# Patient Record
Sex: Female | Born: 1940 | Race: Black or African American | Hispanic: No | Marital: Married | Smoking: Former smoker
Health system: Southern US, Community
[De-identification: ages and names within clinical notes are randomized; demographics above are authoritative.]

## PROBLEM LIST (undated history)

## (undated) DIAGNOSIS — G40909 Epilepsy, unspecified, not intractable, without status epilepticus: Secondary | ICD-10-CM

## (undated) DIAGNOSIS — K921 Melena: Secondary | ICD-10-CM

## (undated) DIAGNOSIS — I10 Essential (primary) hypertension: Secondary | ICD-10-CM

## (undated) DIAGNOSIS — C801 Malignant (primary) neoplasm, unspecified: Secondary | ICD-10-CM

## (undated) DIAGNOSIS — H269 Unspecified cataract: Secondary | ICD-10-CM

## (undated) DIAGNOSIS — R7303 Prediabetes: Secondary | ICD-10-CM

## (undated) DIAGNOSIS — Z860100 Personal history of colon polyps, unspecified: Secondary | ICD-10-CM

## (undated) DIAGNOSIS — Z973 Presence of spectacles and contact lenses: Secondary | ICD-10-CM

## (undated) DIAGNOSIS — K759 Inflammatory liver disease, unspecified: Secondary | ICD-10-CM

## (undated) DIAGNOSIS — R011 Cardiac murmur, unspecified: Secondary | ICD-10-CM

## (undated) DIAGNOSIS — R079 Chest pain, unspecified: Secondary | ICD-10-CM

## (undated) DIAGNOSIS — R51 Headache: Secondary | ICD-10-CM

## (undated) DIAGNOSIS — Z803 Family history of malignant neoplasm of breast: Secondary | ICD-10-CM

## (undated) DIAGNOSIS — E785 Hyperlipidemia, unspecified: Secondary | ICD-10-CM

## (undated) HISTORY — DX: Cardiac murmur, unspecified: R01.1

## (undated) HISTORY — DX: Prediabetes: R73.03

## (undated) HISTORY — PX: TONSILLECTOMY: SUR1361

## (undated) HISTORY — DX: Epilepsy, unspecified, not intractable, without status epilepticus: G40.909

## (undated) HISTORY — DX: Family history of malignant neoplasm of breast: Z80.3

## (undated) HISTORY — DX: Personal history of colonic polyps: Z86.010

## (undated) HISTORY — PX: BUNIONECTOMY: SHX129

## (undated) HISTORY — DX: Headache: R51

## (undated) HISTORY — DX: Essential (primary) hypertension: I10

## (undated) HISTORY — DX: Melena: K92.1

## (undated) HISTORY — DX: Inflammatory liver disease, unspecified: K75.9

## (undated) HISTORY — DX: Personal history of colon polyps, unspecified: Z86.0100

## (undated) HISTORY — PX: TUBAL LIGATION: SHX77

## (undated) HISTORY — PX: ABDOMINAL HERNIA REPAIR: SHX539

## (undated) HISTORY — DX: Hyperlipidemia, unspecified: E78.5

## (undated) HISTORY — DX: Chest pain, unspecified: R07.9

---

## 1997-07-31 ENCOUNTER — Other Ambulatory Visit: Admission: RE | Admit: 1997-07-31 | Discharge: 1997-07-31 | Payer: Self-pay | Admitting: Gynecology

## 1997-11-20 ENCOUNTER — Other Ambulatory Visit: Admission: RE | Admit: 1997-11-20 | Discharge: 1997-11-20 | Payer: Self-pay | Admitting: Gynecology

## 1999-01-02 ENCOUNTER — Other Ambulatory Visit: Admission: RE | Admit: 1999-01-02 | Discharge: 1999-01-02 | Payer: Self-pay | Admitting: Gynecology

## 1999-11-04 ENCOUNTER — Encounter: Payer: Self-pay | Admitting: Neurology

## 1999-11-04 ENCOUNTER — Other Ambulatory Visit: Admission: RE | Admit: 1999-11-04 | Discharge: 1999-11-04 | Payer: Self-pay | Admitting: Gynecology

## 1999-11-04 ENCOUNTER — Ambulatory Visit (HOSPITAL_COMMUNITY): Admission: RE | Admit: 1999-11-04 | Discharge: 1999-11-04 | Payer: Self-pay | Admitting: Neurology

## 1999-12-16 ENCOUNTER — Encounter: Payer: Self-pay | Admitting: Gynecology

## 1999-12-16 ENCOUNTER — Encounter: Admission: RE | Admit: 1999-12-16 | Discharge: 1999-12-16 | Payer: Self-pay | Admitting: Gynecology

## 2000-03-02 ENCOUNTER — Other Ambulatory Visit: Admission: RE | Admit: 2000-03-02 | Discharge: 2000-03-02 | Payer: Self-pay | Admitting: Gynecology

## 2000-12-17 ENCOUNTER — Encounter: Admission: RE | Admit: 2000-12-17 | Discharge: 2000-12-17 | Payer: Self-pay | Admitting: Gynecology

## 2000-12-17 ENCOUNTER — Encounter: Payer: Self-pay | Admitting: Gynecology

## 2001-05-10 ENCOUNTER — Other Ambulatory Visit: Admission: RE | Admit: 2001-05-10 | Discharge: 2001-05-10 | Payer: Self-pay | Admitting: Gynecology

## 2001-12-29 ENCOUNTER — Encounter: Admission: RE | Admit: 2001-12-29 | Discharge: 2001-12-29 | Payer: Self-pay | Admitting: Gynecology

## 2001-12-29 ENCOUNTER — Encounter: Payer: Self-pay | Admitting: Gynecology

## 2002-09-21 ENCOUNTER — Observation Stay (HOSPITAL_COMMUNITY): Admission: EM | Admit: 2002-09-21 | Discharge: 2002-09-22 | Payer: Self-pay | Admitting: Emergency Medicine

## 2002-09-21 ENCOUNTER — Encounter: Payer: Self-pay | Admitting: Emergency Medicine

## 2003-01-03 ENCOUNTER — Encounter: Admission: RE | Admit: 2003-01-03 | Discharge: 2003-01-03 | Payer: Self-pay | Admitting: Gynecology

## 2003-01-03 ENCOUNTER — Encounter: Payer: Self-pay | Admitting: Gynecology

## 2003-10-01 ENCOUNTER — Other Ambulatory Visit: Admission: RE | Admit: 2003-10-01 | Discharge: 2003-10-01 | Payer: Self-pay | Admitting: Gynecology

## 2004-01-09 ENCOUNTER — Encounter: Admission: RE | Admit: 2004-01-09 | Discharge: 2004-01-09 | Payer: Self-pay | Admitting: Gynecology

## 2005-01-06 ENCOUNTER — Emergency Department (HOSPITAL_COMMUNITY): Admission: EM | Admit: 2005-01-06 | Discharge: 2005-01-07 | Payer: Self-pay | Admitting: Emergency Medicine

## 2005-01-06 IMAGING — CR DG CHEST 2V
2 series · 2 of 2 positions shown · non-contrast
Comparison: None.

CLINICAL DATA: Left chest pain and tingling.

CHEST - 2 VIEW  [DATE]:

[view not recorded (1 of 2)]
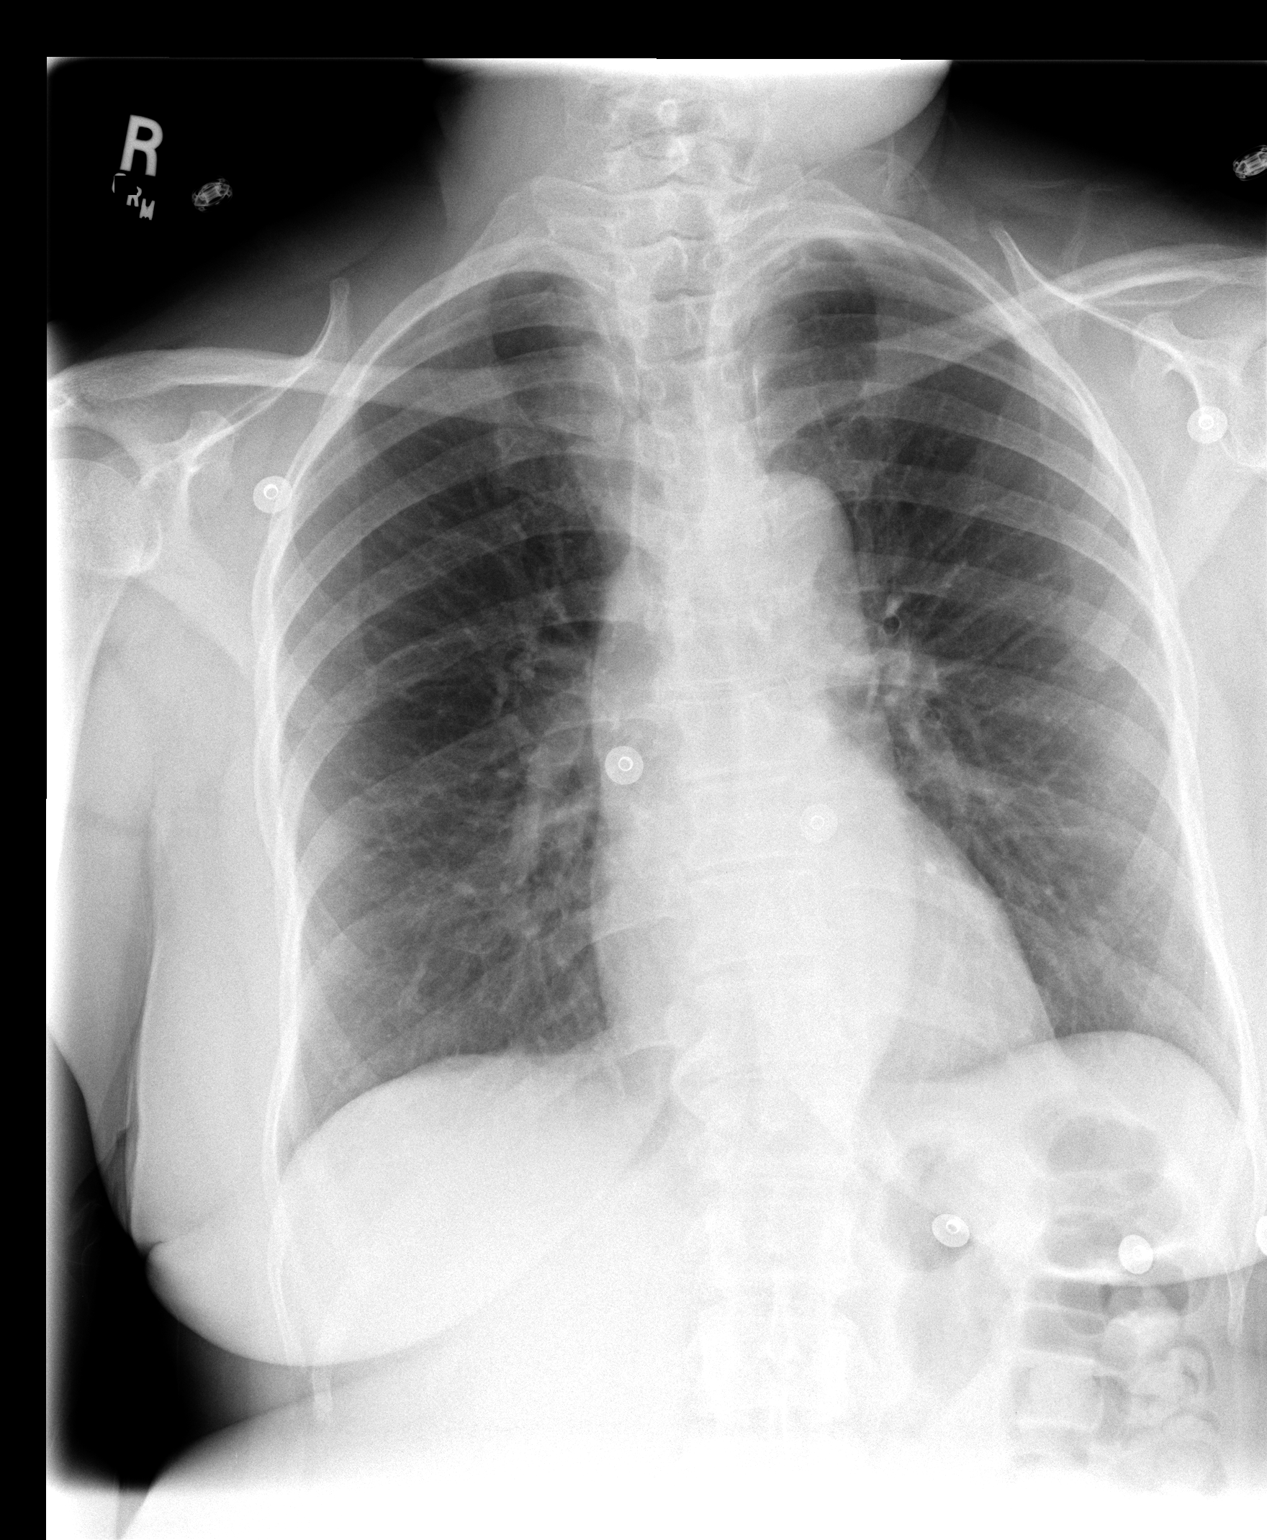

[view not recorded (2 of 2)]
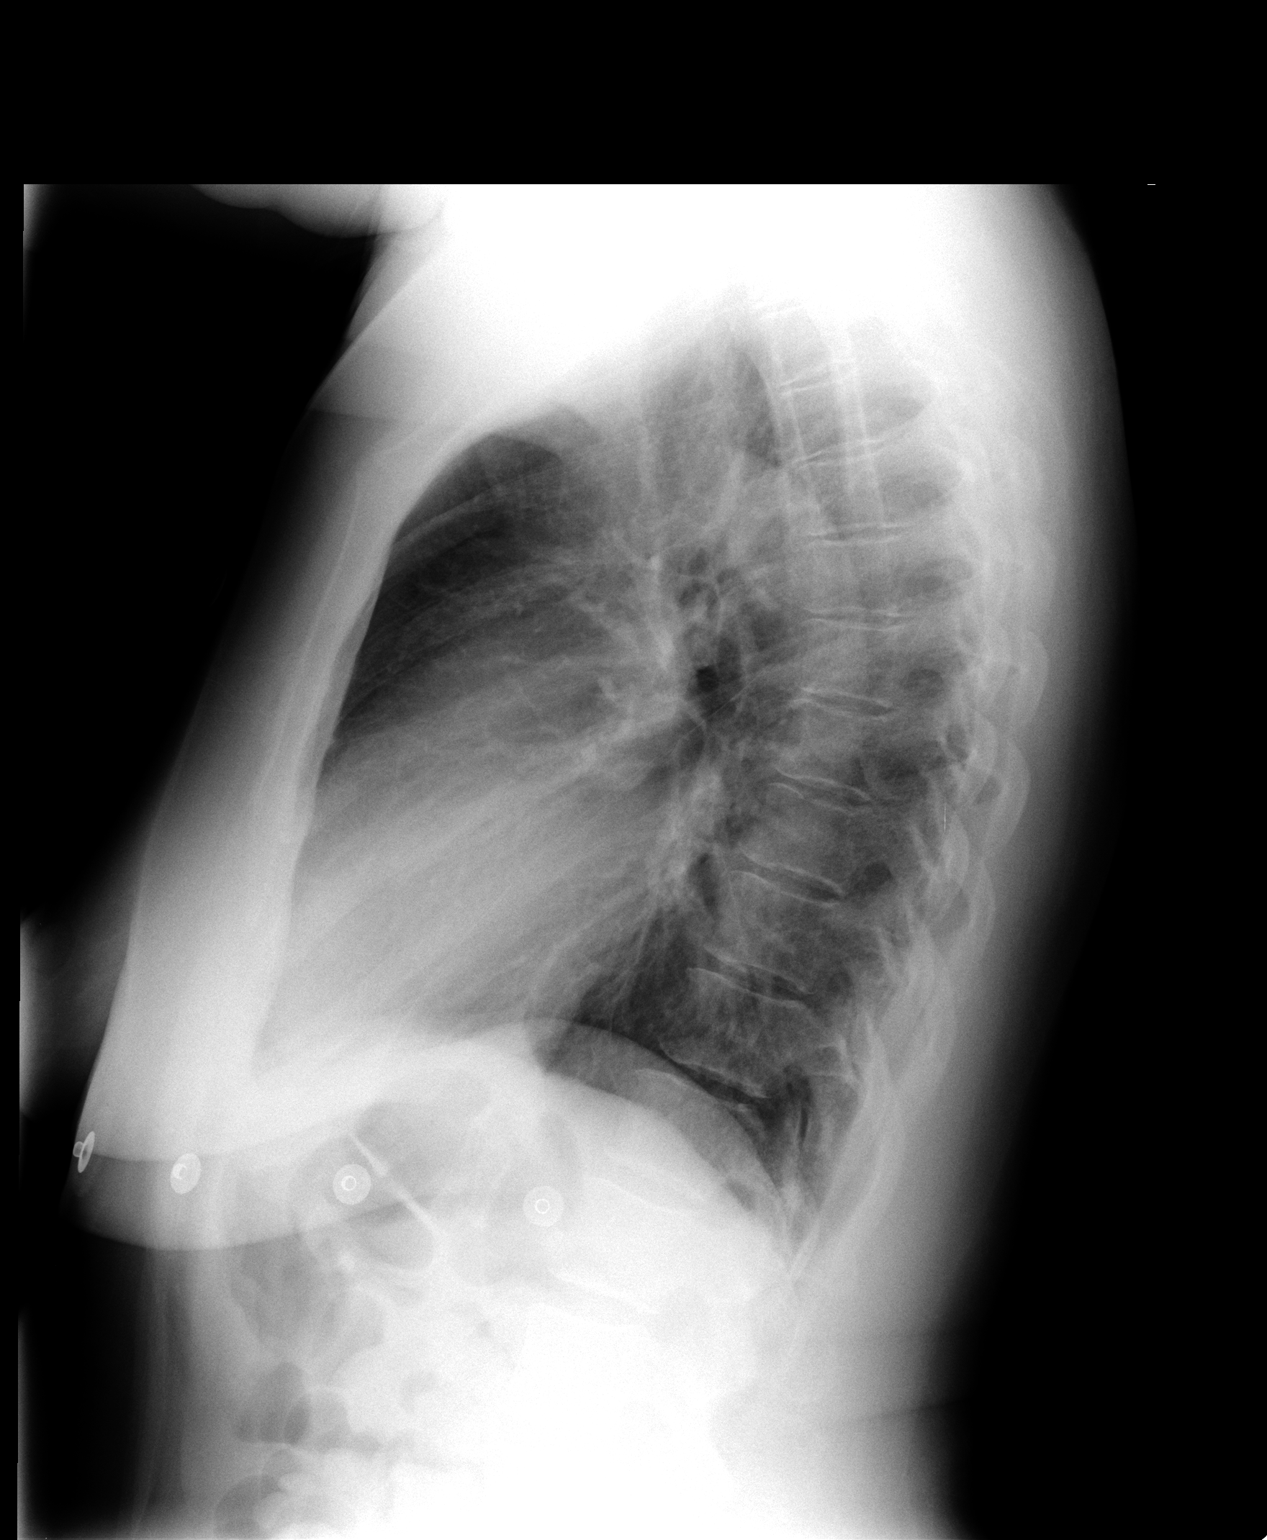

[2 of 2 positions shown; findings below may reference images not displayed]

FINDINGS: The heart size is normal. The thoracic aorta is mildly tortuous. The
hilar and mediastinal contours are otherwise unremarkable. The lungs appear
clear. There are no pleural effusions. Mild degenerative changes are present in
the midthoracic spine.
IMPRESSION: No evidence of acute disease.

## 2005-01-22 ENCOUNTER — Encounter: Admission: RE | Admit: 2005-01-22 | Discharge: 2005-01-22 | Payer: Self-pay | Admitting: Gynecology

## 2005-02-04 ENCOUNTER — Ambulatory Visit: Payer: Self-pay | Admitting: Cardiology

## 2005-03-09 ENCOUNTER — Other Ambulatory Visit: Admission: RE | Admit: 2005-03-09 | Discharge: 2005-03-09 | Payer: Self-pay | Admitting: Gynecology

## 2006-01-29 ENCOUNTER — Ambulatory Visit: Payer: Self-pay | Admitting: Cardiology

## 2006-02-18 ENCOUNTER — Encounter: Admission: RE | Admit: 2006-02-18 | Discharge: 2006-02-18 | Payer: Self-pay | Admitting: Gynecology

## 2006-02-18 IMAGING — MG MM SCREEN MAMMOGRAM BILATERAL
4 series · 4 of 4 positions shown · non-contrast
Comparison: none

DG SCREEN MAMMOGRAM BILATERAL
Bilateral CC and MLO view(s) were taken.
Prior study comparison: [DATE], bilateral screening mammogram.

DIGITAL SCREENING MAMMOGRAM WITH CAD:
There are scattered fibroglandular densities.  There is no dominant mass, architectural distortion 
or calcification to suggest malignancy.

[R CC]
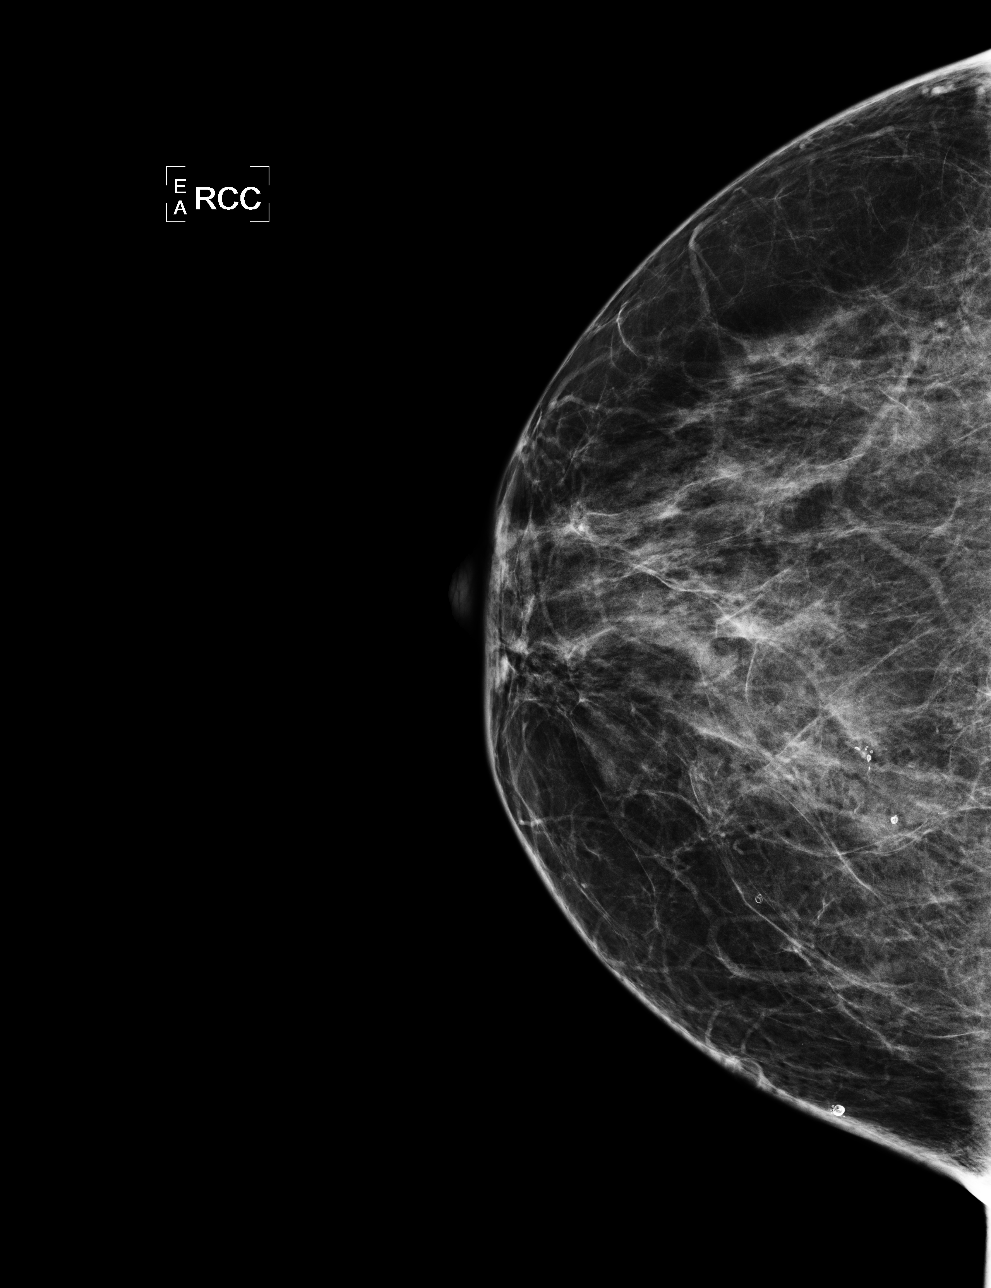

[L CC]
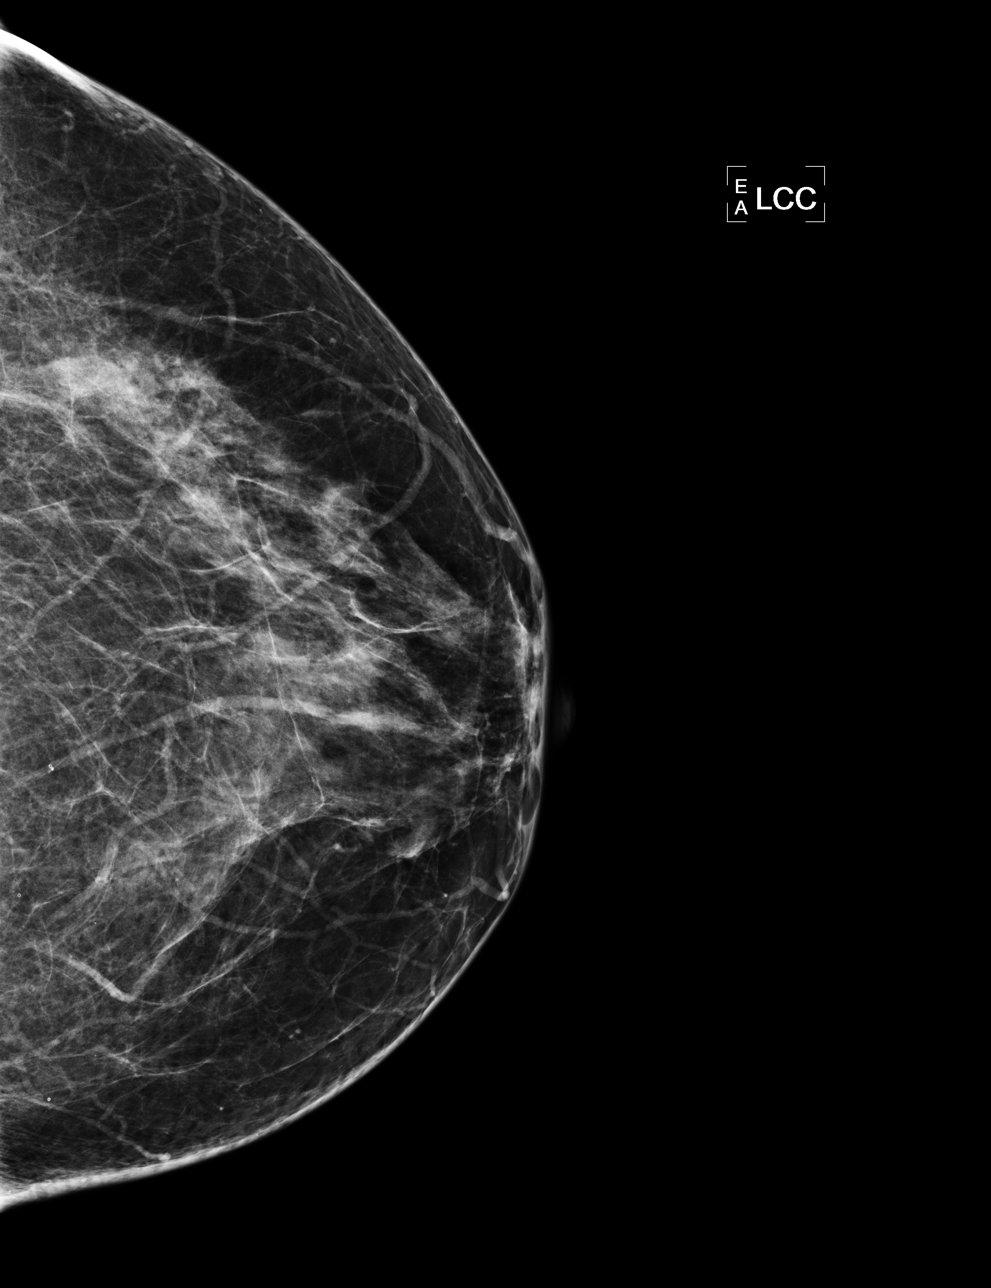

[L MLO]
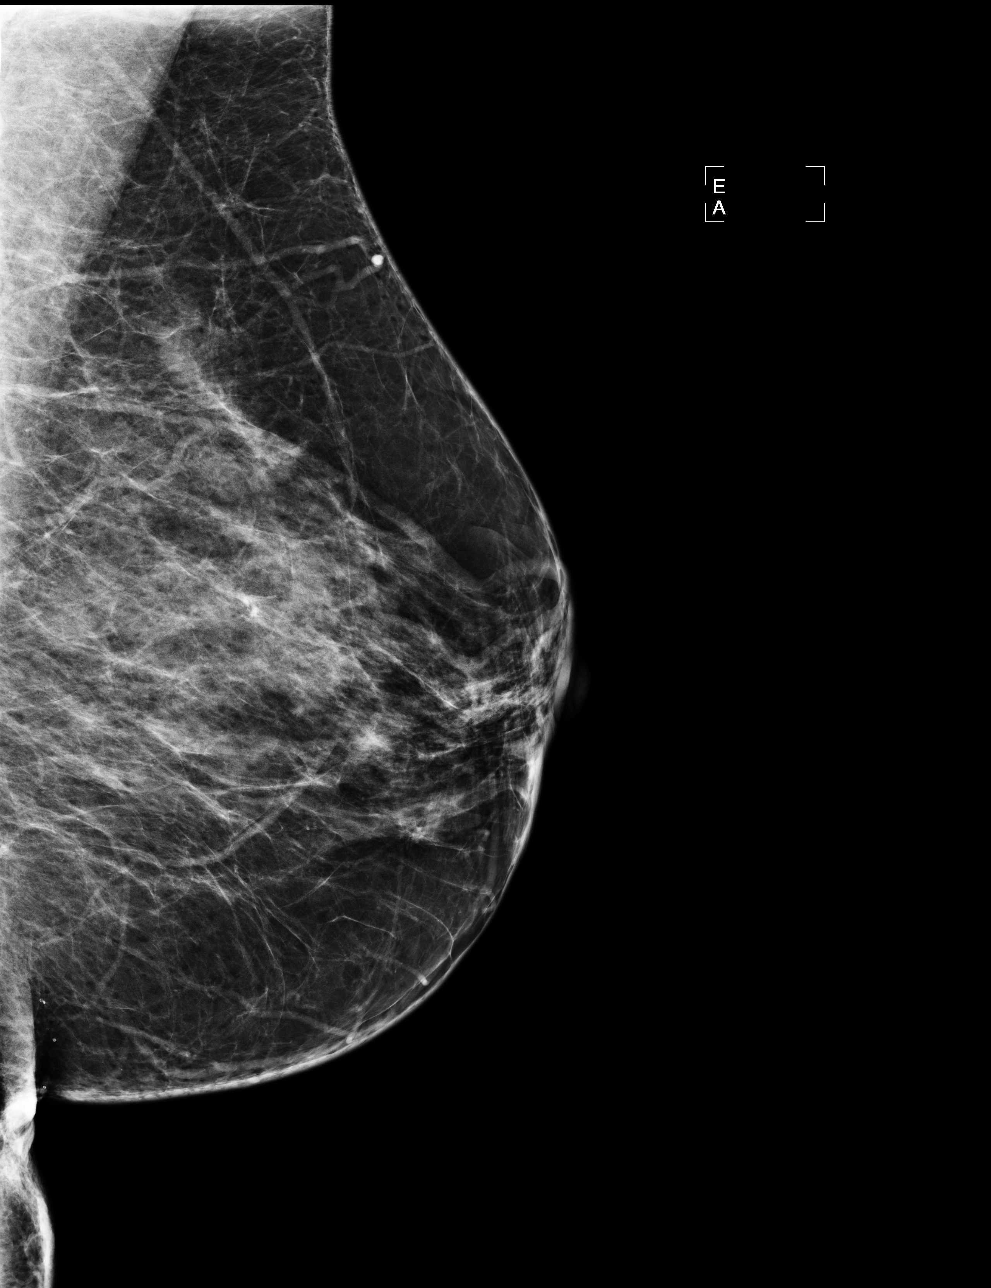

[R MLO]
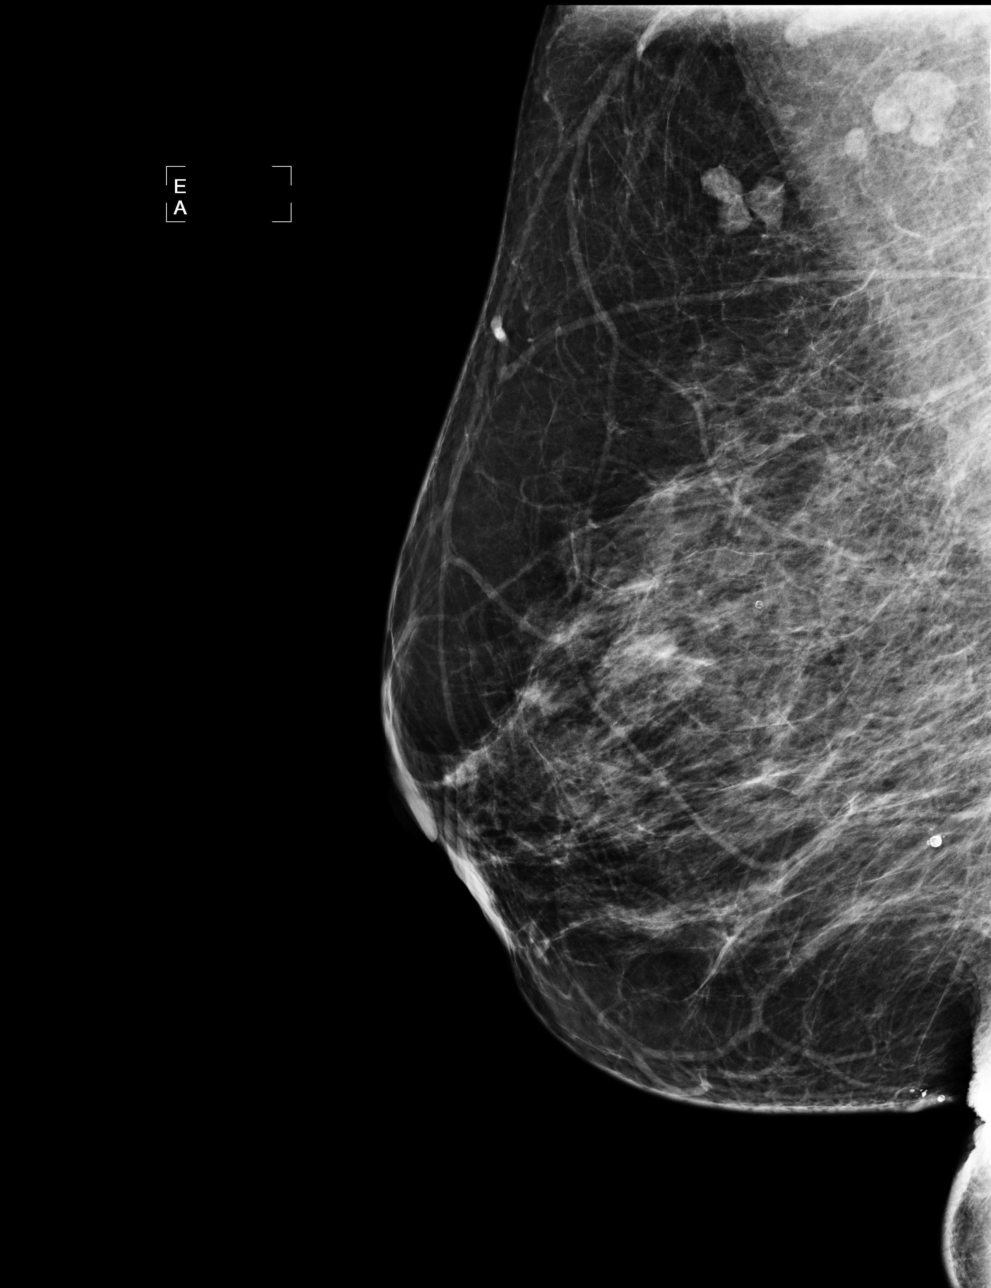

[4 of 4 positions shown; findings below may reference images not displayed]

IMPRESSION: No mammographic evidence of malignancy.  Suggest yearly screening mammography.

ASSESSMENT: Negative - BI-RADS 1

Screening mammogram in 1 year.
ANALYZED BY COMPUTER AIDED DETECTION. , THIS PROCEDURE WAS A DIGITAL MAMMOGRAM.

## 2006-04-19 ENCOUNTER — Ambulatory Visit: Payer: Self-pay | Admitting: Cardiology

## 2006-05-19 ENCOUNTER — Ambulatory Visit: Payer: Self-pay | Admitting: Cardiology

## 2006-05-19 LAB — CONVERTED CEMR LAB
BUN: 16 mg/dL (ref 6–23)
CO2: 31 meq/L (ref 19–32)
Calcium: 8.9 mg/dL (ref 8.4–10.5)
Chloride: 101 meq/L (ref 96–112)
Cholesterol: 176 mg/dL (ref 0–200)
Creatinine, Ser: 0.9 mg/dL (ref 0.4–1.2)
GFR calc Af Amer: 81 mL/min
GFR calc non Af Amer: 67 mL/min
Glucose, Bld: 77 mg/dL (ref 70–99)
HDL: 63.3 mg/dL (ref >39.0)
LDL Cholesterol: 96 mg/dL (ref 0–99)
Potassium: 3.4 meq/L — ABNORMAL LOW (ref 3.5–5.1)
Sodium: 140 meq/L (ref 135–145)
Total CHOL/HDL Ratio: 2.8
Triglycerides: 83 mg/dL (ref 0–149)
VLDL: 17 mg/dL (ref 0–40)

## 2007-01-25 ENCOUNTER — Encounter: Admission: RE | Admit: 2007-01-25 | Discharge: 2007-01-25 | Payer: Self-pay | Admitting: Gynecology

## 2007-01-25 IMAGING — MG MM DIAGNOSTIC UNILATERAL R
4 series · 4 of 4 positions shown · non-contrast
Comparison: Previous examinations.

DG DIAGNOSTIC UNILATERAL R
CC and MLO view(s) were taken of the right breast.

RIGHT BREAST ULTRASOUND
Technologist: OLESEN, Medical
DIGITAL UNILATERAL RIGHT DIAGNOSTIC MAMMOGRAM WITH CAD AND RIGHT BREAST ULTRASOUND:
CLINICAL DATA: Right axillary fullness for the past month.  Sister diagnosed with breast cancer at 
age 62.

[R CC]
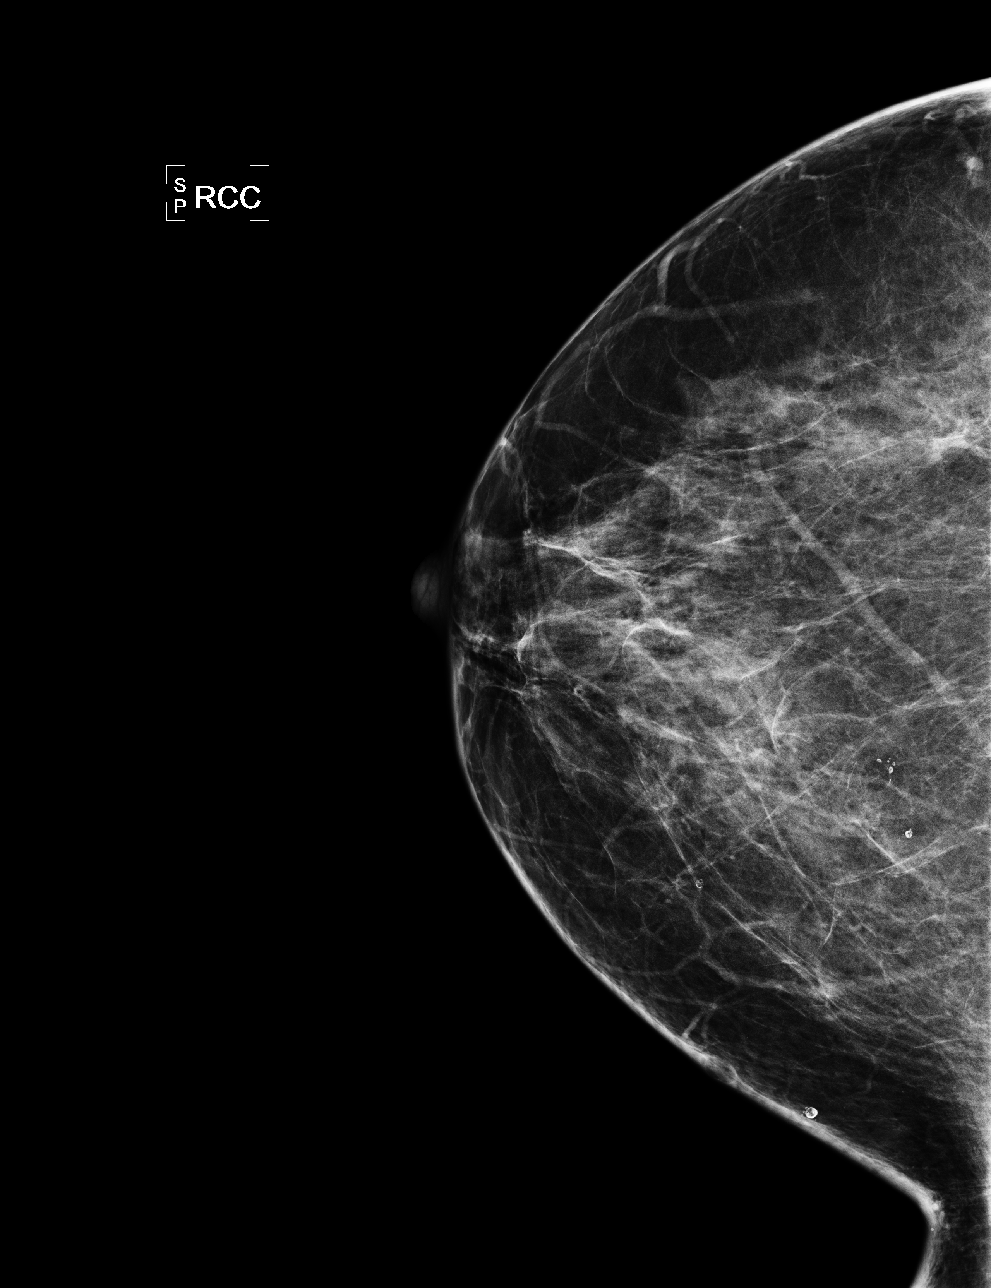

[R MLO (1 of 2)]
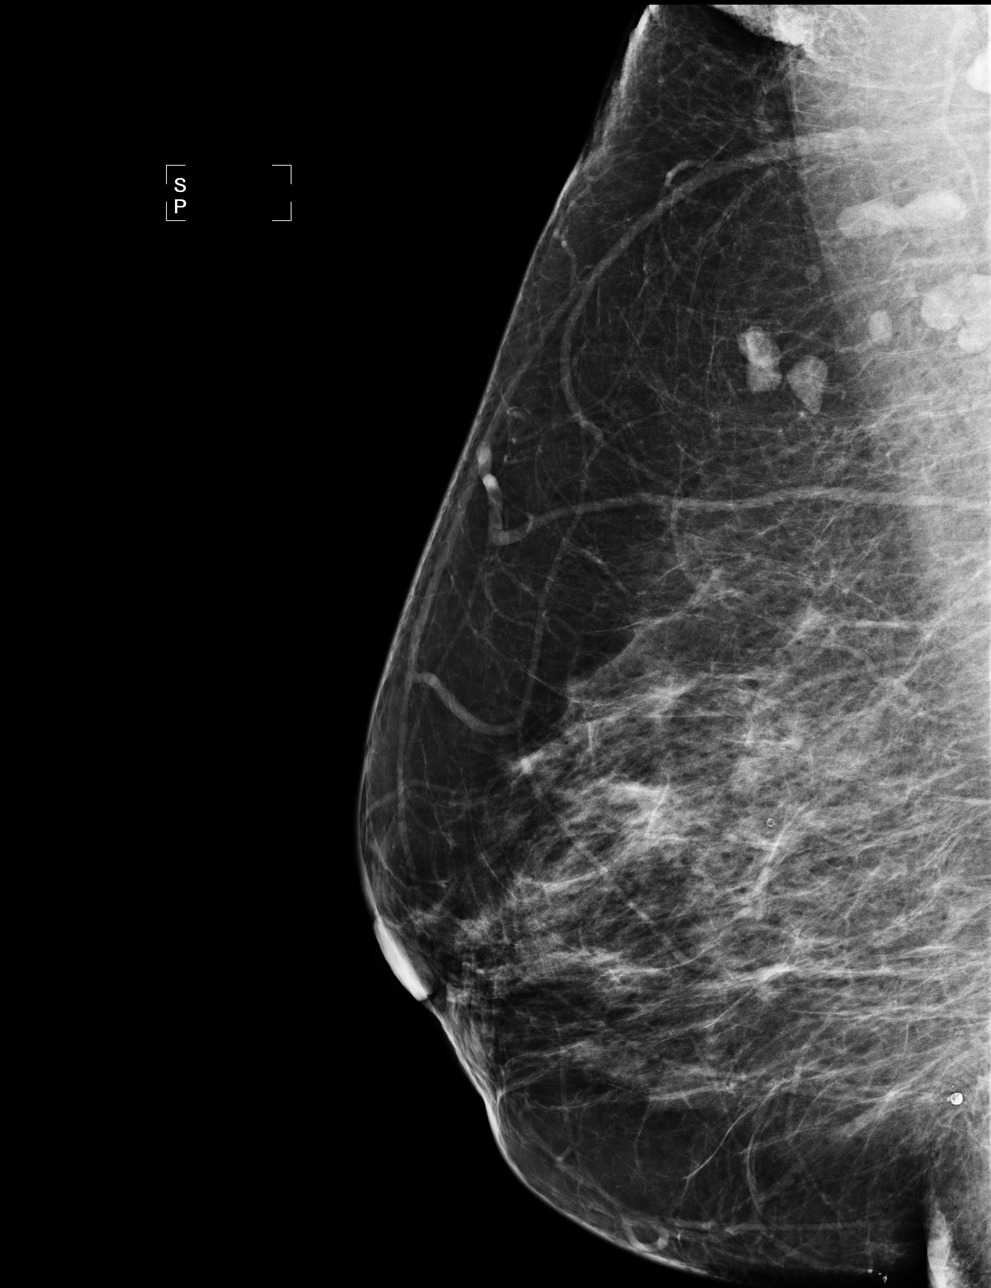

[R MLO (2 of 2)]
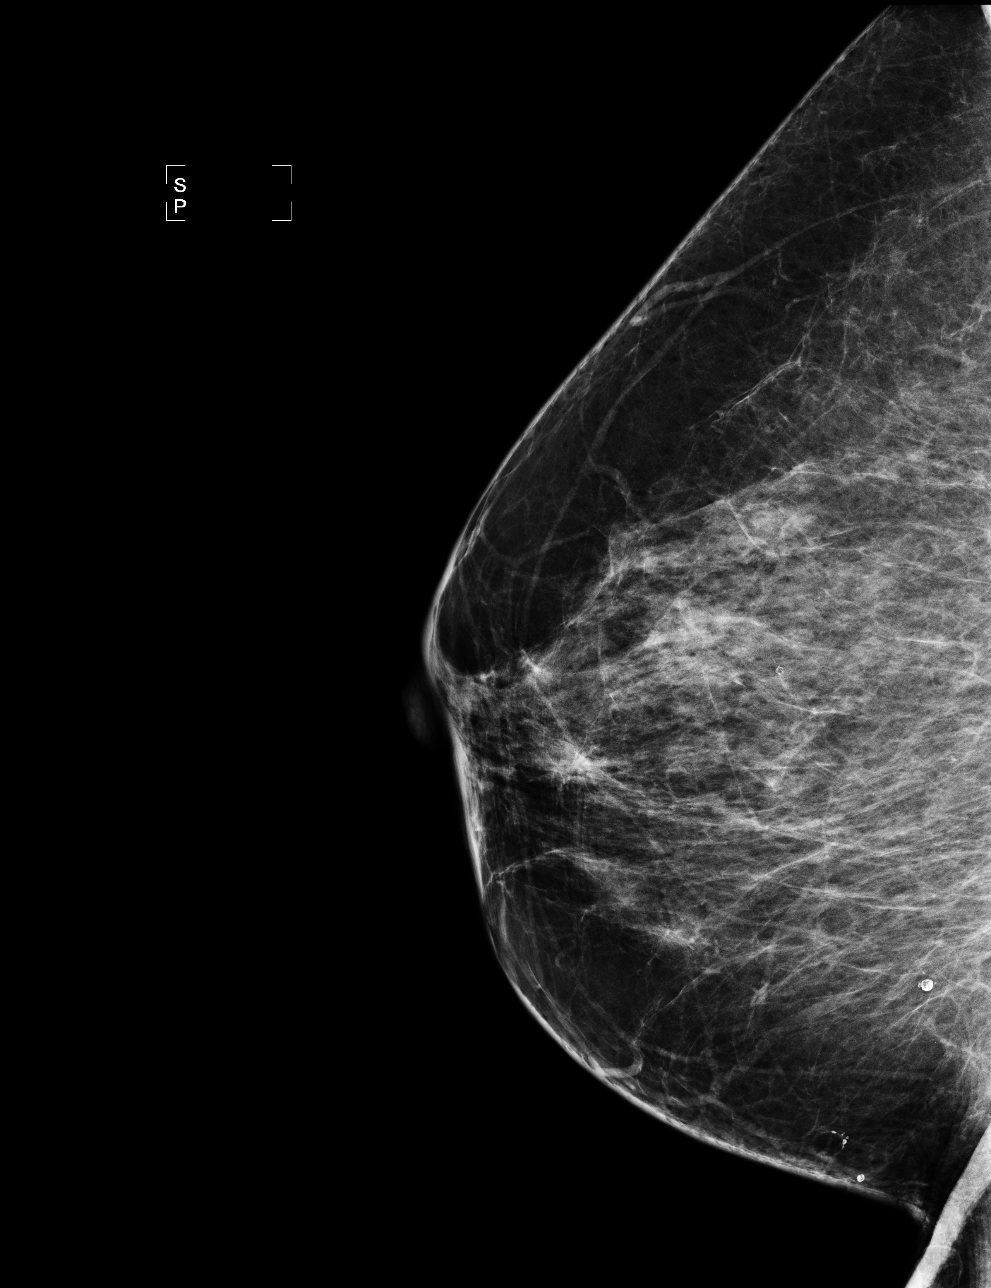

[R TAN]
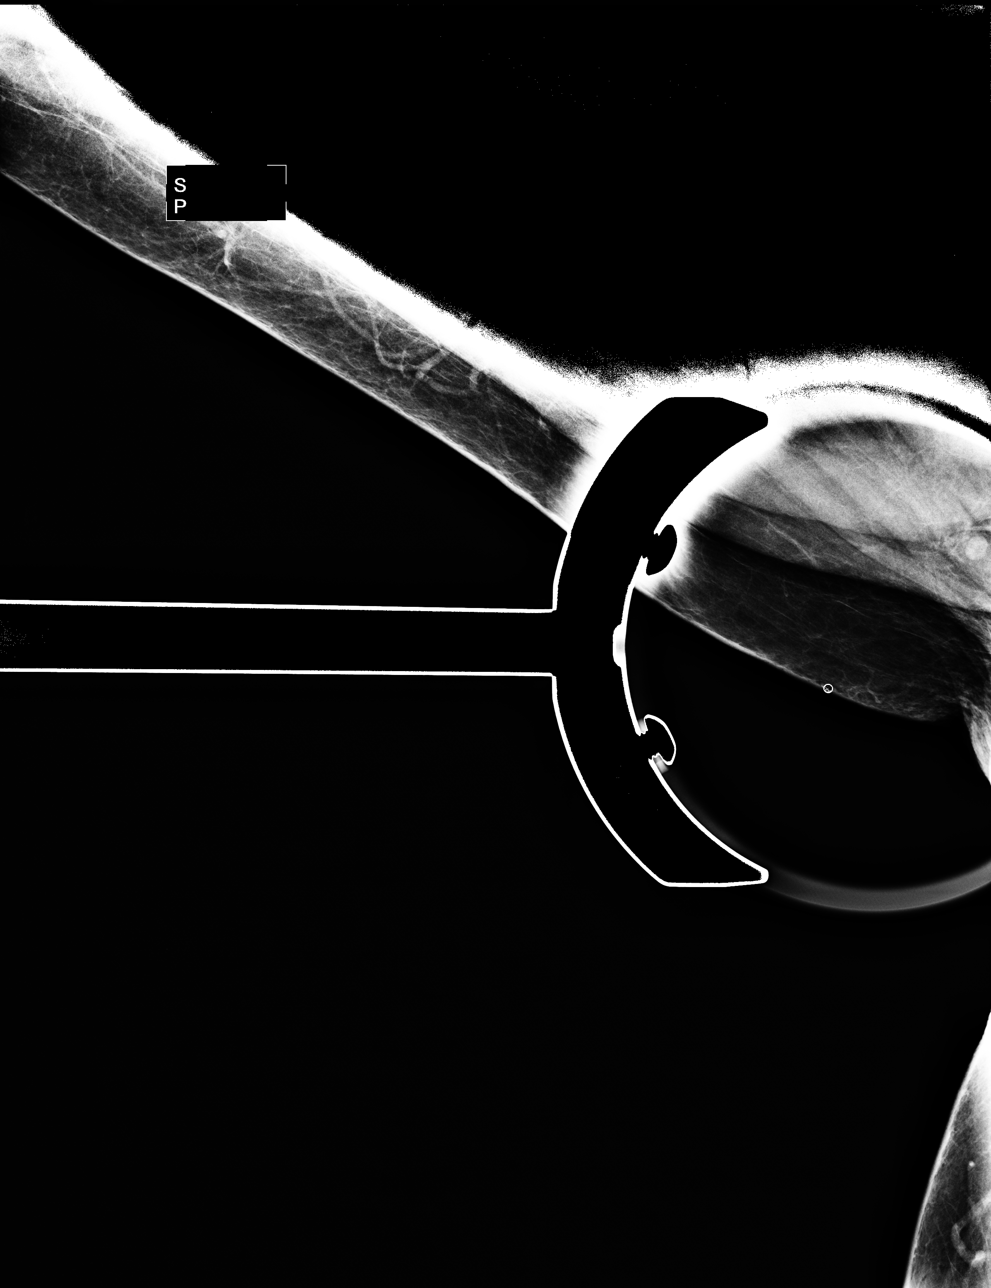

[4 of 4 positions shown; findings below may reference images not displayed]

Scattered fibroglandular tissue in the right breast is unchanged.  No abnormalities are seen in the
right axilla mammographically.  No findings suspicious for malignancy in the right breast.

Physical examination of the right axilla demonstrated no discrete palpable mass.  Sonographic 
examination of the axilla demonstrated normal-appearing fatty tissue and underlying muscle.  No 
masses or enlarged lymph nodes seen.
IMPRESSION: No evidence of malignancy.  Annual screening mammography is recommended.  The patient will be due 
for her next screening mammogram in one month.

ASSESSMENT: Negative - BI-RADS 1

Screening mammogram of both breasts in 1 month.
ANALYZED BY COMPUTER AIDED DETECTION. ,

## 2007-02-28 ENCOUNTER — Ambulatory Visit: Payer: Self-pay | Admitting: Cardiology

## 2007-11-08 ENCOUNTER — Ambulatory Visit: Payer: Self-pay | Admitting: Cardiology

## 2007-11-08 LAB — CONVERTED CEMR LAB
ALT: 15 units/L (ref 0–35)
AST: 20 units/L (ref 0–37)
Albumin: 3.9 g/dL (ref 3.5–5.2)
Alkaline Phosphatase: 124 units/L — ABNORMAL HIGH (ref 39–117)
BUN: 12 mg/dL (ref 6–23)
Bilirubin, Direct: 0.1 mg/dL (ref 0.0–0.3)
CO2: 32 meq/L (ref 19–32)
Calcium: 9.2 mg/dL (ref 8.4–10.5)
Chloride: 100 meq/L (ref 96–112)
Cholesterol: 180 mg/dL (ref 0–200)
Creatinine, Ser: 1 mg/dL (ref 0.4–1.2)
GFR calc Af Amer: 72 mL/min
GFR calc non Af Amer: 59 mL/min
Glucose, Bld: 92 mg/dL (ref 70–99)
HDL: 64.2 mg/dL (ref >39.0)
LDL Cholesterol: 101 mg/dL — ABNORMAL HIGH (ref 0–99)
Potassium: 3.2 meq/L — ABNORMAL LOW (ref 3.5–5.1)
Sodium: 140 meq/L (ref 135–145)
Total Bilirubin: 0.6 mg/dL (ref 0.3–1.2)
Total CHOL/HDL Ratio: 2.8
Total Protein: 7.7 g/dL (ref 6.0–8.3)
Triglycerides: 74 mg/dL (ref 0–149)
VLDL: 15 mg/dL (ref 0–40)

## 2007-11-28 ENCOUNTER — Ambulatory Visit: Payer: Self-pay | Admitting: Cardiology

## 2007-11-28 LAB — CONVERTED CEMR LAB
BUN: 14 mg/dL (ref 6–23)
CO2: 32 meq/L (ref 19–32)
Calcium: 8.7 mg/dL (ref 8.4–10.5)
Chloride: 108 meq/L (ref 96–112)
Creatinine, Ser: 1 mg/dL (ref 0.4–1.2)
GFR calc Af Amer: 72 mL/min
GFR calc non Af Amer: 59 mL/min
Glucose, Bld: 146 mg/dL — ABNORMAL HIGH (ref 70–99)
Potassium: 3.1 meq/L — ABNORMAL LOW (ref 3.5–5.1)
Sodium: 143 meq/L (ref 135–145)

## 2007-12-12 ENCOUNTER — Ambulatory Visit: Payer: Self-pay | Admitting: Cardiology

## 2007-12-12 LAB — CONVERTED CEMR LAB
BUN: 12 mg/dL (ref 6–23)
CO2: 32 meq/L (ref 19–32)
Calcium: 8.6 mg/dL (ref 8.4–10.5)
Chloride: 109 meq/L (ref 96–112)
Creatinine, Ser: 1 mg/dL (ref 0.4–1.2)
GFR calc Af Amer: 72 mL/min
GFR calc non Af Amer: 59 mL/min
Glucose, Bld: 90 mg/dL (ref 70–99)
Potassium: 4.1 meq/L (ref 3.5–5.1)
Sodium: 141 meq/L (ref 135–145)

## 2008-08-19 ENCOUNTER — Emergency Department (HOSPITAL_COMMUNITY): Admission: EM | Admit: 2008-08-19 | Discharge: 2008-08-19 | Payer: Self-pay | Admitting: Emergency Medicine

## 2008-09-03 ENCOUNTER — Encounter (INDEPENDENT_AMBULATORY_CARE_PROVIDER_SITE_OTHER): Payer: Self-pay | Admitting: *Deleted

## 2008-09-11 ENCOUNTER — Encounter: Admission: RE | Admit: 2008-09-11 | Discharge: 2008-09-11 | Payer: Self-pay | Admitting: Gynecology

## 2008-09-11 IMAGING — US US PELVIS COMPLETE
1 series · 14 of 25 positions shown · non-contrast
Comparison: None

CLINICAL DATA: Post menopausal bleeding.  Assess endometrium.

TRANSABDOMINAL AND TRANSVAGINAL ULTRASOUND OF PELVIS
DOPPLER ULTRASOUND OF OVARIES
TECHNIQUE: Both transabdominal and transvaginal ultrasound
examinations of the pelvis were performed including evaluation of
the uterus, ovaries, adnexal regions, and pelvic cul-de-sac. Color
and duplex Doppler ultrasound was utilized to evaluate blood flow
to the ovaries.

[Series 1: us pelvis complete · 0.27mm/px · 14 of 54 slices shown]
[im 1/54]
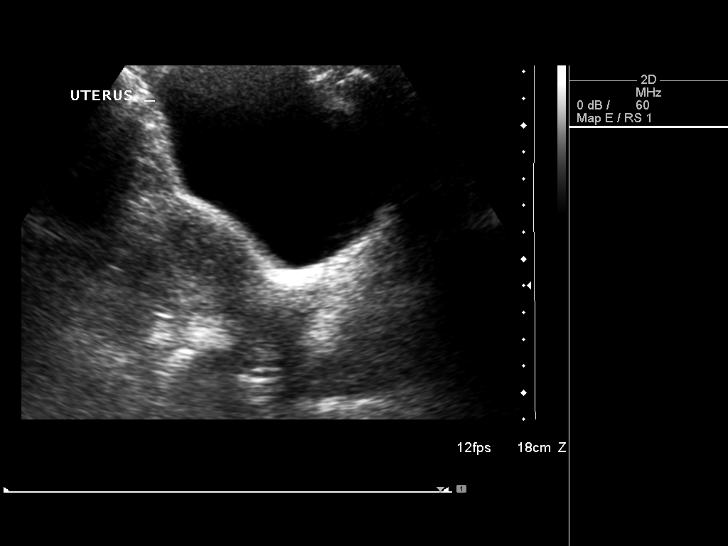
[im 5/54]
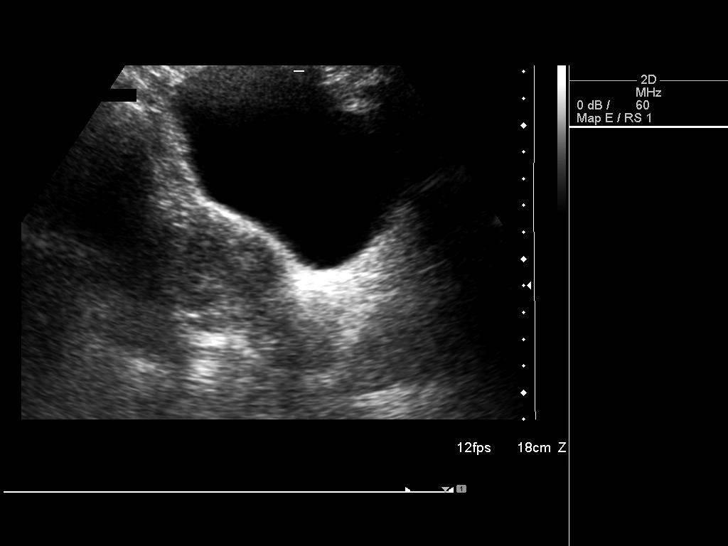
[im 9/54]
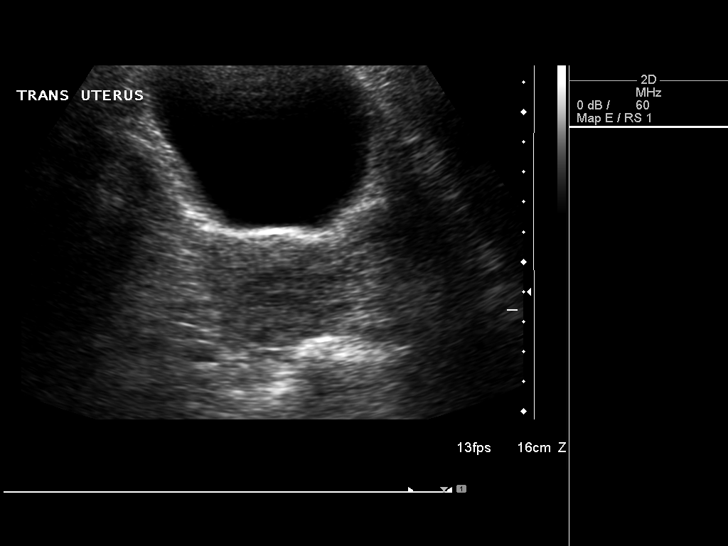
[im 14/54]
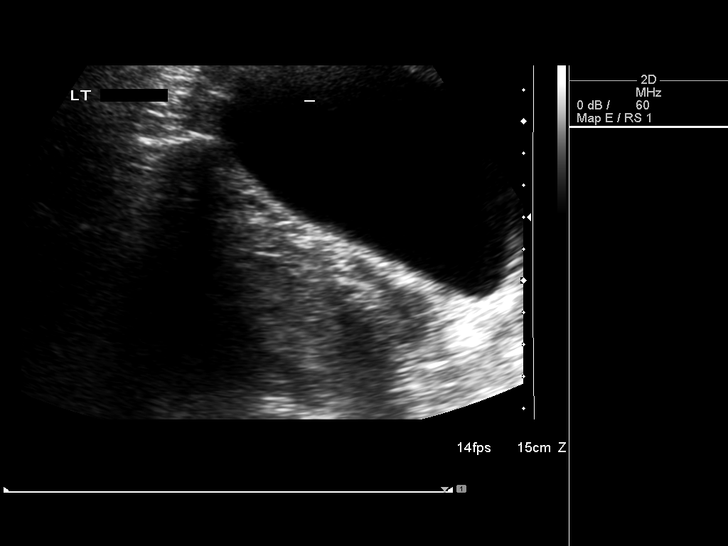
[im 18/54]
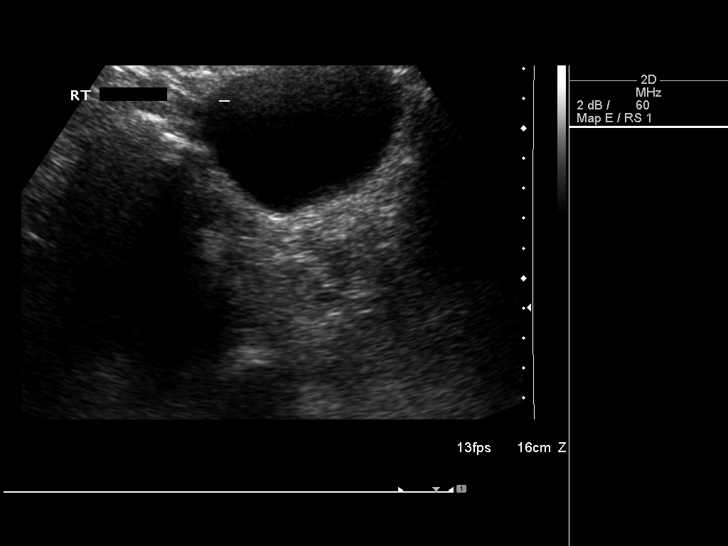
[im 20/54]
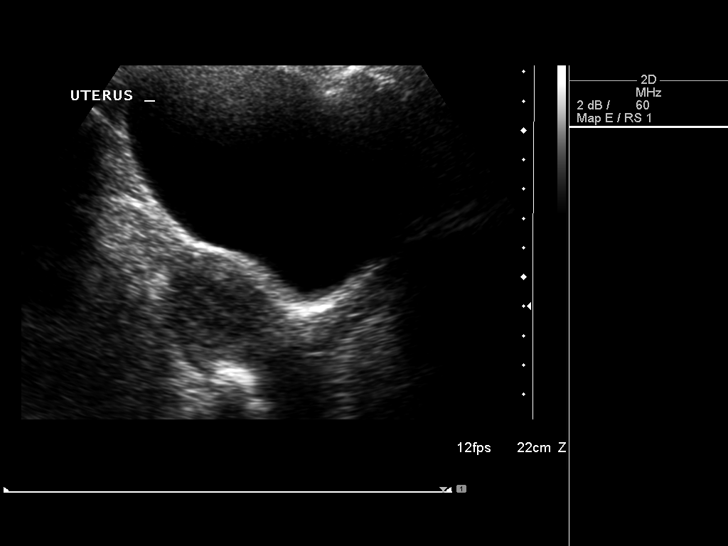
[im 25/54]
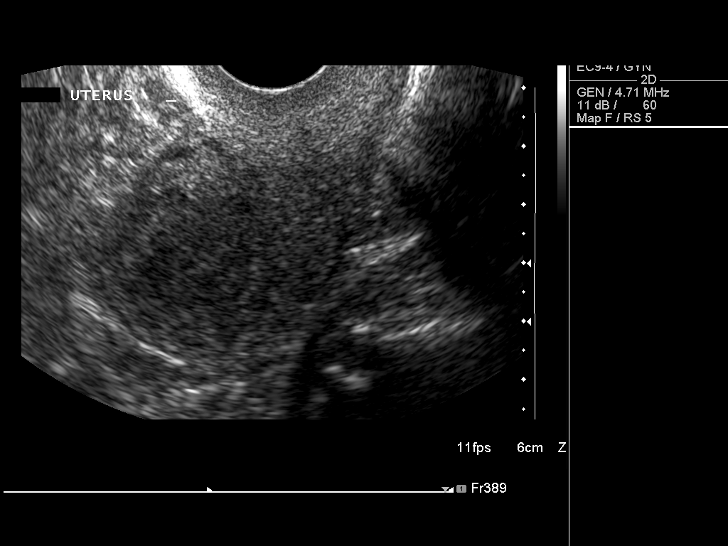
[im 29/54]
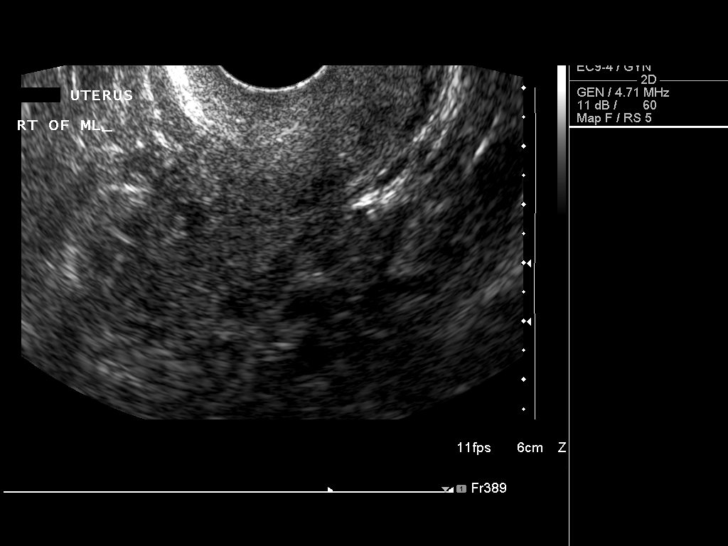
[im 34/54]
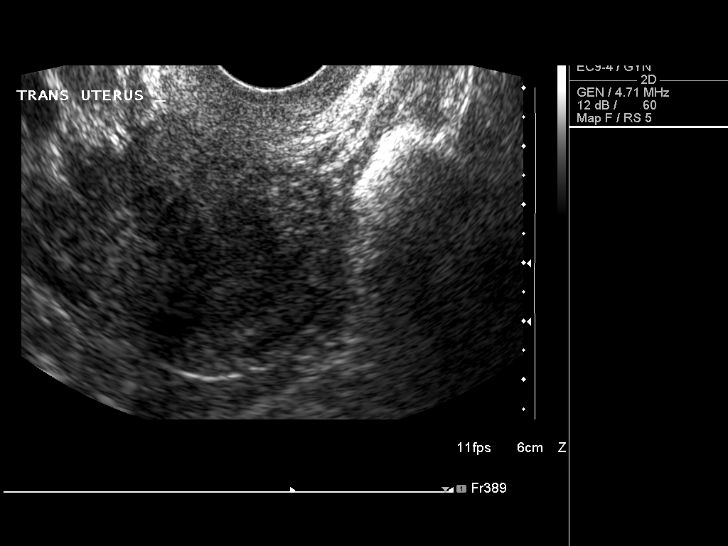
[im 36/54]
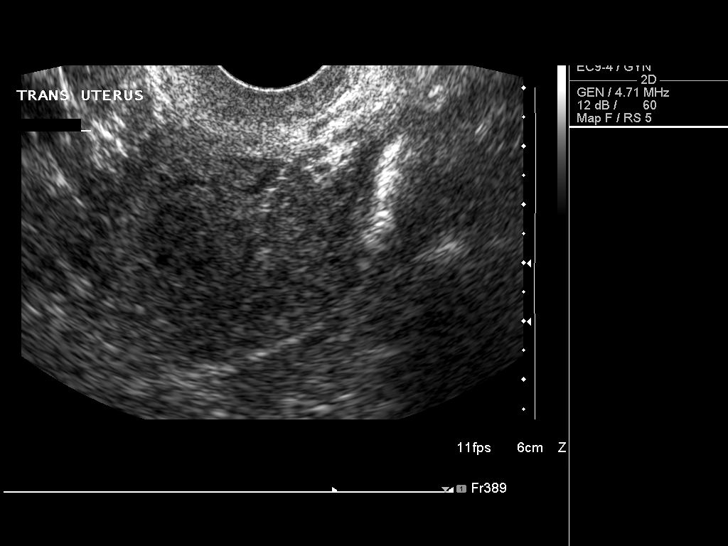
[im 40/54]
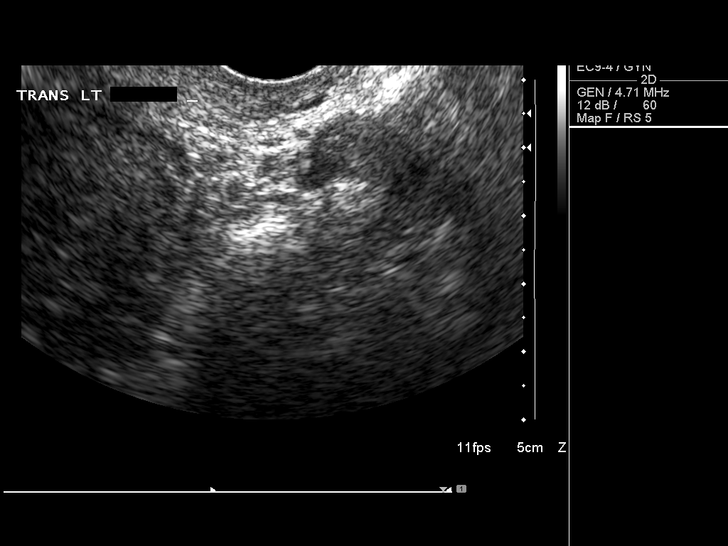
[im 45/54]
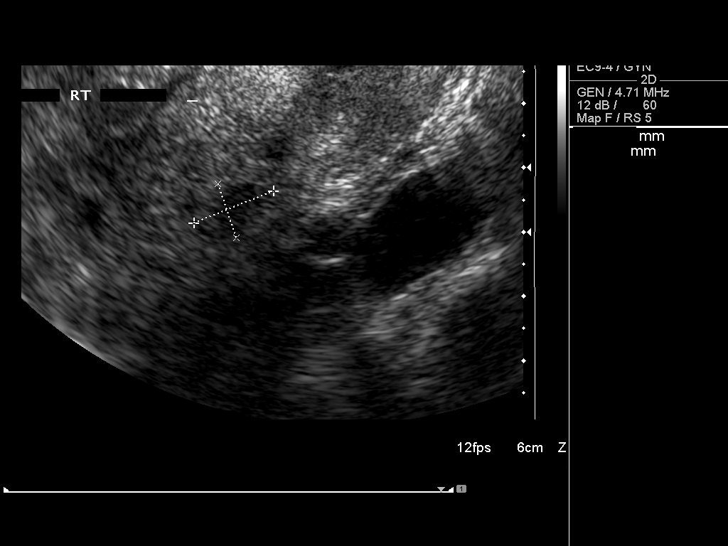
[im 49/54]
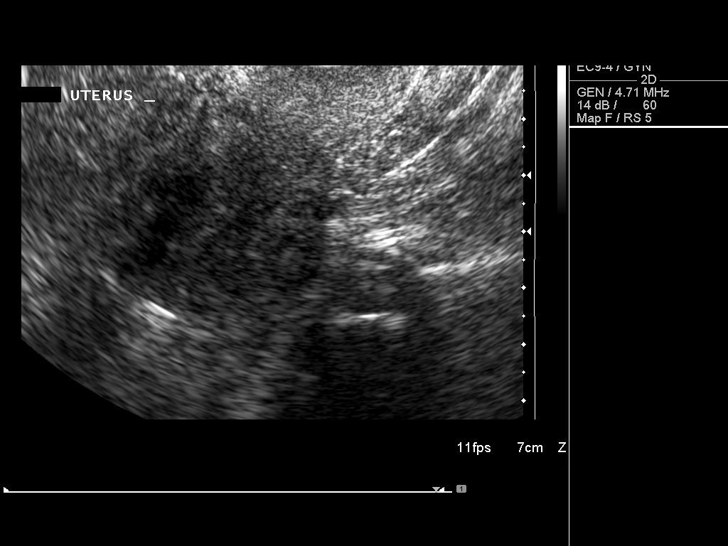
[im 54/54]
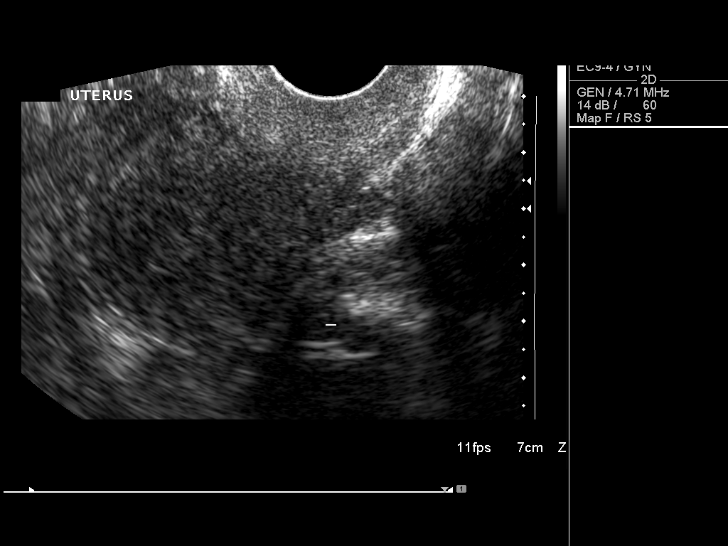

[14 of 25 positions shown; findings below may reference images not displayed]

FINDINGS: Uterus: 7.6 cm longitudinal by 3.6 cm AP by 5.2 cm right to left.
7 x 8 x 8 mm calcium fibroid dorsally.

Endometrium:  4.6 mm thickness.  No endometrial fluid.  No lobular
or irregular components apparent.

Right Ovary:  1.3 x 0.9 x 1.8 cm.  Unremarkable appearance.

Left Ovary:  2.5 x 1.5 x 1.8 cm.  Unremarkable appearance.  No
cystic or solid mass.

Other Findings:  No free fluid
IMPRESSION: No significant finding.  Normal sized post menopausal uterus with a
single small calcified fibroid within the substance of the
myometrium.  Unremarkable appearing endometrium, 4-5 mm in
thickness without lobularity or intrauterine fluid.

## 2008-11-17 DIAGNOSIS — I10 Essential (primary) hypertension: Secondary | ICD-10-CM | POA: Insufficient documentation

## 2008-11-17 DIAGNOSIS — R079 Chest pain, unspecified: Secondary | ICD-10-CM | POA: Insufficient documentation

## 2008-11-17 DIAGNOSIS — E785 Hyperlipidemia, unspecified: Secondary | ICD-10-CM | POA: Insufficient documentation

## 2008-11-26 ENCOUNTER — Ambulatory Visit: Payer: Self-pay | Admitting: Cardiology

## 2008-11-26 DIAGNOSIS — E876 Hypokalemia: Secondary | ICD-10-CM | POA: Insufficient documentation

## 2008-11-30 LAB — CONVERTED CEMR LAB
ALT: 13 units/L (ref 0–35)
AST: 19 units/L (ref 0–37)
Albumin: 3.9 g/dL (ref 3.5–5.2)
Alkaline Phosphatase: 112 units/L (ref 39–117)
BUN: 16 mg/dL (ref 6–23)
Bilirubin, Direct: 0.1 mg/dL (ref 0.0–0.3)
CO2: 32 meq/L (ref 19–32)
Calcium: 9 mg/dL (ref 8.4–10.5)
Chloride: 104 meq/L (ref 96–112)
Cholesterol: 186 mg/dL (ref 0–200)
Creatinine, Ser: 1 mg/dL (ref 0.4–1.2)
GFR calc non Af Amer: 71.35 mL/min (ref >60)
Glucose, Bld: 97 mg/dL (ref 70–99)
HDL: 72.2 mg/dL (ref >39.00)
LDL Cholesterol: 102 mg/dL — ABNORMAL HIGH (ref 0–99)
Potassium: 3.3 meq/L — ABNORMAL LOW (ref 3.5–5.1)
Sodium: 143 meq/L (ref 135–145)
Total Bilirubin: 0.4 mg/dL (ref 0.3–1.2)
Total CHOL/HDL Ratio: 3
Total Protein: 7.6 g/dL (ref 6.0–8.3)
Triglycerides: 57 mg/dL (ref 0.0–149.0)
VLDL: 11.4 mg/dL (ref 0.0–40.0)

## 2009-11-28 ENCOUNTER — Ambulatory Visit: Payer: Self-pay | Admitting: Cardiology

## 2010-05-20 NOTE — Assessment & Plan Note (Signed)
Summary: F1Y/ANAS  Medications Added NITROGLYCERIN 0.4 MG SUBL (NITROGLYCERIN) One tablet under tongue every 5 minutes as needed for chest pain---may repeat times three POTASSIUM CHLORIDE CR 10 MEQ CR-TABS (POTASSIUM CHLORIDE) Take 2 tablets daily      Allergies Added: ! PCN  Visit Type:  1 yr f/u Primary Provider:  No PCP at this time  CC:  edema/hands/ankles/feet.....denies cp or sob.  History of Present Illness: Robin Anderson returns today for evaluation and management of her history of chest pain, hypertension, and hyperlipidemia.  She offers no complaints of chest pain or having using nitroglycerin. She is compliant with her medications. Her weight is stable or improved.  Recent blood work demonstrated a total cholesterol of 186, triglycerides of 57, HDL 72, LDL 102, total cholesterol ratio ratio 3, LFTs were normal. Her potassium was 3.3. She has apparently stopped her potassium recently.  Current Medications (verified): 1)  Simvastatin 20 Mg Tabs (Simvastatin) .Marland Kitchen.. 1 Tab At Bedtime 2)  Benazepril-Hydrochlorothiazide 20-12.5 Mg Tabs (Benazepril-Hydrochlorothiazide) .Marland Kitchen.. 1 Tab Once Daily 3)  Epitol 200 Mg Tabs (Carbamazepine) .Marland Kitchen.. 1 Tab By Mouth Two Times A Day 4)  Aspirin Ec 325 Mg Tbec (Aspirin) .... Take One Tablet By Mouth Daily 5)  Nitroglycerin 0.4 Mg Subl (Nitroglycerin) .... One Tablet Under Tongue Every 5 Minutes As Needed For Chest Pain---May Repeat Times Three  Allergies (verified): 1)  ! Pcn  Past History:  Past Medical History: Last updated: 11/17/2008 CHEST PAIN-UNSPECIFIED (ICD-786.50) HYPERTENSION, UNSPECIFIED (ICD-401.9) HYPERLIPIDEMIA-MIXED (ICD-272.4)    Past Surgical History: Last updated: 11/17/2008 Tubal ligation Lower midline abdominal hernia which has reherniated. Traumatic injury to her left foot that was repaired  Family History: Last updated: 11/17/2008 No Fx Hx of premature CAD Family History of Hypertension: Mother Father:  deceased..MI Siblings: 5 sisters..3 have HTN               Brother w/HTN  Social History: Last updated: 11/17/2008 Full Time Single  Drug Use - no Alcohol Use - no Tobacco Use - Former.   Risk Factors: Smoking Status: quit (11/17/2008)  Review of Systems       negative other than history of present illness  Vital Signs:  Patient profile:   70 year old female Height:      64 inches Weight:      154 pounds BMI:     26.53 Pulse rate:   65 / minute Pulse rhythm:   regular BP sitting:   154 / 90  (left arm) Cuff size:   large  Vitals Entered By: Danielle Rankin, CMA (November 28, 2009 10:30 AM)  Physical Exam  General:  obese.   Head:  normocephalic and atraumatic Eyes:  PERRLA/EOM intact; conjunctiva and lids normal. Neck:  Neck supple, no JVD. No masses, thyromegaly or abnormal cervical nodes. Chest Laurence Folz:  no deformities or breast masses noted Lungs:  Clear bilaterally to auscultation and percussion. Heart:  PMI nondisplaced, normal S1 split S2, regular rate and rhythm, carotid upstrokes equal bilaterally without bruits Msk:  Back normal, normal gait. Muscle strength and tone normal. Pulses:  pulses normal in all 4 extremities Extremities:  No clubbing or cyanosis. Neurologic:  Alert and oriented x 3. Skin:  Intact without lesions or rashes. Psych:  Normal affect.   Impression & Recommendations:  Problem # 1:  CHEST PAIN-UNSPECIFIED (ICD-786.50) Assessment Improved  Her updated medication list for this problem includes:    Benazepril-hydrochlorothiazide 20-12.5 Mg Tabs (Benazepril-hydrochlorothiazide) .Marland Kitchen... 1 tab once daily    Aspirin Ec  325 Mg Tbec (Aspirin) .Marland Kitchen... Take one tablet by mouth daily    Nitroglycerin 0.4 Mg Subl (Nitroglycerin) ..... One tablet under tongue every 5 minutes as needed for chest pain---may repeat times three  Problem # 2:  HYPERTENSION, UNSPECIFIED (ICD-401.9)  She Did not take her medicines this morning. Her blood pressure seems to be  under good control when she checks it outside the office. No changes in meds today. Advised to take her medicine before her next visit. Her updated medication list for this problem includes:    Benazepril-hydrochlorothiazide 20-12.5 Mg Tabs (Benazepril-hydrochlorothiazide) .Marland Kitchen... 1 tab once daily    Aspirin Ec 325 Mg Tbec (Aspirin) .Marland Kitchen... Take one tablet by mouth daily  Orders: EKG w/ Interpretation (93000)  Problem # 3:  HYPERLIPIDEMIA-MIXED (ICD-272.4) Assessment: Improved  her The numbers are excellent as I reviewed with her today. No changes in medications. Return visit in a year. Her updated medication list for this problem includes:    Simvastatin 20 Mg Tabs (Simvastatin) .Marland Kitchen... 1 tab at bedtime  Orders: EKG w/ Interpretation ([address])  Problem # 4:  HYPOKALEMIA (ICD-276.8) Assessment: New  Will advise her to restart her potassium. No followup blood work needed.  Orders: EKG w/ Interpretation ([address])  Patient Instructions: 1)  Your physician recommends that you schedule a follow-up appointment in: 6 months with Dr. Daleen Squibb 2)  Your physician recommends that you continue on your current medications as directed. Please refer to the Current Medication list given to you today. Continue taking your potassium 3)  Your physician has requested that you limit the intake of sodium (salt) in your diet to two grams daily. Please see [facility] handout. 4)  PLEASE REMEMBER TO TAKE YOUR MEDICATIONS BEFORE YOUR NEXT VISIT WITH DR. Zahria Ding Prescriptions: POTASSIUM CHLORIDE CR 10 MEQ CR-TABS (POTASSIUM CHLORIDE) Take 2 tablets daily  #60 x 11   Entered by:   Lisabeth Devoid RN   Authorized by:   Gaylord Shih, MD, [facility]   Signed by:   Lisabeth Devoid RN on 11/28/2009   Method used:   Electronically to        [facility] [phone]* (retail)       [address]       [place], [place]  [address]       Ph: [phone]       Fax: [phone]   RxID:   [id number] NITROGLYCERIN 0.4 MG SUBL (NITROGLYCERIN) One  tablet under tongue every 5 minutes as needed for chest pain---may repeat times three  #25 x 9   Entered by:   Danielle Rankin, CMA   Authorized by:   Gaylord Shih, MD, [facility]   Signed by:   Danielle Rankin, CMA on 11/28/2009   Method used:   Electronically to        [facility] [phone]* (retail)       [address]       [place], [place]  [address]       Ph: [phone]       Fax: [phone]   RxID:   [id number]

## 2010-05-20 NOTE — Assessment & Plan Note (Signed)
Summary: PER CHECK OUT/SF  Medications Added EPITOL 200 MG TABS (CARBAMAZEPINE) 1 tab by mouth two times a day POTASSIUM CHLORIDE CR 10 MEQ CR-CAPS (POTASSIUM CHLORIDE) Take two tablets by mouth daily        Visit Type:  Follow-up  CC:  pt states she recently had a seziure .  History of Present Illness: Robin Anderson returns today for followup of her nonobstructive coronary artery disease, ry of ension, and hyperlipidemia. She has a history of persistent hypokalemia but cannot tolerate taking a large potassium tablets.  She also has a seizure disorder and recent had a seizure. This was while she was asleep. She says because she missed a dose of her seizure medication.  She's having no chest pain, orthopnea, PND or peripheral edema. She's lost about 5 pounds. Her blood pressure and heart rate are excellent today.  Current Medications (verified): 1)  Simvastatin 20 Mg Tabs (Simvastatin) .Marland Kitchen.. 1 Tab At Bedtime 2)  Benazepril-Hydrochlorothiazide 20-12.5 Mg Tabs (Benazepril-Hydrochlorothiazide) .Marland Kitchen.. 1 Tab Once Daily 3)  Epitol 200 Mg Tabs (Carbamazepine) .Marland Kitchen.. 1 Tab By Mouth Two Times A Day 4)  Aspirin Ec 325 Mg Tbec (Aspirin) .... Take One Tablet By Mouth Daily 5)  Nitroglycerin 0.4 Mg Subl (Nitroglycerin) .... One Tablet Under Tongue Every 5 Minutes As Needed For Chest Pain---May Repeat Times Three  Past History:  Past Medical History: Last updated: 11/17/2008 CHEST PAIN-UNSPECIFIED (ICD-786.50) HYPERTENSION, UNSPECIFIED (ICD-401.9) HYPERLIPIDEMIA-MIXED (ICD-272.4)    Past Surgical History: Last updated: 11/17/2008 Tubal ligation Lower midline abdominal hernia which has reherniated. Traumatic injury to her left foot that was repaired  Family History: Last updated: 11/17/2008 No Fx Hx of premature CAD Family History of Hypertension: Mother Father: deceased..MI Siblings: 5 sisters..3 have HTN               Brother w/HTN  Social History: Last updated: 11/17/2008 Full Time Single   Drug Use - no Alcohol Use - no Tobacco Use - Former.   Risk Factors: Smoking Status: quit (11/17/2008)  Review of Systems       negative other than the history of present illness.  Vital Signs:  Patient profile:   70 year old female Height:      64 inches Weight:      162 pounds BMI:     27.91 Pulse rate:   50 / minute Resp:     12 per minute BP sitting:   122 / 62  (left arm)  Vitals Entered By: Kem Parkinson (November 26, 2008 3:21 PM)  Physical Exam  General:  Well developed, well nourished, in no acute distress. Head:  normocephalic and atraumatic  Eyes:  PERRLA/EOM intact; conjunctiva and lids normal. Mouth:  Teeth, gums and palate normal. Oral mucosa normal. Neck:  Neck supple, no JVD. No masses, thyromegaly or abnormal cervical nodes. Lungs:  Clear bilaterally to auscultation and percussion. Heart:  Non-displaced PMI, chest non-tender; regular rate and rhythm, S1, S2 without murmurs, rubs or gallops. Carotid upstroke normal, no bruit. Normal abdominal aortic size, no bruits. Femorals normal pulses, no bruits. Pedals normal pulses. No edema, no varicosities. Msk:  Back normal, normal gait. Muscle strength and tone normal. Pulses:  pulses normal in all 4 extremities Extremities:  No clubbing or cyanosis. Neurologic:  Alert and oriented x 3. Skin:  Intact without lesions or rashes. Psych:  Normal affect.   Problems:  Medical Problems Added: 1)  Dx of Hypokalemia  (ICD-276.8) 2)  Dx of Hypokalemia  (ICD-276.8)  EKG  Procedure  date:  11/26/2008  Findings:      sinus bradycardia, normal EKG otherwise  Impression & Recommendations:  Problem # 1:  HYPERTENSION, UNSPECIFIED (ICD-401.9) Assessment Improved  Her updated medication list for this problem includes:    Benazepril-hydrochlorothiazide 20-12.5 Mg Tabs (Benazepril-hydrochlorothiazide) .Marland Kitchen... 1 tab once daily    Aspirin Ec 325 Mg Tbec (Aspirin) .Marland Kitchen... Take one tablet by mouth daily  Orders: TLB-BMP  (Basic Metabolic Panel-BMET) (80048-METABOL) TLB-Hepatic/Liver Function Pnl (80076-HEPATIC) TLB-Lipid Panel (80061-LIPID) EKG w/ Interpretation (93000)  Her updated medication list for this problem includes:    Benazepril-hydrochlorothiazide 20-12.5 Mg Tabs (Benazepril-hydrochlorothiazide) .Marland Kitchen... 1 tab once daily    Aspirin Ec 325 Mg Tbec (Aspirin) .Marland Kitchen... Take one tablet by mouth daily  Problem # 2:  HYPERLIPIDEMIA-MIXED (ICD-272.4) Assessment: Unchanged  Her updated medication list for this problem includes:    Simvastatin 20 Mg Tabs (Simvastatin) .Marland Kitchen... 1 tab at bedtime  Orders: TLB-BMP (Basic Metabolic Panel-BMET) (80048-METABOL) TLB-Hepatic/Liver Function Pnl (80076-HEPATIC) TLB-Lipid Panel (80061-LIPID) EKG w/ Interpretation (93000)  Her updated medication list for this problem includes:    Simvastatin 20 Mg Tabs (Simvastatin) .Marland Kitchen... 1 tab at bedtime  Problem # 3:  CHEST PAIN-UNSPECIFIED (ICD-786.50) Assessment: Improved  Her updated medication list for this problem includes:    Benazepril-hydrochlorothiazide 20-12.5 Mg Tabs (Benazepril-hydrochlorothiazide) .Marland Kitchen... 1 tab once daily    Aspirin Ec 325 Mg Tbec (Aspirin) .Marland Kitchen... Take one tablet by mouth daily    Nitroglycerin 0.4 Mg Subl (Nitroglycerin) ..... One tablet under tongue every 5 minutes as needed for chest pain---may repeat times three  Her updated medication list for this problem includes:    Benazepril-hydrochlorothiazide 20-12.5 Mg Tabs (Benazepril-hydrochlorothiazide) .Marland Kitchen... 1 tab once daily    Aspirin Ec 325 Mg Tbec (Aspirin) .Marland Kitchen... Take one tablet by mouth daily    Nitroglycerin 0.4 Mg Subl (Nitroglycerin) ..... One tablet under tongue every 5 minutes as needed for chest pain---may repeat times three  Problem # 4:  HYPOKALEMIA (ICD-276.8)  Complete Medication List: 1)  Simvastatin 20 Mg Tabs (Simvastatin) .Marland Kitchen.. 1 tab at bedtime 2)  Benazepril-hydrochlorothiazide 20-12.5 Mg Tabs (Benazepril-hydrochlorothiazide) .Marland Kitchen.. 1  tab once daily 3)  Epitol 200 Mg Tabs (Carbamazepine) .Marland Kitchen.. 1 tab by mouth two times a day 4)  Aspirin Ec 325 Mg Tbec (Aspirin) .... Take one tablet by mouth daily 5)  Nitroglycerin 0.4 Mg Subl (Nitroglycerin) .... One tablet under tongue every 5 minutes as needed for chest pain---may repeat times three 6)  Potassium Chloride Cr 10 Meq Cr-caps (Potassium chloride) .... Take two tablets by mouth daily  Patient Instructions: 1)  Your physician recommends that you have lipids, bmp and lft drawn today. 2)  Please take POtassium 10 Meq 2 tablets daily. 3)  Your physician wants you to follow-up in: 12 MONTHS.  You will receive a reminder letter in the mail two months in advance. If you don't receive a letter, please call our office to schedule the follow-up appointment. Prescriptions: POTASSIUM CHLORIDE CR 10 MEQ CR-CAPS (POTASSIUM CHLORIDE) Take two tablets by mouth daily  #64 x 11   Entered by:   Bernita Raisin, RN, BSN   Authorized by:   Gaylord Shih, MD, [facility]   Signed by:   Bernita Raisin, RN, BSN on 11/26/2008   Method used:   Tax adviser to        [facility] [phone]* (retail)       [address]       [place], [place]  [address]  Ph: [phone]       Fax: [phone]   RxID:   [id number]

## 2010-05-20 NOTE — Letter (Signed)
Summary: Appointment - Reminder 2  [facility], Main Office  [address]   [place], [place] [address]   Phone: [phone]  Fax: [phone]     Sep 03, 2008 MRN: [id number]   [facility] [address] [place], [place]  [address]   Dear Ms. Gangl,  Our records indicate that it is time to schedule a follow-up appointment.  Dr. Dyann Kief recommended that you follow up with [place] in July 2010. It is very important that we reach you to schedule this appointment. We look forward to participating in your health care needs. Please contact us at the number listed above at your earliest convenience to schedule your appointment.  If you are unable to make an appointment at this time, give [place] a call so we can update our records.     Sincerely,  Lorne Skeens  [facility] Scheduling Team

## 2010-07-24 ENCOUNTER — Encounter: Payer: Self-pay | Admitting: *Deleted

## 2010-07-28 ENCOUNTER — Ambulatory Visit (INDEPENDENT_AMBULATORY_CARE_PROVIDER_SITE_OTHER): Payer: Medicare Other | Admitting: Cardiology

## 2010-07-28 ENCOUNTER — Encounter: Payer: Self-pay | Admitting: Cardiology

## 2010-07-28 VITALS — BP 160/74 | HR 76 | Resp 18 | Ht 64.0 in | Wt 156.1 lb

## 2010-07-28 DIAGNOSIS — R079 Chest pain, unspecified: Secondary | ICD-10-CM

## 2010-07-28 DIAGNOSIS — I10 Essential (primary) hypertension: Secondary | ICD-10-CM

## 2010-07-28 NOTE — Assessment & Plan Note (Signed)
Improved with weight loss. No change in treatment.

## 2010-07-28 NOTE — Patient Instructions (Signed)
Your physician recommends that you schedule a follow-up appointment in: 1 year  

## 2010-07-28 NOTE — Progress Notes (Signed)
   Patient ID: Robin Anderson, female    DOB: Dec 02, 1940, 70 y.o.   MRN: [id number]  HPI Robin Anderson returns for E and M of her cardiac risk factors. Her BP has been good running 120-130/80. She recently got married. Her weight is down 12-15 lbs and she feels well. Her blood work is checked by her primary care, Dr Daphine Deutscher.    Review of Systems  All other systems reviewed and are negative.      Physical Exam  Constitutional: She is oriented to person, place, and time. She appears well-developed and well-nourished. No distress.  HENT:  Head: Normocephalic and atraumatic.  Eyes: EOM are normal. Pupils are equal, round, and reactive to light.  Neck: Normal range of motion. No JVD present. No tracheal deviation present. No thyromegaly present.  Cardiovascular: Normal rate, regular rhythm, normal heart sounds and intact distal pulses.   Pulmonary/Chest: Effort normal and breath sounds normal.  Abdominal: Soft. Bowel sounds are normal.  Musculoskeletal: She exhibits no edema.  Neurological: She is alert and oriented to person, place, and time.  Skin: Skin is warm and dry.  Psychiatric: She has a normal mood and affect.

## 2010-09-02 NOTE — Assessment & Plan Note (Signed)
[facility]                            CARDIOLOGY OFFICE NOTE   Robin Anderson, Robin Anderson                          MRN:          [id number]  DATE:11/08/2007                            DOB:          02-13-1941    PROBLEM LIST:  1. Hypertension.  Her blood pressure has been under good control and      she seems to be taking her medications.  2. Hyperlipidemia.  She was due blood work in January 2009, but did      not come to have it drawn.  3. Chest discomfort.  This has been felt to be noncardiac.  She has      had 1 episode of a knot in her left upper chest one evening, but      has not had any other symptoms since I saw her in November.  She      has no exertional symptoms to suggest angina.   Her meds were unchanged since last visit except now she is on  simvastatin 20 mg a day instead of Lipitor.   Her blood pressure today is 140/80, her pulse is 76 and regular, and  weight is 168.  HEENT, unchanged.  Carotids are full.  No bruits.  Thyroid is not enlarged.  Lungs are clear.  Heart reveals a nondisplaced  PMI.  She has normal S1 and S2.  Abdominal exam is soft, good bowel  sounds.  Extremities reveal no edema.  Pulses are present.  Neuro exam  is intact.   [name] seems to be doing well.  We will check comprehensive metabolic  panel and lipids today since she is here.  I will see her back again in  a year.     Thomas C. Daleen Squibb, MD, [facility]  Electronically Signed    TCW/MedQ  DD: 11/08/2007  DT: 11/09/2007  Job #: [id number]

## 2010-09-02 NOTE — Assessment & Plan Note (Signed)
[facility]                            CARDIOLOGY OFFICE NOTE   Robin Anderson, Robin Anderson                          MRN:          [id number]  DATE:02/28/2007                            DOB:          [birth year]    Robin Anderson comes in today for further management of the following issues:  1. Hypertension.  Unfortunately, she has been compliant with her meds      but still sounds like she is not checking her sodium grams.  She is      still eating [employer].  2. Hyperlipidemia.  Her lipids were at goal January 2008.  3. Chest discomfort.  This has been felt to be noncardiac.   She has been under a lot of stress the past year.  [employer] sold out to  [employer] and she has now been moved from [employer] to [employer].  She says it is a rough crowd there at night, and she is under a lot of  stress.  She is about ready to hang it up.   Her meds are:  1. Benazepril hydrochlorothiazide 20/12.5 daily.  2. Simvastatin 20 mg a day.  3. Carbamazepine 200 mg b.i.d.  4. Enteric-coated aspirin 325 mg a day.   Her blood pressure today is 151/88.  She says she checked it at her drug  store earlier and it was 120/80.  Pulse is 58 and regular.  She is in  sinus brady with no EKG changes.  Her weight is 164 which is up 11  pounds.  HEENT:  Normocephalic, atraumatic.  PERRLA.  Extraocular movements  intact.  Sclerae clear, facial symmetry is normal.  Carotid upstrokes were equal bilaterally without bruits, no JVD.  Thyroid is not enlarged, trachea is midline.  LUNGS:  Clear.  HEART:  Reveals nondisplaced PMI.  She has no S4 gallop.  S2 splits  physiologically.  ABDOMINAL EXAM:  Soft, good bowel sounds.  EXTREMITIES:  Reveal no cyanosis, clubbing, or edema.  Pulses are  intact.  NEURO EXAM:  Intact.   ASSESSMENT AND PLAN:  Robin Anderson seems to be doing well except watching  her sodium.  She is very compliant with her medications.  We will check  blood work in January, and I  will see her back in 6 months.     Thomas C. Daleen Squibb, MD, [facility]  Electronically Signed    TCW/MedQ  DD: 02/28/2007  DT: 03/01/2007  Job #: [id number]

## 2010-09-05 NOTE — Assessment & Plan Note (Signed)
[facility]                              CARDIOLOGY OFFICE NOTE   CARREN, BLAKLEY                          MRN:          [id number]  DATE:01/29/2006                            DOB:          [birth year]    Ms. Mccullum returns today for management of the following issues:  1. Hypertension. She admits to eating a lot of [employer] at work with a      lot of sodium.  She seems to be compliant with her benazepril/HCTZ.  2. Hyperlipidemia, at goal on Lipitor.  She would like to switch to a      generic such as simvastatin to get it from her drug store cheaper.  She      is due blood work regardless.  3. Chest discomfort.  She is having some hot flashes with non-cardiac      chest pain.   MEDICATIONS:  1. Lotensin/HCTZ 20/12.5 a day.  2. Lipitor 10 mg a day.  3. Carbamazepine 200 mg b.i.d.  4. Aspirin 325 mg a day.  5. Sublingual nitroglycerine p.r.n.   VITAL SIGNS:  Her blood pressure initially was 190/104.  It is now at 160/88  with a large cuff.  Her pulse is 66.  She is in sinus brady.  Her EKG is  stable.  Her weight is stable at 153.  HEENT:  Unremarkable.  NECK:  Carotid upstrokes are equal bilaterally without bruits.  There is no  JVD.  Thyroid is not enlarged.  Trachea is midline.  LUNGS:  Clear.  HEART:  Reveals a regular rate and rhythm.  ABDOMINAL EXAM:  Soft with good bowel sounds.  No midline bruit.  No  hepatomegaly.  EXTREMITIES:  Reveal no cyanosis, clubbing, or edema.  Pulses are brisk.  NEURO EXAM:  Intact.   ASSESSMENT AND PLAN:  Mrs. Hege is having non-cardiac chest discomfort.  She had a negative stress test several years ago.  Her blood pressure is not  under good control, at least today.  Some of this is dietary and some of it  is stress from her sister having metastatic breast cancer.  She would like  to change from Lipitor.  I have started her on Simvastatin 20 mg p.o. q.h.s.  We also wrote her generic Lotensin/HCT.  She  will need follow-up blood work  after she  has been on the Simvastatin for six weeks.  This was made very clear to her.  We will see her back in a year.       Thomas C. Daleen Squibb, MD, [facility]    TCW/MedQ  DD:  01/29/2006 DT:  01/31/2006 Job #:  [id number]

## 2010-09-05 NOTE — H&P (Signed)
NAME:  Robin Anderson, Robin Anderson                           ACCOUNT NO.:  [id number]   MEDICAL RECORD NO.:  [id number]                   PATIENT TYPE:  EMS   LOCATION:  MAJO                                 FACILITY:  [facility]   PHYSICIAN:  Jesse Sans. Wall, M.D.                DATE OF BIRTH:  1941-02-06   DATE OF ADMISSION:  09/21/2002  DATE OF DISCHARGE:                                HISTORY & PHYSICAL   HISTORY OF PRESENT ILLNESS:  I was asked by Dr. Lorre Nick in the  emergency room to evaluate Robin Anderson, a 70 year old single black female  who has been having left arm aching which has been chronic since Saturday  and also having some intermittent chest tightness over her left breast  associated with some sweating which has been occurring off and on for about  a month.   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  She works at [employer] on [address].  The chest discomfort is described as a  pressure with some sweating, but is not associated with exertion.  In fact,  she says it most likely occurs when she is at work and under stress.   She was scheduled to see me on the 14th of June, but came to the emergency  room today per Dr. 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   Her cardiac risk factors are pertinent for age, sex, obesity, sedentary  lifestyle, hypertension for about 15 years.  She does not smoke, does not  have a history of diabetes and thinks her cholesterol may be high but is not  sure.   She clearly denies any exertional chest pain or dyspnea on exertion or any  other anginal symptoms.   Her EKG in the emergency room is completely normal.  Blood tests are  pending; chest x-ray is pending.   ALLERGIES:  She is intolerant of PENICILLIN.  There is ONE OTHER DRUG THAT  SHE HAS TAKEN IN THE PAST FOR HER SEIZURE DISORDER but she cannot remember  it.   CURRENT MEDICATIONS:  1. Metoprolol 50 mg  b.i.d. for her hypertension.  2. Carbamazepine or Tegretol 200 mg b.i.d.   PAST MEDICAL HISTORY:  She had a seizure about 15 years ago.  She cannot  really describe her treatment, though she has been followed by neurology.  She does not have a primary care physician, per se, or an internist.   Her surgical history is pertinent for a tubal ligation and also a lower  midline abdominal hernia which has reherniated.  She has also had some  traumatic injury to her left foot that was repaired.  She has had no other  major medical issues or surgical issues.   SOCIAL HISTORY:  She is single and lives in [place].  She has worked at  [employer] for  20 years.  She has five children.   She does not drink and does not use any drugs.   FAMILY HISTORY:  Family history is insignificant for any premature coronary  artery disease history.   PHYSICAL EXAMINATION:  GENERAL:  Tonight, she is cold and shaking.  She is  quite nervous as well.  VITAL SIGNS:  Her initial vital signs were blood pressure of 177/90, her  pulse was 63 and sinus rhythm, respiratory rate is 18, O2 saturation is 99%.  She is afebrile.  HEENT:  Exam is unremarkable.  NECK:  Her neck exam reveals no JVD.  Carotid upstrokes are equal  bilaterally without bruits.  Her thyroid is full but nontender.  Trachea is  midline.  There is no obvious lymphadenopathy.  CHEST:  There is no chest wall tenderness to compression.  She has a  nondisplaced PMI with a normal S1 and S2, without murmur, rub or gallop.  S2  is physiologically split.  Lungs were clear to auscultation and percussion  without rub or adventitial sounds.  ABDOMEN:  Obese, making organomegaly difficult to assess.  There is no  obvious tenderness.  She has good bowel sounds.  There is no obvious  hepatomegaly.  EXTREMITIES:  Extremities reveal good pulses bilaterally, both dorsalis  pedis and posterior tibial.  There is no edema.  NEUROLOGIC:  Neuro exam is grossly  intact.   LABORATORY AND ACCESSORY CLINICAL DATA:  EKG is normal.   Chest x-ray is pending, as is blood work.   ASSESSMENT AND PLAN:  1. Chest discomfort which is somewhat atypical for coronary ischemia.  She     does have cardiac risk factors of age, hypertension, obesity and     sedentary lifestyle.  We also need to check her lipids, which she says     have been slightly elevated.  2. Hypertension, under poor control this evening.  We will continue     metoprolol and add hydrochlorothiazide 12.5 mg a day.  She may need a     triple drug, which would be a high-dose ACE inhibitor with her risk     factors.  3. Question hyperlipidemia.  Check lipids, fasting, in a.m.  4. Thyroid fullness.  Check thyroid-stimulating hormone.  5. If her cardiac enzymes are negative and her electrocardiogram is stable     in the morning, we will set her up for an outpatient exercise Cardiolite,     off of metoprolol, and schedule her to see me in the office on the 14th     of June.                                               Thomas C. Wall, M.D.    TCW/MEDQ  D:  09/21/2002  T:  09/22/2002  Job:  [id number]   cc:   Luvenia Redden, M.D.  [address]  [place]  [place]  [address]  Fax: [phone]

## 2010-10-15 ENCOUNTER — Other Ambulatory Visit: Payer: Self-pay | Admitting: Cardiology

## 2010-10-31 ENCOUNTER — Telehealth: Payer: Self-pay | Admitting: Cardiology

## 2010-10-31 NOTE — Telephone Encounter (Signed)
Faxed LOV, EKG & Stress to [facility] ([phone]).

## 2011-04-15 ENCOUNTER — Ambulatory Visit (INDEPENDENT_AMBULATORY_CARE_PROVIDER_SITE_OTHER): Payer: Medicare Other | Admitting: *Deleted

## 2011-04-15 DIAGNOSIS — E876 Hypokalemia: Secondary | ICD-10-CM

## 2011-04-15 DIAGNOSIS — Z79899 Other long term (current) drug therapy: Secondary | ICD-10-CM

## 2011-04-15 DIAGNOSIS — E785 Hyperlipidemia, unspecified: Secondary | ICD-10-CM

## 2011-04-15 DIAGNOSIS — I10 Essential (primary) hypertension: Secondary | ICD-10-CM

## 2011-04-15 LAB — BASIC METABOLIC PANEL
BUN: 20 mg/dL (ref 6–23)
CO2: 31 mEq/L (ref 19–32)
Calcium: 9.1 mg/dL (ref 8.4–10.5)
Chloride: 104 mEq/L (ref 96–112)
Creatinine, Ser: 0.9 mg/dL (ref 0.4–1.2)
GFR: 84.31 mL/min (ref >60.00)
Glucose, Bld: 101 mg/dL — ABNORMAL HIGH (ref 70–99)
Potassium: 3.6 mEq/L (ref 3.5–5.1)
Sodium: 141 mEq/L (ref 135–145)

## 2011-04-15 LAB — HEPATIC FUNCTION PANEL
ALT: 12 U/L (ref 0–35)
AST: 20 U/L (ref 0–37)
Albumin: 4 g/dL (ref 3.5–5.2)
Alkaline Phosphatase: 117 U/L (ref 39–117)
Bilirubin, Direct: 0 mg/dL (ref 0.0–0.3)
Total Bilirubin: 0.3 mg/dL (ref 0.3–1.2)
Total Protein: 7.4 g/dL (ref 6.0–8.3)

## 2011-04-15 LAB — CBC WITH DIFFERENTIAL/PLATELET
Basophils Absolute: 0 10*3/uL (ref 0.0–0.1)
Basophils Relative: 0.6 % (ref 0.0–3.0)
Eosinophils Absolute: 0.4 10*3/uL (ref 0.0–0.7)
Eosinophils Relative: 4.8 % (ref 0.0–5.0)
HCT: 35.2 % — ABNORMAL LOW (ref 36.0–46.0)
Hemoglobin: 11.6 g/dL — ABNORMAL LOW (ref 12.0–15.0)
Lymphocytes Relative: 11.2 % — ABNORMAL LOW (ref 12.0–46.0)
Lymphs Abs: 0.9 10*3/uL (ref 0.7–4.0)
MCHC: 33 g/dL (ref 30.0–36.0)
MCV: 86 fl (ref 78.0–100.0)
Monocytes Absolute: 0.4 10*3/uL (ref 0.1–1.0)
Monocytes Relative: 5.4 % (ref 3.0–12.0)
Neutro Abs: 6.1 10*3/uL (ref 1.4–7.7)
Neutrophils Relative %: 78 % — ABNORMAL HIGH (ref 43.0–77.0)
Platelets: 206 10*3/uL (ref 150.0–400.0)
RBC: 4.09 Mil/uL (ref 3.87–5.11)
RDW: 13.7 % (ref 11.5–14.6)
WBC: 7.8 10*3/uL (ref 4.5–10.5)

## 2011-04-15 LAB — LIPID PANEL
Cholesterol: 196 mg/dL (ref 0–200)
HDL: 77.9 mg/dL (ref >39.00)
LDL Cholesterol: 102 mg/dL — ABNORMAL HIGH (ref 0–99)
Total CHOL/HDL Ratio: 3
Triglycerides: 81 mg/dL (ref 0.0–149.0)
VLDL: 16.2 mg/dL (ref 0.0–40.0)

## 2011-04-16 LAB — CARBAMAZEPINE LEVEL, TOTAL: Carbamazepine Lvl: 7 ug/mL (ref 4.0–12.0)

## 2011-07-28 DIAGNOSIS — R7303 Prediabetes: Secondary | ICD-10-CM

## 2011-07-28 HISTORY — DX: Prediabetes: R73.03

## 2011-07-29 ENCOUNTER — Encounter: Payer: Self-pay | Admitting: Cardiology

## 2011-07-29 ENCOUNTER — Ambulatory Visit (INDEPENDENT_AMBULATORY_CARE_PROVIDER_SITE_OTHER): Payer: Medicare Other | Admitting: Cardiology

## 2011-07-29 VITALS — BP 154/68 | HR 64 | Ht 63.0 in | Wt 154.0 lb

## 2011-07-29 DIAGNOSIS — E785 Hyperlipidemia, unspecified: Secondary | ICD-10-CM

## 2011-07-29 DIAGNOSIS — R079 Chest pain, unspecified: Secondary | ICD-10-CM | POA: Diagnosis not present

## 2011-07-29 DIAGNOSIS — R7309 Other abnormal glucose: Secondary | ICD-10-CM

## 2011-07-29 DIAGNOSIS — R7303 Prediabetes: Secondary | ICD-10-CM | POA: Insufficient documentation

## 2011-07-29 DIAGNOSIS — I10 Essential (primary) hypertension: Secondary | ICD-10-CM | POA: Diagnosis not present

## 2011-07-29 NOTE — Assessment & Plan Note (Signed)
Numbers reviewed. Though her LDL is not less than 100, her total cholesterol to HDL ratio is 3. Will not increase dose of simvastatin.

## 2011-07-29 NOTE — Progress Notes (Signed)
HPI Robin Anderson comes in today for her cardiac risk factors including family history, hypertension, and hyperlipidemia. Her blood pressures been under excellent control. Recent blood work on simvastatin which showed an HDL of 77, LDL was greater than 100, but her total cholesterol HDL ratio was only 3.  She recently had an eye exam and was asked if she had problems with sugar. Fasting blood sugar in December was 101. We'll check hemoglobin A1c her ophthalmologist recommendation.  She has some atypical chest pain in the center of her chest. It goes away with massage. There are no other associated symptoms. She does not have exertional discomfort.  Past Medical History  Diagnosis Date  . Chest pain, unspecified   . Unspecified essential hypertension   . Other and unspecified hyperlipidemia     Current Outpatient Prescriptions  Medication Sig Dispense Refill  . aspirin 325 MG tablet Take 325 mg by mouth daily.        . benazepril-hydrochlorthiazide (LOTENSIN HCT) 20-12.5 MG per tablet TAKE ONE TABLET BY MOUTH EVERY DAY AS DIRECTED  30 tablet  9  . carbamazepine (TEGRETOL) 200 MG tablet Take 200 mg by mouth 2 (two) times daily.        . Multiple Vitamins-Minerals (ICAPS MV PO) Take by mouth daily.      . nitroGLYCERIN (NITROSTAT) 0.4 MG SL tablet Place 0.4 mg under the tongue every 5 (five) minutes as needed.        . simvastatin (ZOCOR) 20 MG tablet TAKE ONE TABLET BY MOUTH AT BEDTIME AS DIRECTED  30 tablet  9    Allergies  Allergen Reactions  . Penicillins     Family History  Problem Relation Age of Onset  . Hypertension Mother   . Heart attack Father   . Hypertension Sister     5 sisters....3 have HTN  . Hypertension Brother     History   Social History  . Marital Status: Married    Spouse Name: N/A    Number of Children: N/A  . Years of Education: N/A   Occupational History  . Not on file.   Social History Main Topics  . Smoking status: Former Games developer  . Smokeless  tobacco: Not on file  . Alcohol Use: No  . Drug Use: No  . Sexually Active: Not on file   Other Topics Concern  . Not on file   Social History Narrative   Single. Works full time.    ROS ALL NEGATIVE EXCEPT THOSE NOTED IN HPI  PE  General Appearance: well developed, well nourished in no acute distress, overweight HEENT: symmetrical face, PERRLA, good dentition  Neck: no JVD, thyromegaly, or adenopathy, trachea midline Chest: symmetric without deformity Cardiac: PMI non-displaced, RRR, normal S1, S2, no gallop or murmur Lung: clear to ausculation and percussion Vascular: all pulses full without bruits  Abdominal: nondistended, nontender, good bowel sounds, no HSM, no bruits Extremities: no cyanosis, clubbing or edema, no sign of DVT, no varicosities  Skin: normal color, no rashes Neuro: alert and oriented x 3, non-focal Pysch: normal affect  EKG Normal sinus rhythm, poor R wave progression, no change since previous ECG. BMET    Component Value Date/Time   NA 141 04/15/2011 0918   K 3.6 04/15/2011 0918   CL 104 04/15/2011 0918   CO2 31 04/15/2011 0918   GLUCOSE 101* 04/15/2011 0918   BUN 20 04/15/2011 0918   CREATININE 0.9 04/15/2011 0918   CALCIUM 9.1 04/15/2011 0918   GFRNONAA 71.35 11/26/2008  1558   GFRAA 72 12/12/2007 1034    Lipid Panel     Component Value Date/Time   CHOL 196 04/15/2011 0918   TRIG 81.0 04/15/2011 0918   HDL 77.90 04/15/2011 0918   CHOLHDL 3 04/15/2011 0918   VLDL 16.2 04/15/2011 0918   LDLCALC 102* 04/15/2011 0918    CBC    Component Value Date/Time   WBC 7.8 04/15/2011 0918   RBC 4.09 04/15/2011 0918   HGB 11.6* 04/15/2011 0918   HCT 35.2* 04/15/2011 0918   PLT 206.0 04/15/2011 0918   MCV 86.0 04/15/2011 0918   MCHC 33.0 04/15/2011 0918   RDW 13.7 04/15/2011 0918   LYMPHSABS 0.9 04/15/2011 0918   MONOABS 0.4 04/15/2011 0918   EOSABS 0.4 04/15/2011 0918   BASOSABS 0.0 04/15/2011 1610

## 2011-07-29 NOTE — Assessment & Plan Note (Signed)
Noncardiac. Reassurance given. Continue primary preventive strategies.

## 2011-07-29 NOTE — Assessment & Plan Note (Signed)
Well-controlled. No change in treatment. With glucose intolerance certainly the ACE inhibitor becomes more important.

## 2011-07-29 NOTE — Progress Notes (Signed)
Addended by: Lisabeth Devoid F on: 07/29/2011 11:30 AM   Modules accepted: Orders

## 2011-07-29 NOTE — Patient Instructions (Addendum)
Your physician wants you to follow-up in: 1 year with Dr. Daleen Squibb. You will receive a reminder letter in the mail two months in advance. If you don't receive a letter, please call our office to schedule the follow-up appointment.   Your physician recommends that you have lab work today: Hemoglobin A1C  We will call you with your results.   Basic Carbohydrate Counting Basic carbohydrate counting is a way to plan meals. It is done by counting the amount of carbohydrate in foods. Eating carbohydrates increases blood glucose (sugar) levels. People with diabetes use carbohydrate counting to help keep their blood glucose at a normal level.  Foods that have carbohydrates are starches (grains, beans, starchy vegetables) and sweets.  COUNTING CARBOHYDRATES IN FOODS The first step in counting carbohydrates is to learn how many carbohydrate servings you should have in every meal. A dietitian can plan this for you. After learning the amount of carbohydrates to include in your meal plan, you can start to choose the carbohydrate-containing foods you want to eat.  There are 2 ways to identify the amount of carbohydrates in the foods you eat.  Read the Nutrition Facts panel on food labels. All you need are 2 pieces of information from the Nutrition Facts panel to count carbohydrates this way:   Serving size.   Total carbohydrate (in grams).  Decide how many servings you will be eating. If it is 1 serving, you will be eating the amount of carbohydrate listed on the panel. If you will be eating 2 servings, you will be eating double the amount of carbohydrate listed on the panel.   Learn serving sizes. A serving size of most carbohydrate-containing foods is about 15 g. Listed below are serving sizes of common carbohydrate-containing foods:   1 slice bread.    cup unsweetened, dry cereal.    cup hot cereal.   ? cup rice.    cup mashed potatoes.   ? cup pasta.   1 cup fresh fruit.    cup canned  fruit.   1 cup milk (whole, 2%, or skim).    cup starchy vegetables (peas, corn, or potatoes).  Counting carbohydrates this way is similar to looking on the Nutrition Facts panel. Decide how many servings you will eat first. Multiply the number of servings you eat by 15 g. For example, if you have 2 cups of strawberries, you had 2 servings. That means you had 30 g of carbohydrate (2 servings x 15 g = 30 g). CALCULATING CARBOHYDRATES IN A MEAL Sample dinner  3 oz chicken breast.   ? cup Lopez rice.    cup corn.   1 cup fat-free milk.   1 cup strawberries with sugar-free whipped topping.  Carbohydrate calculation First, identify the foods that contain carbohydrate:  Rice.   Corn.   Milk.   Strawberries.  Calculate the number of servings eaten:  2 servings rice.   1 serving corn.   1 serving milk.   1 serving strawberries.  Multiply the number of servings by 15 g:  2 servings rice x 15 g = 30 g.   1 serving corn x 15 g = 15 g.   1 serving milk x 15 g = 15 g.   1 serving strawberries x 15 g = 15 g.  Add the amounts to find the total carbohydrates eaten: 30 g + 15 g + 15 g + 15 g = 75 g carbohydrate eaten at dinner. Document Released: 04/06/2005 Document Revised: 03/26/2011 Document  Reviewed: 02/20/2011 [facility] Patient Information 2012 [place], [place].

## 2011-07-29 NOTE — Assessment & Plan Note (Signed)
Check hemoglobin A1c. Patient advised to eliminate sweets as much possible and complex carbohydrates.

## 2011-07-31 LAB — HEMOGLOBIN A1C: Hgb A1c MFr Bld: 5.9 % (ref 4.6–6.5)

## 2011-08-04 DIAGNOSIS — Z79899 Other long term (current) drug therapy: Secondary | ICD-10-CM | POA: Diagnosis not present

## 2011-08-04 DIAGNOSIS — G40219 Localization-related (focal) (partial) symptomatic epilepsy and epileptic syndromes with complex partial seizures, intractable, without status epilepticus: Secondary | ICD-10-CM | POA: Diagnosis not present

## 2011-08-05 ENCOUNTER — Telehealth: Payer: Self-pay | Admitting: Cardiology

## 2011-08-05 NOTE — Telephone Encounter (Signed)
Pt calls b/c she "passed out" twice while running bath water on Sunday morning. She had cut back on her Pepsis. Pt did not go to the hospital. She saw her neurologist Dr. Enid Skeens and found out these were seizures. Her Tegretol was increased. ( weighs 150 with bp 120/80) Pt is doing well now except for the bruising on the top of her head and the back of her left knee. Mylo Red RN

## 2011-08-05 NOTE — Telephone Encounter (Signed)
New Problem:     Patient called in because she has had several falling spells and would like to consult with someone about them.  Please call back.

## 2011-08-11 ENCOUNTER — Other Ambulatory Visit: Payer: Self-pay | Admitting: Cardiology

## 2011-08-14 ENCOUNTER — Other Ambulatory Visit: Payer: Self-pay | Admitting: Cardiology

## 2011-08-14 NOTE — Telephone Encounter (Signed)
..   Requested Prescriptions   Pending Prescriptions Disp Refills  . simvastatin (ZOCOR) 20 MG tablet [Pharmacy Med Name: SIMVASTATIN 20MG     TAB] 30 tablet 10    Sig: TAKE ONE TABLET BY MOUTH EVERY DAY AT BEDTIME AS DIRECTED

## 2011-08-17 ENCOUNTER — Telehealth: Payer: Self-pay | Admitting: Cardiology

## 2011-08-17 NOTE — Telephone Encounter (Signed)
Pharmacy was called to verify rx refill date. When asked the associate stated the rx was picked up by pt today.    Sheritha Louis CMA

## 2011-08-17 NOTE — Telephone Encounter (Signed)
Refill F/U-  simvastatin (ZOCOR) 20 MG tablet  Patient calling to f/u on refill as [facility] says the RX is not ready.  Notes indicated medication was refill 4/26 last Friday.  Please return call to patient at hm# to refill

## 2012-01-13 ENCOUNTER — Encounter: Payer: Self-pay | Admitting: Internal Medicine

## 2012-05-09 DIAGNOSIS — Z79899 Other long term (current) drug therapy: Secondary | ICD-10-CM | POA: Diagnosis not present

## 2012-05-09 DIAGNOSIS — G40219 Localization-related (focal) (partial) symptomatic epilepsy and epileptic syndromes with complex partial seizures, intractable, without status epilepticus: Secondary | ICD-10-CM | POA: Diagnosis not present

## 2012-05-19 DIAGNOSIS — H10409 Unspecified chronic conjunctivitis, unspecified eye: Secondary | ICD-10-CM | POA: Diagnosis not present

## 2012-05-19 DIAGNOSIS — H2510 Age-related nuclear cataract, unspecified eye: Secondary | ICD-10-CM | POA: Diagnosis not present

## 2012-06-21 DIAGNOSIS — H2510 Age-related nuclear cataract, unspecified eye: Secondary | ICD-10-CM | POA: Diagnosis not present

## 2012-06-21 DIAGNOSIS — H353190 Nonexudative age-related macular degeneration, unspecified eye, stage unspecified: Secondary | ICD-10-CM | POA: Diagnosis not present

## 2012-07-12 ENCOUNTER — Other Ambulatory Visit: Payer: Self-pay | Admitting: Cardiology

## 2012-08-10 ENCOUNTER — Encounter: Payer: Self-pay | Admitting: Gastroenterology

## 2012-08-10 ENCOUNTER — Encounter: Payer: Self-pay | Admitting: Physician Assistant

## 2012-08-10 ENCOUNTER — Ambulatory Visit (INDEPENDENT_AMBULATORY_CARE_PROVIDER_SITE_OTHER): Payer: Medicare Other | Admitting: Physician Assistant

## 2012-08-10 VITALS — BP 142/88 | Ht 64.0 in | Wt 142.8 lb

## 2012-08-10 DIAGNOSIS — R079 Chest pain, unspecified: Secondary | ICD-10-CM | POA: Diagnosis not present

## 2012-08-10 DIAGNOSIS — I10 Essential (primary) hypertension: Secondary | ICD-10-CM

## 2012-08-10 DIAGNOSIS — K921 Melena: Secondary | ICD-10-CM

## 2012-08-10 DIAGNOSIS — R7309 Other abnormal glucose: Secondary | ICD-10-CM | POA: Diagnosis not present

## 2012-08-10 DIAGNOSIS — R7302 Impaired glucose tolerance (oral): Secondary | ICD-10-CM

## 2012-08-10 DIAGNOSIS — E785 Hyperlipidemia, unspecified: Secondary | ICD-10-CM

## 2012-08-10 LAB — BASIC METABOLIC PANEL
BUN: 16 mg/dL (ref 6–23)
CO2: 30 mEq/L (ref 19–32)
Calcium: 8.9 mg/dL (ref 8.4–10.5)
Chloride: 104 mEq/L (ref 96–112)
Creatinine, Ser: 1 mg/dL (ref 0.4–1.2)
GFR: 73.09 mL/min (ref >60.00)
Glucose, Bld: 84 mg/dL (ref 70–99)
Potassium: 3.5 mEq/L (ref 3.5–5.1)
Sodium: 140 mEq/L (ref 135–145)

## 2012-08-10 LAB — CBC WITH DIFFERENTIAL/PLATELET
Basophils Absolute: 0 10*3/uL (ref 0.0–0.1)
Basophils Relative: 0.5 % (ref 0.0–3.0)
Eosinophils Absolute: 0.3 10*3/uL (ref 0.0–0.7)
Eosinophils Relative: 6.2 % — ABNORMAL HIGH (ref 0.0–5.0)
HCT: 34.5 % — ABNORMAL LOW (ref 36.0–46.0)
Hemoglobin: 11.3 g/dL — ABNORMAL LOW (ref 12.0–15.0)
Lymphocytes Relative: 17.4 % (ref 12.0–46.0)
Lymphs Abs: 1 10*3/uL (ref 0.7–4.0)
MCHC: 32.7 g/dL (ref 30.0–36.0)
MCV: 85.4 fl (ref 78.0–100.0)
Monocytes Absolute: 0.4 10*3/uL (ref 0.1–1.0)
Monocytes Relative: 6.8 % (ref 3.0–12.0)
Neutro Abs: 3.8 10*3/uL (ref 1.4–7.7)
Neutrophils Relative %: 69.1 % (ref 43.0–77.0)
Platelets: 186 10*3/uL (ref 150.0–400.0)
RBC: 4.04 Mil/uL (ref 3.87–5.11)
RDW: 13.6 % (ref 11.5–14.6)
WBC: 5.5 10*3/uL (ref 4.5–10.5)

## 2012-08-10 LAB — HEPATIC FUNCTION PANEL
ALT: 12 U/L (ref 0–35)
AST: 16 U/L (ref 0–37)
Albumin: 3.9 g/dL (ref 3.5–5.2)
Alkaline Phosphatase: 103 U/L (ref 39–117)
Bilirubin, Direct: 0.1 mg/dL (ref 0.0–0.3)
Total Bilirubin: 0.6 mg/dL (ref 0.3–1.2)
Total Protein: 7 g/dL (ref 6.0–8.3)

## 2012-08-10 LAB — HEMOGLOBIN A1C: Hgb A1c MFr Bld: 5.8 % (ref 4.6–6.5)

## 2012-08-10 LAB — TSH: TSH: 1.29 u[IU]/mL (ref 0.35–5.50)

## 2012-08-10 LAB — LIPID PANEL
Cholesterol: 185 mg/dL (ref 0–200)
HDL: 69.1 mg/dL (ref >39.00)
LDL Cholesterol: 106 mg/dL — ABNORMAL HIGH (ref 0–99)
Total CHOL/HDL Ratio: 3
Triglycerides: 51 mg/dL (ref 0.0–149.0)
VLDL: 10.2 mg/dL (ref 0.0–40.0)

## 2012-08-10 MED ORDER — FAMOTIDINE 10 MG PO TABS: 10.0000 mg | ORAL_TABLET | Freq: Two times a day (BID) | ORAL | Status: DC

## 2012-08-10 NOTE — Progress Notes (Signed)
[address] [place], [place]  [address] Phone: [phone] Fax:  [phone]  Date:  08/10/2012   ID:  Robin Anderson, DOB Nov 13, 1940, MRN [id number]  PCP:  No primary provider on file.  Primary Cardiologist:  Dr. Valera Castle     History of Present Illness: Robin Anderson is a 72 y.o. female who returns for f/u.  She has a hx of atypical CP, HTN, HL, glucose intolerance.  Last seen by Dr. Valera Castle 07/2011.  Since last seen, she has dieted and lost 12 lbs.  She continues to have L sided chest pain.  This typically occurs after eating.  She has not tried antacids.  No exertional CP.  She denies significant dyspnea.  She is Class II.  No orthopnea, PND, edema.  No syncope.    Labs (12/12):  K 3.6, Cr 0.9, ALT 12, LDL 102, Hgb 11.6, A1c 5.9  Wt Readings from Last 3 Encounters:  08/10/12 142 lb 12.8 oz (64.774 kg)  07/29/11 154 lb (69.854 kg)  07/28/10 156 lb 1.9 oz (70.816 kg)     Past Medical History  Diagnosis Date  . Chest pain, unspecified   . HTN (hypertension)   . HLD (hyperlipidemia)   . Seizure disorder   . Colon polyps     Current Outpatient Prescriptions  Medication Sig Dispense Refill  . aspirin 325 MG tablet Take 325 mg by mouth daily.        . benazepril-hydrochlorthiazide (LOTENSIN HCT) 20-12.5 MG per tablet TAKE ONE TABLET BY MOUTH EVERY DAY AS DIRECTED  30 tablet  11  . carbamazepine (TEGRETOL) 200 MG tablet Take 300 mg by mouth 2 (two) times daily.       . Multiple Vitamins-Minerals (ICAPS MV PO) Take by mouth daily.      . nitroGLYCERIN (NITROSTAT) 0.4 MG SL tablet Place 0.4 mg under the tongue every 5 (five) minutes as needed.        . simvastatin (ZOCOR) 20 MG tablet TAKE ONE TABLET BY MOUTH EVERY DAY AT BEDTIME AS DIRECTED  30 tablet  0   No current facility-administered medications for this visit.    Allergies:    Allergies  Allergen Reactions  . Penicillins     Social History:  The patient  reports that she has quit smoking.  She does not have any smokeless tobacco history on file. She reports that she does not drink alcohol or use illicit drugs.   ROS:  Please see the history of present illness.   She notes BRBPR 2 mos ago.  no melena.  No fevers.  No significant cough.   All other systems reviewed and negative.   PHYSICAL EXAM: VS:  BP 142/88  Ht 5\' 4"  (1.626 m)  Wt 142 lb 12.8 oz (64.774 kg)  BMI 24.5 kg/m2 Well nourished, well developed, in no acute distress HEENT: normal Neck: no JVD Vascular:  No carotid bruits, DP/PT 2+ bilaterally Cardiac:  normal S1, S2; RRR; no murmur Lungs:  clear to auscultation bilaterally, no wheezing, rhonchi or rales Abd: soft, nontender, no hepatomegaly Ext: no edema Skin: warm and dry Neuro:  CNs 2-12 intact, no focal abnormalities noted  EKG:  Sinus brady, HR 54, normal axis, no changes from prior tracing     ASSESSMENT AND PLAN:  1. Chest Pain:  Atypical.  It is unchanged over 1+ years.  Suspect GI etiology.  Recommend Pepcid OTC prn.  No ischemic evaluation necessary.  2. Hypertension:  Fair control.  No meds yet today.  Continue current Rx.  Check BMET and TSH today. 3. Hyperlipidemia:  Check Lipids and LFTs today.  Continue statin. 4. Hematochezia:  No colonoscopy in many years.  I cannot find last report in chart.  She states she had polyps in the past.  Check CBC.  Refer to GI. 5. Glucose Intolerance:  Check Hgb A1c today. 6. Disposition:  Refer to PCP.  F/u here in 1 year.   Luna Glasgow, PA-C  10:38 AM 08/10/2012

## 2012-08-10 NOTE — Patient Instructions (Addendum)
START OTC PEPCID 10 1 TAB TWICE DAILY   LABS TODAY; BMET, CBC W/DIFF, TSH, LFT, LIPID, HGB A1C  You have been referred to INTERNAL MEDICINE DX GLUCOSE INTOLERANCE; [facility] [address],  You have been referred to GASTROENTEROLOGY DX HEMATOCHEZIA  PREVIOUS DR. WALL PT AND NEEDS FOLLOW UP IN 1 YEAR

## 2012-08-11 ENCOUNTER — Telehealth: Payer: Self-pay | Admitting: *Deleted

## 2012-08-11 NOTE — Telephone Encounter (Signed)
Message copied by Tarri Fuller on Thu Aug 11, 2012  8:49 AM ------      Message from: [place], [place] T      Created: Wed Aug 10, 2012  5:11 PM       K+ and creatinine ok      Hgb stable x 1 year      TSH normal      LFTs normal      Lipids look good      A1c stable - no sign of DM2      Continue with current treatment plan.      Tereso Newcomer, PA-C  5:11 PM 08/10/2012 ------

## 2012-08-11 NOTE — Telephone Encounter (Signed)
lmom labs ok no changes to be made 

## 2012-08-11 NOTE — Telephone Encounter (Signed)
no machine at home #, voice mail not set up yet on mobile. I will try again later to reach pt.

## 2012-08-12 ENCOUNTER — Other Ambulatory Visit: Payer: Self-pay | Admitting: *Deleted

## 2012-08-12 MED ORDER — BENAZEPRIL-HYDROCHLOROTHIAZIDE 20-12.5 MG PO TABS: ORAL_TABLET | ORAL | Status: DC

## 2012-08-12 MED ORDER — SIMVASTATIN 20 MG PO TABS: ORAL_TABLET | ORAL | Status: DC

## 2012-08-16 ENCOUNTER — Ambulatory Visit (INDEPENDENT_AMBULATORY_CARE_PROVIDER_SITE_OTHER): Payer: Medicare Other | Admitting: Gastroenterology

## 2012-08-16 ENCOUNTER — Encounter: Payer: Self-pay | Admitting: Gastroenterology

## 2012-08-16 VITALS — BP 168/80 | HR 68 | Ht 62.0 in | Wt 145.1 lb

## 2012-08-16 DIAGNOSIS — K5900 Constipation, unspecified: Secondary | ICD-10-CM

## 2012-08-16 DIAGNOSIS — K921 Melena: Secondary | ICD-10-CM | POA: Diagnosis not present

## 2012-08-16 DIAGNOSIS — R109 Unspecified abdominal pain: Secondary | ICD-10-CM | POA: Diagnosis not present

## 2012-08-16 MED ORDER — PEG-KCL-NACL-NASULF-NA ASC-C 100 G PO SOLR: 1.0000 | Freq: Once | ORAL | Status: DC

## 2012-08-16 NOTE — Patient Instructions (Addendum)
You have been scheduled for a colonoscopy with propofol. Please follow written instructions given to you at your visit today.  Please pick up your prep kit at the pharmacy within the next 1-3 days. If you use inhalers (even only as needed), please bring them with you on the day of your procedure. Your physician has requested that you go to www.startemmi.com and enter the access code given to you at your visit today. This web site gives a general overview about your procedure. However, you should still follow specific instructions given to you by our office regarding your preparation for the procedure.  Start Miralax mixing 17 grams in 8 oz of water 1-2 x daily for constipation.  Thank you for choosing me and [facility].  Venita Lick. Pleas Koch., MD., Clementeen Graham

## 2012-08-16 NOTE — Progress Notes (Signed)
History of Present Illness: This is a 72 year old female who relates problems with constipation for the past several years. She tried several laxatives with only partial relief. She notes frequent episodes of bright red blood per rectum with bowel movements particularly when she strains. She also notes mild lower abdominal pain with straining particularly around the site of her surgical incision. The symptoms have been present for several months. She previously underwent colonoscopy by Dr. Nechama Guard 2005. Colonoscopy was normal. Denies weight loss, diarrhea, change in stool caliber, melena, nausea, vomiting, dysphagia, reflux symptoms, chest pain.  Review of Systems: Pertinent positive and negative review of systems were noted in the above HPI section. All other review of systems were otherwise negative.  Current Medications, Allergies, Past Medical History, Past Surgical History, Family History and Social History were reviewed in [employer] record.  Physical Exam: General: Well developed , well nourished, no acute distress Head: Normocephalic and atraumatic Eyes:  sclerae anicteric, EOMI Ears: Normal auditory acuity Mouth: No deformity or lesions Neck: Supple, no masses or thyromegaly Lungs: Clear throughout to auscultation Heart: Regular rate and rhythm; no murmurs, rubs or bruits Abdomen: Soft, and non distended. Mild tenderness around her well-healed midline incision. No masses, hepatosplenomegaly or hernias noted. Normal Bowel sounds Rectal: no lesions noted, heme - stool Musculoskeletal: Symmetrical with no gross deformities  Skin: No lesions on visible extremities Pulses:  Normal pulses noted Extremities: No clubbing, cyanosis, edema or deformities noted Neurological: Alert oriented x 4, grossly nonfocal Cervical Nodes:  No significant cervical adenopathy Inguinal Nodes: No significant inguinal adenopathy Psychological:  Alert and cooperative. Normal mood and  affect  Assessment and Recommendations:  1. Hematochezia, lower abdominal pain and constipation. Her lower abdominal pain is likely related to constipation possibly related to adhesions. High fiber diet and increased water intake. Miralax qd to bid. Schedule colonoscopy. The risks, benefits, and alternatives to colonoscopy with possible biopsy and possible polypectomy were discussed with the patient and they consent to proceed.

## 2012-08-25 ENCOUNTER — Encounter: Payer: Self-pay | Admitting: Gastroenterology

## 2012-08-25 ENCOUNTER — Ambulatory Visit (AMBULATORY_SURGERY_CENTER): Payer: Medicare Other | Admitting: Gastroenterology

## 2012-08-25 VITALS — BP 162/83 | HR 54 | Temp 97.3°F | Resp 16 | Ht 62.0 in | Wt 145.0 lb

## 2012-08-25 DIAGNOSIS — K5900 Constipation, unspecified: Secondary | ICD-10-CM

## 2012-08-25 DIAGNOSIS — I10 Essential (primary) hypertension: Secondary | ICD-10-CM | POA: Diagnosis not present

## 2012-08-25 DIAGNOSIS — D126 Benign neoplasm of colon, unspecified: Secondary | ICD-10-CM

## 2012-08-25 DIAGNOSIS — K921 Melena: Secondary | ICD-10-CM

## 2012-08-25 DIAGNOSIS — K625 Hemorrhage of anus and rectum: Secondary | ICD-10-CM | POA: Diagnosis not present

## 2012-08-25 DIAGNOSIS — Z1211 Encounter for screening for malignant neoplasm of colon: Secondary | ICD-10-CM | POA: Diagnosis not present

## 2012-08-25 MED ORDER — SODIUM CHLORIDE 0.9 % IV SOLN: 500.0000 mL | INTRAVENOUS | Status: DC

## 2012-08-25 NOTE — Op Note (Signed)
[facility] [address] [place], [address]   COLONOSCOPY PROCEDURE REPORT  PATIENT: Robin Anderson, Robin Anderson  MR#: [id number] BIRTHDATE: [birth year] , 71  yrs. old GENDER: Female ENDOSCOPIST: Meryl Dare, MD, [facility] PROCEDURE DATE:  08/25/2012 PROCEDURE:   Colonoscopy with biopsy ASA CLASS:   Class II INDICATIONS:Rectal Bleeding and Constipation. MEDICATIONS: MAC sedation, administered by CRNA and propofol (Diprivan) 100mg  IV DESCRIPTION OF PROCEDURE:   After the risks benefits and alternatives of the procedure were thoroughly explained, informed consent was obtained.  A digital rectal exam revealed no abnormalities of the rectum.   The LB CF-[id number]  endoscope was introduced through the anus and advanced to the cecum, which was identified by both the appendix and ileocecal valve. No adverse events experienced.   The quality of the prep was good, using MoviPrep  The instrument was then slowly withdrawn as the colon was fully examined.  COLON FINDINGS: A sessile polyp measuring 3 mm in size was found.  A polypectomy was performed with cold forceps.  The resection was complete and the polyp tissue was completely retrieved.   The colon was otherwise normal.  There was no diverticulosis, inflammation, polyps or cancers unless previously stated.  Retroflexed views revealed no abnormalities. The time to cecum=3 minutes 52 seconds. Withdrawal time=10 minutes 48 seconds.  The scope was withdrawn and the procedure completed.  COMPLICATIONS: There were no complications.  ENDOSCOPIC IMPRESSION: 1.   Sessile polyp measuring 3 mm in the cecum; polypectomy performed with cold forceps 2.   The colon was otherwise normal  RECOMMENDATIONS: 1.  Await pathology results 2.  Repeat colonoscopy in 5 years if polyp adenomatous; otherwise 10 years  eSigned:  Meryl Dare, MD, [facility] 08/25/2012 10:03 AM

## 2012-08-25 NOTE — Patient Instructions (Signed)
YOU HAD AN ENDOSCOPIC PROCEDURE TODAY AT THE [facility]: Refer to the procedure report that was given to you for any specific questions about what was found during the examination.  If the procedure report does not answer your questions, please call your gastroenterologist to clarify.  If you requested that your care partner not be given the details of your procedure findings, then the procedure report has been included in a sealed envelope for you to review at your convenience later.  YOU SHOULD EXPECT: Some feelings of bloating in the abdomen. Passage of more gas than usual.  Walking can help get rid of the air that was put into your GI tract during the procedure and reduce the bloating. If you had a lower endoscopy (such as a colonoscopy or flexible sigmoidoscopy) you may notice spotting of blood in your stool or on the toilet paper. If you underwent a bowel prep for your procedure, then you may not have a normal bowel movement for a few days.  DIET: Your first meal following the procedure should be a light meal and then it is ok to progress to your normal diet.  A half-sandwich or bowl of soup is an example of a good first meal.  Heavy or fried foods are harder to digest and may make you feel nauseous or bloated.  Likewise meals heavy in dairy and vegetables can cause extra gas to form and this can also increase the bloating.  Drink plenty of fluids but you should avoid alcoholic beverages for 24 hours.  ACTIVITY: Your care partner should take you home directly after the procedure.  You should plan to take it easy, moving slowly for the rest of the day.  You can resume normal activity the day after the procedure however you should NOT DRIVE or use heavy machinery for 24 hours (because of the sedation medicines used during the test).    SYMPTOMS TO REPORT IMMEDIATELY: A gastroenterologist can be reached at any hour.  During normal business hours, 8:30 AM to 5:00 PM Monday through Friday,  call [phone].  After hours and on weekends, please call the GI answering service at [phone] who will take a message and have the physician on call contact you.   Following lower endoscopy (colonoscopy or flexible sigmoidoscopy):  Excessive amounts of blood in the stool  Significant tenderness or worsening of abdominal pains  Swelling of the abdomen that is new, acute  Fever of 100F or higher    FOLLOW UP: If any biopsies were taken you will be contacted by phone or by letter within the next 1-3 weeks.  Call your gastroenterologist if you have not heard about the biopsies in 3 weeks.  Our staff will call the home number listed on your records the next business day following your procedure to check on you and address any questions or concerns that you may have at that time regarding the information given to you following your procedure. This is a courtesy call and so if there is no answer at the home number and we have not heard from you through the emergency physician on call, we will assume that you have returned to your regular daily activities without incident.  SIGNATURES/CONFIDENTIALITY: You and/or your care partner have signed paperwork which will be entered into your electronic medical record.  These signatures attest to the fact that that the information above on your After Visit Summary has been reviewed and is understood.  Full responsibility of the confidentiality   of this discharge information lies with you and/or your care-partner.     

## 2012-08-25 NOTE — Progress Notes (Signed)
Lidocaine-40mg IV prior to Propofol InductionPropofol given over incremental dosages 

## 2012-08-25 NOTE — Progress Notes (Signed)
Patient did not experience any of the following events: a burn prior to discharge; a fall within the facility; wrong site/side/patient/procedure/implant event; or a hospital transfer or hospital admission upon discharge from the facility. (G8907) Patient did not have preoperative order for IV antibiotic SSI prophylaxis. (G8918)  

## 2012-08-26 ENCOUNTER — Encounter: Payer: Self-pay | Admitting: *Deleted

## 2012-08-26 NOTE — Telephone Encounter (Signed)
   Follow up Call-  Call back number 08/25/2012  Post procedure Call Back phone  # [phone]  Permission to leave phone message Yes     Patient questions:  Do you have a fever, pain , or abdominal swelling? no Pain Score  0 *  Have you tolerated food without any problems? yes  Have you been able to return to your normal activities? yes  Do you have any questions about your discharge instructions: Diet   no Medications  no Follow up visit  no  Do you have questions or concerns about your Care? no  Actions: * If pain score is 4 or above: No action needed, pain <4. Pt

## 2012-08-31 ENCOUNTER — Encounter: Payer: Self-pay | Admitting: Gastroenterology

## 2012-09-16 ENCOUNTER — Encounter: Payer: Self-pay | Admitting: Internal Medicine

## 2012-09-16 ENCOUNTER — Ambulatory Visit (INDEPENDENT_AMBULATORY_CARE_PROVIDER_SITE_OTHER): Payer: Medicare Other | Admitting: Internal Medicine

## 2012-09-16 VITALS — BP 152/72 | HR 61 | Temp 98.0°F | Ht 62.0 in | Wt 145.4 lb

## 2012-09-16 DIAGNOSIS — I10 Essential (primary) hypertension: Secondary | ICD-10-CM

## 2012-09-16 DIAGNOSIS — R7309 Other abnormal glucose: Secondary | ICD-10-CM

## 2012-09-16 DIAGNOSIS — E785 Hyperlipidemia, unspecified: Secondary | ICD-10-CM

## 2012-09-16 DIAGNOSIS — R7303 Prediabetes: Secondary | ICD-10-CM

## 2012-09-16 MED ORDER — BENAZEPRIL-HYDROCHLOROTHIAZIDE 20-12.5 MG PO TABS: 1.0000 | ORAL_TABLET | Freq: Every day | ORAL | Status: DC

## 2012-09-16 MED ORDER — AMLODIPINE BESYLATE 5 MG PO TABS: 5.0000 mg | ORAL_TABLET | Freq: Every day | ORAL | Status: DC

## 2012-09-16 NOTE — Patient Instructions (Signed)

## 2012-09-16 NOTE — Assessment & Plan Note (Signed)
Uncontrolled and symptomatic Will add Norvasc Cut back sodium in your diet  RTC in1 moth for HTN f/u

## 2012-09-16 NOTE — Assessment & Plan Note (Signed)
Well-controlled, continue current medications

## 2012-09-16 NOTE — Progress Notes (Signed)
HPI  Pt presents to the clinic today to establish care. She is transferring care from Dr. Valarie Cones.  She does have a history of HTN, hyperlipidemia and prediabetes. Her blood pressure has been running consistently high, ranging from 150-168/ 72-89. She has been taking her blood pressure medication as prescribed. She does report consuming a lot of salt in her diet. She also has a history of seizures. She follows Dr. Anne Hahn for this. She has no concerns today.  Flu: no Tetanus: no Eye doctor: yearly Dentist as needed: Colonoscopy: 2014 Mammogram: no longer screening Pap smear: no longer screening  Past Medical History  Diagnosis Date  . Chest pain, unspecified   . HTN (hypertension)   . HLD (hyperlipidemia)   . Seizure disorder   . Prediabetes 07/28/2011  . Heart murmur   . History of colon polyps   . Hepatitis   . Blood in stool     Current Outpatient Prescriptions  Medication Sig Dispense Refill  . aspirin 325 MG tablet Take 325 mg by mouth daily.        . benazepril-hydrochlorthiazide (LOTENSIN HCT) 20-12.5 MG per tablet TAKE ONE TABLET BY MOUTH EVERY DAY AS DIRECTED  30 tablet  4  . carbamazepine (TEGRETOL) 200 MG tablet Take 300 mg by mouth 2 (two) times daily.       . Multiple Vitamins-Minerals (ICAPS MV PO) Take by mouth daily.      . nitroGLYCERIN (NITROSTAT) 0.4 MG SL tablet Place 0.4 mg under the tongue every 5 (five) minutes as needed.        . simvastatin (ZOCOR) 20 MG tablet TAKE ONE TABLET BY MOUTH EVERY DAY AT BEDTIME AS DIRECTED  30 tablet  6   No current facility-administered medications for this visit.    Allergies  Allergen Reactions  . Penicillins     Family History  Problem Relation Age of Onset  . Hypertension Mother   . Heart attack Father   . Hypertension Sister     5 sisters....3 have HTN  . Hypertension Brother   . Breast cancer Sister   . Rectal cancer Sister   . Heart attack Daughter     History   Social History  . Marital Status: Married     Spouse Name: N/A    Number of Children: 6  . Years of Education: 12   Occupational History  . retired    Social History Main Topics  . Smoking status: Former Smoker    Types: Cigarettes  . Smokeless tobacco: Never Used  . Alcohol Use: No  . Drug Use: No  . Sexually Active: Not on file   Other Topics Concern  . Not on file   Social History Narrative   Single. Works full time.      Regular exercise-yes   Caffeine Use-yes    ROS:  Constitutional: Pt reports headache. Denies fever, malaise, fatigue, or abrupt weight changes.  HEENT: Pt reports blurred vision. Denies eye pain, eye redness, ear pain, ringing in the ears, wax buildup, runny nose, nasal congestion, bloody nose, or sore throat. Respiratory: Denies difficulty breathing, shortness of breath, cough or sputum production.   Cardiovascular: Denies chest pain, chest tightness, palpitations or swelling in the hands or feet.  Gastrointestinal: Pt reports constipation. Denies abdominal pain, bloating, diarrhea or blood in the stool.  GU: Denies frequency, urgency, pain with urination, blood in urine, odor or discharge. Musculoskeletal: Denies decrease in range of motion, difficulty with gait, muscle pain or joint pain and  swelling.  Skin: Denies redness, rashes, lesions or ulcercations.  Neurological: Denies dizziness, difficulty with memory, difficulty with speech or problems with balance and coordination.   No other specific complaints in a complete review of systems (except as listed in HPI above).  PE:  BP 152/72  Pulse 61  Temp(Src) 98 F (36.7 C) (Oral)  Ht 5\' 2"  (1.575 m)  Wt 145 lb 6.4 oz (65.953 kg)  BMI 26.59 kg/m2  SpO2 95% Wt Readings from Last 3 Encounters:  09/16/12 145 lb 6.4 oz (65.953 kg)  08/25/12 145 lb (65.772 kg)  08/16/12 145 lb 2 oz (65.828 kg)    General: Appears ther stated age, well developed, well nourished in NAD. HEENT: Head: normal shape and size; Eyes: sclera white, no icterus,  conjunctiva pink, PERRLA and EOMs intact, cataracts both eyes.; Ears: Tm's gray and intact, normal light reflex; Nose: mucosa pink and moist, septum midline; Throat/Mouth: Teeth present, mucosa pink and moist, no lesions or ulcerations noted.  Neck: Normal range of motion. Neck supple, trachea midline. No massses, lumps or thyromegaly present.  Cardiovascular: Normal rate and rhythm. S1,S2 noted.  Murmur noted. No rubs or gallops noted. No JVD or BLE edema. No carotid bruits noted. Pulmonary/Chest: Normal effort and positive vesicular breath sounds. No respiratory distress. No wheezes, rales or ronchi noted.  Abdomen: Soft and nontender. Normal bowel sounds, no bruits noted. No distention or masses noted. Liver, spleen and kidneys non palpable. Musculoskeletal: Normal range of motion. No signs of joint swelling. No difficulty with gait.  Neurological: Alert and oriented. Cranial nerves II-XII intact. Coordination normal. +DTRs bilaterally. Psychiatric: Mood and affect normal. Behavior is normal. Judgment and thought content normal.   EKG:  BMET    Component Value Date/Time   NA 140 08/10/2012 1118   K 3.5 08/10/2012 1118   CL 104 08/10/2012 1118   CO2 30 08/10/2012 1118   GLUCOSE 84 08/10/2012 1118   BUN 16 08/10/2012 1118   CREATININE 1.0 08/10/2012 1118   CALCIUM 8.9 08/10/2012 1118   GFRNONAA 71.35 11/26/2008 1558   GFRAA 72 12/12/2007 1034    Lipid Panel     Component Value Date/Time   CHOL 185 08/10/2012 1118   TRIG 51.0 08/10/2012 1118   HDL 69.10 08/10/2012 1118   CHOLHDL 3 08/10/2012 1118   VLDL 10.2 08/10/2012 1118   LDLCALC 106* 08/10/2012 1118    CBC    Component Value Date/Time   WBC 5.5 08/10/2012 1118   RBC 4.04 08/10/2012 1118   HGB 11.3* 08/10/2012 1118   HCT 34.5* 08/10/2012 1118   PLT 186.0 08/10/2012 1118   MCV 85.4 08/10/2012 1118   MCHC 32.7 08/10/2012 1118   RDW 13.6 08/10/2012 1118   LYMPHSABS 1.0 08/10/2012 1118   MONOABS 0.4 08/10/2012 1118   EOSABS 0.3 08/10/2012 1118    BASOSABS 0.0 08/10/2012 1118    Hgb A1C Lab Results  Component Value Date   HGBA1C 5.8 08/10/2012     Assessment and Plan:  Prevent health Maintenance:  Pt declines tdap today Encouraged pt to work on diet and exercise

## 2012-09-19 ENCOUNTER — Telehealth: Payer: Self-pay | Admitting: *Deleted

## 2012-09-19 NOTE — Telephone Encounter (Signed)
Pt wants to know if she should take new BP medication prescribed at [facility] with current BP medication.

## 2012-09-19 NOTE — Telephone Encounter (Signed)
Pt informed of NP's advisement.  

## 2012-09-19 NOTE — Telephone Encounter (Signed)
Yes, I want her to take both.

## 2012-10-24 ENCOUNTER — Ambulatory Visit (INDEPENDENT_AMBULATORY_CARE_PROVIDER_SITE_OTHER): Payer: Medicare Other | Admitting: Internal Medicine

## 2012-10-24 ENCOUNTER — Encounter: Payer: Self-pay | Admitting: Internal Medicine

## 2012-10-24 VITALS — BP 132/68 | HR 64 | Temp 97.9°F | Resp 16 | Wt 145.0 lb

## 2012-10-24 DIAGNOSIS — I10 Essential (primary) hypertension: Secondary | ICD-10-CM

## 2012-10-24 NOTE — Patient Instructions (Signed)
DASH Diet  The DASH diet stands for "Dietary Approaches to Stop Hypertension." It is a healthy eating plan that has been shown to reduce high blood pressure (hypertension) in as little as 14 days, while also possibly providing other significant health benefits. These other health benefits include reducing the risk of breast cancer after menopause and reducing the risk of type 2 diabetes, heart disease, colon cancer, and stroke. Health benefits also include weight loss and slowing kidney failure in patients with chronic kidney disease.   DIET GUIDELINES  · Limit salt (sodium). Your diet should contain less than 1500 mg of sodium daily.  · Limit refined or processed carbohydrates. Your diet should include mostly whole grains. Desserts and added sugars should be used sparingly.  · Include small amounts of heart-healthy fats. These types of fats include nuts, oils, and tub margarine. Limit saturated and trans fats. These fats have been shown to be harmful in the body.  CHOOSING FOODS   The following food groups are based on a 2000 calorie diet. See your Registered Dietitian for individual calorie needs.  Grains and Grain Products (6 to 8 servings daily)  · Eat More Often: Whole-wheat bread, Mastro rice, whole-grain or wheat pasta, quinoa, popcorn without added fat or salt (air popped).  · Eat Less Often: White bread, white pasta, white rice, cornbread.  Vegetables (4 to 5 servings daily)  · Eat More Often: Fresh, frozen, and canned vegetables. Vegetables may be raw, steamed, roasted, or grilled with a minimal amount of fat.  · Eat Less Often/Avoid: Creamed or fried vegetables. Vegetables in a cheese sauce.  Fruit (4 to 5 servings daily)  · Eat More Often: All fresh, canned (in natural juice), or frozen fruits. Dried fruits without added sugar. One hundred percent fruit juice (½ cup [237 mL] daily).  · Eat Less Often: Dried fruits with added sugar. Canned fruit in light or heavy syrup.  Lean Meats, Fish, and Poultry (2  servings or less daily. One serving is 3 to 4 oz [85-114 g]).  · Eat More Often: Ninety percent or leaner ground beef, tenderloin, sirloin. Round cuts of beef, chicken breast, turkey breast. All fish. Grill, bake, or broil your meat. Nothing should be fried.  · Eat Less Often/Avoid: Fatty cuts of meat, turkey, or chicken leg, thigh, or wing. Fried cuts of meat or fish.  Dairy (2 to 3 servings)  · Eat More Often: Low-fat or fat-free milk, low-fat plain or light yogurt, reduced-fat or part-skim cheese.  · Eat Less Often/Avoid: Milk (whole, 2%). Whole milk yogurt. Full-fat cheeses.  Nuts, Seeds, and Legumes (4 to 5 servings per week)  · Eat More Often: All without added salt.  · Eat Less Often/Avoid: Salted nuts and seeds, canned beans with added salt.  Fats and Sweets (limited)  · Eat More Often: Vegetable oils, tub margarines without trans fats, sugar-free gelatin. Mayonnaise and salad dressings.  · Eat Less Often/Avoid: Coconut oils, palm oils, butter, stick margarine, cream, half and half, cookies, candy, pie.  FOR MORE INFORMATION  The Dash Diet Eating Plan: www.dashdiet.org  Document Released: 03/26/2011 Document Revised: 06/29/2011 Document Reviewed: 03/26/2011  Exit[employer]® Patient Information ©2014 [employer].

## 2012-10-24 NOTE — Progress Notes (Signed)
Subjective:    Patient ID: Robin Anderson, female    DOB: 28-Jul-1940, 72 y.o.   MRN: [id number]  HPI  Pt presents to the clinic today for 1 month follow up for HTN. At her last visit, her blood pressure was not well controlled. Norvasc was added to her regimen in addition to her Lotensin. Her blood pressure has been doing well since adding the Norvasc, less than 140/80. She is tolerating the medication well without any side effects.  Review of Systems  Past Medical History  Diagnosis Date  . Chest pain, unspecified   . HTN (hypertension)   . HLD (hyperlipidemia)   . Seizure disorder   . Prediabetes 07/28/2011  . Heart murmur   . History of colon polyps   . Hepatitis   . Blood in stool     Current Outpatient Prescriptions  Medication Sig Dispense Refill  . amLODipine (NORVASC) 5 MG tablet Take 1 tablet (5 mg total) by mouth daily.  30 tablet  0  . aspirin 325 MG tablet Take 325 mg by mouth daily.        . benazepril-hydrochlorthiazide (LOTENSIN HCT) 20-12.5 MG per tablet Take 1 tablet by mouth daily.  30 tablet  0  . carbamazepine (TEGRETOL) 200 MG tablet Take 300 mg by mouth 2 (two) times daily.       . Multiple Vitamins-Minerals (ICAPS MV PO) Take by mouth daily.      . nitroGLYCERIN (NITROSTAT) 0.4 MG SL tablet Place 0.4 mg under the tongue every 5 (five) minutes as needed.        . simvastatin (ZOCOR) 20 MG tablet TAKE ONE TABLET BY MOUTH EVERY DAY AT BEDTIME AS DIRECTED  30 tablet  6   No current facility-administered medications for this visit.    Allergies  Allergen Reactions  . Penicillins     Family History  Problem Relation Age of Onset  . Hypertension Mother   . Heart attack Father   . Hypertension Sister     5 sisters....3 have HTN  . Hypertension Brother   . Breast cancer Sister   . Rectal cancer Sister   . Heart attack Daughter     History   Social History  . Marital Status: Married    Spouse Name: N/A    Number of Children: 6  . Years of Education:  12   Occupational History  . retired    Social History Main Topics  . Smoking status: Former Smoker    Types: Cigarettes  . Smokeless tobacco: Never Used  . Alcohol Use: No  . Drug Use: No  . Sexually Active: Not on file   Other Topics Concern  . Not on file   Social History Narrative   Single. Works full time.      Regular exercise-yes   Caffeine Use-yes     Constitutional: Denies fever, malaise, fatigue, headache or abrupt weight changes.  Respiratory: Denies difficulty breathing, shortness of breath, cough or sputum production.   Cardiovascular: Denies chest pain, chest tightness, palpitations or swelling in the hands or feet.   Neurological: Denies dizziness, difficulty with memory, difficulty with speech or problems with balance and coordination.   No other specific complaints in a complete review of systems (except as listed in HPI above).     Objective:   Physical Exam  BP 132/68  Pulse 64  Temp(Src) 97.9 F (36.6 C) (Oral)  Resp 16  Wt 145 lb 0.6 oz (65.79 kg)  BMI 26.52 kg/m2  SpO2 97% Wt Readings from Last 3 Encounters:  10/24/12 145 lb 0.6 oz (65.79 kg)  09/16/12 145 lb 6.4 oz (65.953 kg)  08/25/12 145 lb (65.772 kg)    General: Appears her stated age, well developed, well nourished in NAD. Cardiovascular: Normal rate and rhythm. S1,S2 noted.  No murmur, rubs or gallops noted. No JVD or BLE edema. No carotid bruits noted. Pulmonary/Chest: Normal effort and positive vesicular breath sounds. No respiratory distress. No wheezes, rales or ronchi noted.  Neurological: Alert and oriented. Cranial nerves II-XII intact. Coordination normal. +DTRs bilaterally.   BMET    Component Value Date/Time   NA 140 08/10/2012 1118   K 3.5 08/10/2012 1118   CL 104 08/10/2012 1118   CO2 30 08/10/2012 1118   GLUCOSE 84 08/10/2012 1118   BUN 16 08/10/2012 1118   CREATININE 1.0 08/10/2012 1118   CALCIUM 8.9 08/10/2012 1118   GFRNONAA 71.35 11/26/2008 1558   GFRAA 72  12/12/2007 1034    Lipid Panel     Component Value Date/Time   CHOL 185 08/10/2012 1118   TRIG 51.0 08/10/2012 1118   HDL 69.10 08/10/2012 1118   CHOLHDL 3 08/10/2012 1118   VLDL 10.2 08/10/2012 1118   LDLCALC 106* 08/10/2012 1118    CBC    Component Value Date/Time   WBC 5.5 08/10/2012 1118   RBC 4.04 08/10/2012 1118   HGB 11.3* 08/10/2012 1118   HCT 34.5* 08/10/2012 1118   PLT 186.0 08/10/2012 1118   MCV 85.4 08/10/2012 1118   MCHC 32.7 08/10/2012 1118   RDW 13.6 08/10/2012 1118   LYMPHSABS 1.0 08/10/2012 1118   MONOABS 0.4 08/10/2012 1118   EOSABS 0.3 08/10/2012 1118   BASOSABS 0.0 08/10/2012 1118    Hgb A1C Lab Results  Component Value Date   HGBA1C 5.8 08/10/2012         Assessment & Plan:

## 2012-10-24 NOTE — Assessment & Plan Note (Signed)
Much better controlled Continue Lotensin and Norvasc  RTC in 6 months to recheck

## 2012-10-25 DIAGNOSIS — H353190 Nonexudative age-related macular degeneration, unspecified eye, stage unspecified: Secondary | ICD-10-CM | POA: Diagnosis not present

## 2013-01-10 ENCOUNTER — Other Ambulatory Visit: Payer: Self-pay | Admitting: *Deleted

## 2013-01-10 DIAGNOSIS — I10 Essential (primary) hypertension: Secondary | ICD-10-CM

## 2013-01-10 MED ORDER — BENAZEPRIL-HYDROCHLOROTHIAZIDE 20-12.5 MG PO TABS: 1.0000 | ORAL_TABLET | Freq: Every day | ORAL | Status: DC

## 2013-03-14 ENCOUNTER — Other Ambulatory Visit: Payer: Self-pay | Admitting: Physician Assistant

## 2013-04-24 ENCOUNTER — Ambulatory Visit: Payer: Medicare Other | Admitting: Internal Medicine

## 2013-04-27 DIAGNOSIS — H2510 Age-related nuclear cataract, unspecified eye: Secondary | ICD-10-CM | POA: Diagnosis not present

## 2013-04-27 DIAGNOSIS — H43819 Vitreous degeneration, unspecified eye: Secondary | ICD-10-CM | POA: Diagnosis not present

## 2013-05-08 ENCOUNTER — Encounter: Payer: Self-pay | Admitting: Physician Assistant

## 2013-05-08 ENCOUNTER — Other Ambulatory Visit (INDEPENDENT_AMBULATORY_CARE_PROVIDER_SITE_OTHER): Payer: Medicare Other

## 2013-05-08 ENCOUNTER — Ambulatory Visit (INDEPENDENT_AMBULATORY_CARE_PROVIDER_SITE_OTHER): Payer: Medicare Other | Admitting: Physician Assistant

## 2013-05-08 VITALS — BP 162/70 | HR 67 | Temp 98.8°F | Wt 140.8 lb

## 2013-05-08 DIAGNOSIS — I10 Essential (primary) hypertension: Secondary | ICD-10-CM

## 2013-05-08 DIAGNOSIS — R209 Unspecified disturbances of skin sensation: Secondary | ICD-10-CM | POA: Diagnosis not present

## 2013-05-08 DIAGNOSIS — R202 Paresthesia of skin: Secondary | ICD-10-CM

## 2013-05-08 LAB — BASIC METABOLIC PANEL
BUN: 21 mg/dL (ref 6–23)
CO2: 32 mEq/L (ref 19–32)
Calcium: 9 mg/dL (ref 8.4–10.5)
Chloride: 106 mEq/L (ref 96–112)
Creatinine, Ser: 0.9 mg/dL (ref 0.4–1.2)
GFR: 75.62 mL/min (ref >60.00)
Glucose, Bld: 77 mg/dL (ref 70–99)
Potassium: 3.3 mEq/L — ABNORMAL LOW (ref 3.5–5.1)
Sodium: 143 mEq/L (ref 135–145)

## 2013-05-08 NOTE — Progress Notes (Signed)
Pre-visit discussion using our clinic review tool. No additional management support is needed unless otherwise documented below in the visit note.  

## 2013-05-08 NOTE — Progress Notes (Signed)
Subjective:     Patient ID: Robin Anderson, female   DOB: [birth year], 73 y.o.   MRN: [id number]  HPI Comments: Patient is a 73 year old female who presents to the office for follow up visit for hypertension. States she takes Norvasc 5 mg and Lotensin HCT 20-12.5 mg with good control of blood pressure. States she was surprised at her blood pressure when the CMA was rooming her because she does not normally run that high. Reports her blood pressure yesterday at home was 122/70. Thinks elevated now because she was stressed this morning to get to her appointment on time. Patient also reports she has intermittent tingling in her non dominant left hand and fingers. Says she has noticed this off and on for the last 2-3 months. States the tingling does not seem to be associated with a particular movement or position, and fingers/hand are not numb to feeling just a tingling sensation. Denies headache, visual changes/distubances, chest pain/palpitations, U/LE swelling, injury, weakness or rash. States no refills needed.    Review of Systems  Constitutional: Negative for fever.  Eyes: Negative for pain and visual disturbance.  Respiratory: Negative for cough and shortness of breath.   Cardiovascular: Negative for chest pain, palpitations and leg swelling.  Skin: Negative for rash.  Neurological: Negative for dizziness, weakness, light-headedness, numbness and headaches.  All other systems reviewed and are negative.       Objective:   Physical Exam  Vitals reviewed. Constitutional: She is oriented to person, place, and time. She appears well-developed and well-nourished. No distress.  HENT:  Head: Normocephalic and atraumatic.  Eyes: Conjunctivae are normal.  Neck: Normal range of motion.  Cardiovascular: Normal rate, regular rhythm, normal heart sounds and normal pulses.  Exam reveals no gallop and no friction rub.   No murmur heard. Pulses:      Radial pulses are 2+ on the right side, and 2+ on the  left side.  Pulmonary/Chest: Effort normal and breath sounds normal. She has no wheezes. She has no rhonchi. She has no rales.  Musculoskeletal:  FROM ULE including wrist and fingers. Left digits light touch intact, without erythema, edema or rash. Negative tinels. Symmetrical grip strength.   Neurological: She is alert and oriented to person, place, and time. No sensory deficit.  Skin: Skin is warm and dry.  Psychiatric: She has a normal mood and affect.    BP Readings from Last 3 Encounters:  05/08/13 162/70  10/24/12 132/68  09/16/12 152/72   Past Medical History  Diagnosis Date  . Chest pain, unspecified   . HTN (hypertension)   . HLD (hyperlipidemia)   . Seizure disorder   . Prediabetes 07/28/2011  . Heart murmur   . History of colon polyps   . Hepatitis   . Blood in stool     Assessment and Plan    HTN Patient stated CMA took blood pressure in left arm. I repeated bilateral blood pressures. Right: 142/70 Left 137/74 Patient instructed to continue medication of Norvasc 5 mg one tab daily and Lotensin HCT 20-12.5 mg one tab daily.  Tingling of left upper extremity Refer to neurology for nerve conduction testing.

## 2013-05-08 NOTE — Assessment & Plan Note (Signed)
HTN Patient stated CMA took blood pressure in left arm. I repeated bilateral blood pressures. Right: 142/70 Left 137/74 Patient instructed to continue medication of Norvasc 5 mg one tab daily and Lotensin HCT 20-12.5 mg one tab daily. BMET order and will be reviewed to evaluate potassium.

## 2013-05-08 NOTE — Patient Instructions (Signed)
Nice to meet you today Robin Anderson.  I have ordered labs for you and will review them when complete.  I have made referral to neurology for a nerve conduction test of left arm.   Hypertension Hypertension is another name for high blood pressure. High blood pressure may mean that your heart needs to work harder to pump blood. Blood pressure consists of two numbers, which includes a higher number over a lower number (example: 110/72). HOME CARE   Make lifestyle changes as told by your doctor. This may include weight loss and exercise.  Take your blood pressure medicine every day.  Limit how much salt you use.  Stop smoking if you smoke.  Do not use drugs.  Talk to your doctor if you are using decongestants or birth control pills. These medicines might make blood pressure higher.  Females should not drink more than 1 alcoholic drink per day. Males should not drink more than 2 alcoholic drinks per day.  See your doctor as told. GET HELP RIGHT AWAY IF:   You have a blood pressure reading with a top number of 180 or higher.  You get a very bad headache.  You get blurred or changing vision.  You feel confused.  You feel weak, numb, or faint.  You get chest or belly (abdominal) pain.  You throw up (vomit).  You cannot breathe very well. MAKE SURE YOU:   Understand these instructions.  Will watch your condition.  Will get help right away if you are not doing well or get worse. Document Released: 09/23/2007 Document Revised: 06/29/2011 Document Reviewed: 09/23/2007 [facility] Patient Information 2014 [place], [place].

## 2013-05-11 ENCOUNTER — Other Ambulatory Visit: Payer: Self-pay | Admitting: Physician Assistant

## 2013-05-11 DIAGNOSIS — E876 Hypokalemia: Secondary | ICD-10-CM

## 2013-05-11 MED ORDER — POTASSIUM CHLORIDE ER 10 MEQ PO TBCR: 10.0000 meq | EXTENDED_RELEASE_TABLET | Freq: Every day | ORAL | Status: DC

## 2013-05-14 ENCOUNTER — Other Ambulatory Visit: Payer: Self-pay | Admitting: Cardiology

## 2013-05-14 ENCOUNTER — Other Ambulatory Visit: Payer: Self-pay | Admitting: Neurology

## 2013-05-15 ENCOUNTER — Ambulatory Visit (INDEPENDENT_AMBULATORY_CARE_PROVIDER_SITE_OTHER): Payer: Medicare Other | Admitting: Radiology

## 2013-05-15 DIAGNOSIS — G5600 Carpal tunnel syndrome, unspecified upper limb: Secondary | ICD-10-CM | POA: Diagnosis not present

## 2013-05-16 NOTE — Procedures (Signed)
   [facility]  NCS (NERVE CONDUCTION STUDY) WITH EMG (ELECTROMYOGRAPHY) REPORT   STUDY DATE: 05/15/13 PATIENT NAME: Robin Anderson DOB: [birth year] MRN: [id number]  ORDERING CLINICIAN: Stacy Gardner  TECHNOLOGIST: Towana Badger ELECTROMYOGRAPHER: none  CLINICAL INFORMATION: 73 year old female with left arm tingling.  FINDINGS: NERVE CONDUCTION STUDY: Left median, left ulnar motor responses have normal distal latencies, amplitudes, conduction velocities and F-wave latencies. Left median, left ulnar and left radial sensory responses are normal. Left ulnar transcarpal mixed nerve response is normal amplitude and conduction velocity. Left median transcarpal mixed nerve response has normal amplitude and borderline slow conduction velocity (45 m/s, normal greater than or equal to 48 m/s).  NEEDLE ELECTROMYOGRAPHY: Needle EMG was not ordered.  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   INTERPRETING PHYSICIAN:  Penni Bombard, MD Certified in Neurology, Neurophysiology and Neuroimaging  [facility] [address] [place], [place] [address] [phone]

## 2013-06-02 ENCOUNTER — Telehealth: Payer: Self-pay | Admitting: Cardiology

## 2013-06-02 NOTE — Telephone Encounter (Signed)
Spoke with pt who is concerned that her potassium supplement is causing her BP to be elevated.  Explained to her that K+ would not do that.  She reports BP last night was 235/107.  This am it was 168/87.  She reports she is taking her medications as listed.  Advised to keep a blood pressure dairy thru the weekend and call back next week if her BP remains elevated.  She states understanding.

## 2013-06-02 NOTE — Telephone Encounter (Signed)
New Message  Pt called states she is taking her potassium medication and believes that it is causing her BP to rise. She states she has a tingling in her fingers.  She is requesting a call back to discuss

## 2013-06-07 ENCOUNTER — Telehealth: Payer: Self-pay | Admitting: Cardiology

## 2013-06-07 NOTE — Telephone Encounter (Signed)
New message    Patient calling in blood pressure  143/76 reading.  Yesterday  135/85 . Monday am  108/80 . Sunday evening x 2  125/85, 159/81 . Saturday 137/85  No chest pain no sob.

## 2013-06-07 NOTE — Telephone Encounter (Signed)
Spoke with pt, aware these bp numbers are good. appt tomorrow with pa about bp cx per pt request.

## 2013-06-08 ENCOUNTER — Ambulatory Visit: Payer: Medicare Other | Admitting: Physician Assistant

## 2013-06-09 ENCOUNTER — Telehealth: Payer: Self-pay | Admitting: *Deleted

## 2013-06-09 NOTE — Telephone Encounter (Signed)
Patient phoned requesting results from nerve conduction study-recommended she contact [facility].  States she's tried & they told her to call here.  Please advise.  Patient would also like for you to review her previously noted BP readings reported to cardiology 2/13 and 2/18   CB# [phone]

## 2013-06-09 NOTE — Telephone Encounter (Signed)
Phoned and relayed PCP's response and recommendation.  Patient verbalized understanding.

## 2013-06-12 DIAGNOSIS — H2510 Age-related nuclear cataract, unspecified eye: Secondary | ICD-10-CM | POA: Diagnosis not present

## 2013-06-12 DIAGNOSIS — H43819 Vitreous degeneration, unspecified eye: Secondary | ICD-10-CM | POA: Diagnosis not present

## 2013-06-13 ENCOUNTER — Other Ambulatory Visit: Payer: Self-pay | Admitting: Neurology

## 2013-06-26 DIAGNOSIS — H6120 Impacted cerumen, unspecified ear: Secondary | ICD-10-CM | POA: Diagnosis not present

## 2013-06-29 DIAGNOSIS — H6120 Impacted cerumen, unspecified ear: Secondary | ICD-10-CM | POA: Diagnosis not present

## 2013-07-11 ENCOUNTER — Other Ambulatory Visit: Payer: Self-pay | Admitting: Cardiology

## 2013-07-11 ENCOUNTER — Other Ambulatory Visit: Payer: Self-pay | Admitting: Neurology

## 2013-07-11 DIAGNOSIS — H6120 Impacted cerumen, unspecified ear: Secondary | ICD-10-CM | POA: Diagnosis not present

## 2013-07-13 ENCOUNTER — Telehealth: Payer: Self-pay | Admitting: Neurology

## 2013-07-13 DIAGNOSIS — T169XXA Foreign body in ear, unspecified ear, initial encounter: Secondary | ICD-10-CM | POA: Diagnosis not present

## 2013-07-13 NOTE — Telephone Encounter (Signed)
Patient called to state that she would like to schedule an appointment with Dr. Jannifer Franklin soon due to the shaking continuing in her right hand. Please call and advise.

## 2013-07-14 NOTE — Telephone Encounter (Signed)
Called pt to make appt on 08/03/13 with Dr. Jannifer Franklin. I advised the pt that if she has any other problems, questions or concerns to call the office. Pt verbalized understanding.

## 2013-08-03 ENCOUNTER — Ambulatory Visit (INDEPENDENT_AMBULATORY_CARE_PROVIDER_SITE_OTHER): Payer: Medicare Other | Admitting: Neurology

## 2013-08-03 ENCOUNTER — Encounter: Payer: Self-pay | Admitting: Neurology

## 2013-08-03 ENCOUNTER — Other Ambulatory Visit (INDEPENDENT_AMBULATORY_CARE_PROVIDER_SITE_OTHER): Payer: Self-pay

## 2013-08-03 VITALS — BP 156/75 | HR 72 | Wt 143.0 lb

## 2013-08-03 DIAGNOSIS — G40409 Other generalized epilepsy and epileptic syndromes, not intractable, without status epilepticus: Secondary | ICD-10-CM

## 2013-08-03 DIAGNOSIS — G252 Other specified forms of tremor: Secondary | ICD-10-CM | POA: Diagnosis not present

## 2013-08-03 DIAGNOSIS — G250 Essential tremor: Secondary | ICD-10-CM

## 2013-08-03 DIAGNOSIS — Z0289 Encounter for other administrative examinations: Secondary | ICD-10-CM

## 2013-08-03 DIAGNOSIS — G40309 Generalized idiopathic epilepsy and epileptic syndromes, not intractable, without status epilepticus: Secondary | ICD-10-CM

## 2013-08-03 MED ORDER — CARBAMAZEPINE 200 MG PO TABS
300.0000 mg | ORAL_TABLET | Freq: Two times a day (BID) | ORAL | Status: DC
Start: 2013-08-03 — End: 2013-09-11

## 2013-08-03 NOTE — Progress Notes (Signed)
PATIENT: Robin Anderson DOB: 1940-08-10  REASON FOR VISIT: follow up HISTORY FROM: patient  HISTORY OF PRESENT ILLNESS: Mrs. Canby is an 73 year old right handed female with a history of seizures returning today for follow-up. The patient reports that she has not had any seizures since April 2013. She is not driving. The patient is complaining of the right hand "shaking at times."  She only notices it when she is writing, eating or trying to drink something. At times it "shakes so bad" that she has to hold her right hand with her left. She reports that her grip in the right hand is decreased. She denies any pain, numbness or tingling in the right arm/hand. 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 Also states that at night when she lays down to sleep she will have a pain that starts in the back of the neck on the left side and travels up the back of her head to her forehead. When it does happen it resolves on its own or she will get up and watch TV for a while and it goes away. This pain has happened during the day but she notices it mainly at night, although it does not occur every night. She denies scalp being sore when she rubs it. The only new medical issues since last visit was cerumen impaction in both ears, which was cleaned out by PCP.   REVIEW OF SYSTEMS: Full 14 system review of systems performed and notable only for:  Constitutional: excessive sweating Cardiovascular: chest pain  Ear/Nose/Throat: N/A  Skin: N/A  Eyes: eye redness, eye pain Respiratory: cough  Gastrointestinal: N/A  Genitourinary: N/A Hematology/Lymphatic: N/A  Endocrine: N/A Musculoskeletal:N/A  Allergy/Immunology: N/A  Neurological: memory loss, headache, speech difficulty Psychiatric: nervous/anxious Sleep: daytime sleepiness   ALLERGIES: Allergies  Allergen Reactions  . Penicillins     HOME MEDICATIONS: Outpatient Prescriptions Prior to Visit  Medication Sig  Dispense Refill  . amLODipine (NORVASC) 5 MG tablet Take 1 tablet (5 mg total) by mouth daily.  30 tablet  0  . aspirin 325 MG tablet Take 325 mg by mouth daily.        . benazepril-hydrochlorthiazide (LOTENSIN HCT) 20-12.5 MG per tablet TAKE ONE TABLET BY MOUTH ONCE DAILY  30 tablet  3  . Multiple Vitamins-Minerals (ICAPS MV PO) Take by mouth daily.      . nitroGLYCERIN (NITROSTAT) 0.4 MG SL tablet Place 0.4 mg under the tongue every 5 (five) minutes as needed.        . potassium chloride (KLOR-CON 10) 10 MEQ tablet Take 1 tablet (10 mEq total) by mouth daily.  30 tablet  5  . simvastatin (ZOCOR) 20 MG tablet TAKE ONE TABLET BY MOUTH ONCE DAILY AT BEDTIME AS DIRECTED  30 tablet  0  . EPITOL 200 MG tablet TAKE ONE & ONE-HALF TABLETS BY MOUTH TWICE DAILY  90 tablet  0   No facility-administered medications prior to visit.     PHYSICAL EXAM  Filed Vitals:   08/03/13 1117  BP: 156/75  Pulse: 72  Weight: 143 lb (64.864 kg)   Body mass index is 26.15 kg/(m^2).  Generalized: Well developed, in no acute distress    Neurological examination  Mentation: Alert oriented to time, place, history taking. Follows all commands speech and language fluent Cranial nerve II-XII: extraocular movements were full, visual field were full on confrontational test. Facial sensation and strength were normal. hearing was intact.  head turning and shoulder shrug and were normal and symmetric. Motor: The motor testing reveals 5 over 5 strength of all 4 extremities. Good symmetric motor tone is noted throughout.  Sensory: Sensory testing is intact to soft touch, vibration sensation, and position sense on all 4 extremities. No evidence of extinction is noted.  Coordination: Cerebellar testing reveals good finger-nose-finger and heel-to-shin bilaterally. Mild intention tremor in bilateral upper extremities  Gait and station: Gait is normal. Tandem gait is normal. Romberg is negative. No drift is seen.  Reflexes: Deep  tendon reflexes are symmetric and normal bilaterally. Marland Kitchen   DIAGNOSTIC DATA (LABS, IMAGING, TESTING) - I reviewed patient records, labs, notes, testing and imaging myself where available.   ASSESSMENT AND PLAN 73 y.o. year old female  has a past medical history of Chest pain, unspecified; HTN (hypertension); HLD (hyperlipidemia); Seizure disorder; Prediabetes (07/28/2011); Heart murmur; History of colon polyps; Hepatitis; and Blood in stool. here with:  1. Tonic clonic seizures 2. Essential tremor  Seizures have been well controlled on carbamazepine. Will refill this visit. The patient will have blood work today, will check a carbamazepine level, CBC, and CMP. Will also check a TSH level due to tremor in right hand. If the tremor becomes worse and disrupts daily activities we will consider medication therapy. The pain in the back of the neck that radiates to the forehead is likely due to occipital nerve irritation. In the future may need to start gabapentin if pain interferes with sleep. Follow-up in one year or sooner if needed.    Ward Givens, MSN, NP-C 08/03/2013, 9:04 PM [facility] [address] [place], [place] [address] [phone]  Note: This document was prepared with digital dictation and possible smart phrase technology. Any transcriptional errors that result from this process are unintentional. Kathrynn Ducking

## 2013-08-03 NOTE — Patient Instructions (Signed)
Epilepsy Epilepsy is a disorder in which a person has repeated seizures over time. A seizure is a release of abnormal electrical activity in the brain. Seizures can cause a change in attention, behavior, or the ability to remain awake and alert (altered mental status). Seizures often involve uncontrollable shaking (convulsions).  Most people with epilepsy lead normal lives. However, people with epilepsy are at an increased risk of falls, accidents, and injuries. Therefore, it is important to begin treatment right away. CAUSES  Epilepsy has many possible causes. Anything that disturbs the normal pattern of brain cell activity can lead to seizures. This may include:   Head injury.  Birth trauma.  High fever as a child.  Stroke.  Bleeding into or around the brain.  Certain drugs.  Prolonged low oxygen, such as what occurs after CPR efforts.  Abnormal brain development.  Certain illnesses, such as meningitis, encephalitis (brain infection), malaria, and other infections.  An imbalance of nerve signaling chemicals (neurotransmitters).  SIGNS AND SYMPTOMS  The symptoms of a seizure can vary greatly from one person to another. Right before a seizure, you may have a warning (aura) that a seizure is about to occur. An aura may include the following symptoms:  Fear or anxiety.  Nausea.  Feeling like the room is spinning (vertigo).  Vision changes, such as seeing flashing lights or spots. Common symptoms during a seizure include:  Abnormal sensations, such as an abnormal smell or a bitter taste in the mouth.   Sudden, general body stiffness.   Convulsions that involve rhythmic jerking of the face, arm, or leg on one or both sides.   Sudden change in consciousness.   Appearing to be awake but not responding.   Appearing to be asleep but cannot be awakened.   Grimacing, chewing, lip smacking, drooling, tongue biting, or loss of bowel or bladder control. After a seizure,  you may feel sleepy for a while. DIAGNOSIS  Your health care provider will ask about your symptoms and take a medical history. Descriptions from any witnesses to your seizures will be very helpful in the diagnosis. A physical exam, including a detailed neurological exam, is necessary. Various tests may be done, such as:   An electroencephalogram (EEG). This is a painless test of your brain waves. In this test, a diagram is created of your brain waves. These diagrams can be interpreted by a specialist.  An MRI of the brain.   A CT scan of the brain.   A spinal tap (lumbar puncture, LP).  Blood tests to check for signs of infection or abnormal blood chemistry. TREATMENT  There is no cure for epilepsy, but it is generally treatable. Once epilepsy is diagnosed, it is important to begin treatment as soon as possible. For most people with epilepsy, seizures can be controlled with medicines. The following may also be used:  A pacemaker for the brain (vagus nerve stimulator) can be used for people with seizures that are not well controlled by medicine.  Surgery on the brain. For some people, epilepsy eventually goes away. HOME CARE INSTRUCTIONS   Follow your health care provider's recommendations on driving and safety in normal activities.  Get enough rest. Lack of sleep can cause seizures.  Only take over-the-counter or prescription medicines as directed by your health care provider. Take any prescribed medicine exactly as directed.  Avoid any known triggers of your seizures.  Keep a seizure diary. Record what you recall about any seizure, especially any possible trigger.   Make   sure the people you live and work with know that you are prone to seizures. They should receive instructions on how to help you. In general, a witness to a seizure should:   Cushion your head and body.   Turn you on your side.   Avoid unnecessarily restraining you.   Not place anything inside your  mouth.   Call for emergency medical help if there is any question about what has occurred.   Follow up with your health care provider as directed. You may need regular blood tests to monitor the levels of your medicine.  SEEK MEDICAL CARE IF:   You develop signs of infection or other illness. This might increase the risk of a seizure.   You seem to be having more frequent seizures.   Your seizure pattern is changing.  SEEK IMMEDIATE MEDICAL CARE IF:   You have a seizure that does not stop after a few moments.   You have a seizure that causes any difficulty in breathing.   You have a seizure that results in a very severe headache.   You have a seizure that leaves you with the inability to speak or use a part of your body.  Document Released: 04/06/2005 Document Revised: 01/25/2013 Document Reviewed: 11/16/2012 [employer] Patient Information 2014 [employer].  

## 2013-08-04 ENCOUNTER — Telehealth: Payer: Self-pay | Admitting: *Deleted

## 2013-08-04 LAB — COMPREHENSIVE METABOLIC PANEL
ALT: 12 IU/L (ref 0–32)
AST: 16 IU/L (ref 0–40)
Albumin/Globulin Ratio: 1.8 (ref 1.1–2.5)
Albumin: 4.4 g/dL (ref 3.5–4.8)
Alkaline Phosphatase: 119 IU/L — ABNORMAL HIGH (ref 39–117)
BUN/Creatinine Ratio: 22 (ref 11–26)
BUN: 17 mg/dL (ref 8–27)
CO2: 27 mmol/L (ref 18–29)
Calcium: 9.2 mg/dL (ref 8.7–10.3)
Chloride: 99 mmol/L (ref 97–108)
Creatinine, Ser: 0.77 mg/dL (ref 0.57–1.00)
GFR calc Af Amer: 90 mL/min/{1.73_m2} (ref >59)
GFR calc non Af Amer: 78 mL/min/{1.73_m2} (ref >59)
Globulin, Total: 2.5 g/dL (ref 1.5–4.5)
Glucose: 69 mg/dL (ref 65–99)
Potassium: 4.1 mmol/L (ref 3.5–5.2)
Sodium: 143 mmol/L (ref 134–144)
Total Bilirubin: <0.2 mg/dL (ref 0.0–1.2)
Total Protein: 6.9 g/dL (ref 6.0–8.5)

## 2013-08-04 LAB — CBC WITH DIFFERENTIAL
Basophils Absolute: 0 10*3/uL (ref 0.0–0.2)
Basos: 1 %
Eos: 6 %
Eosinophils Absolute: 0.4 10*3/uL (ref 0.0–0.4)
HCT: 36.8 % (ref 34.0–46.6)
Hemoglobin: 11.7 g/dL (ref 11.1–15.9)
Immature Grans (Abs): 0 10*3/uL (ref 0.0–0.1)
Immature Granulocytes: 0 %
Lymphocytes Absolute: 1.2 10*3/uL (ref 0.7–3.1)
Lymphs: 20 %
MCH: 27.8 pg (ref 26.6–33.0)
MCHC: 31.8 g/dL (ref 31.5–35.7)
MCV: 87 fL (ref 79–97)
Monocytes Absolute: 0.4 10*3/uL (ref 0.1–0.9)
Monocytes: 7 %
Neutrophils Absolute: 4.1 10*3/uL (ref 1.4–7.0)
Neutrophils Relative %: 66 %
Platelets: 221 10*3/uL (ref 150–379)
RBC: 4.21 x10E6/uL (ref 3.77–5.28)
RDW: 14.1 % (ref 12.3–15.4)
WBC: 6.1 10*3/uL (ref 3.4–10.8)

## 2013-08-04 LAB — TSH: TSH: 1.62 u[IU]/mL (ref 0.450–4.500)

## 2013-08-04 LAB — CARBAMAZEPINE LEVEL, TOTAL: Carbamazepine Lvl: 10.4 ug/mL (ref 4.0–12.0)

## 2013-08-04 NOTE — Telephone Encounter (Signed)
Patient returning call regarding blood work results..Please call patient at earliest convenience.  Thanks

## 2013-08-04 NOTE — Telephone Encounter (Signed)
Pt calling back about lab results. Please advise

## 2013-08-04 NOTE — Telephone Encounter (Signed)
I called patient. Blood work was unremarkable. The patient had a therapeutic car making level. Minimal elevation in the alkaline phosphatase level secondary to use of carbamazepine no change in dosing.

## 2013-08-10 ENCOUNTER — Encounter: Payer: Self-pay | Admitting: Cardiovascular Disease

## 2013-08-10 ENCOUNTER — Other Ambulatory Visit: Payer: Self-pay | Admitting: Cardiology

## 2013-08-22 ENCOUNTER — Encounter: Payer: Self-pay | Admitting: Nurse Practitioner

## 2013-08-22 ENCOUNTER — Ambulatory Visit (INDEPENDENT_AMBULATORY_CARE_PROVIDER_SITE_OTHER): Payer: Medicare Other | Admitting: Nurse Practitioner

## 2013-08-22 VITALS — BP 120/62 | HR 55 | Ht 64.0 in | Wt 136.8 lb

## 2013-08-22 DIAGNOSIS — I10 Essential (primary) hypertension: Secondary | ICD-10-CM

## 2013-08-22 DIAGNOSIS — E785 Hyperlipidemia, unspecified: Secondary | ICD-10-CM | POA: Diagnosis not present

## 2013-08-22 LAB — LIPID PANEL
Cholesterol: 180 mg/dL (ref 0–200)
HDL: 52.1 mg/dL (ref >39.00)
LDL Cholesterol: 111 mg/dL — ABNORMAL HIGH (ref 0–99)
Total CHOL/HDL Ratio: 3
Triglycerides: 85 mg/dL (ref 0.0–149.0)
VLDL: 17 mg/dL (ref 0.0–40.0)

## 2013-08-22 NOTE — Patient Instructions (Addendum)
Stay on your current medicines  We will check lipids today  Keep up the good work!  See me in one year  Call the [place] office at [phone] if you have any questions, problems or concerns.

## 2013-08-22 NOTE — Progress Notes (Signed)
Robin Anderson Date of Birth: 11-26-1940 Medical Record #[id number]  History of Present Illness: Robin Anderson is seen back today for a one year check. Former patient of Robin Anderson. Was to establish with Robin Anderson. She has had atypical chest pain, HTN, HLD and glucose intolerance. Other issues as noted below. Normal Myoview from 2004.   Last seen by Robin Dopp, PA a little over a year ago. Seemed to be doing ok but having atypical chest pain - seemed to be more GI.   Comes back today. Here alone. Scheduled with me due to availability. Will be needing medicine refilled in the future but not right now. No chest pain. Not short of breath. BP good. Tolerates her medicines. Has some pain in her left side/abdomen - notes when she goes to use the bathroom. No exertional symptoms. She remains pretty active.   Current Outpatient Prescriptions  Medication Sig Dispense Refill  . amLODipine (NORVASC) 5 MG tablet Take 1 tablet (5 mg total) by mouth daily.  30 tablet  0  . aspirin 325 MG tablet Take 325 mg by mouth daily.        . benazepril-hydrochlorthiazide (LOTENSIN HCT) 20-12.5 MG per tablet TAKE ONE TABLET BY MOUTH ONCE DAILY  30 tablet  3  . carbamazepine (EPITOL) 200 MG tablet Take 1.5 tablets (300 mg total) by mouth 2 (two) times daily.  90 tablet  0  . CIPRODEX otic suspension Place 1 drop into both ears daily.      . Multiple Vitamins-Minerals (ICAPS MV PO) Take by mouth daily.      Marland Kitchen neomycin-polymyxin-hydrocortisone (CORTISPORIN) 3.5-10000-1 otic suspension Place 1 drop into both ears as needed.      . nitroGLYCERIN (NITROSTAT) 0.4 MG SL tablet Place 0.4 mg under the tongue every 5 (five) minutes as needed.        Robin Anderson Glycol-Propyl Glycol 0.4-0.3 % SOLN Apply 1 drop to eye daily.      . potassium chloride (KLOR-CON 10) 10 MEQ tablet Take 1 tablet (10 mEq total) by mouth daily.  30 tablet  5  . simvastatin (ZOCOR) 20 MG tablet TAKE ONE TABLET BY MOUTH ONCE DAILY AT BEDTIME AS DIRECTED   30 tablet  0   No current facility-administered medications for this visit.    Allergies  Allergen Reactions  . Penicillins     Past Medical History  Diagnosis Date  . Chest pain, unspecified   . HTN (hypertension)   . HLD (hyperlipidemia)   . Seizure disorder   . Prediabetes 07/28/2011  . Heart murmur   . History of colon polyps   . Hepatitis   . Blood in stool     Past Surgical History  Procedure Laterality Date  . Tubal ligation    . Abdominal hernia repair      lower mid line hernia which has reherniatied  . Bunionectomy Right     traumatic injury to her foot that was repaired  . Tonsillectomy      History  Smoking status  . Former Smoker  . Types: Cigarettes  Smokeless tobacco  . Never Used    History  Alcohol Use No    Family History  Problem Relation Age of Onset  . Hypertension Mother   . Heart attack Father   . Hypertension Sister     5 sisters....3 have HTN  . Hypertension Brother   . Breast cancer Sister   . Rectal cancer Sister   . Heart attack Daughter  Review of Systems: The review of systems is per the HPI.  All other systems were reviewed and are negative.  Physical Exam: BP 120/62  Pulse 55  Ht 5\' 4"  (1.626 m)  Wt 136 lb 12.8 oz (62.052 kg)  BMI 23.47 kg/m2  SpO2 100% Patient is very pleasant and in no acute distress. Skin is warm and dry. Color is normal.  HEENT is unremarkable. Normocephalic/atraumatic. PERRL. Sclera are nonicteric. Neck is supple. No masses. No JVD. Lungs are clear. Cardiac exam shows a regular rate and rhythm. Abdomen is soft. Extremities are without edema. Gait and ROM are intact. No gross neurologic deficits noted.  LABORATORY DATA: Lipids pending  Lab Results  Component Value Date   WBC 6.1 08/03/2013   HGB 11.7 08/03/2013   HCT 36.8 08/03/2013   PLT 221 08/03/2013   GLUCOSE 69 08/03/2013   CHOL 185 08/10/2012   TRIG 51.0 08/10/2012   HDL 69.10 08/10/2012   LDLCALC 106* 08/10/2012   ALT 12 08/03/2013    AST 16 08/03/2013   NA 143 08/03/2013   K 4.1 08/03/2013   CL 99 08/03/2013   CREATININE 0.77 08/03/2013   BUN 17 08/03/2013   CO2 27 08/03/2013   TSH 1.620 08/03/2013   HGBA1C 5.8 08/10/2012     Assessment / Plan: 1. HTN - BP is great on her current regimen. See again in one year.  2. HLD - on statin - has had complete labs except for her lipids - will get today.   3. Past tobacco abuse - not smoking.  I will see in a year. She is felt to be doing well from our standpoint. Continue with CV risk factor modification.   Patient is agreeable to this plan and will call if any problems develop in the interim.   Robin Junes, RN, [place] [address] [place], [place]  [address] [phone]

## 2013-09-11 ENCOUNTER — Other Ambulatory Visit: Payer: Self-pay | Admitting: Cardiology

## 2013-09-11 ENCOUNTER — Other Ambulatory Visit: Payer: Self-pay | Admitting: Adult Health

## 2013-11-15 ENCOUNTER — Encounter: Payer: Self-pay | Admitting: Neurology

## 2013-11-15 ENCOUNTER — Telehealth: Payer: Self-pay | Admitting: Neurology

## 2013-11-15 ENCOUNTER — Ambulatory Visit (INDEPENDENT_AMBULATORY_CARE_PROVIDER_SITE_OTHER): Payer: Medicare Other | Admitting: Neurology

## 2013-11-15 VITALS — BP 168/85 | HR 67 | Wt 140.0 lb

## 2013-11-15 DIAGNOSIS — G40309 Generalized idiopathic epilepsy and epileptic syndromes, not intractable, without status epilepticus: Secondary | ICD-10-CM

## 2013-11-15 DIAGNOSIS — G40409 Other generalized epilepsy and epileptic syndromes, not intractable, without status epilepticus: Secondary | ICD-10-CM

## 2013-11-15 MED ORDER — LEVETIRACETAM 250 MG PO TABS: 250.0000 mg | ORAL_TABLET | Freq: Two times a day (BID) | ORAL | Status: DC

## 2013-11-15 NOTE — Patient Instructions (Signed)
We will add a low dose of Keppra (levacitram) to the carbamazepine. Look out for drowsiness or irritability on this medication. Call our office if you are having problems.  Epilepsy Epilepsy is a disorder in which a person has repeated seizures over time. A seizure is a release of abnormal electrical activity in the brain. Seizures can cause a change in attention, behavior, or the ability to remain awake and alert (altered mental status). Seizures often involve uncontrollable shaking (convulsions).  Most people with epilepsy lead normal lives. However, people with epilepsy are at an increased risk of falls, accidents, and injuries. Therefore, it is important to begin treatment right away. CAUSES  Epilepsy has many possible causes. Anything that disturbs the normal pattern of brain cell activity can lead to seizures. This may include:   Head injury.  Birth trauma.  High fever as a child.  Stroke.  Bleeding into or around the brain.  Certain drugs.  Prolonged low oxygen, such as what occurs after CPR efforts.  Abnormal brain development.  Certain illnesses, such as meningitis, encephalitis (brain infection), malaria, and other infections.  An imbalance of nerve signaling chemicals (neurotransmitters).  SIGNS AND SYMPTOMS  The symptoms of a seizure can vary greatly from one person to another. Right before a seizure, you may have a warning (aura) that a seizure is about to occur. An aura may include the following symptoms:  Fear or anxiety.  Nausea.  Feeling like the room is spinning (vertigo).  Vision changes, such as seeing flashing lights or spots. Common symptoms during a seizure include:  Abnormal sensations, such as an abnormal smell or a bitter taste in the mouth.   Sudden, general body stiffness.   Convulsions that involve rhythmic jerking of the face, arm, or leg on one or both sides.   Sudden change in consciousness.   Appearing to be awake but not  responding.   Appearing to be asleep but cannot be awakened.   Grimacing, chewing, lip smacking, drooling, tongue biting, or loss of bowel or bladder control. After a seizure, you may feel sleepy for a while. DIAGNOSIS  Your health care provider will ask about your symptoms and take a medical history. Descriptions from any witnesses to your seizures will be very helpful in the diagnosis. A physical exam, including a detailed neurological exam, is necessary. Various tests may be done, such as:   An electroencephalogram (EEG). This is a painless test of your brain waves. In this test, a diagram is created of your brain waves. These diagrams can be interpreted by a specialist.  An MRI of the brain.   A CT scan of the brain.   A spinal tap (lumbar puncture, LP).  Blood tests to check for signs of infection or abnormal blood chemistry. TREATMENT  There is no cure for epilepsy, but it is generally treatable. Once epilepsy is diagnosed, it is important to begin treatment as soon as possible. For most people with epilepsy, seizures can be controlled with medicines. The following may also be used:  A pacemaker for the brain (vagus nerve stimulator) can be used for people with seizures that are not well controlled by medicine.  Surgery on the brain. For some people, epilepsy eventually goes away. HOME CARE INSTRUCTIONS   Follow your health care provider's recommendations on driving and safety in normal activities.  Get enough rest. Lack of sleep can cause seizures.  Only take over-the-counter or prescription medicines as directed by your health care provider. Take any prescribed medicine  exactly as directed.  Avoid any known triggers of your seizures.  Keep a seizure diary. Record what you recall about any seizure, especially any possible trigger.   Make sure the people you live and work with know that you are prone to seizures. They should receive instructions on how to help you. In  general, a witness to a seizure should:   Cushion your head and body.   Turn you on your side.   Avoid unnecessarily restraining you.   Not place anything inside your mouth.   Call for emergency medical help if there is any question about what has occurred.   Follow up with your health care provider as directed. You may need regular blood tests to monitor the levels of your medicine.  SEEK MEDICAL CARE IF:   You develop signs of infection or other illness. This might increase the risk of a seizure.   You seem to be having more frequent seizures.   Your seizure pattern is changing.  SEEK IMMEDIATE MEDICAL CARE IF:   You have a seizure that does not stop after a few moments.   You have a seizure that causes any difficulty in breathing.   You have a seizure that results in a very severe headache.   You have a seizure that leaves you with the inability to speak or use a part of your body.  Document Released: 04/06/2005 Document Revised: 01/25/2013 Document Reviewed: 11/16/2012 [facility] Patient Information 2015 [place], [place]. This information is not intended to replace advice given to you by your health care provider. Make sure you discuss any questions you have with your health care provider.

## 2013-11-15 NOTE — Telephone Encounter (Signed)
I called patient. She indicated that she awakened yesterday morning with redness of the right eye, and she may have bitten the right side of her cheek. She did not bite her tongue. Her last carbamazepine level as a trough was around 10. We cannot go higher on the carbamazepine dose. The patient could have suffered a seizure. She will come in today so I can look at the cheek area to determine whether she has bitten it that would suggest a true seizure. If so, we may need to add some low dose Keppra to her medications.

## 2013-11-15 NOTE — Telephone Encounter (Signed)
Patient was seen last 08/04/13 by Jinny Blossom. She has a seizure on 11/13/13. She wants to know is she needs an earlier appointment. Last office visit state 1 year follow up.

## 2013-11-15 NOTE — Progress Notes (Signed)
Reason for visit: Seizures  Gordon Carlson is an 73 y.o. female  History of present illness:  Ms. Harts is a 73 year old right-handed black female with a history of seizures. She has done quite well on carbamazepine without any seizures for about 2 years, with recent trough blood levels of around 10. The patient awakened 2 days ago with a headache, and redness around the right eye. The patient noted that she had apparently bitten the inside of her cheek on the right side. The patient is concerned that she may have had a seizure at night while sleeping. She indicates that she sleeps with her husband, but he would be unlikely to wake up for anything at nighttime. The patient denies any soreness of the muscles, or new numbness or weakness. She comes into the office today for an evaluation. She had not missed any doses of her carbamazepine, and she has not been sick recently.  Past Medical History  Diagnosis Date  . Chest pain, unspecified   . HTN (hypertension)   . HLD (hyperlipidemia)   . Seizure disorder   . Prediabetes 07/28/2011  . Heart murmur   . History of colon polyps   . Hepatitis   . Blood in stool     Past Surgical History  Procedure Laterality Date  . Tubal ligation    . Abdominal hernia repair      lower mid line hernia which has reherniatied  . Bunionectomy Right     traumatic injury to her foot that was repaired  . Tonsillectomy      Family History  Problem Relation Age of Onset  . Hypertension Mother   . Heart attack Father   . Hypertension Sister     5 sisters....3 have HTN  . Hypertension Brother   . Breast cancer Sister   . Rectal cancer Sister   . Heart attack Daughter     Social history:  reports that she has quit smoking. Her smoking use included Cigarettes. She smoked 0.00 packs per day. She has never used smokeless tobacco. She reports that she does not drink alcohol or use illicit drugs.    Allergies  Allergen Reactions  . Penicillins      Medications:  Current Outpatient Prescriptions on File Prior to Visit  Medication Sig Dispense Refill  . amLODipine (NORVASC) 5 MG tablet Take 1 tablet (5 mg total) by mouth daily.  30 tablet  0  . aspirin 325 MG tablet Take 325 mg by mouth daily.        . benazepril-hydrochlorthiazide (LOTENSIN HCT) 20-12.5 MG per tablet TAKE ONE TABLET BY MOUTH ONCE DAILY  30 tablet  3  . EPITOL 200 MG tablet TAKE ONE & ONE-HALF TABLETS BY MOUTH TWICE DAILY  90 tablet  11  . Multiple Vitamins-Minerals (ICAPS MV PO) Take by mouth daily.      . nitroGLYCERIN (NITROSTAT) 0.4 MG SL tablet Place 0.4 mg under the tongue every 5 (five) minutes as needed.        Vladimir Faster Glycol-Propyl Glycol 0.4-0.3 % SOLN Apply 1 drop to eye daily.      . potassium chloride (KLOR-CON 10) 10 MEQ tablet Take 1 tablet (10 mEq total) by mouth daily.  30 tablet  5  . simvastatin (ZOCOR) 20 MG tablet TAKE ONE TABLET BY MOUTH ONCE DAILY AT BEDTIME AS DIRECTED  30 tablet  3   No current facility-administered medications on file prior to visit.    ROS:  Out of a  complete 14 system review of symptoms, the patient complains only of the following symptoms, and all other reviewed systems are negative.  Excessive sweating Facial swelling, right cheek Eye redness Cough Rectal bleeding Daytime sleepiness Headache, seizures  Blood pressure 168/85, pulse 67, weight 140 lb (63.504 kg).  Physical Exam  General: The patient is alert and cooperative at the time of the examination.  Oral: A small area of redness and slight swelling is noted on the inside of the cheek on the right. No lacerations of the tongue are seen.  Skin: No significant peripheral edema is noted.   Neurologic Exam  Mental status: The patient is oriented x 3.  Cranial nerves: Facial symmetry is present. Speech is normal, no aphasia or dysarthria is noted. Extraocular movements are full. Visual fields are full.  Motor: The patient has good strength in all  4 extremities.  Sensory examination: Soft touch sensation is symmetric on the face, arms, and legs.  Coordination: The patient has good finger-nose-finger and heel-to-shin bilaterally.  Gait and station: The patient has a normal gait. Tandem gait is normal. Romberg is negative. No drift is seen.  Reflexes: Deep tendon reflexes are symmetric.   Assessment/Plan:  One. History of seizures, possible recent recurrence  Clinical examination today does show an area in the inside of the right cheek that could represent a bite. The patient did not bite her tongue. Given the onset of headache, a possible bitten right cheek, the patient is presumed to have had a nocturnal unwitnessed seizure. She was placed on low-dose Keppra, taking 250 mg twice daily. The patient is maximized on her carbamazepine dosing. She will followup in about 6 months. She will contact our office if any new problems arise.  Jill Alexanders MD 11/15/2013 7:49 PM  [facility] [address] [place],  [address]  Phone [phone] Fax [phone]

## 2013-11-23 NOTE — Telephone Encounter (Signed)
Noted  

## 2013-12-19 DIAGNOSIS — H259 Unspecified age-related cataract: Secondary | ICD-10-CM | POA: Diagnosis not present

## 2013-12-19 DIAGNOSIS — H40039 Anatomical narrow angle, unspecified eye: Secondary | ICD-10-CM | POA: Diagnosis not present

## 2013-12-19 DIAGNOSIS — H18509 Unspecified hereditary corneal dystrophies, unspecified eye: Secondary | ICD-10-CM | POA: Diagnosis not present

## 2014-01-09 ENCOUNTER — Other Ambulatory Visit: Payer: Self-pay | Admitting: *Deleted

## 2014-01-09 DIAGNOSIS — H60399 Other infective otitis externa, unspecified ear: Secondary | ICD-10-CM | POA: Diagnosis not present

## 2014-01-09 MED ORDER — POTASSIUM CHLORIDE ER 10 MEQ PO TBCR: 10.0000 meq | EXTENDED_RELEASE_TABLET | Freq: Every day | ORAL | Status: DC

## 2014-01-09 MED ORDER — BENAZEPRIL-HYDROCHLOROTHIAZIDE 20-12.5 MG PO TABS: ORAL_TABLET | ORAL | Status: DC

## 2014-01-15 ENCOUNTER — Other Ambulatory Visit: Payer: Self-pay | Admitting: *Deleted

## 2014-01-15 MED ORDER — SIMVASTATIN 20 MG PO TABS: ORAL_TABLET | ORAL | Status: DC

## 2014-03-07 ENCOUNTER — Encounter: Payer: Self-pay | Admitting: Neurology

## 2014-03-13 ENCOUNTER — Encounter: Payer: Self-pay | Admitting: Neurology

## 2014-05-17 ENCOUNTER — Encounter: Payer: Self-pay | Admitting: Adult Health

## 2014-05-17 ENCOUNTER — Ambulatory Visit (INDEPENDENT_AMBULATORY_CARE_PROVIDER_SITE_OTHER): Payer: Medicare Other | Admitting: Adult Health

## 2014-05-17 VITALS — BP 130/79 | HR 66 | Ht 61.0 in | Wt 136.0 lb

## 2014-05-17 DIAGNOSIS — Z5181 Encounter for therapeutic drug level monitoring: Secondary | ICD-10-CM | POA: Diagnosis not present

## 2014-05-17 DIAGNOSIS — G40409 Other generalized epilepsy and epileptic syndromes, not intractable, without status epilepticus: Secondary | ICD-10-CM

## 2014-05-17 DIAGNOSIS — G40309 Generalized idiopathic epilepsy and epileptic syndromes, not intractable, without status epilepticus: Secondary | ICD-10-CM

## 2014-05-17 DIAGNOSIS — G250 Essential tremor: Secondary | ICD-10-CM | POA: Diagnosis not present

## 2014-05-17 MED ORDER — LEVETIRACETAM 250 MG PO TABS: ORAL_TABLET | ORAL | Status: DC

## 2014-05-17 NOTE — Progress Notes (Signed)
I have read the note, and I agree with the clinical assessment and plan.  Robin Anderson   

## 2014-05-17 NOTE — Patient Instructions (Signed)
Increase Keppra to 1 tablet in the morning and 2 tablets before bed.  Continue Carbamazepine 1.5 tablets twice a day.  Try wearing a wrist splint at night to help with numbness. If your tremor in the hand worsens then we will consider medication.  If you have any additional seizures please let us know.

## 2014-05-17 NOTE — Progress Notes (Signed)
PATIENT: Robin Anderson DOB: [birth year]  REASON FOR VISIT: follow up- seizures- essential tremor HISTORY FROM: patient  HISTORY OF PRESENT ILLNESS: Robin Anderson is a 74 year old female with a history of seizures. She returns today for follow-up. She is currently taking carbamazepine and was recently started on Keppra. The patient states that in December she woke up and had bitten the right side of her mouth. Denies missing any medication or changing any medication. Not sure if she had another seizure in her sleep.  The patient does not operate a motor vehicle. At home she is able to complete all ADLs independently. Denies any changes with her gait or balance. 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 NCS in January 2015 show a mild carpal tunnel in the left hand. She states that she still has issues with it going numb during the night. She feels that the tremor has gotten worse. She notices it only when she is trying to do things with that hand. No new medical issues since last seen.  HISTORY 11/15/13 (WILLIS): [name] is a 74 year old right-handed black female with a history of seizures. She has done quite well on carbamazepine without any seizures for about 2 years, with recent trough blood levels of around 10. The patient awakened 2 days ago with a headache, and redness around the right eye. The patient noted that she had apparently bitten the inside of her cheek on the right side. The patient is concerned that she may have had a seizure at night while sleeping. She indicates that she sleeps with her husband, but he would be unlikely to wake up for anything at nighttime. The patient denies any soreness of the muscles, or new numbness or weakness. She comes into the office today for an evaluation. She had not missed any doses of her carbamazepine, and she has not been sick recently.  REVIEW OF SYSTEMS: Out of a complete 14 system review of symptoms, the  patient complains only of the following symptoms, and all other reviewed systems are negative.  Fatigue, facial swelling. Cough, leg swelling, urine decrease, headache, seizure, nervous/anxious, daytime sleepiness  ALLERGIES: Allergies  Allergen Reactions  . Penicillins     HOME MEDICATIONS: Outpatient Prescriptions Prior to Visit  Medication Sig Dispense Refill  . amLODipine (NORVASC) 5 MG tablet Take 1 tablet (5 mg total) by mouth daily. 30 tablet 0  . aspirin 325 MG tablet Take 325 mg by mouth daily.      . benazepril-hydrochlorthiazide (LOTENSIN HCT) 20-12.5 MG per tablet TAKE ONE TABLET BY MOUTH ONCE DAILY 30 tablet 6  . EPITOL 200 MG tablet TAKE ONE & ONE-HALF TABLETS BY MOUTH TWICE DAILY 90 tablet 11  . levETIRAcetam (KEPPRA) 250 MG tablet Take 1 tablet (250 mg total) by mouth 2 (two) times daily. 60 tablet 5  . Multiple Vitamins-Minerals (ICAPS MV PO) Take by mouth daily.    . potassium chloride (KLOR-CON 10) 10 MEQ tablet Take 1 tablet (10 mEq total) by mouth daily. 30 tablet 6  . simvastatin (ZOCOR) 20 MG tablet TAKE ONE TABLET BY MOUTH ONCE DAILY AT BEDTIME AS DIRECTED 30 tablet 6  . nitroGLYCERIN (NITROSTAT) 0.4 MG SL tablet Place 0.4 mg under the tongue every 5 (five) minutes as needed.      Vladimir Faster Glycol-Propyl Glycol 0.4-0.3 % SOLN Apply 1 drop to eye daily.     No facility-administered medications prior to visit.    PAST MEDICAL HISTORY: Past  Medical History  Diagnosis Date  . Chest pain, unspecified   . HTN (hypertension)   . HLD (hyperlipidemia)   . Seizure disorder   . Prediabetes 07/28/2011  . Heart murmur   . History of colon polyps   . Hepatitis   . Blood in stool     PAST SURGICAL HISTORY: Past Surgical History  Procedure Laterality Date  . Tubal ligation    . Abdominal hernia repair      lower mid line hernia which has reherniatied  . Bunionectomy Right     traumatic injury to her foot that was repaired  . Tonsillectomy      FAMILY  HISTORY: Family History  Problem Relation Age of Onset  . Hypertension Mother   . Heart attack Father   . Hypertension Sister     5 sisters....3 have HTN  . Hypertension Brother   . Breast cancer Sister   . Rectal cancer Sister   . Heart attack Daughter    PHYSICAL EXAM  Filed Vitals:   05/17/14 1107  BP: 130/79  Pulse: 66  Height: 5\' 1"  (1.549 m)  Weight: 136 lb (61.689 kg)   Body mass index is 25.71 kg/(m^2).  Generalized: Well developed, in no acute distress   Neurological examination  Mentation: Alert oriented to time, place, history taking. Follows all commands speech and language fluent Cranial nerve II-XII: Pupils were equal round reactive to light. Extraocular movements were full, visual field were full on confrontational test. Facial sensation and strength were normal. Uvula tongue midline. Head turning and shoulder shrug  were normal and symmetric. Motor: The motor testing reveals 5 over 5 strength of all 4 extremities. Good symmetric motor tone is noted throughout. Intention tremor noted in the right hand. Mild intention tremor noted in the left hand. Tinel sign negative in the right hand.  Sensory: Sensory testing is intact to soft touch on all 4 extremities. No evidence of extinction is noted.  Coordination: Cerebellar testing reveals good finger-nose-finger and heel-to-shin bilaterally.  Gait and station: Gait is normal. Tandem gait is normal. Romberg is negative. No drift is seen.  Reflexes: Deep tendon reflexes are symmetric and normal bilaterally.    DIAGNOSTIC DATA (LABS, IMAGING, TESTING) - I reviewed patient records, labs, notes, testing and imaging myself where available.  Lab Results  Component Value Date   WBC 6.1 08/03/2013   HGB 11.7 08/03/2013   HCT 36.8 08/03/2013   MCV 87 08/03/2013   PLT 221 08/03/2013      Component Value Date/Time   NA 143 08/03/2013 1331   NA 143 05/08/2013 0926   K 4.1 08/03/2013 1331   CL 99 08/03/2013 1331   CO2  27 08/03/2013 1331   GLUCOSE 69 08/03/2013 1331   GLUCOSE 77 05/08/2013 0926   BUN 17 08/03/2013 1331   BUN 21 05/08/2013 0926   CREATININE 0.77 08/03/2013 1331   CALCIUM 9.2 08/03/2013 1331   PROT 6.9 08/03/2013 1331   PROT 7.0 08/10/2012 1118   ALBUMIN 3.9 08/10/2012 1118   AST 16 08/03/2013 1331   ALT 12 08/03/2013 1331   ALKPHOS 119* 08/03/2013 1331   BILITOT <0.2 08/03/2013 1331   GFRNONAA 78 08/03/2013 1331   GFRAA 90 08/03/2013 1331   Lab Results  Component Value Date   CHOL 180 08/22/2013   HDL 52.10 08/22/2013   LDLCALC 111* 08/22/2013   TRIG 85.0 08/22/2013   CHOLHDL 3 08/22/2013   Lab Results  Component Value Date   HGBA1C 5.8  08/10/2012   No results found for: VITAMINB12 Lab Results  Component Value Date   TSH 1.620 08/03/2013      ASSESSMENT AND PLAN 74 y.o. year old female  has a past medical history of Chest pain, unspecified; HTN (hypertension); HLD (hyperlipidemia); Seizure disorder; Prediabetes (07/28/2011); Heart murmur; History of colon polyps; Hepatitis; and Blood in stool. here with:  1. Seizures 2. Essential tremor  Patient did wake up in December and had bitten the side of the mouth. This could represent a seizure event. I will increase her Keppra to 1 tablet in the morning and 2 tablets in the evening. She will continue the carbamazepine 1.5 tablets BID. I will check blood work today. Patient feels that the tremor in the right hand has gotten worse. She also has numbness at night in the hand. She states that she was diagnosis with carpal tunnel in the right hand? NCS in January did not indicate this although she did not have EMG with the study. I have recommended that she try using  a brace on the hand at night. As far as the tremor the patient's HR usually runs in the 60s. Not sure propranolol is a good choice. In the future we may consider mysoline. For now she will increase the Keppra. If she is unable to tolerate the increase or if she has another  seizure she will let me know. After being on the increased dose of Keppra for a couple of weeks she will call and let me know if she wants to try medication therapy for essential tremor. She will follow-up in April with Dr. Jannifer Franklin.   Ward Givens, MSN, NP-C 05/17/2014, 11:27 AM [facility] [address] [place], [place] [address] [phone]  Note: This document was prepared with digital dictation and possible smart phrase technology. Any transcriptional errors that result from this process are unintentional.

## 2014-05-18 ENCOUNTER — Telehealth: Payer: Self-pay | Admitting: Adult Health

## 2014-05-18 LAB — COMPREHENSIVE METABOLIC PANEL
ALT: 10 IU/L (ref 0–32)
AST: 15 IU/L (ref 0–40)
Albumin/Globulin Ratio: 1.6 (ref 1.1–2.5)
Albumin: 4.2 g/dL (ref 3.5–4.8)
Alkaline Phosphatase: 107 IU/L (ref 39–117)
BUN/Creatinine Ratio: 24 (ref 11–26)
BUN: 23 mg/dL (ref 8–27)
CO2: 27 mmol/L (ref 18–29)
Calcium: 9.3 mg/dL (ref 8.7–10.3)
Chloride: 105 mmol/L (ref 97–108)
Creatinine, Ser: 0.96 mg/dL (ref 0.57–1.00)
GFR calc Af Amer: 69 mL/min/{1.73_m2} (ref >59)
GFR calc non Af Amer: 60 mL/min/{1.73_m2} (ref >59)
Globulin, Total: 2.6 g/dL (ref 1.5–4.5)
Glucose: 63 mg/dL — ABNORMAL LOW (ref 65–99)
Potassium: 4 mmol/L (ref 3.5–5.2)
Sodium: 146 mmol/L — ABNORMAL HIGH (ref 134–144)
Total Bilirubin: <0.2 mg/dL (ref 0.0–1.2)
Total Protein: 6.8 g/dL (ref 6.0–8.5)

## 2014-05-18 LAB — CBC WITH DIFFERENTIAL/PLATELET
Basophils Absolute: 0 10*3/uL (ref 0.0–0.2)
Basos: 1 %
Eos: 6 %
Eosinophils Absolute: 0.4 10*3/uL (ref 0.0–0.4)
HCT: 35.7 % (ref 34.0–46.6)
Hemoglobin: 11.4 g/dL (ref 11.1–15.9)
Immature Grans (Abs): 0 10*3/uL (ref 0.0–0.1)
Immature Granulocytes: 0 %
Lymphocytes Absolute: 1.1 10*3/uL (ref 0.7–3.1)
Lymphs: 18 %
MCH: 27.2 pg (ref 26.6–33.0)
MCHC: 31.9 g/dL (ref 31.5–35.7)
MCV: 85 fL (ref 79–97)
Monocytes Absolute: 0.4 10*3/uL (ref 0.1–0.9)
Monocytes: 6 %
Neutrophils Absolute: 4.4 10*3/uL (ref 1.4–7.0)
Neutrophils Relative %: 69 %
Platelets: 177 10*3/uL (ref 150–379)
RBC: 4.19 x10E6/uL (ref 3.77–5.28)
RDW: 13.8 % (ref 12.3–15.4)
WBC: 6.3 10*3/uL (ref 3.4–10.8)

## 2014-05-18 LAB — CARBAMAZEPINE LEVEL, TOTAL: Carbamazepine Lvl: 1 ug/mL — ABNORMAL LOW (ref 4.0–12.0)

## 2014-05-18 NOTE — Telephone Encounter (Signed)
I called the patient. Her lab work showed a low carbamazepine level. Patient states that she has been taking the medication as prescribed. I reviewed again with the patient how she should be taking the medication. She verbalized understanding. Her keppra was increased at the last visit. Patient will let us know if she has any additional seizures. We will recheck carbamazepine level in 2-3 weeks.

## 2014-06-06 ENCOUNTER — Telehealth: Payer: Self-pay | Admitting: Adult Health

## 2014-06-06 DIAGNOSIS — Z5181 Encounter for therapeutic drug level monitoring: Secondary | ICD-10-CM

## 2014-06-06 NOTE — Telephone Encounter (Signed)
I called the patient. She can come in the morning to have her blood work drawn to recheck her carbamazepine level. The patient also states that she has been having nosebleeds. This morning she woke up at 3:00 and states that it was blood running down her face and when she coughed she had a big clot that came out. She states prior to this she had nosebleeds but has not been severe. According to the patient she experience nosebleeds before starting Keppra. I explained to the  patient that nosebleeds is not a common side effect of Keppra. Her seizures are currently controlled with Keppra and carbamazepine. I advised the patient to follow-up with her primary care provider to have her nosebleeds  evaluated. If no cause for the nosebleeds can be established in the future we can try decreasing the patient off of Keppra to see if her nosebleeds resolve.

## 2014-06-06 NOTE — Telephone Encounter (Signed)
Pt is calling to see if it is ok to come in the morning to have her labs rechecked for carbamazepine and also wants you to know that ever since she doubled the levETIRAcetam (KEPPRA) 250 MG tablet she has had a nose bleed.  Please call and advise.

## 2014-06-07 ENCOUNTER — Other Ambulatory Visit (INDEPENDENT_AMBULATORY_CARE_PROVIDER_SITE_OTHER): Payer: Self-pay

## 2014-06-07 DIAGNOSIS — Z0289 Encounter for other administrative examinations: Secondary | ICD-10-CM

## 2014-06-07 DIAGNOSIS — Z5181 Encounter for therapeutic drug level monitoring: Secondary | ICD-10-CM

## 2014-06-08 LAB — CARBAMAZEPINE LEVEL, TOTAL: Carbamazepine Lvl: 7.1 ug/mL (ref 4.0–12.0)

## 2014-06-21 DIAGNOSIS — H1852 Epithelial (juvenile) corneal dystrophy: Secondary | ICD-10-CM | POA: Diagnosis not present

## 2014-06-21 DIAGNOSIS — H04123 Dry eye syndrome of bilateral lacrimal glands: Secondary | ICD-10-CM | POA: Diagnosis not present

## 2014-06-21 DIAGNOSIS — H40053 Ocular hypertension, bilateral: Secondary | ICD-10-CM | POA: Diagnosis not present

## 2014-06-29 ENCOUNTER — Telehealth: Payer: Self-pay

## 2014-07-02 ENCOUNTER — Encounter: Payer: Self-pay | Admitting: Neurology

## 2014-07-02 ENCOUNTER — Ambulatory Visit (INDEPENDENT_AMBULATORY_CARE_PROVIDER_SITE_OTHER): Payer: Medicare Other | Admitting: Neurology

## 2014-07-02 VITALS — BP 179/74 | HR 59 | Ht 63.0 in | Wt 141.0 lb

## 2014-07-02 DIAGNOSIS — R51 Headache: Secondary | ICD-10-CM

## 2014-07-02 DIAGNOSIS — G441 Vascular headache, not elsewhere classified: Secondary | ICD-10-CM

## 2014-07-02 DIAGNOSIS — G40209 Localization-related (focal) (partial) symptomatic epilepsy and epileptic syndromes with complex partial seizures, not intractable, without status epilepticus: Secondary | ICD-10-CM | POA: Diagnosis not present

## 2014-07-02 DIAGNOSIS — G40409 Other generalized epilepsy and epileptic syndromes, not intractable, without status epilepticus: Secondary | ICD-10-CM

## 2014-07-02 DIAGNOSIS — G40309 Generalized idiopathic epilepsy and epileptic syndromes, not intractable, without status epilepticus: Secondary | ICD-10-CM | POA: Diagnosis not present

## 2014-07-02 DIAGNOSIS — R519 Headache, unspecified: Secondary | ICD-10-CM | POA: Insufficient documentation

## 2014-07-02 HISTORY — DX: Headache, unspecified: R51.9

## 2014-07-02 NOTE — Patient Instructions (Signed)

## 2014-07-02 NOTE — Progress Notes (Signed)
Reason for visit: Seizures  Robin Anderson is an 74 y.o. female  History of present illness:  Robin Anderson is a 74 year old right-handed black female with a history of a seizure disorder. The patient indicates that she had a probable seizure in December 2015, the patient woke up having bitten her cheek. Blood work done a month ago indicated that the carbamazepine level was low at 1.0, the patient generally will run a level between 7 and 10. The patient is now back on her carbamazepine, she denies missing a dose, a level drawn 3 weeks ago was therapeutic once again. The patient was increased on her Keppra dosing. She is tolerating medications fairly well. The patient indicates that beginning around August 2015, she began having daily headaches that are bifrontal in nature, these have continued. She indicates that headaches are unusual for her. She denies any nausea or vomiting with the headaches, she generally does not take medications with the headache. She denies any visual changes. She fell down a flight of stairs on 06/23/2014, and landed on her back and hit her head. She is now having some upper neck and occipital headaches as well. She has had some visual blurring, she recently saw her optometrist, and had a new prescription for her glasses given to her, but she has not filled the prescription yet.  Past Medical History  Diagnosis Date  . Chest pain, unspecified   . HTN (hypertension)   . HLD (hyperlipidemia)   . Seizure disorder   . Prediabetes 07/28/2011  . Heart murmur   . History of colon polyps   . Hepatitis   . Blood in stool   . Headache 07/02/2014    Past Surgical History  Procedure Laterality Date  . Tubal ligation    . Abdominal hernia repair      lower mid line hernia which has reherniatied  . Bunionectomy Right     traumatic injury to her foot that was repaired  . Tonsillectomy      Family History  Problem Relation Age of Onset  . Hypertension Mother   . Heart attack  Father   . Hypertension Sister     5 sisters....3 have HTN  . Hypertension Brother   . Breast cancer Sister   . Rectal cancer Sister   . Heart attack Daughter     Social history:  reports that she has quit smoking. Her smoking use included Cigarettes. She has never used smokeless tobacco. She reports that she does not drink alcohol or use illicit drugs.    Allergies  Allergen Reactions  . Penicillins     Medications:  Prior to Admission medications   Medication Sig Start Date End Date Taking? Authorizing Provider  amLODipine (NORVASC) 5 MG tablet Take 1 tablet (5 mg total) by mouth daily. 09/16/12  Yes Jearld Fenton, NP  aspirin 325 MG tablet Take 325 mg by mouth daily.     Yes Historical Provider, MD  benazepril-hydrochlorthiazide (LOTENSIN HCT) 20-12.5 MG per tablet TAKE ONE TABLET BY MOUTH ONCE DAILY 01/09/14  Yes Lelon Perla, MD  carbamazepine (TEGRETOL) 200 MG tablet Take 200 mg by mouth 2 (two) times daily. Take one and one half tabs two times a day.   Yes Historical Provider, MD  carboxymethylcellulose (REFRESH PLUS) 0.5 % SOLN 1 drop 3 (three) times daily as needed.   Yes Historical Provider, MD  levETIRAcetam (KEPPRA) 250 MG tablet Take 1 tablet PO in the AM and 2 tablets PO in the  PM 05/17/14  Yes Megan Spero Curb, NP  Multiple Vitamins-Minerals (ICAPS MV PO) Take by mouth daily.   Yes Historical Provider, MD  nitroGLYCERIN (NITROSTAT) 0.4 MG SL tablet Place 0.4 mg under the tongue every 5 (five) minutes as needed.     Yes Historical Provider, MD  potassium chloride (KLOR-CON 10) 10 MEQ tablet Take 1 tablet (10 mEq total) by mouth daily. 01/09/14  Yes Lelon Perla, MD  simvastatin (ZOCOR) 20 MG tablet TAKE ONE TABLET BY MOUTH ONCE DAILY AT BEDTIME AS DIRECTED 01/15/14  Yes Lelon Perla, MD    ROS:  Out of a complete 14 system review of symptoms, the patient complains only of the following symptoms, and all other reviewed systems are negative.  Excessive  sweating Blurred vision Cough Headache, seizures Daytime sleepiness  Blood pressure 179/74, pulse 59, height 5\' 3"  (1.6 m), weight 141 lb (63.957 kg).  Physical Exam  General: The patient is alert and cooperative at the time of the examination.  Skin: No significant peripheral edema is noted.   Neurologic Exam  Mental status: The patient is alert and oriented x 3 at the time of the examination. The patient has apparent normal recent and remote memory, with an apparently normal attention span and concentration ability.   Cranial nerves: Facial symmetry is present. Speech is normal, no aphasia or dysarthria is noted. Extraocular movements are full. Visual fields are full. Pupils are equal, round, and reactive to light. Discs are flat lightly.  Motor: The patient has good strength in all 4 extremities.  Sensory examination: Soft touch sensation is symmetric on the face, arms, and legs.  Coordination: The patient has good finger-nose-finger and heel-to-shin bilaterally.  Gait and station: The patient has a normal gait. Tandem gait is slightly unsteady. Romberg is negative. No drift is seen.  Reflexes: Deep tendon reflexes are symmetric.   Assessment/Plan:  1. History of seizures  2. New onset headache  The patient reports a new headache problem that has been daily since August 2015. She will be sent for blood work today, and MRI evaluation of the brain. She also has recently fallen and hit the back of her head on 06/23/2014 with occipital headaches, and some neck discomfort. The patient will continue her carbamazepine and Keppra at the current dose, she will follow-up in 6 months or sooner if needed.  Jill Alexanders MD 07/02/2014 7:57 PM  [facility] [address] [place], [place] [address]  Phone [phone] Fax [phone]

## 2014-07-02 NOTE — Telephone Encounter (Signed)
Patient states the doctor told her never to take the flu shot.

## 2014-07-03 LAB — SEDIMENTATION RATE: Sed Rate: 4 mm/hr (ref 0–40)

## 2014-07-05 ENCOUNTER — Other Ambulatory Visit: Payer: Self-pay

## 2014-07-05 MED ORDER — BENAZEPRIL-HYDROCHLOROTHIAZIDE 20-12.5 MG PO TABS: ORAL_TABLET | ORAL | Status: DC

## 2014-07-05 MED ORDER — SIMVASTATIN 20 MG PO TABS: ORAL_TABLET | ORAL | Status: DC

## 2014-07-12 ENCOUNTER — Ambulatory Visit
Admission: RE | Admit: 2014-07-12 | Discharge: 2014-07-12 | Disposition: A | Discharge status: Home / Self Care | Payer: Medicare Other | Source: Ambulatory Visit | Attending: Neurology | Admitting: Neurology

## 2014-07-12 DIAGNOSIS — G441 Vascular headache, not elsewhere classified: Secondary | ICD-10-CM

## 2014-07-12 DIAGNOSIS — G40409 Other generalized epilepsy and epileptic syndromes, not intractable, without status epilepticus: Secondary | ICD-10-CM

## 2014-07-12 DIAGNOSIS — G40309 Generalized idiopathic epilepsy and epileptic syndromes, not intractable, without status epilepticus: Secondary | ICD-10-CM | POA: Diagnosis not present

## 2014-07-13 ENCOUNTER — Telehealth: Payer: Self-pay | Admitting: Neurology

## 2014-07-13 NOTE — Telephone Encounter (Signed)
  I called patient. MRI the brain was unremarkable. I discussed this with the patient.  MRI brain 07/12/2014:  IMPRESSION:  Unremarkable MRI brain (without). Mild diffuse atrophy. No acute findings.

## 2014-08-06 ENCOUNTER — Ambulatory Visit: Payer: Medicare Other | Admitting: Neurology

## 2014-08-28 ENCOUNTER — Other Ambulatory Visit: Payer: Self-pay | Admitting: *Deleted

## 2014-08-28 ENCOUNTER — Ambulatory Visit (INDEPENDENT_AMBULATORY_CARE_PROVIDER_SITE_OTHER): Payer: Medicare Other | Admitting: Nurse Practitioner

## 2014-08-28 ENCOUNTER — Encounter: Payer: Self-pay | Admitting: Nurse Practitioner

## 2014-08-28 VITALS — BP 160/70 | HR 54 | Ht 63.0 in | Wt 138.1 lb

## 2014-08-28 DIAGNOSIS — I10 Essential (primary) hypertension: Secondary | ICD-10-CM

## 2014-08-28 DIAGNOSIS — E876 Hypokalemia: Secondary | ICD-10-CM

## 2014-08-28 LAB — CBC
HCT: 34 % — ABNORMAL LOW (ref 36.0–46.0)
Hemoglobin: 11.2 g/dL — ABNORMAL LOW (ref 12.0–15.0)
MCHC: 32.9 g/dL (ref 30.0–36.0)
MCV: 83.7 fl (ref 78.0–100.0)
Platelets: 182 10*3/uL (ref 150.0–400.0)
RBC: 4.07 Mil/uL (ref 3.87–5.11)
RDW: 13.4 % (ref 11.5–15.5)
WBC: 5.9 10*3/uL (ref 4.0–10.5)

## 2014-08-28 LAB — HEPATIC FUNCTION PANEL
ALT: 13 U/L (ref 0–35)
AST: 19 U/L (ref 0–37)
Albumin: 4 g/dL (ref 3.5–5.2)
Alkaline Phosphatase: 99 U/L (ref 39–117)
Bilirubin, Direct: 0.1 mg/dL (ref 0.0–0.3)
Total Bilirubin: 0.4 mg/dL (ref 0.2–1.2)
Total Protein: 7.2 g/dL (ref 6.0–8.3)

## 2014-08-28 LAB — BASIC METABOLIC PANEL
BUN: 16 mg/dL (ref 6–23)
CO2: 31 mEq/L (ref 19–32)
Calcium: 9.5 mg/dL (ref 8.4–10.5)
Chloride: 103 mEq/L (ref 96–112)
Creatinine, Ser: 0.95 mg/dL (ref 0.40–1.20)
GFR: 74.43 mL/min (ref >60.00)
Glucose, Bld: 100 mg/dL — ABNORMAL HIGH (ref 70–99)
Potassium: 3.3 mEq/L — ABNORMAL LOW (ref 3.5–5.1)
Sodium: 139 mEq/L (ref 135–145)

## 2014-08-28 LAB — LIPID PANEL
Cholesterol: 145 mg/dL (ref 0–200)
HDL: 57.5 mg/dL (ref >39.00)
LDL Cholesterol: 74 mg/dL (ref 0–99)
NonHDL: 87.5
Total CHOL/HDL Ratio: 3
Triglycerides: 66 mg/dL (ref 0.0–149.0)
VLDL: 13.2 mg/dL (ref 0.0–40.0)

## 2014-08-28 MED ORDER — POTASSIUM CHLORIDE ER 10 MEQ PO TBCR: 10.0000 meq | EXTENDED_RELEASE_TABLET | Freq: Two times a day (BID) | ORAL | Status: DC

## 2014-08-28 NOTE — Progress Notes (Addendum)
CARDIOLOGY OFFICE NOTE  Date:  08/28/2014    Michaele Offer Date of Birth: Mar 19, 1941 Medical Record #[id number]  PCP:  Stacy Gardner, PA-C  Cardiologist:  Stanford Breed  Chief Complaint  Patient presents with  . FU for HTN and HLD    Seen for Dr. Stanford Breed - former patient of Dr. Winnifred Friar.     History of Present Illness: Robin Anderson is a 74 y.o. female who presents today for a one year check. Former patient of Dr. Winnifred Friar. Was to establish with Dr. Stanford Breed. She has had atypical chest pain, HTN, HLD and glucose intolerance. Other issues as noted below and include seizure disorder. Normal Myoview from 2004 noted.   Last seen by me one year ago - needed medicines refilled.   Comes back today. Here alone. Doing well. Heart is doing ok. No chest pain. Having some issues with her right hand tremors - continues to follow with Dr. Jannifer Franklin for her seizures. Does not sound like she watches her salt very well. Tells me at the end of our visit that she is no longer taking her Tegretol - feels like it makes her bleed - she has been off of this for about 2 months - has not told Dr. Jannifer Franklin. BP better at home. Was 118/60 at home today. She checks fairly regularly. Needs her labs today.   Past Medical History  Diagnosis Date  . Chest pain, unspecified   . HTN (hypertension)   . HLD (hyperlipidemia)   . Seizure disorder   . Prediabetes 07/28/2011  . Heart murmur   . History of colon polyps   . Hepatitis   . Blood in stool   . Headache 07/02/2014    Past Surgical History  Procedure Laterality Date  . Tubal ligation    . Abdominal hernia repair      lower mid line hernia which has reherniatied  . Bunionectomy Right     traumatic injury to her foot that was repaired  . Tonsillectomy       Medications: Current Outpatient Prescriptions  Medication Sig Dispense Refill  . amLODipine (NORVASC) 5 MG tablet Take 1 tablet (5 mg total) by mouth daily. 30 tablet 0  . aspirin 325 MG tablet Take 325 mg  by mouth daily.      . benazepril-hydrochlorthiazide (LOTENSIN HCT) 20-12.5 MG per tablet TAKE ONE TABLET BY MOUTH ONCE DAILY 90 tablet 0  . levETIRAcetam (KEPPRA) 250 MG tablet Take 1 tablet PO in the AM and 2 tablets PO in the PM 90 tablet 3  . Multiple Vitamins-Minerals (ICAPS MV PO) Take by mouth daily.    . nitroGLYCERIN (NITROSTAT) 0.4 MG SL tablet Place 0.4 mg under the tongue every 5 (five) minutes as needed.      Vladimir Faster Glycol-Propyl Glycol (SYSTANE FREE OP) Apply to eye 3 (three) times daily.    . potassium chloride (KLOR-CON 10) 10 MEQ tablet Take 1 tablet (10 mEq total) by mouth daily. 30 tablet 6  . simvastatin (ZOCOR) 20 MG tablet TAKE ONE TABLET BY MOUTH ONCE DAILY AT BEDTIME AS DIRECTED 90 tablet 0   No current facility-administered medications for this visit.    Allergies: Allergies  Allergen Reactions  . Penicillins     Social History: The patient  reports that she has quit smoking. Her smoking use included Cigarettes. She has never used smokeless tobacco. She reports that she does not drink alcohol or use illicit drugs.   Family History: The patient's family history includes  Breast cancer in her sister; Heart attack in her daughter and father; Hypertension in her brother, mother, and sister; Rectal cancer in her sister.   Review of Systems: Please see the history of present illness.   Otherwise, the review of systems is positive for change in weight, hearing loss, cough, constipation, wheezing and sweating. Had a fall earlier this year and hit her head. Negative MRI.  All other systems are reviewed and negative.   Physical Exam: VS:  BP 160/70 mmHg  Pulse 54  Ht 5\' 3"  (1.6 m)  Wt 138 lb 1.9 oz (62.651 kg)  BMI 24.47 kg/m2  SpO2 100% .  BMI Body mass index is 24.47 kg/(m^2).  Wt Readings from Last 3 Encounters:  08/28/14 138 lb 1.9 oz (62.651 kg)  07/02/14 141 lb (63.957 kg)  05/17/14 136 lb (61.689 kg)    General: Pleasant. Well developed, well  nourished and in no acute distress.  HEENT: Normal. Neck: Supple, no JVD, carotid bruits, or masses noted.  Cardiac: Regular rate and rhythm. No murmurs, rubs, or gallops. No edema.  Respiratory:  Lungs are clear to auscultation bilaterally with normal work of breathing.  GI: Soft and nontender.  MS: No deformity or atrophy. Gait and ROM intact. Skin: Warm and dry. Color is normal.  Neuro:  Strength and sensation are intact and no gross focal deficits noted.  Psych: Alert, appropriate and with normal affect.   LABORATORY DATA:  EKG:  EKG is ordered today. This demonstrates sinus brady - rate of 47.  Lab Results  Component Value Date   WBC 6.3 05/17/2014   HGB 11.4 05/17/2014   HCT 35.7 05/17/2014   PLT 177 05/17/2014   GLUCOSE 63* 05/17/2014   CHOL 180 08/22/2013   TRIG 85.0 08/22/2013   HDL 52.10 08/22/2013   LDLCALC 111* 08/22/2013   ALT 10 05/17/2014   AST 15 05/17/2014   NA 146* 05/17/2014   K 4.0 05/17/2014   CL 105 05/17/2014   CREATININE 0.96 05/17/2014   BUN 23 05/17/2014   CO2 27 05/17/2014   TSH 1.620 08/03/2013   HGBA1C 5.8 08/10/2012    BNP (last 3 results) No results for input(s): BNP in the last 8760 hours.  ProBNP (last 3 results) No results for input(s): PROBNP in the last 8760 hours.   Other Studies Reviewed Today:  MRI IMPRESSION of brain 06/2014:  Unremarkable MRI brain (without). Mild diffuse atrophy. No acute findings.  Assessment/Plan: 1. HTN - good outpatient control. Has some degree of chronic white coat syndrome. She will continue to monitor. Asked her to limit her salt.  2. HLD - rechecking labs today  3. Seizure disorder - I have asked her to talk with Dr. Jannifer Franklin about her medicines  4. Bradycardia - totally asymptomatic.  Current medicines are reviewed with the patient today.  The patient does not have concerns regarding medicines other than what has been noted above.  The following changes have been made:  See above.  Labs/  tests ordered today include:    Orders Placed This Encounter  Procedures  . EKG 12-Lead     Disposition:   FU with me in 1 year.   Patient is agreeable to this plan and will call if any problems develop in the interim.   Signed: Burtis Junes, RN, ANP-C 08/28/2014 1:43 PM  [facility] [address] [place], [place]  [address] Phone: [phone] Fax: [phone]

## 2014-08-28 NOTE — Patient Instructions (Addendum)
We will be checking the following labs today - BMET, CBC lipids and HPF   Medication Instructions:    Continue with your current medicines.     Testing/Procedures To Be Arranged:  N/A  Follow-Up:   I will see you back in one year    Other Special Instructions:   Keep a check on your blood pressure  Limit your salt  Let Dr. Jannifer Franklin know that you are NOT taking your Tegretol  Call the [place] office at [phone] if you have any questions, problems or concerns.

## 2014-09-04 ENCOUNTER — Other Ambulatory Visit: Payer: Self-pay | Admitting: *Deleted

## 2014-09-04 ENCOUNTER — Other Ambulatory Visit (INDEPENDENT_AMBULATORY_CARE_PROVIDER_SITE_OTHER): Payer: Medicare Other

## 2014-09-04 DIAGNOSIS — E876 Hypokalemia: Secondary | ICD-10-CM | POA: Diagnosis not present

## 2014-09-04 LAB — BASIC METABOLIC PANEL
BUN: 15 mg/dL (ref 6–23)
CO2: 30 mEq/L (ref 19–32)
Calcium: 9.5 mg/dL (ref 8.4–10.5)
Chloride: 105 mEq/L (ref 96–112)
Creatinine, Ser: 1.03 mg/dL (ref 0.40–1.20)
GFR: 67.79 mL/min (ref >60.00)
Glucose, Bld: 86 mg/dL (ref 70–99)
Potassium: 3.4 mEq/L — ABNORMAL LOW (ref 3.5–5.1)
Sodium: 140 mEq/L (ref 135–145)

## 2014-09-04 MED ORDER — POTASSIUM CHLORIDE ER 20 MEQ PO TBCR: 30.0000 meq | EXTENDED_RELEASE_TABLET | Freq: Every day | ORAL | Status: DC

## 2014-09-07 ENCOUNTER — Other Ambulatory Visit: Payer: Self-pay

## 2014-09-07 MED ORDER — POTASSIUM CHLORIDE ER 20 MEQ PO TBCR: EXTENDED_RELEASE_TABLET | ORAL | Status: DC

## 2014-10-05 ENCOUNTER — Other Ambulatory Visit (INDEPENDENT_AMBULATORY_CARE_PROVIDER_SITE_OTHER): Payer: Medicare Other | Admitting: *Deleted

## 2014-10-05 DIAGNOSIS — E876 Hypokalemia: Secondary | ICD-10-CM | POA: Diagnosis not present

## 2014-10-05 LAB — BASIC METABOLIC PANEL
BUN: 23 mg/dL (ref 6–23)
CO2: 29 mEq/L (ref 19–32)
Calcium: 9.3 mg/dL (ref 8.4–10.5)
Chloride: 105 mEq/L (ref 96–112)
Creatinine, Ser: 0.99 mg/dL (ref 0.40–1.20)
GFR: 70.95 mL/min (ref >60.00)
Glucose, Bld: 91 mg/dL (ref 70–99)
Potassium: 3.5 mEq/L (ref 3.5–5.1)
Sodium: 140 mEq/L (ref 135–145)

## 2014-10-05 NOTE — Addendum Note (Signed)
Addended by: Eulis Foster on: 10/05/2014 09:24 AM   Modules accepted: Orders

## 2014-10-12 ENCOUNTER — Other Ambulatory Visit: Payer: Self-pay | Admitting: *Deleted

## 2014-10-12 ENCOUNTER — Telehealth: Payer: Self-pay | Admitting: Neurology

## 2014-10-12 MED ORDER — LEVETIRACETAM 250 MG PO TABS: ORAL_TABLET | ORAL | Status: DC

## 2014-10-12 MED ORDER — BENAZEPRIL-HYDROCHLOROTHIAZIDE 20-12.5 MG PO TABS: ORAL_TABLET | ORAL | Status: DC

## 2014-10-12 MED ORDER — SIMVASTATIN 20 MG PO TABS: ORAL_TABLET | ORAL | Status: DC

## 2014-10-12 NOTE — Telephone Encounter (Signed)
Rx has been sent.  Receipt confirmed by pharmacy.  I called back to advise.  Got no answer.  Left message.  

## 2014-10-12 NOTE — Telephone Encounter (Signed)
Patient called and requested a refill of Rx. levETIRAcetam (KEPPRA) 250 MG tablet be sent to [facility] [place], [place] - [address]. Please call and advise.

## 2014-10-25 DIAGNOSIS — H16223 Keratoconjunctivitis sicca, not specified as Sjogren's, bilateral: Secondary | ICD-10-CM | POA: Diagnosis not present

## 2014-10-25 DIAGNOSIS — H40053 Ocular hypertension, bilateral: Secondary | ICD-10-CM | POA: Diagnosis not present

## 2014-10-25 DIAGNOSIS — H1852 Epithelial (juvenile) corneal dystrophy: Secondary | ICD-10-CM | POA: Diagnosis not present

## 2014-12-31 ENCOUNTER — Ambulatory Visit (INDEPENDENT_AMBULATORY_CARE_PROVIDER_SITE_OTHER): Payer: Medicare Other | Admitting: Adult Health

## 2014-12-31 ENCOUNTER — Encounter: Payer: Self-pay | Admitting: Adult Health

## 2014-12-31 VITALS — BP 128/70 | HR 62 | Ht 63.0 in | Wt 142.0 lb

## 2014-12-31 DIAGNOSIS — G40309 Generalized idiopathic epilepsy and epileptic syndromes, not intractable, without status epilepticus: Secondary | ICD-10-CM

## 2014-12-31 DIAGNOSIS — G40409 Other generalized epilepsy and epileptic syndromes, not intractable, without status epilepticus: Secondary | ICD-10-CM

## 2014-12-31 NOTE — Patient Instructions (Signed)
Continue Keppra- take 1 tablet in the morning and 2 at bedtime If you have any seizure events please let me know.

## 2014-12-31 NOTE — Progress Notes (Signed)
PATIENT: Robin Anderson DOB: 04-15-1941  REASON FOR VISIT: follow up- seizures HISTORY FROM: patient  HISTORY OF PRESENT ILLNESS: Robin Anderson is a 74 year old female with a history of seizure disorder and headaches. She returns today for follow-up. She continues to take Keppra. However she states that she did decrease her dose to 1 tablet in the morning and 1 tablet in the evening. She was on carbamazepine however she states that she discontinued this. She states that she was having nosebleeds and she states that once she stopped the carbamazepine these have resolved. She also states that her headaches resolved when she stopped Carbamazepine. The patient denies any seizure events. She is able to complete all ADLs independently. She does not operate a motor vehicle. Overall she feels that she is doing very well. She returns today for an evaluation.   HISTORY  07/02/14: Robin Anderson is a 74 year old right-handed black female with a history of a seizure disorder. The patient indicates that she had a probable seizure in December 2015, the patient woke up having bitten her cheek. Blood work done a month ago indicated that the carbamazepine level was low at 1.0, the patient generally will run a level between 7 and 10. The patient is now back on her carbamazepine, she denies missing a dose, a level drawn 3 weeks ago was therapeutic once again. The patient was increased on her Keppra dosing. She is tolerating medications fairly well. The patient indicates that beginning around August 2015, she began having daily headaches that are bifrontal in nature, these have continued. She indicates that headaches are unusual for her. She denies any nausea or vomiting with the headaches, she generally does not take medications with the headache. She denies any visual changes. She fell down a flight of stairs on 06/23/2014, and landed on her back and hit her head. She is now having some upper neck and occipital headaches as well.  She has had some visual blurring, she recently saw her optometrist, and had a new prescription for her glasses given to her, but she has not filled the prescription yet.  REVIEW OF SYSTEMS: Out of a complete 14 system review of symptoms, the patient complains only of the following symptoms, and all other reviewed systems are negative.  Ringing in ears, blood in stool, cough, feeling hot, cramps, seizure, tremor  ALLERGIES: Allergies  Allergen Reactions  . Penicillins     HOME MEDICATIONS: Outpatient Prescriptions Prior to Visit  Medication Sig Dispense Refill  . aspirin 325 MG tablet Take 325 mg by mouth daily.      . benazepril-hydrochlorthiazide (LOTENSIN HCT) 20-12.5 MG per tablet TAKE ONE TABLET BY MOUTH ONCE DAILY 90 tablet 2  . levETIRAcetam (KEPPRA) 250 MG tablet Take 1 tablet PO in the AM and 2 tablets PO in the PM 90 tablet 3  . Multiple Vitamins-Minerals (ICAPS MV PO) Take by mouth daily.    . Potassium Chloride ER 20 MEQ TBCR Take 1 & 1/2 pill daily 60 tablet 3  . simvastatin (ZOCOR) 20 MG tablet TAKE ONE TABLET BY MOUTH ONCE DAILY AT BEDTIME AS DIRECTED 90 tablet 2  . amLODipine (NORVASC) 5 MG tablet Take 1 tablet (5 mg total) by mouth daily. (Patient not taking: Reported on 12/31/2014) 30 tablet 0  . nitroGLYCERIN (NITROSTAT) 0.4 MG SL tablet Place 0.4 mg under the tongue every 5 (five) minutes as needed.      Vladimir Faster Glycol-Propyl Glycol (SYSTANE FREE OP) Apply to eye 3 (three) times daily.  No facility-administered medications prior to visit.    PAST MEDICAL HISTORY: Past Medical History  Diagnosis Date  . Chest pain, unspecified   . HTN (hypertension)   . HLD (hyperlipidemia)   . Seizure disorder   . Prediabetes 07/28/2011  . Heart murmur   . History of colon polyps   . Hepatitis   . Blood in stool   . Headache 07/02/2014    PAST SURGICAL HISTORY: Past Surgical History  Procedure Laterality Date  . Tubal ligation    . Abdominal hernia repair       lower mid line hernia which has reherniatied  . Bunionectomy Right     traumatic injury to her foot that was repaired  . Tonsillectomy      FAMILY HISTORY: Family History  Problem Relation Age of Onset  . Hypertension Mother   . Heart attack Father   . Hypertension Sister     5 sisters....3 have HTN  . Hypertension Brother   . Breast cancer Sister   . Rectal cancer Sister   . Heart attack Daughter     SOCIAL HISTORY: Social History   Social History  . Marital Status: Married    Spouse Name: [name]  . Number of Children: 6  . Years of Education: 12   Occupational History  . retired    Social History Main Topics  . Smoking status: Former Smoker    Types: Cigarettes  . Smokeless tobacco: Never Used  . Alcohol Use: No  . Drug Use: No  . Sexual Activity: Not on file   Other Topics Concern  . Not on file   Social History Narrative   Patient lives at home with her husband Elea Holtzclaw.   Retired.   Education high school.   Right handed..      Regular exercise-yes   Caffeine Use-yes      PHYSICAL EXAM  Filed Vitals:   12/31/14 0959  BP: 128/70  Pulse: 62  Height: 5\' 3"  (1.6 m)  Weight: 142 lb (64.411 kg)   Body mass index is 25.16 kg/(m^2).  Generalized: Well developed, in no acute distress   Neurological examination  Mentation: Alert oriented to time, place, history taking. Follows all commands speech and language fluent Cranial nerve II-XII: Pupils were equal round reactive to light. Extraocular movements were full, visual field were full on confrontational test. Facial sensation and strength were normal. Uvula tongue midline. Head turning and shoulder shrug  were normal and symmetric. Motor: The motor testing reveals 5 over 5 strength of all 4 extremities. Good symmetric motor tone is noted throughout.  Sensory: Sensory testing is intact to soft touch on all 4 extremities. No evidence of extinction is noted.  Coordination: Cerebellar testing  reveals good finger-nose-finger and heel-to-shin bilaterally.  Gait and station: Gait is normal. Tandem gait is normal. Romberg is negative. No drift is seen.  Reflexes: Deep tendon reflexes are symmetric and normal bilaterally.   DIAGNOSTIC DATA (LABS, IMAGING, TESTING) - I reviewed patient records, labs, notes, testing and imaging myself where available.  Lab Results  Component Value Date   WBC 5.9 08/28/2014   HGB 11.2* 08/28/2014   HCT 34.0* 08/28/2014   MCV 83.7 08/28/2014   PLT 182.0 08/28/2014      Component Value Date/Time   NA 140 10/05/2014 0929   NA 146* 05/17/2014 1233   K 3.5 10/05/2014 0929   CL 105 10/05/2014 0929   CO2 29 10/05/2014 0929   GLUCOSE 91 10/05/2014 0929  GLUCOSE 63* 05/17/2014 1233   BUN 23 10/05/2014 0929   BUN 23 05/17/2014 1233   CREATININE 0.99 10/05/2014 0929   CALCIUM 9.3 10/05/2014 0929   PROT 7.2 08/28/2014 1355   PROT 6.8 05/17/2014 1233   ALBUMIN 4.0 08/28/2014 1355   AST 19 08/28/2014 1355   ALT 13 08/28/2014 1355   ALKPHOS 99 08/28/2014 1355   BILITOT 0.4 08/28/2014 1355   GFRNONAA 60 05/17/2014 1233   GFRAA 69 05/17/2014 1233    ASSESSMENT AND PLAN 74 y.o. year old female  has a past medical history of Chest pain, unspecified; HTN (hypertension); HLD (hyperlipidemia); Seizure disorder; Prediabetes (07/28/2011); Heart murmur; History of colon polyps; Hepatitis; Blood in stool; and Headache (07/02/2014). here with:  1. Seizures  The patient has done well since stopping carbamazepine on her own. I have advised patient that I would take Keppra as prescribed. She verbalized understanding. She will take Keppra 1 tablet in the morning and 2 tablets in the evening. Patient advised that if she has any seizure event she will let us know. She will follow-up in 6 months or sooner if needed.  Ward Givens, MSN, NP-C 12/31/2014, 10:09 AM [facility] [address] [place], [place] [address] [phone]

## 2014-12-31 NOTE — Progress Notes (Signed)
I have read the note, and I agree with the clinical assessment and plan.  Hjalmar Ballengee KEITH   

## 2015-01-25 DIAGNOSIS — H16223 Keratoconjunctivitis sicca, not specified as Sjogren's, bilateral: Secondary | ICD-10-CM | POA: Diagnosis not present

## 2015-01-25 DIAGNOSIS — H1852 Epithelial (juvenile) corneal dystrophy: Secondary | ICD-10-CM | POA: Diagnosis not present

## 2015-01-25 DIAGNOSIS — H40053 Ocular hypertension, bilateral: Secondary | ICD-10-CM | POA: Diagnosis not present

## 2015-03-29 DIAGNOSIS — H40053 Ocular hypertension, bilateral: Secondary | ICD-10-CM | POA: Diagnosis not present

## 2015-03-29 DIAGNOSIS — H1852 Epithelial (juvenile) corneal dystrophy: Secondary | ICD-10-CM | POA: Diagnosis not present

## 2015-03-29 DIAGNOSIS — H16233 Neurotrophic keratoconjunctivitis, bilateral: Secondary | ICD-10-CM | POA: Diagnosis not present

## 2015-04-17 ENCOUNTER — Other Ambulatory Visit: Payer: Self-pay | Admitting: Neurology

## 2015-04-18 ENCOUNTER — Encounter: Payer: Self-pay | Admitting: *Deleted

## 2015-07-02 ENCOUNTER — Ambulatory Visit: Payer: Medicare Other | Admitting: Adult Health

## 2015-07-11 ENCOUNTER — Other Ambulatory Visit: Payer: Self-pay | Admitting: Cardiology

## 2015-07-15 ENCOUNTER — Encounter: Payer: Self-pay | Admitting: Adult Health

## 2015-07-15 ENCOUNTER — Ambulatory Visit (INDEPENDENT_AMBULATORY_CARE_PROVIDER_SITE_OTHER): Payer: Medicare Other | Admitting: Adult Health

## 2015-07-15 VITALS — BP 140/80 | HR 60 | Resp 16 | Ht 63.0 in | Wt 147.0 lb

## 2015-07-15 DIAGNOSIS — G250 Essential tremor: Secondary | ICD-10-CM | POA: Diagnosis not present

## 2015-07-15 DIAGNOSIS — R569 Unspecified convulsions: Secondary | ICD-10-CM | POA: Diagnosis not present

## 2015-07-15 MED ORDER — LEVETIRACETAM 250 MG PO TABS: 250.0000 mg | ORAL_TABLET | Freq: Two times a day (BID) | ORAL | Status: DC

## 2015-07-15 NOTE — Patient Instructions (Signed)
Continue Keppra 250 mg twice a day If you have any seizure events please let us know If your symptoms worsen or you develop new symptoms please let us know.

## 2015-07-15 NOTE — Progress Notes (Signed)
I have read the note, and I agree with the clinical assessment and plan.  Theseus Birnie KEITH   

## 2015-07-15 NOTE — Progress Notes (Signed)
PATIENT: Robin Anderson DOB: [birth year]  REASON FOR VISIT: follow up- seizure disorder HISTORY FROM: patient  HISTORY OF PRESENT ILLNESS: Robin Anderson is a 75 year old female with a history of seizure disorder. She returns today for follow-up. She is currently taking Keppra 1 tablet in the morning and 1 tablet in the evening. She states that she is tolerating this medication well. She denies any recent seizure events. She is able to complete all ADLs independently. She does not operate a motor vehicle. She denies any changes with her gait or balance. Denies any changes with her mood or behavior. She states that back in October she was at the kitchen sink cleaning food and her right hand drew up and she had some numbness and tingling. She states that she has carpal tunnel in that hand. She states that she also will have a tremor primarily in the right hand but sometimes it will affect the left hand. Overall she feels that she is doing well. She returns today for an evaluation.  Update 12/31/14 (MM): Robin Anderson is a 75 year old female with a history of seizure disorder and headaches. She returns today for follow-up. She continues to take Keppra. However she states that she did decrease her dose to 1 tablet in the morning and 1 tablet in the evening. She was on carbamazepine however she states that she discontinued this. She states that she was having nosebleeds and she states that once she stopped the carbamazepine these have resolved. She also states that her headaches resolved when she stopped Carbamazepine. The patient denies any seizure events. She is able to complete all ADLs independently. She does not operate a motor vehicle. Overall she feels that she is doing very well. She returns today for an evaluation.   HISTORY  07/02/14: Robin Anderson is a 75 year old right-handed black female with a history of a seizure disorder. The patient indicates that she had a probable seizure in December 2015, the patient  woke up having bitten her cheek. Blood work done a month ago indicated that the carbamazepine level was low at 1.0, the patient generally will run a level between 7 and 10. The patient is now back on her carbamazepine, she denies missing a dose, a level drawn 3 weeks ago was therapeutic once again. The patient was increased on her Keppra dosing. She is tolerating medications fairly well. The patient indicates that beginning around August 2015, she began having daily headaches that are bifrontal in nature, these have continued. She indicates that headaches are unusual for her. She denies any nausea or vomiting with the headaches, she generally does not take medications with the headache. She denies any visual changes. She fell down a flight of stairs on 06/23/2014, and landed on her back and hit her head. She is now having some upper neck and occipital headaches as well. She has had some visual blurring, she recently saw her optometrist, and had a new prescription for her glasses given to her, but she has not filled the prescription yet.  REVIEW OF SYSTEMS: Out of a complete 14 system review of symptoms, the patient complains only of the following symptoms, and all other reviewed systems are negative.  Excessive sweating, facial swelling, eye discharge, blurred vision, cough, daytime sleepiness, walking difficulty, nervous  ALLERGIES: Allergies  Allergen Reactions  . Penicillins     HOME MEDICATIONS: Outpatient Prescriptions Prior to Visit  Medication Sig Dispense Refill  . aspirin 325 MG tablet Take 325 mg by mouth daily.      Marland Kitchen  benazepril-hydrochlorthiazide (LOTENSIN HCT) 20-12.5 MG tablet TAKE ONE TABLET BY MOUTH ONCE DAILY 90 tablet 1  . levETIRAcetam (KEPPRA) 250 MG tablet TAKE ONE TABLET BY MOUTH IN THE MORNING AND TWO TABLETS IN THE EVENING 90 tablet 3  . Multiple Vitamins-Minerals (ICAPS MV PO) Take by mouth daily.    Vladimir Faster Glycol-Propyl Glycol (SYSTANE ULTRA PF) 0.4-0.3 % SOLN Apply  to eye as directed.    . Potassium Chloride ER 20 MEQ TBCR Take 1 & 1/2 pill daily 60 tablet 3  . simvastatin (ZOCOR) 20 MG tablet TAKE ONE TABLET BY MOUTH ONCE DAILY AT BEDTIME AS DIRECTED 90 tablet 1   No facility-administered medications prior to visit.    PAST MEDICAL HISTORY: Past Medical History  Diagnosis Date  . Chest pain, unspecified   . HTN (hypertension)   . HLD (hyperlipidemia)   . Seizure disorder   . Prediabetes 07/28/2011  . Heart murmur   . History of colon polyps   . Hepatitis   . Blood in stool   . Headache 07/02/2014    PAST SURGICAL HISTORY: Past Surgical History  Procedure Laterality Date  . Tubal ligation    . Abdominal hernia repair      lower mid line hernia which has reherniatied  . Bunionectomy Right     traumatic injury to her foot that was repaired  . Tonsillectomy      FAMILY HISTORY: Family History  Problem Relation Age of Onset  . Hypertension Mother   . Heart attack Father   . Hypertension Sister     5 sisters....3 have HTN  . Hypertension Brother   . Breast cancer Sister   . Rectal cancer Sister   . Heart attack Daughter     SOCIAL HISTORY: Social History   Social History  . Marital Status: Married    Spouse Name: [name]  . Number of Children: 6  . Years of Education: 12   Occupational History  . retired    Social History Main Topics  . Smoking status: Former Smoker    Types: Cigarettes  . Smokeless tobacco: Never Used  . Alcohol Use: No  . Drug Use: No  . Sexual Activity: Not on file   Other Topics Concern  . Not on file   Social History Narrative   Patient lives at home with her husband Alberta Schaffert.   Retired.   Education high school.   Right handed..      Regular exercise-yes   Caffeine Use-yes      PHYSICAL EXAM  Filed Vitals:   07/15/15 1035  BP: 140/80  Pulse: 60  Resp: 16  Height: 5\' 3"  (1.6 m)  Weight: 147 lb (66.679 kg)   Body mass index is 26.05 kg/(m^2).  Generalized: Well  developed, in no acute distress   Neurological examination  Mentation: Alert oriented to time, place, history taking. Follows all commands speech and language fluent Cranial nerve II-XII: Pupils were equal round reactive to light. Extraocular movements were full, visual field were full on confrontational test. Facial sensation and strength were normal. Uvula tongue midline. Head turning and shoulder shrug  were normal and symmetric. Motor: The motor testing reveals 5 over 5 strength of all 4 extremities. Good symmetric motor tone is noted throughout. Mild intention tremor noted in the hands. Right greater than left. Sensory: Sensory testing is intact to soft touch on all 4 extremities. No evidence of extinction is noted.  Coordination: Cerebellar testing reveals good finger-nose-finger and heel-to-shin bilaterally.  Gait and station: Gait is normal. Tandem gait is normal. Romberg is negative. No drift is seen.  Reflexes: Deep tendon reflexes are symmetric and normal bilaterally.   DIAGNOSTIC DATA (LABS, IMAGING, TESTING) - I reviewed patient records, labs, notes, testing and imaging myself where available.  Lab Results  Component Value Date   WBC 5.9 08/28/2014   HGB 11.2* 08/28/2014   HCT 34.0* 08/28/2014   MCV 83.7 08/28/2014   PLT 182.0 08/28/2014      Component Value Date/Time   NA 140 10/05/2014 0929   NA 146* 05/17/2014 1233   K 3.5 10/05/2014 0929   CL 105 10/05/2014 0929   CO2 29 10/05/2014 0929   GLUCOSE 91 10/05/2014 0929   GLUCOSE 63* 05/17/2014 1233   BUN 23 10/05/2014 0929   BUN 23 05/17/2014 1233   CREATININE 0.99 10/05/2014 0929   CALCIUM 9.3 10/05/2014 0929   PROT 7.2 08/28/2014 1355   PROT 6.8 05/17/2014 1233   ALBUMIN 4.0 08/28/2014 1355   ALBUMIN 4.2 05/17/2014 1233   AST 19 08/28/2014 1355   ALT 13 08/28/2014 1355   ALKPHOS 99 08/28/2014 1355   BILITOT 0.4 08/28/2014 1355   GFRNONAA 60 05/17/2014 1233   GFRAA 69 05/17/2014 1233   Lab Results    Component Value Date   CHOL 145 08/28/2014   HDL 57.50 08/28/2014   LDLCALC 74 08/28/2014   TRIG 66.0 08/28/2014   CHOLHDL 3 08/28/2014       ASSESSMENT AND PLAN 75 y.o. year old female  has a past medical history of Chest pain, unspecified; HTN (hypertension); HLD (hyperlipidemia); Seizure disorder; Prediabetes (07/28/2011); Heart murmur; History of colon polyps; Hepatitis; Blood in stool; and Headache (07/02/2014). here with:  1. Seizure disorder 2. Essential tremor  Overall the patient is doing well. She will continue on Keppra 250 mg twice a day. Patient advised that if she has any seizure events she should let us know. The patient does have a mild intention tremor in the hands right greater than left. We will continue to monitor this. She will follow-up in 6 months or sooner if needed.  Ward Givens, MSN, NP-C 07/15/2015, 10:31 AM [facility] [address], [place], [place] [address] [phone]

## 2015-08-27 ENCOUNTER — Encounter: Payer: Self-pay | Admitting: Nurse Practitioner

## 2015-08-27 ENCOUNTER — Ambulatory Visit (INDEPENDENT_AMBULATORY_CARE_PROVIDER_SITE_OTHER): Payer: Medicare Other | Admitting: Nurse Practitioner

## 2015-08-27 VITALS — BP 168/86 | HR 46 | Ht 63.0 in | Wt 148.8 lb

## 2015-08-27 DIAGNOSIS — E785 Hyperlipidemia, unspecified: Secondary | ICD-10-CM

## 2015-08-27 DIAGNOSIS — I10 Essential (primary) hypertension: Secondary | ICD-10-CM

## 2015-08-27 LAB — CBC
HCT: 34.6 % — ABNORMAL LOW (ref 35.0–45.0)
Hemoglobin: 11 g/dL — ABNORMAL LOW (ref 11.7–15.5)
MCH: 26.6 pg — ABNORMAL LOW (ref 27.0–33.0)
MCHC: 31.8 g/dL — ABNORMAL LOW (ref 32.0–36.0)
MCV: 83.6 fL (ref 80.0–100.0)
MPV: 12 fL (ref 7.5–12.5)
Platelets: 199 10*3/uL (ref 140–400)
RBC: 4.14 MIL/uL (ref 3.80–5.10)
RDW: 14 % (ref 11.0–15.0)
WBC: 7.1 10*3/uL (ref 3.8–10.8)

## 2015-08-27 MED ORDER — BENAZEPRIL-HYDROCHLOROTHIAZIDE 20-12.5 MG PO TABS
1.0000 | ORAL_TABLET | Freq: Every day | ORAL | Status: DC
Start: 2015-08-27 — End: 2016-05-21

## 2015-08-27 MED ORDER — SIMVASTATIN 20 MG PO TABS: ORAL_TABLET | ORAL | Status: DC

## 2015-08-27 NOTE — Patient Instructions (Addendum)
We will be checking the following labs today - BMET, HPF and Lipids   Medication Instructions:    Continue with your current medicines. I sent in your refills today.     Testing/Procedures To Be Arranged:  N/A  Follow-Up:   See [place] back in one year.     Other Special Instructions:   Continue to monitor your blood pressure at home.     If you need a refill on your cardiac medications before your next appointment, please call your pharmacy.   Call the [place] office at [phone] if you have any questions, problems or concerns.

## 2015-08-27 NOTE — Progress Notes (Signed)
CARDIOLOGY OFFICE NOTE  Date:  08/27/2015    Robin Anderson Date of Birth: 1940-12-28 Medical Record [id number]  PCP:  No PCP Per Patient  Cardiologist:  Former patient of Dr. Verl Blalock - seen for Dr. Stanford Breed.     Chief Complaint  Patient presents with  . Hypertension  . Hyperlipidemia    1 year check - seen for Dr. Stanford Breed    History of Present Illness: Robin Anderson is a 75 y.o. female who presents today for a one year check. Former patient of Dr. Winnifred Friar. Was to establish with Dr. Stanford Breed. She has had atypical chest pain, HTN, HLD and glucose intolerance. Other issues as noted below and include seizure disorder. Remote normal Myoview from 2004 noted.   Last seen by me one year ago - needed medicines refilled and her labs. Had stopped her Tegretol and I advised her to alert neurology.   Comes back today. Here alone. She is doing ok. Feels good. No chest pain. Not short of breath. No swelling. Staying active. BP is much better at home and she monitors it regularly. She is happy with how she is doing. Needs meds refilled and labs today.   Past Medical History  Diagnosis Date  . Chest pain, unspecified   . HTN (hypertension)   . HLD (hyperlipidemia)   . Seizure disorder ([place])   . Prediabetes 07/28/2011  . Heart murmur   . History of colon polyps   . Hepatitis   . Blood in stool   . Headache 07/02/2014    Past Surgical History  Procedure Laterality Date  . Tubal ligation    . Abdominal hernia repair      lower mid line hernia which has reherniatied  . Bunionectomy Right     traumatic injury to her foot that was repaired  . Tonsillectomy       Medications: Current Outpatient Prescriptions  Medication Sig Dispense Refill  . aspirin 325 MG tablet Take 325 mg by mouth daily.      . benazepril-hydrochlorthiazide (LOTENSIN HCT) 20-12.5 MG tablet Take 1 tablet by mouth daily. 90 tablet 3  . levETIRAcetam (KEPPRA) 250 MG tablet Take 1 tablet (250 mg total) by mouth 2 (two)  times daily. 60 tablet 11  . Multiple Vitamins-Minerals (ICAPS MV PO) Take by mouth daily.    Vladimir Faster Glycol-Propyl Glycol (SYSTANE) 0.4-0.3 % SOLN Apply to eye.    . simvastatin (ZOCOR) 20 MG tablet TAKE ONE TABLET BY MOUTH ONCE DAILY AT BEDTIME AS DIRECTED 90 tablet 3   No current facility-administered medications for this visit.    Allergies: Allergies  Allergen Reactions  . Penicillins     Social History: The patient  reports that she has quit smoking. Her smoking use included Cigarettes. She has never used smokeless tobacco. She reports that she does not drink alcohol or use illicit drugs.   Family History: The patient's family history includes Breast cancer in her sister; Heart attack in her daughter and father; Hypertension in her brother, mother, and sister; Rectal cancer in her sister.   Review of Systems: Please see the history of present illness.   Otherwise, the review of systems is positive for none.   All other systems are reviewed and negative.   Physical Exam: VS:  BP 168/86 mmHg  Pulse 46  Ht 5\' 3"  (1.6 m)  Wt 148 lb 12.8 oz (67.495 kg)  BMI 26.37 kg/m2 .  BMI Body mass index is 26.37 kg/(m^2).  Wt Readings  from Last 3 Encounters:  08/27/15 148 lb 12.8 oz (67.495 kg)  07/15/15 147 lb (66.679 kg)  12/31/14 142 lb (64.411 kg)   BP is 160/80 by me.   General: Pleasant. Well developed, well nourished and in no acute distress.  HEENT: Normal. Neck: Supple, no JVD, carotid bruits, or masses noted.  Cardiac: Regular rate and rhythm. No murmurs, rubs, or gallops. No edema.  Respiratory:  Lungs are clear to auscultation bilaterally with normal work of breathing.  GI: Soft and nontender.  MS: No deformity or atrophy. Gait and ROM intact. Skin: Warm and dry. Color is normal.  Neuro:  Strength and sensation are intact and no gross focal deficits noted.  Psych: Alert, appropriate and with normal affect.   LABORATORY DATA:  EKG:  EKG is not ordered  today.  Lab Results  Component Value Date   WBC 5.9 08/28/2014   HGB 11.2* 08/28/2014   HCT 34.0* 08/28/2014   PLT 182.0 08/28/2014   GLUCOSE 91 10/05/2014   CHOL 145 08/28/2014   TRIG 66.0 08/28/2014   HDL 57.50 08/28/2014   LDLCALC 74 08/28/2014   ALT 13 08/28/2014   AST 19 08/28/2014   NA 140 10/05/2014   K 3.5 10/05/2014   CL 105 10/05/2014   CREATININE 0.99 10/05/2014   BUN 23 10/05/2014   CO2 29 10/05/2014   TSH 1.620 08/03/2013   HGBA1C 5.8 08/10/2012    BNP (last 3 results) No results for input(s): BNP in the last 8760 hours.  ProBNP (last 3 results) No results for input(s): PROBNP in the last 8760 hours.   Other Studies Reviewed Today:  MRI IMPRESSION of brain 06/2014:  Unremarkable MRI brain (without). Mild diffuse atrophy. No acute findings.  Assessment/Plan: 1. HTN - good outpatient control. Has some degree of chronic white coat syndrome. I have left her on her current regimen. Meds refilled today.   2. HLD - rechecking labs today  3. Seizure disorder - on Keppra  4. Right arm tremor - followed by neurology  5. Asymptomatic bradycardia  Current medicines are reviewed with the patient today.  The patient does not have concerns regarding medicines other than what has been noted above.  The following changes have been made:  See above.  Labs/ tests ordered today include:    Orders Placed This Encounter  Procedures  . Basic metabolic panel  . CBC  . Hepatic function panel  . Lipid panel     Disposition:   FU in one year.   Patient is agreeable to this plan and will call if any problems develop in the interim.   Signed: Burtis Junes, RN, ANP-C 08/27/2015 1:51 PM  [facility] [address] [place], [place]  [address] Phone: [phone] Fax: [phone]

## 2015-08-28 ENCOUNTER — Other Ambulatory Visit: Payer: Self-pay | Admitting: *Deleted

## 2015-08-28 DIAGNOSIS — E876 Hypokalemia: Secondary | ICD-10-CM

## 2015-08-28 LAB — LIPID PANEL
Cholesterol: 140 mg/dL (ref 125–200)
HDL: 64 mg/dL (ref >=46)
LDL Cholesterol: 66 mg/dL (ref <130)
Total CHOL/HDL Ratio: 2.2 Ratio (ref <=5.0)
Triglycerides: 51 mg/dL (ref <150)
VLDL: 10 mg/dL (ref <30)

## 2015-08-28 LAB — HEPATIC FUNCTION PANEL
ALT: 14 U/L (ref 6–29)
AST: 19 U/L (ref 10–35)
Albumin: 3.8 g/dL (ref 3.6–5.1)
Alkaline Phosphatase: 89 U/L (ref 33–130)
Bilirubin, Direct: 0.1 mg/dL (ref <=0.2)
Indirect Bilirubin: 0.3 mg/dL (ref 0.2–1.2)
Total Bilirubin: 0.4 mg/dL (ref 0.2–1.2)
Total Protein: 6.8 g/dL (ref 6.1–8.1)

## 2015-08-28 LAB — BASIC METABOLIC PANEL
BUN: 16 mg/dL (ref 7–25)
CO2: 27 mmol/L (ref 20–31)
Calcium: 8.9 mg/dL (ref 8.6–10.4)
Chloride: 103 mmol/L (ref 98–110)
Creat: 1.02 mg/dL — ABNORMAL HIGH (ref 0.60–0.93)
Glucose, Bld: 79 mg/dL (ref 65–99)
Potassium: 3.3 mmol/L — ABNORMAL LOW (ref 3.5–5.3)
Sodium: 141 mmol/L (ref 135–146)

## 2015-08-28 MED ORDER — POTASSIUM CHLORIDE CRYS ER 20 MEQ PO TBCR: 20.0000 meq | EXTENDED_RELEASE_TABLET | Freq: Every day | ORAL | Status: DC

## 2015-09-11 ENCOUNTER — Other Ambulatory Visit (INDEPENDENT_AMBULATORY_CARE_PROVIDER_SITE_OTHER): Payer: Medicare Other | Admitting: *Deleted

## 2015-09-11 DIAGNOSIS — E876 Hypokalemia: Secondary | ICD-10-CM

## 2015-09-11 LAB — BASIC METABOLIC PANEL
BUN: 18 mg/dL (ref 7–25)
CO2: 28 mmol/L (ref 20–31)
Calcium: 8.6 mg/dL (ref 8.6–10.4)
Chloride: 106 mmol/L (ref 98–110)
Creat: 0.95 mg/dL — ABNORMAL HIGH (ref 0.60–0.93)
Glucose, Bld: 78 mg/dL (ref 65–99)
Potassium: 3.9 mmol/L (ref 3.5–5.3)
Sodium: 143 mmol/L (ref 135–146)

## 2015-12-13 DIAGNOSIS — H16233 Neurotrophic keratoconjunctivitis, bilateral: Secondary | ICD-10-CM | POA: Diagnosis not present

## 2015-12-13 DIAGNOSIS — H1852 Epithelial (juvenile) corneal dystrophy: Secondary | ICD-10-CM | POA: Diagnosis not present

## 2015-12-13 DIAGNOSIS — H40053 Ocular hypertension, bilateral: Secondary | ICD-10-CM | POA: Diagnosis not present

## 2015-12-26 ENCOUNTER — Telehealth: Payer: Self-pay

## 2015-12-26 NOTE — Telephone Encounter (Signed)
I spoke to patient and rescheduled her appt to 9/28 due to provider being out of office.

## 2016-01-14 ENCOUNTER — Ambulatory Visit: Payer: Medicare Other | Admitting: Adult Health

## 2016-01-16 ENCOUNTER — Encounter: Payer: Self-pay | Admitting: Adult Health

## 2016-01-16 ENCOUNTER — Ambulatory Visit (INDEPENDENT_AMBULATORY_CARE_PROVIDER_SITE_OTHER): Payer: Medicare Other | Admitting: Adult Health

## 2016-01-16 VITALS — BP 151/77 | HR 58 | Ht 63.0 in | Wt 149.2 lb

## 2016-01-16 DIAGNOSIS — G250 Essential tremor: Secondary | ICD-10-CM | POA: Diagnosis not present

## 2016-01-16 DIAGNOSIS — R569 Unspecified convulsions: Secondary | ICD-10-CM

## 2016-01-16 MED ORDER — LEVETIRACETAM 250 MG PO TABS
ORAL_TABLET | ORAL | 11 refills | Status: DC
Start: 2016-01-16 — End: 2016-07-21

## 2016-01-16 NOTE — Progress Notes (Signed)
PATIENT: Robin Anderson DOB: 08-04-1940  REASON FOR VISIT: follow up- seizures, essential tremor HISTORY FROM: patient  HISTORY OF PRESENT ILLNESS: Robin Anderson is a 75 year old female with a history of seizures and essential tremor. She returns today for follow-up. She denies any seizure activity. She states that he recently she did wake up in time that she had bitten her gum. She reports that she is unsure if this was a seizure event. She reports that her husband sleeps very "heavily" so he would not know. She continues on Keppra 250 mg twice a day. She reports that she continues to have a tremor in the hands right greater than left. She is able to complete all ADLs independently. She does not operate a motor vehicle. She lives at home with her spouse. She is able to prepare her own meals without difficulty. Denies any changes with her gait or balance. No change in mood or behavior. Overall she feels that she is doing well and returns today for an evaluation.   HISTORY 07/15/15: Robin Anderson is a 75 year old female with a history of seizure disorder. She returns today for follow-up. She is currently taking Keppra 1 tablet in the morning and 1 tablet in the evening. She states that she is tolerating this medication well. She denies any recent seizure events. She is able to complete all ADLs independently. She does not operate a motor vehicle. She denies any changes with her gait or balance. Denies any changes with her mood or behavior. She states that back in October she was at the kitchen sink cleaning food and her right hand drew up and she had some numbness and tingling. She states that she has carpal tunnel in that hand. She states that she also will have a tremor primarily in the right hand but sometimes it will affect the left hand. Overall she feels that she is doing well. She returns today for an evaluation.  Update 12/31/14 (MM): Robin Anderson is a 74 year old female with a history of seizure disorder  and headaches. She returns today for follow-up. She continues to take Keppra. However she states that she did decrease her dose to 1 tablet in the morning and 1 tablet in the evening. She was on carbamazepine however she states that she discontinued this. She states that she was having nosebleeds and she states that once she stopped the carbamazepine these have resolved. She also states that her headaches resolved when she stopped Carbamazepine. The patient denies any seizure events. She is able to complete all ADLs independently. She does not operate a motor vehicle. Overall she feels that she is doing very well. She returns today for an evaluation.   HISTORY  07/02/14: Robin Anderson is a 75 year old right-handed black female with a history of a seizure disorder. The patient indicates that she had a probable seizure in December 2015, the patient woke up having bitten her cheek. Blood work done a month ago indicated that the carbamazepine level was low at 1.0, the patient generally will run a level between 7 and 10. The patient is now back on her carbamazepine, she denies missing a dose, a level drawn 3 weeks ago was therapeutic once again. The patient was increased on her Keppra dosing. She is tolerating medications fairly well. The patient indicates that beginning around August 2015, she began having daily headaches that are bifrontal in nature, these have continued. She indicates that headaches are unusual for her. She denies any nausea or vomiting with the headaches,  she generally does not take medications with the headache. She denies any visual changes. She fell down a flight of stairs on 06/23/2014, and landed on her back and hit her head. She is now having some upper neck and occipital headaches as well. She has had some visual blurring, she recently saw her optometrist, and had a new prescription for her glasses given to her, but she has not filled the prescription yet.   REVIEW OF SYSTEMS: Out of a  complete 14 system review of symptoms, the patient complains only of the following symptoms, and all other reviewed systems are negative.  ALLERGIES: Allergies  Allergen Reactions  . Penicillins Rash    HOME MEDICATIONS: Outpatient Medications Prior to Visit  Medication Sig Dispense Refill  . aspirin 325 MG tablet Take 325 mg by mouth daily.      . benazepril-hydrochlorthiazide (LOTENSIN HCT) 20-12.5 MG tablet Take 1 tablet by mouth daily. 90 tablet 3  . levETIRAcetam (KEPPRA) 250 MG tablet Take 1 tablet (250 mg total) by mouth 2 (two) times daily. 60 tablet 11  . Multiple Vitamins-Minerals (ICAPS MV PO) Take by mouth daily.    Vladimir Faster Glycol-Propyl Glycol (SYSTANE) 0.4-0.3 % SOLN Apply to eye.    . potassium chloride SA (K-DUR,KLOR-CON) 20 MEQ tablet Take 1 tablet (20 mEq total) by mouth daily. 30 tablet 9  . simvastatin (ZOCOR) 20 MG tablet TAKE ONE TABLET BY MOUTH ONCE DAILY AT BEDTIME AS DIRECTED 90 tablet 3   No facility-administered medications prior to visit.     PAST MEDICAL HISTORY: Past Medical History:  Diagnosis Date  . Blood in stool   . Chest pain, unspecified   . Headache 07/02/2014  . Heart murmur   . Hepatitis   . History of colon polyps   . HLD (hyperlipidemia)   . HTN (hypertension)   . Prediabetes 07/28/2011  . Seizure disorder ([place])     PAST SURGICAL HISTORY: Past Surgical History:  Procedure Laterality Date  . ABDOMINAL HERNIA REPAIR     lower mid line hernia which has reherniatied  . BUNIONECTOMY Right    traumatic injury to her foot that was repaired  . TONSILLECTOMY    . TUBAL LIGATION      FAMILY HISTORY: Family History  Problem Relation Age of Onset  . Hypertension Mother   . Heart attack Father   . Hypertension Sister     5 sisters....3 have HTN  . Hypertension Brother   . Breast cancer Sister   . Rectal cancer Sister   . Heart attack Daughter     SOCIAL HISTORY: Social History   Social History  . Marital status: Married     Spouse name: [name]  . Number of children: 6  . Years of education: 75   Occupational History  . retired    Social History Main Topics  . Smoking status: Former Smoker    Types: Cigarettes  . Smokeless tobacco: Never Used  . Alcohol use No  . Drug use: No  . Sexual activity: Not on file   Other Topics Concern  . Not on file   Social History Narrative   Patient lives at home with her husband Gaylin Hosbach.   Retired.   Education high school.   Right handed..      Regular exercise-yes   Caffeine Use-yes      PHYSICAL EXAM  Vitals:   01/16/16 1316  BP: (!) 151/77  Pulse: (!) 58  Weight: 149 lb 3.2 oz (67.7  kg)  Height: 5\' 3"  (1.6 m)   Body mass index is 26.43 kg/m.  Generalized: Well developed, in no acute distress   Neurological examination  Mentation: Alert oriented to time, place, history taking. Follows all commands speech and language fluent Cranial nerve II-XII: Pupils were equal round reactive to light. Extraocular movements were full, visual field were full on confrontational test. Facial sensation and strength were normal. Uvula tongue midline. Head turning and shoulder shrug  were normal and symmetric. Motor: The motor testing reveals 5 over 5 strength of all 4 extremities. Good symmetric motor tone is noted throughout.  Sensory: Sensory testing is intact to soft touch on all 4 extremities. No evidence of extinction is noted.  Coordination: Cerebellar testing reveals good finger-nose-finger and heel-to-shin bilaterally.  Gait and station: Gait is normal. Tandem gait is normal. Romberg is negative. No drift is seen.  Reflexes: Deep tendon reflexes are symmetric and normal bilaterally.   DIAGNOSTIC DATA (LABS, IMAGING, TESTING) - I reviewed patient records, labs, notes, testing and imaging myself where available.  Lab Results  Component Value Date   WBC 7.1 08/27/2015   HGB 11.0 (L) 08/27/2015   HCT 34.6 (L) 08/27/2015   MCV 83.6 08/27/2015   PLT  199 08/27/2015      Component Value Date/Time   NA 143 09/11/2015 0944   NA 146 (H) 05/17/2014 1233   K 3.9 09/11/2015 0944   CL 106 09/11/2015 0944   CO2 28 09/11/2015 0944   GLUCOSE 78 09/11/2015 0944   BUN 18 09/11/2015 0944   BUN 23 05/17/2014 1233   CREATININE 0.95 (H) 09/11/2015 0944   CALCIUM 8.6 09/11/2015 0944   PROT 6.8 08/27/2015 1402   PROT 6.8 05/17/2014 1233   ALBUMIN 3.8 08/27/2015 1402   ALBUMIN 4.2 05/17/2014 1233   AST 19 08/27/2015 1402   ALT 14 08/27/2015 1402   ALKPHOS 89 08/27/2015 1402   BILITOT 0.4 08/27/2015 1402   GFRNONAA 60 05/17/2014 1233   GFRAA 69 05/17/2014 1233   Lab Results  Component Value Date   CHOL 140 08/27/2015   HDL 64 08/27/2015   LDLCALC 66 08/27/2015   TRIG 51 08/27/2015   CHOLHDL 2.2 08/27/2015        ASSESSMENT AND PLAN 75 y.o. year old female  has a past medical history of Blood in stool; Chest pain, unspecified; Headache (07/02/2014); Heart murmur; Hepatitis; History of colon polyps; HLD (hyperlipidemia); HTN (hypertension); Prediabetes (07/28/2011); and Seizure disorder ([place]). here with:  1. Seizure disorder 2. Essential tremor  The patient is doing well. She did wake up and found that she had bitten her gum- unsure if this represents a seizure. She will increase Keppra to 250 mg in the morning and 500 mg at bedtime. The patient has a mild tremor affecting primarily the right hand but occasionally the left. We will continue to monitor. Patient advised that if her symptoms worsen or she develop any new symptoms she should let us know. Will follow-up in 6 months with Dr. Jannifer Franklin.    Ward Givens, MSN, NP-C 01/16/2016, 1:27 PM [facility] [address] [place], [place] [address] [phone]

## 2016-01-16 NOTE — Patient Instructions (Addendum)
Increase  Keppra  To 250 mg in the morning and 500 mg at bedtime If you have any seizure events let us know If your symptoms worsen or you develop new symptoms please let us know.

## 2016-01-16 NOTE — Progress Notes (Signed)
I have read the note, and I agree with the clinical assessment and plan.  WILLIS,CHARLES KEITH   

## 2016-01-17 DIAGNOSIS — H40053 Ocular hypertension, bilateral: Secondary | ICD-10-CM | POA: Diagnosis not present

## 2016-01-17 DIAGNOSIS — H16233 Neurotrophic keratoconjunctivitis, bilateral: Secondary | ICD-10-CM | POA: Diagnosis not present

## 2016-01-17 DIAGNOSIS — H1852 Epithelial (juvenile) corneal dystrophy: Secondary | ICD-10-CM | POA: Diagnosis not present

## 2016-05-21 ENCOUNTER — Other Ambulatory Visit: Payer: Self-pay

## 2016-05-21 MED ORDER — BENAZEPRIL-HYDROCHLOROTHIAZIDE 20-12.5 MG PO TABS: 1.0000 | ORAL_TABLET | Freq: Every day | ORAL | 0 refills | Status: DC

## 2016-05-22 ENCOUNTER — Other Ambulatory Visit: Payer: Self-pay

## 2016-05-22 MED ORDER — BENAZEPRIL-HYDROCHLOROTHIAZIDE 20-12.5 MG PO TABS: 1.0000 | ORAL_TABLET | Freq: Every day | ORAL | 0 refills | Status: DC

## 2016-06-23 DIAGNOSIS — H40053 Ocular hypertension, bilateral: Secondary | ICD-10-CM | POA: Diagnosis not present

## 2016-06-23 DIAGNOSIS — H2513 Age-related nuclear cataract, bilateral: Secondary | ICD-10-CM | POA: Diagnosis not present

## 2016-07-21 ENCOUNTER — Ambulatory Visit (INDEPENDENT_AMBULATORY_CARE_PROVIDER_SITE_OTHER): Payer: Medicare Other | Admitting: Neurology

## 2016-07-21 ENCOUNTER — Encounter: Payer: Self-pay | Admitting: Neurology

## 2016-07-21 VITALS — BP 176/79 | HR 54 | Ht 63.0 in | Wt 151.4 lb

## 2016-07-21 DIAGNOSIS — G40409 Other generalized epilepsy and epileptic syndromes, not intractable, without status epilepticus: Secondary | ICD-10-CM

## 2016-07-21 MED ORDER — LEVETIRACETAM 500 MG PO TABS: 500.0000 mg | ORAL_TABLET | Freq: Two times a day (BID) | ORAL | 3 refills | Status: DC

## 2016-07-21 NOTE — Patient Instructions (Signed)
   We will increase the Keppra to 500 mg twice a day.

## 2016-07-21 NOTE — Progress Notes (Signed)
Reason for visit: Seizures  Robin Anderson is an 76 y.o. female  History of present illness:  Robin Anderson is a 76 year old right-handed black female with a history of seizure events. The patient has had an event 2 or 3 weeks ago where she woke up having bitten her lip. The patient was not sore with the muscles, no headache was noted. She is not sure if anything happened during the night, her husband never wakes up, he sleeps too soundly. The patient is on Keppra taking 250 mg in the morning and 500 mg in the evening. She is tolerating the drug well, she denies any irritability or drowsiness on the medication. She does not operate a motor vehicle. She returns for an evaluation.  Past Medical History:  Diagnosis Date  . Blood in stool   . Chest pain, unspecified   . Headache 07/02/2014  . Heart murmur   . Hepatitis   . History of colon polyps   . HLD (hyperlipidemia)   . HTN (hypertension)   . Prediabetes 07/28/2011  . Seizure disorder [facility])     Past Surgical History:  Procedure Laterality Date  . ABDOMINAL HERNIA REPAIR     lower mid line hernia which has reherniatied  . BUNIONECTOMY Right    traumatic injury to her foot that was repaired  . TONSILLECTOMY    . TUBAL LIGATION      Family History  Problem Relation Age of Onset  . Hypertension Mother   . Heart attack Father   . Hypertension Sister     5 sisters....3 have HTN  . Hypertension Brother   . Breast cancer Sister   . Rectal cancer Sister   . Heart attack Daughter     Social history:  reports that she has quit smoking. Her smoking use included Cigarettes. She has never used smokeless tobacco. She reports that she does not drink alcohol or use drugs.    Allergies  Allergen Reactions  . Penicillins Rash    Medications:  Prior to Admission medications   Medication Sig Start Date End Date Taking? Authorizing Provider  aspirin 325 MG tablet Take 325 mg by mouth daily.     Yes Historical Provider, MD    benazepril-hydrochlorthiazide (LOTENSIN HCT) 20-12.5 MG tablet Take 1 tablet by mouth daily. 05/22/16  Yes Lelon Perla, MD  latanoprost (XALATAN) 0.005 % ophthalmic solution at bedtime. Both eyes 12/13/15  Yes Historical Provider, MD  levETIRAcetam (KEPPRA) 250 MG tablet Take 1 tablet PO in the morning and 2 tablets at bedtime 01/16/16  Yes Ward Givens, NP  Multiple Vitamins-Minerals (ICAPS MV PO) Take by mouth daily.   Yes Historical Provider, MD  Polyethyl Glycol-Propyl Glycol (SYSTANE) 0.4-0.3 % SOLN Apply to eye.   Yes Historical Provider, MD  simvastatin (ZOCOR) 20 MG tablet TAKE ONE TABLET BY MOUTH ONCE DAILY AT BEDTIME AS DIRECTED 08/27/15  Yes Burtis Junes, NP    ROS:  Out of a complete 14 system review of symptoms, the patient complains only of the following symptoms, and all other reviewed systems are negative.  Cough  Blood pressure (!) 176/79, pulse (!) 54, height 5\' 3"  (1.6 m), weight 151 lb 6.4 oz (68.7 kg).  Physical Exam  General: The patient is alert and cooperative at the time of the examination.  Skin: No significant peripheral edema is noted with exception of 1+ edema at the right ankle, not present on the left.   Neurologic Exam  Mental status: The patient is  alert and oriented x 3 at the time of the examination. The patient has apparent normal recent and remote memory, with an apparently normal attention span and concentration ability.   Cranial nerves: Facial symmetry is present. Speech is normal, no aphasia or dysarthria is noted. Extraocular movements are full. Visual fields are full.  Motor: The patient has good strength in all 4 extremities.  Sensory examination: Soft touch sensation is symmetric on the face, arms, and legs.  Coordination: The patient has good finger-nose-finger and heel-to-shin bilaterally.  Gait and station: The patient has a normal gait. Tandem gait is normal. Romberg is negative. No drift is seen.  Reflexes: Deep tendon  reflexes are symmetric.   Assessment/Plan:  1. History of seizures  It is not clear whether the episode of biting the lip was an actual seizure, the patient is on low-dose Keppra, she is tolerating the drug, I will go up on the Keppra taking 500 mg twice daily. She will follow-up in one year, sooner if needed.  Jill Alexanders MD 07/21/2016 10:39 AM  [facility] [address] [place], [place] [address]  Phone [phone] Fax [phone]

## 2016-08-06 ENCOUNTER — Encounter: Payer: Self-pay | Admitting: Nurse Practitioner

## 2016-08-08 ENCOUNTER — Other Ambulatory Visit: Payer: Self-pay | Admitting: Adult Health

## 2016-08-25 NOTE — Progress Notes (Signed)
CARDIOLOGY OFFICE NOTE  Date:  08/26/2016    Robin Anderson Date of Birth: June 21, 1940 Medical Record #[id number]  PCP:  Robin Anderson  Cardiologist:  Robin Anderson & [facility]    Chief Complaint  Patient presents with  . Hypertension  . Hyperlipidemia    1 year check.     History of Present Illness: Robin Anderson is a 76 y.o. female who presents today for a follow up visit - this is a one year check. She is a former patient of Robin Anderson. Was to establish with Robin Anderson.  She has had atypical chest pain, HTN, HLD and glucose intolerance. Other issues as noted below and include seizure disorder. Remote normal Myoview from 2004 noted.   Has seen me over the past several years - had stopped her Tegretol and I advised her to alert neurology. At last visit was doing ok. BP typically better at home than here.   Comes back today. Here alone. Has been out of her Lotensin HCT for almost a month - says it was denied by [place] - unclear to me as to why. Now with some ankle swelling. BP now high. Her apartment complex is being remodeled - this has stressful. Some night sweats on occasion but no fever. No chest pain.   Past Medical History:  Diagnosis Date  . Blood in stool   . Chest pain, unspecified   . Headache 07/02/2014  . Heart murmur   . Hepatitis   . History of colon polyps   . HLD (hyperlipidemia)   . HTN (hypertension)   . Prediabetes 07/28/2011  . Seizure disorder [facility])     Past Surgical History:  Procedure Laterality Date  . ABDOMINAL HERNIA REPAIR     lower mid line hernia which has reherniatied  . BUNIONECTOMY Right    traumatic injury to her foot that was repaired  . TONSILLECTOMY    . TUBAL LIGATION       Medications: Current Outpatient Prescriptions  Medication Sig Dispense Refill  . benazepril-hydrochlorthiazide (LOTENSIN HCT) 20-12.5 MG tablet Take 1 tablet by mouth daily. 90 tablet 3  . latanoprost (XALATAN) 0.005 % ophthalmic solution at bedtime. Both  eyes    . levETIRAcetam (KEPPRA) 500 MG tablet Take 1 tablet (500 mg total) by mouth 2 (two) times daily. 180 tablet 3  . Multiple Vitamins-Minerals (ICAPS MV PO) Take by mouth daily.    . simvastatin (ZOCOR) 20 MG tablet TAKE ONE TABLET BY MOUTH ONCE DAILY AT BEDTIME AS DIRECTED 90 tablet 3  . aspirin EC 81 MG tablet Take 1 tablet (81 mg total) by mouth daily.     No current facility-administered medications for this visit.     Allergies: Allergies  Allergen Reactions  . Penicillins Rash    Social History: The patient  reports that she has quit smoking. Her smoking use included Cigarettes. She has never used smokeless tobacco. She reports that she does not drink alcohol or use drugs.   Family History: The patient's family history includes Breast cancer in her sister; Heart attack in her daughter and father; Hypertension in her brother, mother, and sister; Rectal cancer in her sister.   Review of Systems: Please see the history of present illness.   Otherwise, the review of systems is positive for none.   All other systems are reviewed and negative.   Physical Exam: VS:  BP (!) 190/100 (BP Location: Left Arm, Patient Position: Sitting, Cuff Size: Normal)   Pulse Marland Kitchen)  54   Ht 5\' 4"  (1.626 m)   Wt 150 lb (68 kg)   BMI 25.75 kg/m  .  BMI Body mass index is 25.75 kg/m.  Wt Readings from Last 3 Encounters:  08/26/16 150 lb (68 kg)  07/21/16 151 lb 6.4 oz (68.7 kg)  01/16/16 149 lb 3.2 oz (67.7 kg)   BP is 170/100 by me.  General: Pleasant. Well developed, well nourished and in no acute distress.   HEENT: Normal.  Neck: Supple, no JVD, carotid bruits, or masses noted.  Cardiac: Regular rate and rhythm. Rate is slow. No murmurs, rubs, or gallops. No edema other than some mild right ankle swelling.  Respiratory:  Lungs are clear to auscultation bilaterally with normal work of breathing.  GI: Soft and nontender.  MS: No deformity or atrophy. Gait and ROM intact.  Skin: Warm and  dry. Color is normal.  Neuro:  Strength and sensation are intact and no gross focal deficits noted.  Psych: Alert, appropriate and with normal affect.   LABORATORY DATA:  EKG:  EKG is ordered today. This demonstrates sinus bradycardia.  Lab Results  Component Value Date   WBC 7.1 08/27/2015   HGB 11.0 (L) 08/27/2015   HCT 34.6 (L) 08/27/2015   PLT 199 08/27/2015   GLUCOSE 78 09/11/2015   CHOL 140 08/27/2015   TRIG 51 08/27/2015   HDL 64 08/27/2015   LDLCALC 66 08/27/2015   ALT 14 08/27/2015   AST 19 08/27/2015   NA 143 09/11/2015   K 3.9 09/11/2015   CL 106 09/11/2015   CREATININE 0.95 (H) 09/11/2015   BUN 18 09/11/2015   CO2 28 09/11/2015   TSH 1.620 08/03/2013   HGBA1C 5.8 08/10/2012    BNP (last 3 results) No results for input(s): BNP in the last 8760 hours.  ProBNP (last 3 results) No results for input(s): PROBNP in the last 8760 hours.   Other Studies Reviewed Today:  MRI IMPRESSION of brain 06/2014:  Unremarkable MRI brain (without). Mild diffuse atrophy. No acute findings.  Assessment/Plan: 1. HTN - not clear to me why she could not get her medicines refilled. I have sent in her refill today. Needs to restart. Have asked her to continue to monitor at home and call us with an update in a couple of weeks to see if additional changes need to be made.   2. HLD - rechecking labs today  3. Seizure disorder - on Keppra  4. Right arm tremor - followed by neurology - not discussed today  5. Asymptomatic bradycardia - remains asymptomatic - not on any rate slowing medicines.   Current medicines are reviewed with the patient today.  The patient does not have concerns regarding medicines other than what has been noted above.  The following changes have been made:  See above.  Labs/ tests ordered today include:    Orders Placed This Encounter  Procedures  . Basic metabolic panel  . CBC  . Hepatic function panel  . Lipid panel  . EKG 12-Lead      Disposition:   FU with me in 1 year.   Patient is agreeable to this plan and will call if any problems develop in the interim.   SignedTruitt Merle, NP  08/26/2016 11:15 AM  [place] [address] [place], [place]  [address] Phone: [phone] Fax: [phone]

## 2016-08-26 ENCOUNTER — Ambulatory Visit (INDEPENDENT_AMBULATORY_CARE_PROVIDER_SITE_OTHER): Payer: Medicare Other | Admitting: Nurse Practitioner

## 2016-08-26 ENCOUNTER — Encounter: Payer: Self-pay | Admitting: Nurse Practitioner

## 2016-08-26 VITALS — BP 190/100 | HR 54 | Ht 64.0 in | Wt 150.0 lb

## 2016-08-26 DIAGNOSIS — E7800 Pure hypercholesterolemia, unspecified: Secondary | ICD-10-CM

## 2016-08-26 LAB — LIPID PANEL
Chol/HDL Ratio: 2.2 ratio (ref 0.0–4.4)
Cholesterol, Total: 138 mg/dL (ref 100–199)
HDL: 63 mg/dL (ref >39)
LDL Calculated: 65 mg/dL (ref 0–99)
Triglycerides: 51 mg/dL (ref 0–149)
VLDL Cholesterol Cal: 10 mg/dL (ref 5–40)

## 2016-08-26 LAB — CBC
Hematocrit: 36 % (ref 34.0–46.6)
Hemoglobin: 11.5 g/dL (ref 11.1–15.9)
MCH: 26.5 pg — ABNORMAL LOW (ref 26.6–33.0)
MCHC: 31.9 g/dL (ref 31.5–35.7)
MCV: 83 fL (ref 79–97)
Platelets: 182 10*3/uL (ref 150–379)
RBC: 4.34 x10E6/uL (ref 3.77–5.28)
RDW: 13.6 % (ref 12.3–15.4)
WBC: 6.3 10*3/uL (ref 3.4–10.8)

## 2016-08-26 LAB — BASIC METABOLIC PANEL
BUN/Creatinine Ratio: 26 (ref 12–28)
BUN: 21 mg/dL (ref 8–27)
CO2: 24 mmol/L (ref 18–29)
Calcium: 9 mg/dL (ref 8.7–10.3)
Chloride: 104 mmol/L (ref 96–106)
Creatinine, Ser: 0.82 mg/dL (ref 0.57–1.00)
GFR calc Af Amer: 82 mL/min/{1.73_m2} (ref >59)
GFR calc non Af Amer: 71 mL/min/{1.73_m2} (ref >59)
Glucose: 103 mg/dL — ABNORMAL HIGH (ref 65–99)
Potassium: 4 mmol/L (ref 3.5–5.2)
Sodium: 145 mmol/L — ABNORMAL HIGH (ref 134–144)

## 2016-08-26 LAB — HEPATIC FUNCTION PANEL
ALT: 20 IU/L (ref 0–32)
AST: 25 IU/L (ref 0–40)
Albumin: 4 g/dL (ref 3.5–4.8)
Alkaline Phosphatase: 93 IU/L (ref 39–117)
Bilirubin Total: 0.4 mg/dL (ref 0.0–1.2)
Bilirubin, Direct: 0.13 mg/dL (ref 0.00–0.40)
Total Protein: 6.6 g/dL (ref 6.0–8.5)

## 2016-08-26 MED ORDER — ASPIRIN EC 81 MG PO TBEC: 81.0000 mg | DELAYED_RELEASE_TABLET | Freq: Every day | ORAL | Status: DC

## 2016-08-26 MED ORDER — BENAZEPRIL-HYDROCHLOROTHIAZIDE 20-12.5 MG PO TABS: 1.0000 | ORAL_TABLET | Freq: Every day | ORAL | 3 refills | Status: DC

## 2016-08-26 NOTE — Patient Instructions (Addendum)
We will be checking the following labs today - BMET, CBC, HPF and lipids   Medication Instructions:    Continue with your current medicines. BUT  Cut your dose of aspirin back to just 81 mg a day (baby aspirin)  I sent in your refill - go pick up and get back on today.     Testing/Procedures To Be Arranged:  N/A  Follow-Up:   See me in one year.     Other Special Instructions:   Monitor your blood pressure over the next few weeks - call us with an update in the next few weeks to make sure you do not need additional medicines added.     If you need a refill on your cardiac medications before your next appointment, please call your pharmacy.   Call the [place] office at [phone] if you have any questions, problems or concerns.

## 2016-10-23 ENCOUNTER — Other Ambulatory Visit: Payer: Self-pay | Admitting: Nurse Practitioner

## 2016-10-26 ENCOUNTER — Other Ambulatory Visit: Payer: Self-pay | Admitting: Nurse Practitioner

## 2016-10-26 NOTE — Telephone Encounter (Signed)
Medication Detail    Disp Refills Start End   simvastatin (ZOCOR) 20 MG tablet 90 tablet 2 10/26/2016    Sig: TAKE 1 TABLET AT BEDTIME   E-Prescribing Status: Receipt confirmed by pharmacy (10/26/2016 7:36 AM EDT)   Pharmacy   [id number] [place] [place]

## 2016-12-24 DIAGNOSIS — H2513 Age-related nuclear cataract, bilateral: Secondary | ICD-10-CM | POA: Diagnosis not present

## 2016-12-24 DIAGNOSIS — H4323 Crystalline deposits in vitreous body, bilateral: Secondary | ICD-10-CM | POA: Diagnosis not present

## 2016-12-24 DIAGNOSIS — H40053 Ocular hypertension, bilateral: Secondary | ICD-10-CM | POA: Diagnosis not present

## 2017-06-23 DIAGNOSIS — H2513 Age-related nuclear cataract, bilateral: Secondary | ICD-10-CM | POA: Diagnosis not present

## 2017-06-23 DIAGNOSIS — H40053 Ocular hypertension, bilateral: Secondary | ICD-10-CM | POA: Diagnosis not present

## 2017-07-21 ENCOUNTER — Ambulatory Visit (INDEPENDENT_AMBULATORY_CARE_PROVIDER_SITE_OTHER): Payer: Medicare Other | Admitting: Adult Health

## 2017-07-21 ENCOUNTER — Encounter: Payer: Self-pay | Admitting: Adult Health

## 2017-07-21 VITALS — BP 178/91 | HR 61 | Ht 64.0 in | Wt 145.4 lb

## 2017-07-21 DIAGNOSIS — G250 Essential tremor: Secondary | ICD-10-CM

## 2017-07-21 DIAGNOSIS — R569 Unspecified convulsions: Secondary | ICD-10-CM | POA: Diagnosis not present

## 2017-07-21 MED ORDER — LEVETIRACETAM 500 MG PO TABS: 500.0000 mg | ORAL_TABLET | Freq: Two times a day (BID) | ORAL | 3 refills | Status: DC

## 2017-07-21 NOTE — Progress Notes (Signed)
PATIENT: Robin Anderson DOB: [birth year]  REASON FOR VISIT: follow up HISTORY FROM: patient  HISTORY OF PRESENT ILLNESS: Today 07/21/17 Robin Anderson is a 77 year old female with a history of seizure events.  She returns today for follow-up.  At the last visit Keppra was increased to 500 mg twice a day.  She is tolerating this well.  Denies any seizure events.  She does note that she has a tremor in the right hand.  She states that this only occurs when she is using that hand.  Denies starting any new medication.  She states that this started over a year ago.  She denies any changes with her mood or behavior.  She does not operate a motor vehicle.  She is able to complete all ADLs independently.  Patient's blood pressure is elevated today.  She reports that she is no longer taking her blood pressure medication.  Reports that she stopped it in January because the cost had increased.  She has not made her cardiologist or PCP aware.  She returns today for evaluation.   HISTORY 07/21/16: Robin Anderson is a 77 year old right-handed black female with a history of seizure events. The patient has had an event 2 or 3 weeks ago where she woke up having bitten her lip. The patient was not sore with the muscles, no headache was noted. She is not sure if anything happened during the night, her husband never wakes up, he sleeps too soundly. The patient is on Keppra taking 250 mg in the morning and 500 mg in the evening. She is tolerating the drug well, she denies any irritability or drowsiness on the medication. She does not operate a motor vehicle. She returns for an evaluation.    REVIEW OF SYSTEMS: Out of a complete 14 system review of symptoms, the patient complains only of the following symptoms, and all other reviewed systems are negative.  Numbness, nervous  ALLERGIES: Allergies  Allergen Reactions  . Penicillins Rash    HOME MEDICATIONS: Outpatient Medications Prior to Visit  Medication Sig Dispense  Refill  . aspirin EC 81 MG tablet Take 1 tablet (81 mg total) by mouth daily.    Marland Kitchen latanoprost (XALATAN) 0.005 % ophthalmic solution at bedtime. Both eyes    . levETIRAcetam (KEPPRA) 500 MG tablet Take 1 tablet (500 mg total) by mouth 2 (two) times daily. 180 tablet 3  . Multiple Vitamins-Minerals (ICAPS MV PO) Take by mouth daily.    . simvastatin (ZOCOR) 20 MG tablet TAKE 1 TABLET AT BEDTIME 90 tablet 2  . benazepril-hydrochlorthiazide (LOTENSIN HCT) 20-12.5 MG tablet Take 1 tablet by mouth daily. (Patient not taking: Reported on 07/21/2017) 90 tablet 3   No facility-administered medications prior to visit.     PAST MEDICAL HISTORY: Past Medical History:  Diagnosis Date  . Blood in stool   . Chest pain, unspecified   . Headache 07/02/2014  . Heart murmur   . Hepatitis   . History of colon polyps   . HLD (hyperlipidemia)   . HTN (hypertension)   . Prediabetes 07/28/2011  . Seizure disorder ([place])     PAST SURGICAL HISTORY: Past Surgical History:  Procedure Laterality Date  . ABDOMINAL HERNIA REPAIR     lower mid line hernia which has reherniatied  . BUNIONECTOMY Right    traumatic injury to her foot that was repaired  . TONSILLECTOMY    . TUBAL LIGATION      FAMILY HISTORY: Family History  Problem Relation Age of  Onset  . Hypertension Mother   . Heart attack Father   . Hypertension Sister        5 sisters....3 have HTN  . Hypertension Brother   . Breast cancer Sister   . Rectal cancer Sister   . Heart attack Daughter     SOCIAL HISTORY: Social History   Socioeconomic History  . Marital status: Married    Spouse name: [name]  . Number of children: 6  . Years of education: 42  . Highest education level: Not on file  Occupational History  . Occupation: retired  Scientific laboratory technician  . Financial resource strain: Not on file  . Food insecurity:    Worry: Not on file    Inability: Not on file  . Transportation needs:    Medical: Not on file    Non-medical: Not on  file  Tobacco Use  . Smoking status: Former Smoker    Types: Cigarettes  . Smokeless tobacco: Never Used  Substance and Sexual Activity  . Alcohol use: No    Alcohol/week: 0.0 oz  . Drug use: No  . Sexual activity: Not on file  Lifestyle  . Physical activity:    Days per week: Not on file    Minutes per session: Not on file  . Stress: Not on file  Relationships  . Social connections:    Talks on phone: Not on file    Gets together: Not on file    Attends religious service: Not on file    Active member of club or organization: Not on file    Attends meetings of clubs or organizations: Not on file    Relationship status: Not on file  . Intimate partner violence:    Fear of current or ex partner: Not on file    Emotionally abused: Not on file    Physically abused: Not on file    Forced sexual activity: Not on file  Other Topics Concern  . Not on file  Social History Narrative   Patient lives at home with her husband Raeley Gilmore.   Retired.   Education high school.   Right handed..      Regular exercise-yes   Caffeine Use-yes      PHYSICAL EXAM  Vitals:   07/21/17 0859  BP: (!) 178/91  Pulse: 61  Weight: 145 lb 6.4 oz (66 kg)  Height: 5\' 4"  (1.626 m)   Body mass index is 24.96 kg/m.  Generalized: Well developed, in no acute distress   Neurological examination  Mentation: Alert oriented to time, place, history taking. Follows all commands speech and language fluent Cranial nerve II-XII: Pupils were equal round reactive to light. Extraocular movements were full, visual field were full on confrontational test. Facial sensation and strength were normal. Uvula tongue midline. Head turning and shoulder shrug  were normal and symmetric. Motor: The motor testing reveals 5 over 5 strength of all 4 extremities. Good symmetric motor tone is noted throughout.  Sensory: Sensory testing is intact to soft touch on all 4 extremities. No evidence of extinction is noted.    Coordination: Cerebellar testing reveals good finger-nose-finger and heel-to-shin bilaterally.  Action tremor noted in hands right greater than left Gait and station: Gait is normal. Tandem gait is normal. Romberg is negative. No drift is seen.  Reflexes: Deep tendon reflexes are symmetric and normal bilaterally.   DIAGNOSTIC DATA (LABS, IMAGING, TESTING) - I reviewed patient records, labs, notes, testing and imaging myself where available.  Lab Results  Component Value Date   WBC 6.3 08/26/2016   HGB 11.5 08/26/2016   HCT 36.0 08/26/2016   MCV 83 08/26/2016   PLT 182 08/26/2016      Component Value Date/Time   NA 145 (H) 08/26/2016 1119   K 4.0 08/26/2016 1119   CL 104 08/26/2016 1119   CO2 24 08/26/2016 1119   GLUCOSE 103 (H) 08/26/2016 1119   GLUCOSE 78 09/11/2015 0944   BUN 21 08/26/2016 1119   CREATININE 0.82 08/26/2016 1119   CREATININE 0.95 (H) 09/11/2015 0944   CALCIUM 9.0 08/26/2016 1119   PROT 6.6 08/26/2016 1119   ALBUMIN 4.0 08/26/2016 1119   AST 25 08/26/2016 1119   ALT 20 08/26/2016 1119   ALKPHOS 93 08/26/2016 1119   BILITOT 0.4 08/26/2016 1119   GFRNONAA 71 08/26/2016 1119   GFRAA 82 08/26/2016 1119   Lab Results  Component Value Date   CHOL 138 08/26/2016   HDL 63 08/26/2016   LDLCALC 65 08/26/2016   TRIG 51 08/26/2016   CHOLHDL 2.2 08/26/2016     ASSESSMENT AND PLAN 77 y.o. year old female  has a past medical history of Blood in stool, Chest pain, unspecified, Headache (07/02/2014), Heart murmur, Hepatitis, History of colon polyps, HLD (hyperlipidemia), HTN (hypertension), Prediabetes (07/28/2011), and Seizure disorder ([place]). here with :  1.  Seizures 2.  Essential tremor  The patient will continue on Keppra 500 mg twice a day.  She will continue to monitor the tremor.  If it worsens she will let us know.  We will not initiate any new medication at this time.  She will follow-up in 1 year or sooner if needed.  Ward Givens, MSN, NP-C 07/21/2017,  9:25 AM [facility] [address] [place], [place] [address] [phone]

## 2017-07-21 NOTE — Patient Instructions (Addendum)
Your Plan:  Continue Keppra Continue to monitor tremor if worsens let us know Call PCP to discuss Blood Pressure medication If your symptoms worsen or you develop new symptoms please let us know.   Thank you for coming to see [place] at [facility]. I hope we have been able to provide you high quality care today.  You may receive a patient satisfaction survey over the next few weeks. We would appreciate your feedback and comments so that we may continue to improve ourselves and the health of our patients.

## 2017-07-28 ENCOUNTER — Other Ambulatory Visit: Payer: Self-pay | Admitting: Nurse Practitioner

## 2017-08-10 ENCOUNTER — Encounter: Payer: Self-pay | Admitting: Gastroenterology

## 2017-08-31 NOTE — Progress Notes (Signed)
CARDIOLOGY OFFICE NOTE  Date:  09/01/2017    Robin Anderson Date of Birth: 1941/03/19 Medical Record #[id number]  PCP:  Robin Anderson  Cardiologist:  Robin Anderson & Robin Anderson    Chief Complaint  Patient presents with  . Hypertension    1 year check - was to establish with Robin Anderson - former patient of Robin Anderson    History of Present Illness: Robin Anderson is a 77 y.o. female who presents today for a one year check. She is a former patient of Robin Anderson. Was to establish with Robin Anderson. She has seen me over the past several years.   She has had atypical chest pain, HTN, HLD and glucose intolerance. Other issues as noted below and include seizure disorder. Remote normal Myoview from 2004 noted.   Has seen me over the past several years - had stopped her Tegretol at a prior visit and I advised her to alert neurology. Last seen a year ago - was out of her BP medicine - unclear as to why.   Comes back today. Here alone.  She has stopped her medicines - unclear as to when - says the "pharmacy was giving me too many". Sounds like there was some issue with the automatic refill. But now she does not have any. BP back up. She is primarily concerned about her tremor in her hand. Unclear to me if she is taking her Keppra as prescribed. She feels like this has caused her to have "moles" all over her head. No chest pain. Not short of breath. She has been watching her diet. She has lost 7 pounds.   Past Medical History:  Diagnosis Date  . Blood in stool   . Chest pain, unspecified   . Headache 07/02/2014  . Heart murmur   . Hepatitis   . History of colon polyps   . HLD (hyperlipidemia)   . HTN (hypertension)   . Prediabetes 07/28/2011  . Seizure disorder [facility])     Past Surgical History:  Procedure Laterality Date  . ABDOMINAL HERNIA REPAIR     lower mid line hernia which has reherniatied  . BUNIONECTOMY Right    traumatic injury to her foot that was repaired  . TONSILLECTOMY     . TUBAL LIGATION       Medications: Current Meds  Medication Sig  . aspirin EC 81 MG tablet Take 1 tablet (81 mg total) by mouth daily.  Marland Kitchen latanoprost (XALATAN) 0.005 % ophthalmic solution at bedtime. Both eyes  . levETIRAcetam (KEPPRA) 500 MG tablet Take 1 tablet (500 mg total) by mouth 2 (two) times daily. (Patient taking differently: Take 500 mg by mouth daily. )  . Multiple Vitamins-Minerals (ICAPS MV PO) Take by mouth daily.     Allergies: Allergies  Allergen Reactions  . Penicillins Rash    Social History: The patient  reports that she has quit smoking. Her smoking use included cigarettes. She has never used smokeless tobacco. She reports that she does not drink alcohol or use drugs.   Family History: The patient's family history includes Breast cancer in her sister; Heart attack in her daughter and father; Hypertension in her brother, mother, and sister; Rectal cancer in her sister.   Review of Systems: Please see the history of present illness.   Otherwise, the review of systems is positive for none.   All other systems are reviewed and negative.   Physical Exam: VS:  BP (!) 160/80 (BP Location: Left Arm,  Patient Position: Sitting, Cuff Size: Normal)   Pulse 71   Ht 5\' 4"  (1.626 m)   Wt 143 lb 12.8 oz (65.2 kg)   BMI 24.68 kg/m  .  BMI Body mass index is 24.68 kg/m.  Wt Readings from Last 3 Encounters:  09/01/17 143 lb 12.8 oz (65.2 kg)  07/21/17 145 lb 6.4 oz (66 kg)  08/26/16 150 lb (68 kg)    General: Pleasant. Well developed, well nourished and in no acute distress.   HEENT: Normal.  Neck: Supple, no JVD, carotid bruits, or masses noted.  Cardiac: Regular rate and rhythm. No murmurs, rubs, or gallops. No edema.  Respiratory:  Lungs are clear to auscultation bilaterally with normal work of breathing.  GI: Soft and nontender.  MS: No deformity or atrophy. Gait and ROM intact.  Skin: Warm and dry. Color is normal.  Neuro:  Strength and sensation are  intact and no gross focal deficits noted.  Psych: Alert, appropriate and with normal affect.   LABORATORY DATA:  EKG:  EKG is ordered today. This demonstrates NSR.  Lab Results  Component Value Date   WBC 6.3 08/26/2016   HGB 11.5 08/26/2016   HCT 36.0 08/26/2016   PLT 182 08/26/2016   GLUCOSE 103 (H) 08/26/2016   CHOL 138 08/26/2016   TRIG 51 08/26/2016   HDL 63 08/26/2016   LDLCALC 65 08/26/2016   ALT 20 08/26/2016   AST 25 08/26/2016   NA 145 (H) 08/26/2016   K 4.0 08/26/2016   CL 104 08/26/2016   CREATININE 0.82 08/26/2016   BUN 21 08/26/2016   CO2 24 08/26/2016   TSH 1.620 08/03/2013   HGBA1C 5.8 08/10/2012     BNP (last 3 results) No results for input(s): BNP in the last 8760 hours.  ProBNP (last 3 results) No results for input(s): PROBNP in the last 8760 hours.   Other Studies Reviewed Today:  MRI IMPRESSION of brain 06/2014:  Unremarkable MRI brain (without). Mild diffuse atrophy. No acute findings.  Assessment/Plan: 1. HTN - restarting again her BP medicines. Lab today.    2. HLD - rechecking labs today - restarting statin today.   3. Seizure disorder - on Keppra  4. Right arm tremor - followed by neurology - this is her primary concern.   5. Asymptomatic bradycardia - remains asymptomatic - not on any rate slowing medicines. HR today is 71 and ok.    Current medicines are reviewed with the patient today.  The patient does not have concerns regarding medicines other than what has been noted above.  The following changes have been made:  See above.  Labs/ tests ordered today include:    Orders Placed This Encounter  Procedures  . Basic metabolic panel  . CBC  . Hepatic function panel  . Lipid panel  . TSH  . EKG 12-Lead     Disposition:   FU with me in 1 year.   Patient is agreeable to this plan and will call if any problems develop in the interim.   SignedTruitt Merle, NP  09/01/2017 10:51 AM  [facility] [address] [place], [place]  [address] Phone: [phone] Fax: [phone]

## 2017-09-01 ENCOUNTER — Ambulatory Visit (INDEPENDENT_AMBULATORY_CARE_PROVIDER_SITE_OTHER): Payer: Medicare Other | Admitting: Nurse Practitioner

## 2017-09-01 ENCOUNTER — Encounter: Payer: Self-pay | Admitting: Nurse Practitioner

## 2017-09-01 ENCOUNTER — Encounter (INDEPENDENT_AMBULATORY_CARE_PROVIDER_SITE_OTHER): Payer: Self-pay

## 2017-09-01 VITALS — BP 160/80 | HR 71 | Ht 64.0 in | Wt 143.8 lb

## 2017-09-01 DIAGNOSIS — E7800 Pure hypercholesterolemia, unspecified: Secondary | ICD-10-CM

## 2017-09-01 DIAGNOSIS — I10 Essential (primary) hypertension: Secondary | ICD-10-CM

## 2017-09-01 LAB — CBC
Hematocrit: 37.7 % (ref 34.0–46.6)
Hemoglobin: 12.4 g/dL (ref 11.1–15.9)
MCH: 27.3 pg (ref 26.6–33.0)
MCHC: 32.9 g/dL (ref 31.5–35.7)
MCV: 83 fL (ref 79–97)
Platelets: 207 10*3/uL (ref 150–379)
RBC: 4.54 x10E6/uL (ref 3.77–5.28)
RDW: 13.6 % (ref 12.3–15.4)
WBC: 6.9 10*3/uL (ref 3.4–10.8)

## 2017-09-01 LAB — BASIC METABOLIC PANEL
BUN/Creatinine Ratio: 14 (ref 12–28)
BUN: 16 mg/dL (ref 8–27)
CO2: 26 mmol/L (ref 20–29)
Calcium: 9.5 mg/dL (ref 8.7–10.3)
Chloride: 102 mmol/L (ref 96–106)
Creatinine, Ser: 1.16 mg/dL — ABNORMAL HIGH (ref 0.57–1.00)
GFR calc Af Amer: 54 mL/min/{1.73_m2} — ABNORMAL LOW (ref >59)
GFR calc non Af Amer: 46 mL/min/{1.73_m2} — ABNORMAL LOW (ref >59)
Glucose: 116 mg/dL — ABNORMAL HIGH (ref 65–99)
Potassium: 3.8 mmol/L (ref 3.5–5.2)
Sodium: 141 mmol/L (ref 134–144)

## 2017-09-01 LAB — LIPID PANEL
Chol/HDL Ratio: 3.1 ratio (ref 0.0–4.4)
Cholesterol, Total: 217 mg/dL — ABNORMAL HIGH (ref 100–199)
HDL: 69 mg/dL (ref >39)
LDL Calculated: 134 mg/dL — ABNORMAL HIGH (ref 0–99)
Triglycerides: 68 mg/dL (ref 0–149)
VLDL Cholesterol Cal: 14 mg/dL (ref 5–40)

## 2017-09-01 LAB — HEPATIC FUNCTION PANEL
ALT: 14 IU/L (ref 0–32)
AST: 19 IU/L (ref 0–40)
Albumin: 4.4 g/dL (ref 3.5–4.8)
Alkaline Phosphatase: 109 IU/L (ref 39–117)
Bilirubin Total: 0.5 mg/dL (ref 0.0–1.2)
Bilirubin, Direct: 0.14 mg/dL (ref 0.00–0.40)
Total Protein: 6.9 g/dL (ref 6.0–8.5)

## 2017-09-01 LAB — TSH: TSH: 1.88 u[IU]/mL (ref 0.450–4.500)

## 2017-09-01 MED ORDER — BENAZEPRIL-HYDROCHLOROTHIAZIDE 20-12.5 MG PO TABS: 1.0000 | ORAL_TABLET | Freq: Every day | ORAL | 11 refills | Status: DC

## 2017-09-01 MED ORDER — SIMVASTATIN 20 MG PO TABS: ORAL_TABLET | ORAL | 11 refills | Status: DC

## 2017-09-01 NOTE — Patient Instructions (Addendum)
We will be checking the following labs today - BMET, CBC, HPF, Lipids and TSH   Medication Instructions:    I am restarting your blood pressure and cholesterol medicines today - these are at the drug store.      Testing/Procedures To Be Arranged:  N/A  Follow-Up:   See me in one year.     Other Special Instructions:   N/A    If you need a refill on your cardiac medications before your next appointment, please call your pharmacy.   Call the [place] office at [phone] if you have any questions, problems or concerns.

## 2017-09-03 ENCOUNTER — Other Ambulatory Visit: Payer: Self-pay | Admitting: *Deleted

## 2017-09-03 DIAGNOSIS — E785 Hyperlipidemia, unspecified: Secondary | ICD-10-CM

## 2017-09-03 NOTE — Progress Notes (Signed)
.  li

## 2017-10-13 ENCOUNTER — Other Ambulatory Visit: Payer: Medicare Other | Admitting: *Deleted

## 2017-10-13 DIAGNOSIS — E785 Hyperlipidemia, unspecified: Secondary | ICD-10-CM

## 2017-10-13 LAB — LIPID PANEL
Chol/HDL Ratio: 2.4 ratio (ref 0.0–4.4)
Cholesterol, Total: 146 mg/dL (ref 100–199)
HDL: 62 mg/dL (ref >39)
LDL Calculated: 75 mg/dL (ref 0–99)
Triglycerides: 46 mg/dL (ref 0–149)
VLDL Cholesterol Cal: 9 mg/dL (ref 5–40)

## 2017-10-13 LAB — HEPATIC FUNCTION PANEL
ALT: 10 IU/L (ref 0–32)
AST: 16 IU/L (ref 0–40)
Albumin: 4.1 g/dL (ref 3.5–4.8)
Alkaline Phosphatase: 99 IU/L (ref 39–117)
Bilirubin Total: 0.7 mg/dL (ref 0.0–1.2)
Bilirubin, Direct: 0.21 mg/dL (ref 0.00–0.40)
Total Protein: 6.8 g/dL (ref 6.0–8.5)

## 2018-03-01 DIAGNOSIS — H40053 Ocular hypertension, bilateral: Secondary | ICD-10-CM | POA: Diagnosis not present

## 2018-03-01 DIAGNOSIS — H4323 Crystalline deposits in vitreous body, bilateral: Secondary | ICD-10-CM | POA: Diagnosis not present

## 2018-03-01 DIAGNOSIS — H2513 Age-related nuclear cataract, bilateral: Secondary | ICD-10-CM | POA: Diagnosis not present

## 2018-04-18 ENCOUNTER — Telehealth: Payer: Self-pay | Admitting: Adult Health

## 2018-04-18 NOTE — Telephone Encounter (Signed)
Called patient to r/s her April appt with Jinny Blossom due to [facility] maternity leave. On the phone, patient expressed that she has been experiencing more shaking in her hands than usual and was curious about a medication that she and Jinny Blossom had discussed at a previous appointment. I told her that I would pass this information along to [facility] and her nurse.

## 2018-04-18 NOTE — Telephone Encounter (Signed)
She will need a revisit.

## 2018-04-21 NOTE — Telephone Encounter (Signed)
Called patient and advised her the NP stated she will need to be seen to evaluate her shaking of hands. Rescheduled her FU for soonest available and put her on wait list. She verbalized understanding,  appreciation.

## 2018-06-06 ENCOUNTER — Ambulatory Visit (INDEPENDENT_AMBULATORY_CARE_PROVIDER_SITE_OTHER): Payer: Medicare Other | Admitting: Neurology

## 2018-06-06 ENCOUNTER — Encounter

## 2018-06-06 ENCOUNTER — Encounter: Payer: Self-pay | Admitting: Neurology

## 2018-06-06 VITALS — BP 154/79 | HR 50 | Ht 64.0 in | Wt 138.2 lb

## 2018-06-06 DIAGNOSIS — G40409 Other generalized epilepsy and epileptic syndromes, not intractable, without status epilepticus: Secondary | ICD-10-CM

## 2018-06-06 DIAGNOSIS — R251 Tremor, unspecified: Secondary | ICD-10-CM | POA: Insufficient documentation

## 2018-06-06 MED ORDER — PRIMIDONE 50 MG PO TABS: 50.0000 mg | ORAL_TABLET | Freq: Every day | ORAL | 3 refills | Status: DC

## 2018-06-06 NOTE — Progress Notes (Signed)
I have read the note, and I agree with the clinical assessment and plan.  Robin Anderson   

## 2018-06-06 NOTE — Patient Instructions (Addendum)
Start taking primidone daily in the evening for your seizures and tremor. Primidone tablets What is this medicine? PRIMIDONE (PRI mi done) is a barbiturate. This medicine is used to control seizures in certain types of epilepsy. It is not for use in absence (petit mal) seizures. This medicine may be used for other purposes; ask your health care provider or pharmacist if you have questions. COMMON BRAND NAME(S): Mysoline What should I tell my health care provider before I take this medicine? They need to know if you have any of these conditions: -kidney disease -liver disease -porphyria -suicidal thoughts, plans, or attempt; a previous suicide attempt by you or a family member -an unusual or allergic reaction to primidone, phenobarbital, other barbiturates or seizure medications, other medicines, foods, dyes, or preservatives -pregnant or trying to get pregnant -breast-feeding How should I use this medicine? Take this medicine by mouth with a glass of water. Follow the directions on the prescription label. Take your doses at regular intervals. Do not take your medicine more often than directed. Do not stop taking except on the advice of your doctor or health care professional. A special MedGuide will be given to you by the pharmacist with each prescription and refill. Be sure to read this information carefully each time. Contact your pediatrician or health care professional regarding the use of this medication in children. Special care may be needed. While this drug may be prescribed for children for selected conditions, precautions do apply. Overdosage: If you think you have taken too much of this medicine contact a poison control center or emergency room at once. NOTE: This medicine is only for you. Do not share this medicine with others. What if I miss a dose? If you miss a dose, take it as soon as you can. If it is almost time for your next dose, take only that dose. Do not take double or  extra doses. What may interact with this medicine? Do not take this medicine with any of the following medications: -voriconazole This medicine may also interact with the following medications: -cancer-treating medications -cyclosporine -disopyramide -doxycycline -female hormones, including contraceptive or birth control pills -medicines for mental depression, anxiety or other mood problems -medicines for treating HIV infection or AIDS -modafinil -prescription pain medications -quinidine -warfarin This list may not describe all possible interactions. Give your health care provider a list of all the medicines, herbs, non-prescription drugs, or dietary supplements you use. Also tell them if you smoke, drink alcohol, or use illegal drugs. Some items may interact with your medicine. What should I watch for while using this medicine? Visit your doctor or health care professional for regular checks on your progress. It may be 2 to 3 weeks before you see the full effects of this medicine. Do not suddenly stop taking this medicine, you may increase the risk of seizures. Your doctor or health care professional may want to gradually reduce the dose. Wear a medical identification bracelet or chain to say you have epilepsy, and carry a card that lists all your medications. You may get drowsy or dizzy. Do not drive, use machinery, or do anything that needs mental alertness until you know how this medicine affects you. Do not stand or sit up quickly, especially if you are an older patient. This reduces the risk of dizzy or fainting spells. Alcohol may interfere with the effect of this medicine. Avoid alcoholic drinks. Birth control pills may not work properly while you are taking this medicine. Talk to your doctor about  using an extra method of birth control. The use of this medicine may increase the chance of suicidal thoughts or actions. Pay special attention to how you are responding while on this  medicine. Any worsening of mood, or thoughts of suicide or dying should be reported to your health care professional right away. Women who become pregnant while using this medicine may enroll in the [place] Pregnancy Registry by calling [phone]. This registry collects information about the safety of antiepileptic drug use during pregnancy. This medicine may cause a decrease in vitamin D and folic acid. You should make sure that you get enough vitamins while you are taking this medicine. Discuss the foods you eat and the vitamins you take with your health care professional. What side effects may I notice from receiving this medicine? Side effects that you should report to your doctor or health care professional as soon as possible: -allergic reactions like skin rash, itching or hives, swelling of the face, lips, or tongue -blurred, double vision, or uncontrollable rolling or movements of the eyes -redness, blistering, peeling or loosening of the skin, including inside the mouth -shortness of breath or difficulty breathing -unusual excitement or restlessness, more likely in children and the elderly -unusually weak or tired -worsening of mood, thoughts or actions of suicide or dying Side effects that usually do not require medical attention (report to your doctor or health care professional if they continue or are bothersome): -clumsiness, unsteadiness, or a hang-over effect -decreased sexual ability -dizziness, drowsiness -loss of appetite -nausea or vomiting This list may not describe all possible side effects. Call your doctor for medical advice about side effects. You may report side effects to FDA at 1-800-FDA-1088. Where should I keep my medicine? Keep out of the reach of children. This medicine may cause accidental overdose and death if it taken by other adults, children, or pets. Mix any unused medicine with a substance like cat litter or coffee grounds.  Then throw the medicine away in a sealed container like a sealed bag or a coffee can with a lid. Do not use the medicine after the expiration date. Store at room temperature between 15 and 30 degrees C (59 and 86 degrees F). NOTE: This sheet is a summary. It may not cover all possible information. If you have questions about this medicine, talk to your doctor, pharmacist, or health care provider.  2019 Elsevier/Gold Standard (2016-11-18 15:21:47)

## 2018-06-06 NOTE — Progress Notes (Signed)
PATIENT: Robin Anderson DOB: [birth year]  REASON FOR VISIT: follow up HISTORY FROM: patient  HISTORY OF PRESENT ILLNESS: Today 06/06/18  Robin Anderson is a 78 year old female who presents for follow-up today for seizure disorder and tremor.  She reports her last seizure was in September 2017 when she woke up and found that she had bitten her gum.  At that time her Keppra was increased to 500 mg twice a day.  She reports that last October she stopped taking her Keppra because she began to develop moles and dark spots on her face and scalp.  She reports that she has not had any seizure events.  She reports that she does not drive a car.  She continues to complain of a tremor to her right hand.  She reports that the tremor is most bothersome when she is trying to do something or if she is trying to pick something up.  She also reports that her handwriting is greatly affected and she is right-handed.  She reports that the tremor in her right hand continues to get worse.  She reports that she has been occasionally noticed some tremor to the right side of her head. She denies any falls or gait problems.  She presents today for follow-up unaccompanied.     HISTORY 07/21/2017 MM Robin Anderson is a 78 year old female with a history of seizure events.  She returns today for follow-up.  At the last visit Keppra was increased to 500 mg twice a day.  She is tolerating this well.  Denies any seizure events.  She does note that she has a tremor in the right hand.  She states that this only occurs when she is using that hand. Denies starting any new medication.  She states that this started over a year ago.  She denies any changes with her mood or behavior.  She does not operate a motor vehicle.  She is able to complete all ADLs independently.  Patient's blood pressure is elevated today.  She reports that she is no longer taking her blood pressure medication. Reports that she stopped it in January because the cost had  increased.  She has not made her cardiologist or PCP aware.  She returns today for evaluation   REVIEW OF SYSTEMS: Out of a complete 14 system review of symptoms, the patient complains only of the following symptoms, and all other reviewed systems are negative.  Excessive sweating, cough, leg swelling, joint pain, rash, tremors, nervous/anxious  ALLERGIES: Allergies  Allergen Reactions  . Penicillins Rash    HOME MEDICATIONS: Outpatient Medications Prior to Visit  Medication Sig Dispense Refill  . aspirin EC 81 MG tablet Take 1 tablet (81 mg total) by mouth daily.    . benazepril-hydrochlorthiazide (LOTENSIN HCT) 20-12.5 MG tablet Take 1 tablet by mouth daily. 30 tablet 11  . latanoprost (XALATAN) 0.005 % ophthalmic solution at bedtime. Both eyes    . Multiple Vitamins-Minerals (ICAPS MV PO) Take by mouth daily.    . simvastatin (ZOCOR) 20 MG tablet TAKE 1 TABLET BY MOUTH EVERYDAY AT BEDTIME 30 tablet 11  . levETIRAcetam (KEPPRA) 500 MG tablet Take 1 tablet (500 mg total) by mouth 2 (two) times daily. (Patient not taking: Reported on 06/06/2018) 180 tablet 3   No facility-administered medications prior to visit.     PAST MEDICAL HISTORY: Past Medical History:  Diagnosis Date  . Blood in stool   . Chest pain, unspecified   . Headache 07/02/2014  . Heart murmur   .  Hepatitis   . History of colon polyps   . HLD (hyperlipidemia)   . HTN (hypertension)   . Prediabetes 07/28/2011  . Seizure disorder ([place])     PAST SURGICAL HISTORY: Past Surgical History:  Procedure Laterality Date  . ABDOMINAL HERNIA REPAIR     lower mid line hernia which has reherniatied  . BUNIONECTOMY Right    traumatic injury to her foot that was repaired  . TONSILLECTOMY    . TUBAL LIGATION      FAMILY HISTORY: Family History  Problem Relation Age of Onset  . Hypertension Mother   . Heart attack Father   . Hypertension Sister        5 sisters....3 have HTN  . Hypertension Brother   . Breast  cancer Sister   . Rectal cancer Sister   . Heart attack Daughter     SOCIAL HISTORY: Social History   Socioeconomic History  . Marital status: Married    Spouse name: [name]  . Number of children: 6  . Years of education: 41  . Highest education level: Not on file  Occupational History  . Occupation: retired  Scientific laboratory technician  . Financial resource strain: Not on file  . Food insecurity:    Worry: Not on file    Inability: Not on file  . Transportation needs:    Medical: Not on file    Non-medical: Not on file  Tobacco Use  . Smoking status: Former Smoker    Types: Cigarettes  . Smokeless tobacco: Never Used  Substance and Sexual Activity  . Alcohol use: No    Alcohol/week: 0.0 standard drinks  . Drug use: No  . Sexual activity: Not on file  Lifestyle  . Physical activity:    Days per week: Not on file    Minutes per session: Not on file  . Stress: Not on file  Relationships  . Social connections:    Talks on phone: Not on file    Gets together: Not on file    Attends religious service: Not on file    Active member of club or organization: Not on file    Attends meetings of clubs or organizations: Not on file    Relationship status: Not on file  . Intimate partner violence:    Fear of current or ex partner: Not on file    Emotionally abused: Not on file    Physically abused: Not on file    Forced sexual activity: Not on file  Other Topics Concern  . Not on file  Social History Narrative   Patient lives at home with her husband Robin Anderson.   Retired.   Education high school.   Right handed..      Regular exercise-yes   Caffeine Use-yes      PHYSICAL EXAM  Vitals:   06/06/18 1321  BP: (!) 154/79  Pulse: (!) 50  Weight: 138 lb 3.2 oz (62.7 kg)  Height: 5\' 4"  (1.626 m)   Body mass index is 23.72 kg/m.  Generalized: Well developed, in no acute distress   Neurological examination  Mentation: Alert oriented to time, place, history taking.  Follows all commands speech and language fluent Cranial nerve II-XII: Pupils were equal round reactive to light. Extraocular movements were full, visual field were full on confrontational test. Facial sensation and strength were normal. Uvula tongue midline. Head turning and shoulder shrug  were normal and symmetric. Motor: The motor testing reveals 5 over 5 strength of all 4 extremities.  Good symmetric motor tone is noted throughout. Mild action tremor to right hand greater than left.  No impairment of finger/foot taps.  Sensory: Sensory testing is intact to soft touch on all 4 extremities. No evidence of extinction is noted.  Coordination: Cerebellar testing reveals good finger-nose-finger and heel-to-shin bilaterally.  Gait and station: Gait is normal. Tandem gait is normal. Romberg is negative. No drift is seen.  Reflexes: Deep tendon reflexes are symmetric and normal bilaterally.   DIAGNOSTIC DATA (LABS, IMAGING, TESTING) - I reviewed patient records, labs, notes, testing and imaging myself where available.  Lab Results  Component Value Date   WBC 6.9 09/01/2017   HGB 12.4 09/01/2017   HCT 37.7 09/01/2017   MCV 83 09/01/2017   PLT 207 09/01/2017      Component Value Date/Time   NA 141 09/01/2017 1102   K 3.8 09/01/2017 1102   CL 102 09/01/2017 1102   CO2 26 09/01/2017 1102   GLUCOSE 116 (H) 09/01/2017 1102   GLUCOSE 78 09/11/2015 0944   BUN 16 09/01/2017 1102   CREATININE 1.16 (H) 09/01/2017 1102   CREATININE 0.95 (H) 09/11/2015 0944   CALCIUM 9.5 09/01/2017 1102   PROT 6.8 10/13/2017 0954   ALBUMIN 4.1 10/13/2017 0954   AST 16 10/13/2017 0954   ALT 10 10/13/2017 0954   ALKPHOS 99 10/13/2017 0954   BILITOT 0.7 10/13/2017 0954   GFRNONAA 46 (L) 09/01/2017 1102   GFRAA 54 (L) 09/01/2017 1102   Lab Results  Component Value Date   CHOL 146 10/13/2017   HDL 62 10/13/2017   LDLCALC 75 10/13/2017   TRIG 46 10/13/2017   CHOLHDL 2.4 10/13/2017   Lab Results  Component  Value Date   HGBA1C 5.8 08/10/2012   No results found for: VITAMINB12 Lab Results  Component Value Date   TSH 1.880 09/01/2017      ASSESSMENT AND PLAN 78 y.o. year old female  has a past medical history of Blood in stool, Chest pain, unspecified, Headache (07/02/2014), Heart murmur, Hepatitis, History of colon polyps, HLD (hyperlipidemia), HTN (hypertension), Prediabetes (07/28/2011), and Seizure disorder ([place]). here with:  1.  Seizure disorder 2.  Essential tremor  She reports that she stopped taking her Keppra back in September 2019 because she felt like it caused her to develop moles and dark spots on her face and scalp. I advised her that she needs to continue taking seizure medication.  Her last seizure was in 2017.  She has an essential tremor to bilateral hands but the right is greater than the left.  She reports this is gotten worse and is bothersome to her.  She will start primidone today taking 50 mg at bedtime for dual benefit for seizures and tremor.  I advised her to take this for a month and determine if it is beneficial.  We could increase the medication to 100 mg at bedtime in the future.  Propanolol was not considered at this time as her heart rate was noted to be 50.  She does not drive a car.  She will follow-up in 3 months or sooner if needed.  I advised her that if her symptoms worsen or she should develop new symptoms she should let us know.  I will check basic blood work today as we are starting primidone.  At the next visit we should check a primidone level.  Butler Denmark, AGNP-C, DNP 06/06/2018, 3:30 PM [facility] [address], [place],  [address] [phone]

## 2018-06-07 ENCOUNTER — Telehealth: Payer: Self-pay

## 2018-06-07 LAB — CBC WITH DIFFERENTIAL/PLATELET
Basophils Absolute: 0.1 10*3/uL (ref 0.0–0.2)
Basos: 1 %
EOS (ABSOLUTE): 0.2 10*3/uL (ref 0.0–0.4)
Eos: 3 %
Hematocrit: 36.7 % (ref 34.0–46.6)
Hemoglobin: 12.2 g/dL (ref 11.1–15.9)
Immature Grans (Abs): 0 10*3/uL (ref 0.0–0.1)
Immature Granulocytes: 0 %
Lymphocytes Absolute: 1.7 10*3/uL (ref 0.7–3.1)
Lymphs: 23 %
MCH: 27.7 pg (ref 26.6–33.0)
MCHC: 33.2 g/dL (ref 31.5–35.7)
MCV: 83 fL (ref 79–97)
Monocytes Absolute: 0.5 10*3/uL (ref 0.1–0.9)
Monocytes: 7 %
Neutrophils Absolute: 4.7 10*3/uL (ref 1.4–7.0)
Neutrophils: 66 %
Platelets: 167 10*3/uL (ref 150–450)
RBC: 4.41 x10E6/uL (ref 3.77–5.28)
RDW: 12.5 % (ref 11.7–15.4)
WBC: 7.1 10*3/uL (ref 3.4–10.8)

## 2018-06-07 LAB — COMPREHENSIVE METABOLIC PANEL
ALT: 11 IU/L (ref 0–32)
AST: 18 IU/L (ref 0–40)
Albumin/Globulin Ratio: 1.7 (ref 1.2–2.2)
Albumin: 4.6 g/dL (ref 3.7–4.7)
Alkaline Phosphatase: 89 IU/L (ref 39–117)
BUN/Creatinine Ratio: 18 (ref 12–28)
BUN: 18 mg/dL (ref 8–27)
Bilirubin Total: 0.5 mg/dL (ref 0.0–1.2)
CO2: 26 mmol/L (ref 20–29)
Calcium: 9.5 mg/dL (ref 8.7–10.3)
Chloride: 101 mmol/L (ref 96–106)
Creatinine, Ser: 1 mg/dL (ref 0.57–1.00)
GFR calc Af Amer: 64 mL/min/{1.73_m2} (ref >59)
GFR calc non Af Amer: 55 mL/min/{1.73_m2} — ABNORMAL LOW (ref >59)
Globulin, Total: 2.7 g/dL (ref 1.5–4.5)
Glucose: 86 mg/dL (ref 65–99)
Potassium: 3.7 mmol/L (ref 3.5–5.2)
Sodium: 142 mmol/L (ref 134–144)
Total Protein: 7.3 g/dL (ref 6.0–8.5)

## 2018-06-07 NOTE — Telephone Encounter (Signed)
Spoke with the patient and she verbalized understanding her results. No questions or concerns at this time.   

## 2018-06-07 NOTE — Telephone Encounter (Signed)
-----   Message from Suzzanne Cloud, NP sent at 06/07/2018  8:29 AM EST ----- Please call the patient and let her know that her lab work is unremarkable. Thank you.

## 2018-06-27 ENCOUNTER — Encounter: Payer: Self-pay | Admitting: Gastroenterology

## 2018-07-25 ENCOUNTER — Ambulatory Visit: Payer: Medicare Other | Admitting: Neurology

## 2018-07-25 ENCOUNTER — Ambulatory Visit: Payer: Medicare Other | Admitting: Adult Health

## 2018-07-26 ENCOUNTER — Ambulatory Visit: Payer: Medicare Other | Admitting: Gastroenterology

## 2018-08-29 ENCOUNTER — Telehealth: Payer: Self-pay | Admitting: Nurse Practitioner

## 2018-08-29 NOTE — Telephone Encounter (Signed)
New Message   Patient doesn't have smart phone or computer to do virtual visit but states she can do a televisit.  Please call to get consent and setup.

## 2018-08-30 NOTE — Progress Notes (Signed)
Telehealth Visit     Virtual Visit via Video Note   This visit type was conducted due to national recommendations for restrictions regarding the COVID-19 Pandemic (e.g. social distancing) in an effort to limit this patient's exposure and mitigate transmission in our community.  Due to her co-morbid illnesses, this patient is at least at moderate risk for complications without adequate follow up.  This format is felt to be most appropriate for this patient at this time.  All issues noted in this document were discussed and addressed.  A limited physical exam was performed with this format.  Please refer to the patient's chart for her consent to telehealth for [facility].   Evaluation Performed:  Follow-up visit  This visit type was conducted due to national recommendations for restrictions regarding the COVID-19 Pandemic (e.g. social distancing).  This format is felt to be most appropriate for this patient at this time.  All issues noted in this document were discussed and addressed.  No physical exam was performed (except for noted visual exam findings with Video Visits).  Please refer to the patient's chart (MyChart message for video visits and phone note for telephone visits) for the patient's consent to telehealth for [facility].  Date:  08/31/2018   ID:  Michaele Offer, DOB 26-Mar-1941, MRN [id number]  Patient Location:  Home  Provider location:   Home  PCP:  Patient, No Pcp Per  Cardiologist:  Servando Snare   Electrophysiologist:  None   Chief Complaint:  Follow up  History of Present Illness:    Soo Steelman is a 78 y.o. female who presents via audio/video conferencing for a telehealth visit today.  She is a former patient of Dr. Winnifred Friar. Was to establish with Dr. Stanford Breed but never did. She has seen me over the past several years.   She has had atypical chest pain, HTN, HLD and glucose intolerance. Other issues as noted below and include seizure disorder. Remote normal Myoview from  2004 noted.   Has seen me over the past several years - hadstopped her Tegretol at a prior visit and I advised her to alert neurology.She has tended to stop medicines on her own. Last seen a year ago - had stopped all of her medicines at that time - BP was back up - I restarted her medicines back.   The patient does not have symptoms concerning for COVID-19 infection (fever, chills, cough, or new shortness of breath).   Seen today via telephone conversation - she does not have a My Chart account, no smart phone or Internet. She has consented for this visit. She has no real concerns. Needs her Zocor refilled. She is to have a colonoscopy next month - it had been arranged for April but got postponed due to [place]. She denies chest pain. Breathing is ok. Not lightheaded or dizzy. She says she is taking her medicines. BP looks ok today.   Past Medical History:  Diagnosis Date  . Blood in stool   . Chest pain, unspecified   . Headache 07/02/2014  . Heart murmur   . Hepatitis   . History of colon polyps   . HLD (hyperlipidemia)   . HTN (hypertension)   . Prediabetes 07/28/2011  . Seizure disorder [facility])    Past Surgical History:  Procedure Laterality Date  . ABDOMINAL HERNIA REPAIR     lower mid line hernia which has reherniatied  . BUNIONECTOMY Right    traumatic injury to her foot that was repaired  . TONSILLECTOMY    .  TUBAL LIGATION       Current Meds  Medication Sig  . aspirin EC 81 MG tablet Take 1 tablet (81 mg total) by mouth daily.  . benazepril-hydrochlorthiazide (LOTENSIN HCT) 20-12.5 MG tablet Take 1 tablet by mouth daily.  . Multiple Vitamins-Minerals (ICAPS MV PO) Take by mouth daily.  . primidone (MYSOLINE) 50 MG tablet Take 1 tablet (50 mg total) by mouth at bedtime.  . simvastatin (ZOCOR) 20 MG tablet TAKE 1 TABLET BY MOUTH EVERYDAY AT BEDTIME  . [DISCONTINUED] simvastatin (ZOCOR) 20 MG tablet TAKE 1 TABLET BY MOUTH EVERYDAY AT BEDTIME     Allergies:   Penicillins    Social History   Tobacco Use  . Smoking status: Former Smoker    Types: Cigarettes  . Smokeless tobacco: Never Used  Substance Use Topics  . Alcohol use: No    Alcohol/week: 0.0 standard drinks  . Drug use: No     Family Hx: The patient's family history includes Breast cancer in her sister; Heart attack in her daughter and father; Hypertension in her brother, mother, and sister; Rectal cancer in her sister.  ROS:   Please see the history of present illness.   All other systems reviewed are negative.    Objective:    Vital Signs:  BP 133/68   Pulse 61   Temp (!) 97.4 F (36.3 C)   Ht 5\' 4"  (1.626 m)   Wt 142 lb (64.4 kg)   BMI 24.37 kg/m    Wt Readings from Last 3 Encounters:  08/31/18 142 lb (64.4 kg)  06/06/18 138 lb 3.2 oz (62.7 kg)  09/01/17 143 lb 12.8 oz (65.2 kg)    Alert female in no acute distress.Not short of breath with conversation.   Labs/Other Tests and Data Reviewed:    Lab Results  Component Value Date   WBC 7.1 06/06/2018   HGB 12.2 06/06/2018   HCT 36.7 06/06/2018   PLT 167 06/06/2018   GLUCOSE 86 06/06/2018   CHOL 146 10/13/2017   TRIG 46 10/13/2017   HDL 62 10/13/2017   LDLCALC 75 10/13/2017   ALT 11 06/06/2018   AST 18 06/06/2018   NA 142 06/06/2018   K 3.7 06/06/2018   CL 101 06/06/2018   CREATININE 1.00 06/06/2018   BUN 18 06/06/2018   CO2 26 06/06/2018   TSH 1.880 09/01/2017   HGBA1C 5.8 08/10/2012     BNP (last 3 results) No results for input(s): BNP in the last 8760 hours.  ProBNP (last 3 results) No results for input(s): PROBNP in the last 8760 hours.    Prior CV studies:    The following studies were reviewed today:  MRI IMPRESSION of brain 06/2014:  Unremarkable MRI brain (without). Mild diffuse atrophy. No acute findings.    ASSESSMENT & PLAN:    1. HTN -BP is good today. Seems like she is on her medicines. No changes made today.   2. HLD - lipids from June 2019 noted - will plan to recheck on return  visit.   3. Seizure disorder - on Keppra - not discussed today.   4. Right arm tremor - followed by neurology- not discussed today  5. Asymptomatic bradycardia- appears to be asymptomatic - not on any rate slowing medicines.HR today is 61.   6. COVID-19 Education: The signs and symptoms of COVID-19 were discussed with the patient and how to seek care for testing (follow up with PCP or arrange E-visit).  The importance of social distancing, staying  at home, hand hygiene and wearing a mask when out in public were discussed today.  Patient Risk:   After full review of this patient's clinical status, I feel that they are at least moderate risk at this time.  Time:   Today, I have spent 8 minutes with the patient with telehealth technology discussing the above issues.     Medication Adjustments/Labs and Tests Ordered: Current medicines are reviewed at length with the patient today.  Concerns regarding medicines are outlined above.   Tests Ordered: No orders of the defined types were placed in this encounter.   Medication Changes: Meds ordered this encounter  Medications  . simvastatin (ZOCOR) 20 MG tablet    Sig: TAKE 1 TABLET BY MOUTH EVERYDAY AT BEDTIME    Dispense:  90 tablet    Refill:  3    Order Specific Question:   Supervising Provider    Answer:   [place], PETER M [[id number]]    Disposition:  FU with me in 6 months.   Patient is agreeable to this plan and will call if any problems develop in the interim.   Amie Critchley, NP  08/31/2018 11:02 AM    [place]

## 2018-08-30 NOTE — Telephone Encounter (Signed)
Virtual Visit Pre-Appointment Phone Call  "(Name), I am calling you today to discuss your upcoming appointment. We are currently trying to limit exposure to the virus that causes COVID-19 by seeing patients at home rather than in the office."  1. "What is the BEST phone number to call the day of the visit?" - include this in appointment notes  2. "Do you have or have access to (through a family member/friend) a smartphone with video capability that we can use for your visit?" a. If yes - list this number in appt notes as "cell" (if different from BEST phone #) and list the appointment type as a VIDEO visit in appointment notes b. If no - list the appointment type as a PHONE visit in appointment notes  3. Confirm consent - "In the setting of the current Covid19 crisis, you are scheduled for a (phone or video) visit with your provider on (Wednesday, May 13) at (11:00 am ).  Just as we do with many in-office visits, in order for you to participate in this visit, we must obtain consent.  If you'd like, I can send this to your mychart (if signed up) or email for you to review.  Otherwise, I can obtain your verbal consent now.  All virtual visits are billed to your insurance company just like a normal visit would be.  By agreeing to a virtual visit, we'd like you to understand that the technology does not allow for your provider to perform an examination, and thus may limit your provider's ability to fully assess your condition. If your provider identifies any concerns that need to be evaluated in person, we will make arrangements to do so.  Finally, though the technology is pretty good, we cannot assure that it will always work on either your or our end, and in the setting of a video visit, we may have to convert it to a phone-only visit.  In either situation, we cannot ensure that we have a secure connection.  Are you willing to proceed?" STAFF: Did the patient verbally acknowledge consent to telehealth  visit? Document YES/NO here: YES.   4. Advise patient to be prepared - "Two hours prior to your appointment, go ahead and check your blood pressure, pulse, oxygen saturation, and your weight (if you have the equipment to check those) and write them all down. When your visit starts, your provider will ask you for this information. If you have an Apple Watch or Kardia device, please plan to have heart rate information ready on the day of your appointment. Please have a pen and paper handy nearby the day of the visit as well."  5. Give patient instructions for MyChart download to smartphone OR Doximity/Doxy.me as below if video visit (depending on what platform provider is using)  6. Inform patient they will receive a phone call 15 minutes prior to their appointment time (may be from unknown caller ID) so they should be prepared to answer    TELEPHONE CALL NOTE  Robin Anderson has been deemed a candidate for a follow-up tele-health visit to limit community exposure during the Covid-19 pandemic. I spoke with the patient via phone to ensure availability of phone/video source, confirm preferred email & phone number, and discuss instructions and expectations.  I reminded Robin Anderson to be prepared with any vital sign and/or heart rhythm information that could potentially be obtained via home monitoring, at the time of her visit. I reminded Robin Anderson to expect a phone call  prior to her visit.  Robin Anderson 08/30/2018 9:05 AM   INSTRUCTIONS FOR DOWNLOADING THE MYCHART APP TO SMARTPHONE  - The patient must first make sure to have activated MyChart and know their login information - If Apple, go to [employer] and type in MyChart in the search bar and download the app. If Android, ask patient to go to [employer] and type in [place] in the search bar and download the app. The app is free but as with any other app downloads, their phone may require them to verify saved payment information or  Apple/Android password.  - The patient will need to then log into the app with their MyChart username and password, and select [facility] as their healthcare provider to link the account. When it is time for your visit, go to the MyChart app, find appointments, and click Begin Video Visit. Be sure to Select Allow for your device to access the Microphone and Camera for your visit. You will then be connected, and your provider will be with you shortly.  **If they have any issues connecting, or need assistance please contact MyChart service desk [phone])**  **If using a computer, in order to ensure the best quality for their visit they will need to use either of the following Internet Browsers: [employer], or Google Chrome**  IF USING DOXIMITY or DOXY.ME - The patient will receive a link just prior to their visit by text.     FULL LENGTH CONSENT FOR TELE-HEALTH VISIT   I hereby voluntarily request, consent and authorize [facility] and its employed or contracted physicians, physician assistants, nurse practitioners or other licensed health care professionals (the Practitioner), to provide me with telemedicine health care services (the "Services") as deemed necessary by the treating Practitioner. I acknowledge and consent to receive the Services by the Practitioner via telemedicine. I understand that the telemedicine visit will involve communicating with the Practitioner through live audiovisual communication technology and the disclosure of certain medical information by electronic transmission. I acknowledge that I have been given the opportunity to request an in-person assessment or other available alternative prior to the telemedicine visit and am voluntarily participating in the telemedicine visit.  I understand that I have the right to withhold or withdraw my consent to the use of telemedicine in the course of my care at any time, without affecting my right to future care  or treatment, and that the Practitioner or I may terminate the telemedicine visit at any time. I understand that I have the right to inspect all information obtained and/or recorded in the course of the telemedicine visit and may receive copies of available information for a reasonable fee.  I understand that some of the potential risks of receiving the Services via telemedicine include:  Marland Kitchen Delay or interruption in medical evaluation due to technological equipment failure or disruption; . Information transmitted may not be sufficient (e.g. poor resolution of images) to allow for appropriate medical decision making by the Practitioner; and/or  . In rare instances, security protocols could fail, causing a breach of personal health information.  Furthermore, I acknowledge that it is my responsibility to provide information about my medical history, conditions and care that is complete and accurate to the best of my ability. I acknowledge that Practitioner's advice, recommendations, and/or decision may be based on factors not within their control, such as incomplete or inaccurate data provided by me or distortions of diagnostic images or specimens that may result from electronic  transmissions. I understand that the practice of medicine is not an exact science and that Practitioner makes no warranties or guarantees regarding treatment outcomes. I acknowledge that I will receive a copy of this consent concurrently upon execution via email to the email address I last provided but may also request a printed copy by calling the office of [facility].    I understand that my insurance will be billed for this visit.   I have read or had this consent read to me. . I understand the contents of this consent, which adequately explains the benefits and risks of the Services being provided via telemedicine.  . I have been provided ample opportunity to ask questions regarding this consent and the Services and have had  my questions answered to my satisfaction. . I give my informed consent for the services to be provided through the use of telemedicine in my medical care  By participating in this telemedicine visit I agree to the above.

## 2018-08-31 ENCOUNTER — Encounter: Payer: Self-pay | Admitting: Nurse Practitioner

## 2018-08-31 ENCOUNTER — Other Ambulatory Visit: Payer: Self-pay

## 2018-08-31 ENCOUNTER — Telehealth (INDEPENDENT_AMBULATORY_CARE_PROVIDER_SITE_OTHER): Payer: Medicare Other | Admitting: Nurse Practitioner

## 2018-08-31 VITALS — BP 133/68 | HR 61 | Temp 97.4°F | Ht 64.0 in | Wt 142.0 lb

## 2018-08-31 DIAGNOSIS — I10 Essential (primary) hypertension: Secondary | ICD-10-CM

## 2018-08-31 DIAGNOSIS — Z7189 Other specified counseling: Secondary | ICD-10-CM

## 2018-08-31 DIAGNOSIS — E785 Hyperlipidemia, unspecified: Secondary | ICD-10-CM

## 2018-08-31 MED ORDER — SIMVASTATIN 20 MG PO TABS
ORAL_TABLET | ORAL | 3 refills | Status: DC
Start: 2018-08-31 — End: 2019-08-18

## 2018-08-31 NOTE — Patient Instructions (Signed)
After Visit Summary:  We will be checking the following labs today - NONE    Medication Instructions:    Continue with your current medicines.   I have sent in your refill today.    If you need a refill on your cardiac medications before your next appointment, please call your pharmacy.     Testing/Procedures To Be Arranged:  N/A  Follow-Up:   See me in about 6 months with fasting lab.    At [facility], you and your health needs are our priority.  As part of our continuing mission to provide you with exceptional heart care, we have created designated Provider Care Teams.  These Care Teams include your primary Cardiologist (physician) and Advanced Practice Providers (APPs -  Physician Assistants and Nurse Practitioners) who all work together to provide you with the care you need, when you need it.  Special Instructions:  . Stay safe, stay home, wash your hands for at least 20 seconds and wear a mask when out in public.  . It was good to talk with you today.    Call the [facility] office at [phone] if you have any questions, problems or concerns.

## 2018-09-01 ENCOUNTER — Telehealth: Payer: Self-pay

## 2018-09-01 NOTE — Telephone Encounter (Signed)
Spoke with the patient and she has given verbal consent to file her insurance and to do a telephone visit. She stated that she doesn't have access to do a doxy.me visit with Judson Roch.

## 2018-09-05 ENCOUNTER — Other Ambulatory Visit: Payer: Self-pay

## 2018-09-05 ENCOUNTER — Other Ambulatory Visit: Payer: Self-pay | Admitting: Nurse Practitioner

## 2018-09-05 ENCOUNTER — Encounter: Payer: Self-pay | Admitting: Neurology

## 2018-09-05 ENCOUNTER — Ambulatory Visit (INDEPENDENT_AMBULATORY_CARE_PROVIDER_SITE_OTHER): Payer: Medicare Other | Admitting: Neurology

## 2018-09-05 DIAGNOSIS — R251 Tremor, unspecified: Secondary | ICD-10-CM

## 2018-09-05 DIAGNOSIS — G40409 Other generalized epilepsy and epileptic syndromes, not intractable, without status epilepticus: Secondary | ICD-10-CM | POA: Diagnosis not present

## 2018-09-05 MED ORDER — PRIMIDONE 50 MG PO TABS: 100.0000 mg | ORAL_TABLET | Freq: Every day | ORAL | 3 refills | Status: DC

## 2018-09-05 NOTE — Progress Notes (Signed)
I have read the note, and I agree with the clinical assessment and plan.  Yeriel Mineo K Adean Milosevic   

## 2018-09-05 NOTE — Progress Notes (Signed)
Virtual Visit via Telephone Note  I connected with Robin Anderson on 09/05/18 at 10:45 AM EDT by telephone and verified that I am speaking with the correct person using two identifiers.   I discussed the limitations, risks, security and privacy concerns of performing an evaluation and management service by telephone and the availability of in person appointments. I also discussed with the patient that there may be a patient responsible charge related to this service. The patient expressed understanding and agreed to proceed.  History of Present Illness: 09/05/2018 SS: Robin Anderson is a 78 year old female with history of seizure disorder and tremor in her right hand.  At last visit she was started on primidone 50 mg at bedtime for seizures and tremor.  She reports that her tremor has improved with the medication.  She has not had any recurrent seizures.  She says that she did have an episode where her tongue turned [place] and she had a mark on the center of her tongue.  This quickly resolved, has not reoccurred. She says she didn't even go to the doctor for it.  In the past she has stopped her Keppra due to reports of moles on her face and scalp.  She denies any falls.  She does not drive a car.  She lives with her husband, she manages her own medications. She says she is tolerating the medication without problem.   06/06/18 SS: Robin Anderson is a 78 year old female who presents for follow-up today for seizure disorder and tremor.  She reports her last seizure was in September 2017 when she woke up and found that she had bitten her gum.  At that time her Keppra was increased to 500 mg twice a day.  She reports that last October she stopped taking her Keppra because she began to develop moles and dark spots on her face and scalp.  She reports that she has not had any seizure events.  She reports that she does not drive a car.  She continues to complain of a tremor to her right hand.  She reports that the tremor is  most bothersome when she is trying to do something or if she is trying to pick something up.  She also reports that her handwriting is greatly affected and she is right-handed.  She reports that the tremor in her right hand continues to get worse.  She reports that she has been occasionally noticed some tremor to the right side of her head. She denies any falls or gait problems.  She presents today for follow-up unaccompanied   Observations/Objective: Telephone call, reports her vital signs are: wt 141 lbs, BP 136/69, HR 75, 97.5 temperature   Alert, answers questions appropriately, fair historian  Assessment and Plan: 1.  Seizures 2.  Essential tremor  She is on a very low dose of primidone at 50 mg at bedtime.  We will increase her dose to 100 mg at bedtime for seizure prevention and for tremor.  The primidone has been beneficial for her tremor.  I will send in a new prescription for the dose increase.  She will call back in 1 month.  If she is tolerating the medication we may increase up to 150 mg at bedtime. We may check a primidone level at that time.  She has not had any recurrent seizures. She had seizure episode in 2018 while taking Keppra 250 mg in the morning, 500 mg in the evening. The episode was described as having woken  up in the morning and bitten her lip.  We will try to slowly increase her primidone level.  Follow Up Instructions: I have made her a follow-up appointment in 4 months, 01/11/2019 9:15.  She will call in 1 month to let me know how she is tolerating the primidone 100 mg at bedtime.  At that time we will likely increase to 150 mg at bedtime.  I discussed the assessment and treatment plan with the patient. The patient was provided an opportunity to ask questions and all were answered. The patient agreed with the plan and demonstrated an understanding of the instructions.   The patient was advised to call back or seek an in-person evaluation if the symptoms worsen or if  the condition fails to improve as anticipated.  I provided 25 minutes of non-face-to-face time during this encounter.   Evangeline Dakin, DNP  [facility] [address], [place], [place] [address] [phone]

## 2018-09-06 ENCOUNTER — Ambulatory Visit: Payer: Medicare Other | Admitting: Gastroenterology

## 2018-09-20 DIAGNOSIS — R569 Unspecified convulsions: Secondary | ICD-10-CM | POA: Diagnosis not present

## 2018-09-20 DIAGNOSIS — G250 Essential tremor: Secondary | ICD-10-CM | POA: Diagnosis not present

## 2018-09-20 DIAGNOSIS — E785 Hyperlipidemia, unspecified: Secondary | ICD-10-CM | POA: Diagnosis not present

## 2018-09-20 DIAGNOSIS — Z0000 Encounter for general adult medical examination without abnormal findings: Secondary | ICD-10-CM | POA: Diagnosis not present

## 2018-09-20 DIAGNOSIS — D259 Leiomyoma of uterus, unspecified: Secondary | ICD-10-CM | POA: Diagnosis not present

## 2018-09-20 DIAGNOSIS — Z008 Encounter for other general examination: Secondary | ICD-10-CM | POA: Diagnosis not present

## 2018-09-20 DIAGNOSIS — R110 Nausea: Secondary | ICD-10-CM | POA: Diagnosis not present

## 2018-09-20 DIAGNOSIS — I10 Essential (primary) hypertension: Secondary | ICD-10-CM | POA: Diagnosis not present

## 2018-10-07 ENCOUNTER — Other Ambulatory Visit: Payer: Self-pay | Admitting: Neurology

## 2018-10-07 DIAGNOSIS — Z124 Encounter for screening for malignant neoplasm of cervix: Secondary | ICD-10-CM | POA: Diagnosis not present

## 2018-10-07 DIAGNOSIS — I10 Essential (primary) hypertension: Secondary | ICD-10-CM | POA: Diagnosis not present

## 2018-10-10 NOTE — Telephone Encounter (Signed)
I called the patient.  She has been started on primidone 50 mg tablet for tremor and seizures.  After her last visit in May 2020 she was to increase her primidone to 100 mg at bedtime.  She reports she never did this.  She reports her tremor has improved.  She will go ahead and increase the dose taking 100 mg at bedtime x 2 weeks, then increase to 150 mg at bedtime. She has appointment scheduled in September. At that time we can check a primidone level. We need to get her dose up to 250 mg at bedtime for adequate seizure control if she is able to tolerate. We will go up slowly by 50's. Her last seizure was in 2017, where she woke up and bit her cheek.

## 2018-10-26 DIAGNOSIS — Z79899 Other long term (current) drug therapy: Secondary | ICD-10-CM | POA: Diagnosis not present

## 2018-10-26 DIAGNOSIS — G250 Essential tremor: Secondary | ICD-10-CM | POA: Diagnosis not present

## 2018-10-26 DIAGNOSIS — J309 Allergic rhinitis, unspecified: Secondary | ICD-10-CM | POA: Diagnosis not present

## 2018-10-26 DIAGNOSIS — Z0000 Encounter for general adult medical examination without abnormal findings: Secondary | ICD-10-CM | POA: Diagnosis not present

## 2018-11-07 ENCOUNTER — Other Ambulatory Visit: Payer: Self-pay | Admitting: Family Medicine

## 2018-11-07 DIAGNOSIS — Z1231 Encounter for screening mammogram for malignant neoplasm of breast: Secondary | ICD-10-CM

## 2018-12-01 ENCOUNTER — Other Ambulatory Visit: Payer: Self-pay | Admitting: Neurology

## 2018-12-22 ENCOUNTER — Other Ambulatory Visit: Payer: Self-pay

## 2018-12-22 ENCOUNTER — Ambulatory Visit
Admission: RE | Admit: 2018-12-22 | Discharge: 2018-12-22 | Disposition: A | Discharge status: Home / Self Care | Payer: Medicare Other | Source: Ambulatory Visit | Attending: Family | Admitting: Family

## 2018-12-22 ENCOUNTER — Other Ambulatory Visit: Payer: Self-pay | Admitting: Family Medicine

## 2018-12-22 DIAGNOSIS — Z1231 Encounter for screening mammogram for malignant neoplasm of breast: Secondary | ICD-10-CM

## 2018-12-22 DIAGNOSIS — N63 Unspecified lump in unspecified breast: Secondary | ICD-10-CM

## 2019-01-10 NOTE — Progress Notes (Signed)
PATIENT: Robin Anderson DOB: 1940/07/11  REASON FOR VISIT: follow up HISTORY FROM: patient  HISTORY OF PRESENT ILLNESS: Today 01/11/19  Robin Anderson is a 78 year old female with history of seizure disorder and tremor in her right hand.  In the past she has been taking Keppra, but stopped due to report of moles on her face and scalp.  After last visit she was started on primidone for seizure prevention and tremor.  The primidone has been beneficial for her tremor, more than 75%.  The tremor is not constant and it does not prevent her from doing anything.  She has not had recurrent seizure.  She is currently taking primidone 150 mg at bedtime.  She last had a seizure in 2018 while taking Keppra 250 mg in the morning, 500 mg the evening.  The seizure episode was described as having woken up in the morning and bitten her lip.  She reports she has been helped have a diagnostic mammogram for a lump in her left breast.  She is checking her blood pressure at home.  She says her BP is elevated today because she ate bacon.  She lives with her husband.  She does not drive a car.  She presents today unaccompanied.  HISTORY  09/05/2018 SS: Robin Anderson is a 78 year old female with history of seizure disorder and tremor in her right hand.  At last visit she was started on primidone 50 mg at bedtime for seizures and tremor.  She reports that her tremor has improved with the medication.  She has not had any recurrent seizures.  She says that she did have an episode where her tongue turned Carte and she had a mark on the center of her tongue.  This quickly resolved, has not reoccurred. She says she didn't even go to the doctor for it.  In the past she has stopped her Keppra due to reports of moles on her face and scalp.  She denies any falls.  She does not drive a car.  She lives with her husband, she manages her own medications. She says she is tolerating the medication without problem.   06/06/18 SS:Robin Anderson is  a 78 year old female who presents for follow-up today for seizure disorder and tremor. She reports her last seizure was in September 2017 when she woke up and found that she had bitten her gum. At that time her Keppra was increased to 500 mg twice a day. She reports that last October she stopped taking her Keppra because she began to develop moles and dark spots on her face and scalp. She reports that she has not had any seizure events. She reports that she does not drive a car. She continues to complain of a tremor to her right hand. She reports that the tremor is most bothersome when she is trying to do something or if she is trying to pick something up. She also reports that her handwriting is greatly affected and she is right-handed. She reports that the tremor in her right hand continues to get worse. She reports that she has been occasionally noticed some tremor to the right side of her head. She denies any falls or gait problems. She presents today for follow-up unaccompanied  REVIEW OF SYSTEMS: Out of a complete 14 system review of symptoms, the patient complains only of the following symptoms, and all other reviewed systems are negative.  Ringing in ears, runny nose, seizures, tremor   ALLERGIES: Allergies  Allergen Reactions  .  Penicillins Rash    HOME MEDICATIONS: Outpatient Medications Prior to Visit  Medication Sig Dispense Refill  . aspirin EC 81 MG tablet Take 1 tablet (81 mg total) by mouth daily.    . benazepril-hydrochlorthiazide (LOTENSIN HCT) 20-12.5 MG tablet TAKE 1 TABLET BY MOUTH EVERY DAY 30 tablet 11  . Multiple Vitamins-Minerals (ICAPS MV PO) Take by mouth daily.    . primidone (MYSOLINE) 50 MG tablet 3 tablets at bedtime 90 tablet 3  . simvastatin (ZOCOR) 20 MG tablet TAKE 1 TABLET BY MOUTH EVERYDAY AT BEDTIME 90 tablet 3   No facility-administered medications prior to visit.     PAST MEDICAL HISTORY: Past Medical History:  Diagnosis Date  . Blood in  stool   . Chest pain, unspecified   . Headache 07/02/2014  . Heart murmur   . Hepatitis   . History of colon polyps   . HLD (hyperlipidemia)   . HTN (hypertension)   . Prediabetes 07/28/2011  . Seizure disorder ([place])     PAST SURGICAL HISTORY: Past Surgical History:  Procedure Laterality Date  . ABDOMINAL HERNIA REPAIR     lower mid line hernia which has reherniatied  . BUNIONECTOMY Right    traumatic injury to her foot that was repaired  . TONSILLECTOMY    . TUBAL LIGATION      FAMILY HISTORY: Family History  Problem Relation Age of Onset  . Hypertension Mother   . Heart attack Father   . Hypertension Sister        5 sisters....3 have HTN  . Hypertension Brother   . Breast cancer Sister   . Rectal cancer Sister   . Heart attack Daughter     SOCIAL HISTORY: Social History   Socioeconomic History  . Marital status: Married    Spouse name: [name]  . Number of children: 6  . Years of education: 63  . Highest education level: Not on file  Occupational History  . Occupation: retired  Scientific laboratory technician  . Financial resource strain: Not on file  . Food insecurity    Worry: Not on file    Inability: Not on file  . Transportation needs    Medical: Not on file    Non-medical: Not on file  Tobacco Use  . Smoking status: Former Smoker    Types: Cigarettes  . Smokeless tobacco: Never Used  Substance and Sexual Activity  . Alcohol use: No    Alcohol/week: 0.0 standard drinks  . Drug use: No  . Sexual activity: Not on file  Lifestyle  . Physical activity    Days per week: Not on file    Minutes per session: Not on file  . Stress: Not on file  Relationships  . Social Herbalist on phone: Not on file    Gets together: Not on file    Attends religious service: Not on file    Active member of club or organization: Not on file    Attends meetings of clubs or organizations: Not on file    Relationship status: Not on file  . Intimate partner violence    Fear  of current or ex partner: Not on file    Emotionally abused: Not on file    Physically abused: Not on file    Forced sexual activity: Not on file  Other Topics Concern  . Not on file  Social History Narrative   Patient lives at home with her husband Anais Mcgroarty.   Retired.  Education high school.   Right handed..      Regular exercise-yes   Caffeine Use-yes   PHYSICAL EXAM  Vitals:   01/11/19 0921  BP: (!) 162/91  Pulse: 70  Temp: (!) 97.3 F (36.3 C)  Weight: 144 lb 3.2 oz (65.4 kg)  Height: 5\' 4"  (1.626 m)   Body mass index is 24.75 kg/m.  Generalized: Well developed, in no acute distress   Neurological examination  Mentation: Alert oriented to time, place, history taking. Follows all commands speech and language fluent Cranial nerve II-XII: Pupils were equal round reactive to light. Extraocular movements were full, visual field were full on confrontational test. Facial sensation and strength were normal. Head turning and shoulder shrug  were normal and symmetric. Motor: The motor testing reveals 5 over 5 strength of all 4 extremities. Good symmetric motor tone is noted throughout. Mild intention tremor noted to right arm.  Sensory: Sensory testing is intact to soft touch on all 4 extremities. No evidence of extinction is noted.  Coordination: Cerebellar testing reveals good finger-nose-finger and heel-to-shin bilaterally.  Gait and station: Gait is normal. Tandem gait is normal. Romberg is negative. No drift is seen.  Reflexes: Deep tendon reflexes are symmetric and normal bilaterally.   DIAGNOSTIC DATA (LABS, IMAGING, TESTING) - I reviewed patient records, labs, notes, testing and imaging myself where available.  Lab Results  Component Value Date   WBC 7.1 06/06/2018   HGB 12.2 06/06/2018   HCT 36.7 06/06/2018   MCV 83 06/06/2018   PLT 167 06/06/2018      Component Value Date/Time   NA 142 06/06/2018 1414   K 3.7 06/06/2018 1414   CL 101 06/06/2018 1414    CO2 26 06/06/2018 1414   GLUCOSE 86 06/06/2018 1414   GLUCOSE 78 09/11/2015 0944   BUN 18 06/06/2018 1414   CREATININE 1.00 06/06/2018 1414   CREATININE 0.95 (H) 09/11/2015 0944   CALCIUM 9.5 06/06/2018 1414   PROT 7.3 06/06/2018 1414   ALBUMIN 4.6 06/06/2018 1414   AST 18 06/06/2018 1414   ALT 11 06/06/2018 1414   ALKPHOS 89 06/06/2018 1414   BILITOT 0.5 06/06/2018 1414   GFRNONAA 55 (L) 06/06/2018 1414   GFRAA 64 06/06/2018 1414   Lab Results  Component Value Date   CHOL 146 10/13/2017   HDL 62 10/13/2017   LDLCALC 75 10/13/2017   TRIG 46 10/13/2017   CHOLHDL 2.4 10/13/2017   Lab Results  Component Value Date   HGBA1C 5.8 08/10/2012   No results found for: VITAMINB12 Lab Results  Component Value Date   TSH 1.880 09/01/2017    ASSESSMENT AND PLAN 78 y.o. year old female  has a past medical history of Blood in stool, Chest pain, unspecified, Headache (07/02/2014), Heart murmur, Hepatitis, History of colon polyps, HLD (hyperlipidemia), HTN (hypertension), Prediabetes (07/28/2011), and Seizure disorder ([place]). here with:  1.  Seizures 2.  Essential tremor  She has seen more than 80% improvement in her tremor in her right hand while taking primidone.  She has not had recurrent seizure.  She is tolerating primidone without side effect. She will continue taking primidone 150 mg at bedtime.  I will check routine lab work today, including a primidone level.  Once the level results, I will discuss with Dr. Jannifer Franklin to see if the dose needs to be increased for adequate seizure control (will refill primidone at that time).  She will follow-up in 6 months or sooner if needed.  I did advise  that if her symptoms worsen or she develops any new symptoms she should let us know.  I spent 25 minutes with the patient. 50% of this time was spent discussing her plan of care.   Butler Denmark, AGNP-C, DNP 01/11/2019, 9:41 AM [facility] [address], [place], [place]  [address] [phone]

## 2019-01-11 ENCOUNTER — Ambulatory Visit (INDEPENDENT_AMBULATORY_CARE_PROVIDER_SITE_OTHER): Payer: Medicare Other | Admitting: Neurology

## 2019-01-11 ENCOUNTER — Other Ambulatory Visit: Payer: Self-pay

## 2019-01-11 ENCOUNTER — Encounter: Payer: Self-pay | Admitting: Neurology

## 2019-01-11 VITALS — BP 162/91 | HR 70 | Temp 97.3°F | Ht 64.0 in | Wt 144.2 lb

## 2019-01-11 DIAGNOSIS — G40409 Other generalized epilepsy and epileptic syndromes, not intractable, without status epilepticus: Secondary | ICD-10-CM | POA: Diagnosis not present

## 2019-01-11 DIAGNOSIS — R251 Tremor, unspecified: Secondary | ICD-10-CM

## 2019-01-11 NOTE — Progress Notes (Signed)
I have read the note, and I agree with the clinical assessment and plan.  Charles K Willis   

## 2019-01-11 NOTE — Patient Instructions (Signed)
1. For now, continue current dose of primidone. Once labs return, I will call you and let you know if we need to adjust the dosing.

## 2019-01-12 LAB — CBC WITH DIFFERENTIAL/PLATELET
Basophils Absolute: 0 10*3/uL (ref 0.0–0.2)
Basos: 1 %
EOS (ABSOLUTE): 0.2 10*3/uL (ref 0.0–0.4)
Eos: 3 %
Hematocrit: 38.8 % (ref 34.0–46.6)
Hemoglobin: 12 g/dL (ref 11.1–15.9)
Immature Grans (Abs): 0 10*3/uL (ref 0.0–0.1)
Immature Granulocytes: 0 %
Lymphocytes Absolute: 0.8 10*3/uL (ref 0.7–3.1)
Lymphs: 14 %
MCH: 27.2 pg (ref 26.6–33.0)
MCHC: 30.9 g/dL — ABNORMAL LOW (ref 31.5–35.7)
MCV: 88 fL (ref 79–97)
Monocytes Absolute: 0.4 10*3/uL (ref 0.1–0.9)
Monocytes: 7 %
Neutrophils Absolute: 4.2 10*3/uL (ref 1.4–7.0)
Neutrophils: 75 %
Platelets: 163 10*3/uL (ref 150–450)
RBC: 4.41 x10E6/uL (ref 3.77–5.28)
RDW: 13.2 % (ref 11.7–15.4)
WBC: 5.7 10*3/uL (ref 3.4–10.8)

## 2019-01-12 LAB — COMPREHENSIVE METABOLIC PANEL
ALT: 10 IU/L (ref 0–32)
AST: 14 IU/L (ref 0–40)
Albumin/Globulin Ratio: 1.5 (ref 1.2–2.2)
Albumin: 4.3 g/dL (ref 3.7–4.7)
Alkaline Phosphatase: 102 IU/L (ref 39–117)
BUN/Creatinine Ratio: 13 (ref 12–28)
BUN: 15 mg/dL (ref 8–27)
Bilirubin Total: 0.2 mg/dL (ref 0.0–1.2)
CO2: 25 mmol/L (ref 20–29)
Calcium: 9.2 mg/dL (ref 8.7–10.3)
Chloride: 102 mmol/L (ref 96–106)
Creatinine, Ser: 1.13 mg/dL — ABNORMAL HIGH (ref 0.57–1.00)
GFR calc Af Amer: 55 mL/min/{1.73_m2} — ABNORMAL LOW (ref >59)
GFR calc non Af Amer: 47 mL/min/{1.73_m2} — ABNORMAL LOW (ref >59)
Globulin, Total: 2.8 g/dL (ref 1.5–4.5)
Glucose: 88 mg/dL (ref 65–99)
Potassium: 3.6 mmol/L (ref 3.5–5.2)
Sodium: 141 mmol/L (ref 134–144)
Total Protein: 7.1 g/dL (ref 6.0–8.5)

## 2019-01-12 LAB — PRIMIDONE, SERUM
Phenobarbital, Serum: 5 ug/mL — ABNORMAL LOW (ref 15–40)
Primidone Lvl: 7.1 ug/mL (ref 5.0–12.0)

## 2019-01-18 ENCOUNTER — Telehealth: Payer: Self-pay | Admitting: *Deleted

## 2019-01-18 NOTE — Telephone Encounter (Signed)
Spoke with patient and informed her that in her labs the Primidone level looks good. We will stay at current dose, and Sarah, NP did discuss this with Dr. Jannifer Franklin. The primidone is helping to treat your seizures and tremor. The tremor is much better and no seizures. She verbalized understanding, appreciation.

## 2019-01-27 ENCOUNTER — Other Ambulatory Visit: Payer: Self-pay | Admitting: Family Medicine

## 2019-01-27 ENCOUNTER — Ambulatory Visit
Admission: RE | Admit: 2019-01-27 | Discharge: 2019-01-27 | Disposition: A | Discharge status: Home / Self Care | Payer: Medicare Other | Source: Ambulatory Visit | Attending: Family Medicine | Admitting: Family Medicine

## 2019-01-27 ENCOUNTER — Other Ambulatory Visit: Payer: Self-pay

## 2019-01-27 DIAGNOSIS — N63 Unspecified lump in unspecified breast: Secondary | ICD-10-CM

## 2019-01-27 DIAGNOSIS — N6324 Unspecified lump in the left breast, lower inner quadrant: Secondary | ICD-10-CM | POA: Diagnosis not present

## 2019-01-27 DIAGNOSIS — N6320 Unspecified lump in the left breast, unspecified quadrant: Secondary | ICD-10-CM

## 2019-01-27 DIAGNOSIS — R928 Other abnormal and inconclusive findings on diagnostic imaging of breast: Secondary | ICD-10-CM | POA: Diagnosis not present

## 2019-01-27 IMAGING — US US BREAST*L* LIMITED INC AXILLA
1 series · 13 of 24 positions shown · non-contrast
Comparison: Previous exam(s).

CLINICAL DATA: 78-year-old female presenting for evaluation of a
palpable lump in the left breast which she initially found in [REDACTED].
She does have family history of breast cancer in her sister who was
diagnosed at age 72.

EXAM:
DIGITAL DIAGNOSTIC BILATERAL MAMMOGRAM WITH CAD AND TOMO
ULTRASOUND LEFT BREAST

[Series 1: us breast*left* limited inc axilla · 0.06mm/px · 13 of 24 slices shown]
[im 1/24]
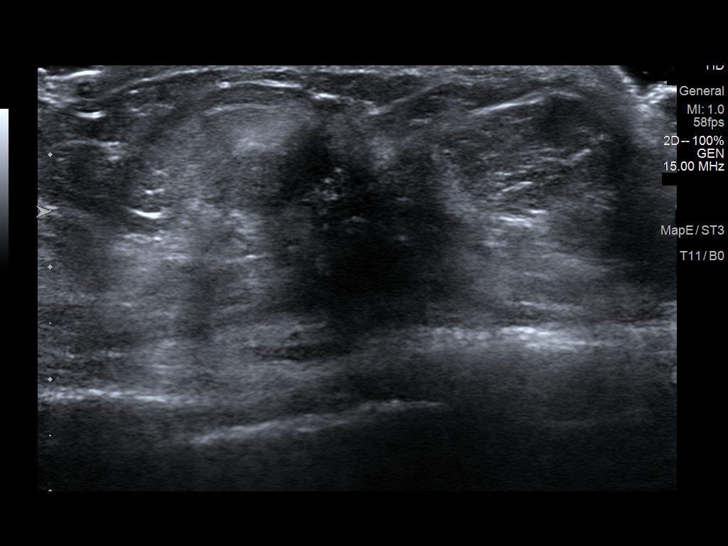
[im 3/24]
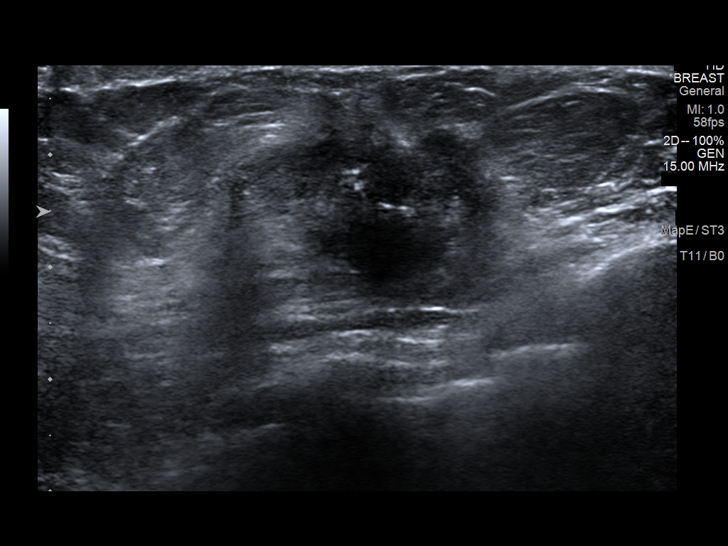
[im 5/24]
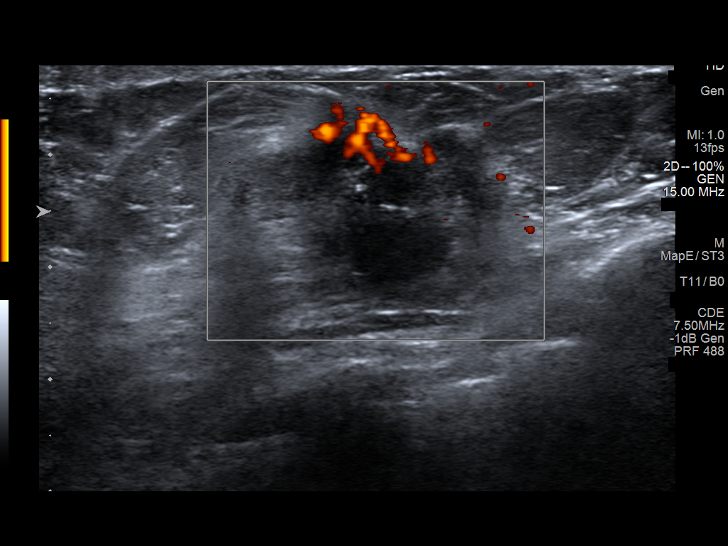
[im 7/24]
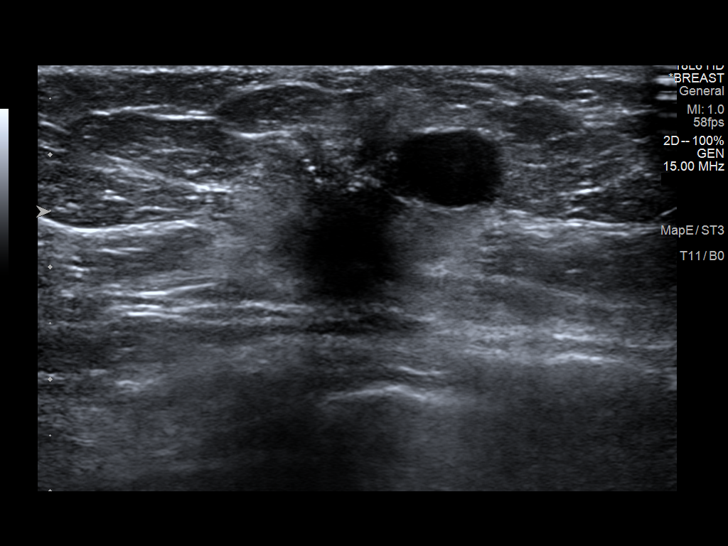
[im 9/24]
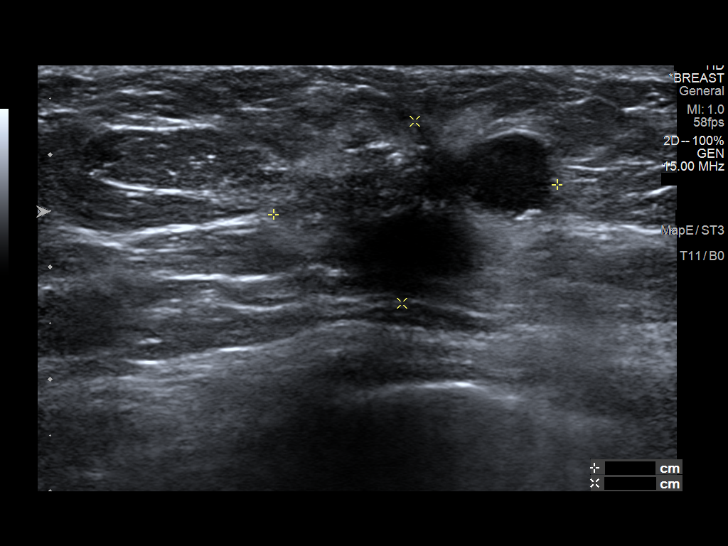
[im 11/24]
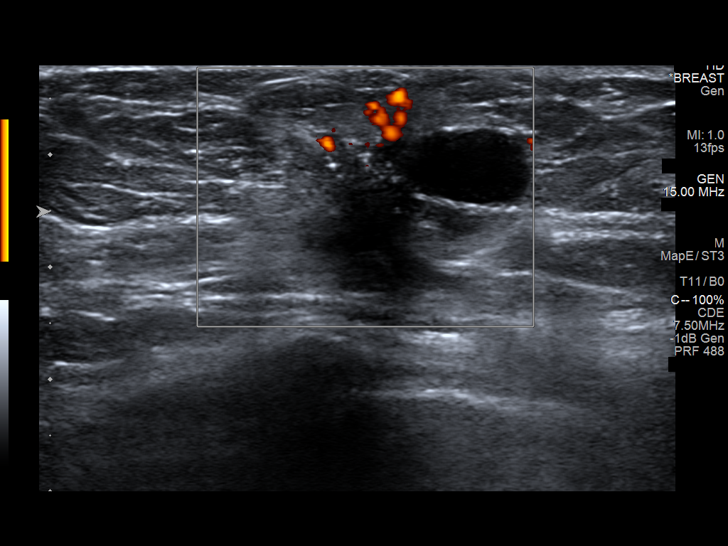
[im 13/24]
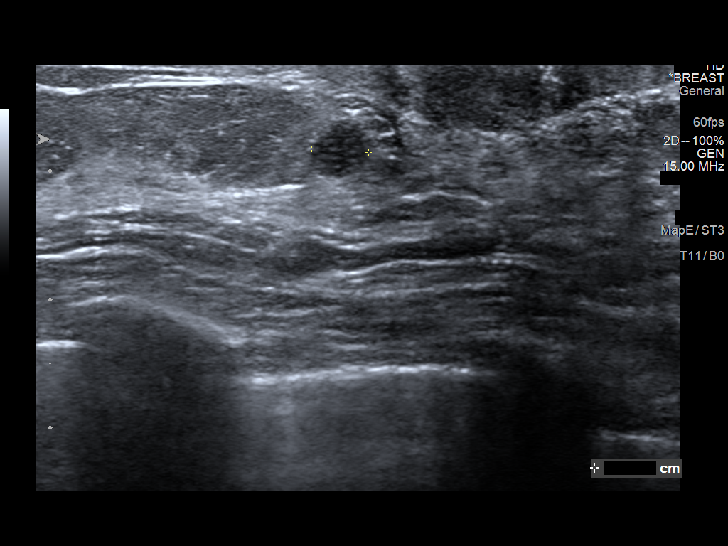
[im 14/24]
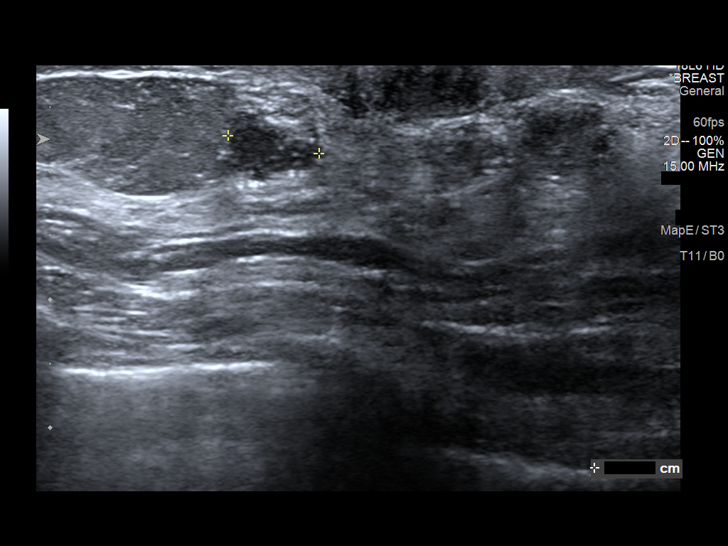
[im 16/24]
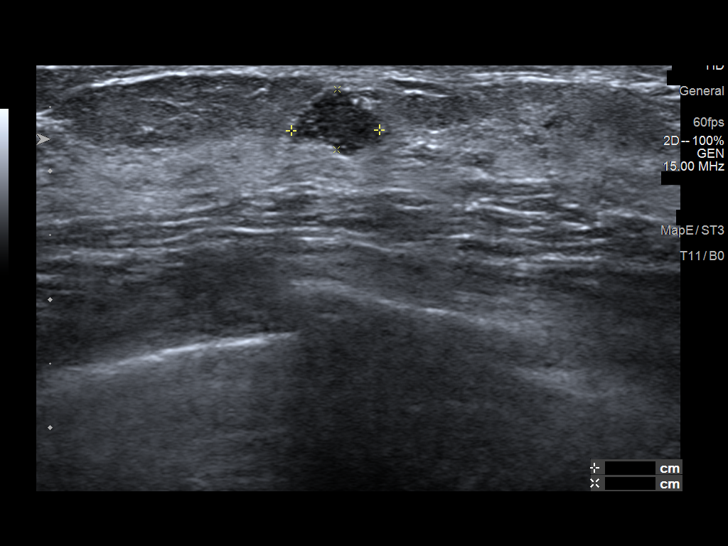
[im 18/24]
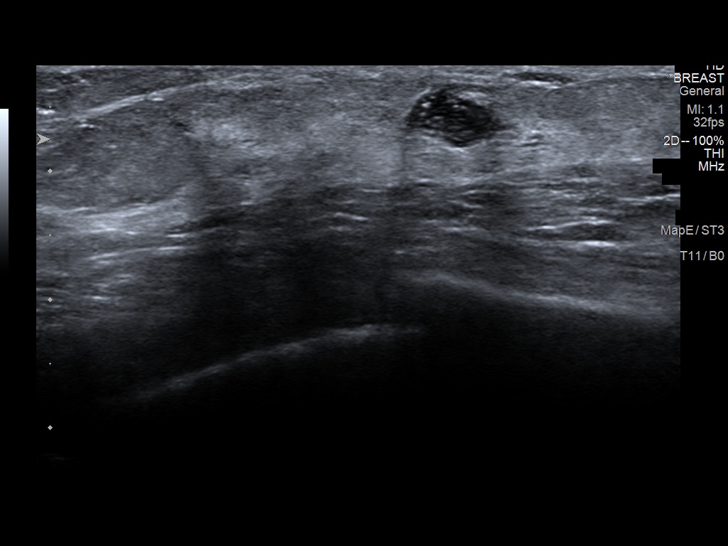
[im 20/24]
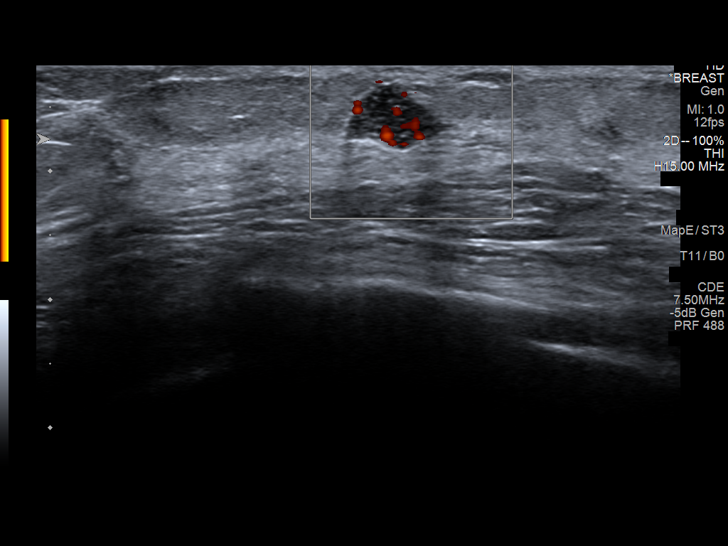
[im 22/24]
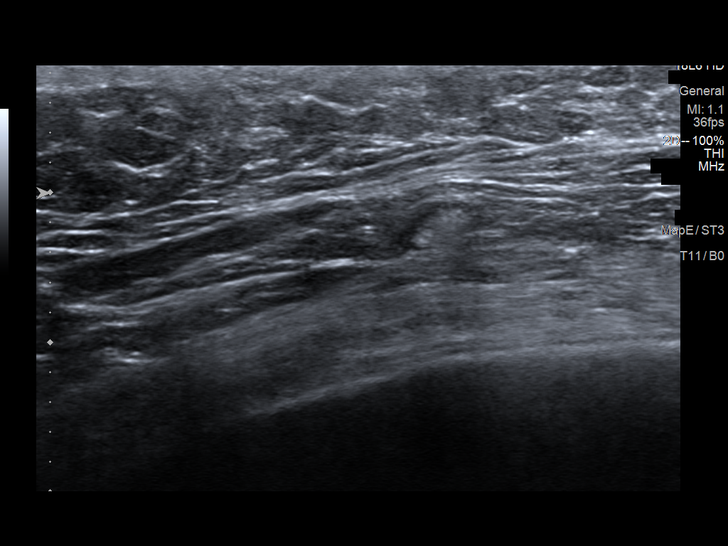
[im 24/24]
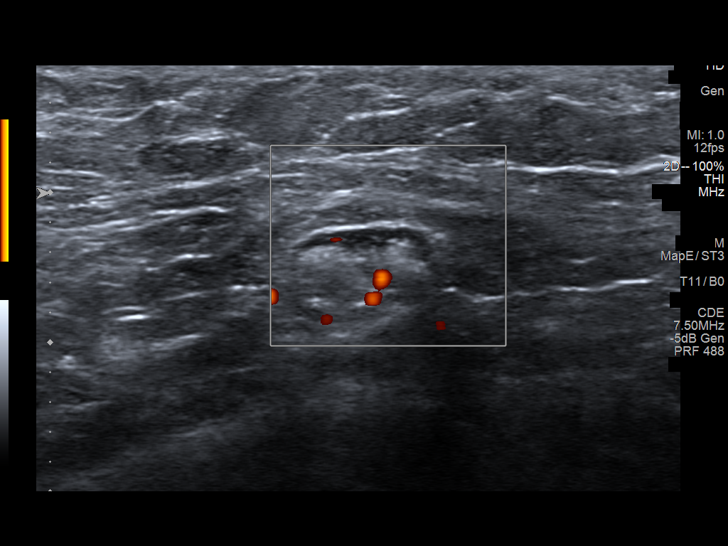

[13 of 24 positions shown; findings below may reference images not displayed]

ACR Breast Density Category b: There are scattered areas of
fibroglandular density.
FINDINGS: In the central slightly inferior far posterior left breast, there is
a partially visualized irregular mass with spiculated margins and
associated suspicious pleomorphic calcifications. Mass measures at
least 2.9 cm. Magnified images show that the calcifications are
largely within the mass, with a few trailing about one centimeter
inferior to the mass. In the retroareolar left breast, there is a
smaller mass measuring approximately 9 mm with associated internal
pleomorphic calcifications. No other suspicious calcifications,
masses or areas of distortion are seen in the bilateral breasts.

Mammographic images were processed with CAD.

Ultrasound of the left breast at 6 o'clock, 4 cm from the nipple
demonstrates an irregular hypoechoic mass with indistinct margins
measuring approximately 2.5 x 1.6 x 2.3 cm. Flow seen within the
mass on color Doppler imaging. In the retroareolar left breast at 12
o'clock, there is an irregular hypoechoic mass angular margins
measuring 0.7 x 0.5 x 0.7 cm. Ultrasound of the left axilla
demonstrates normal-appearing lymph nodes.
IMPRESSION: 1. There is a highly suspicious mass with calcifications in the
inferior left breast at the palpable site of concern.

2. There is an indeterminate retroareolar left breast mass with
calcifications.

3.  No evidence of left axillary lymphadenopathy.

4.  No evidence of malignancy in the right breast.

RECOMMENDATION:
Ultrasound guided biopsy is recommended for the 2 masses in the left
breast. The procedure has been scheduled for [DATE] at [DATE]
p.m.

I have discussed the findings and recommendations with the patient.
If applicable, a reminder letter will be sent to the patient
regarding the next appointment.

BI-RADS CATEGORY  5: Highly suggestive of malignancy.

## 2019-01-27 IMAGING — MG MM DIGITAL DIAGNOSTIC BILAT W/ TOMO W/ CAD
8 of 14 series · 8 of 38 positions shown · non-contrast
Comparison: Previous exam(s).

CLINICAL DATA: 78-year-old female presenting for evaluation of a
palpable lump in the left breast which she initially found in [REDACTED].
She does have family history of breast cancer in her sister who was
diagnosed at age 72.

EXAM:
DIGITAL DIAGNOSTIC BILATERAL MAMMOGRAM WITH CAD AND TOMO
ULTRASOUND LEFT BREAST

[L CC]
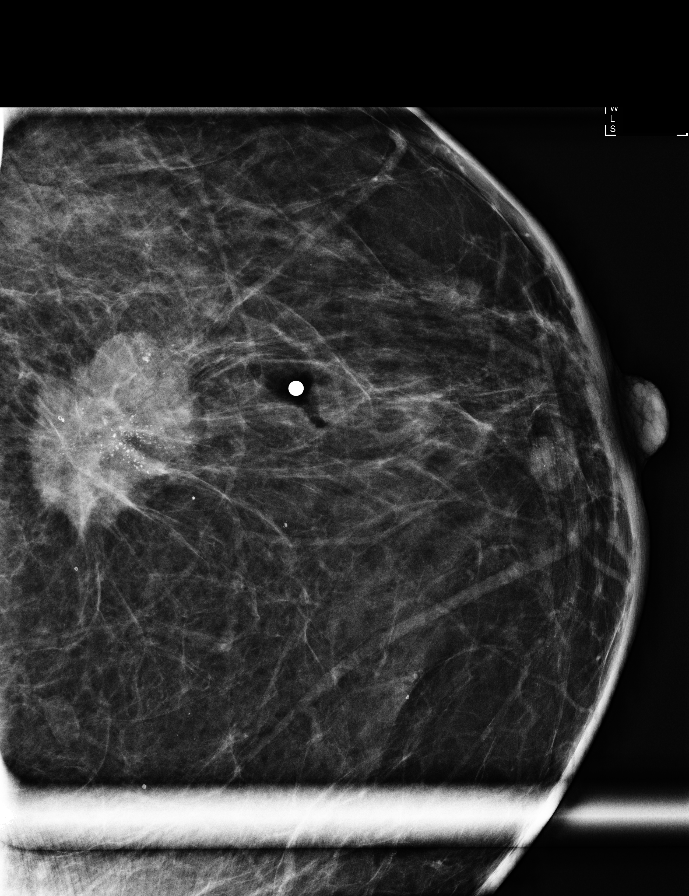

[L ML]
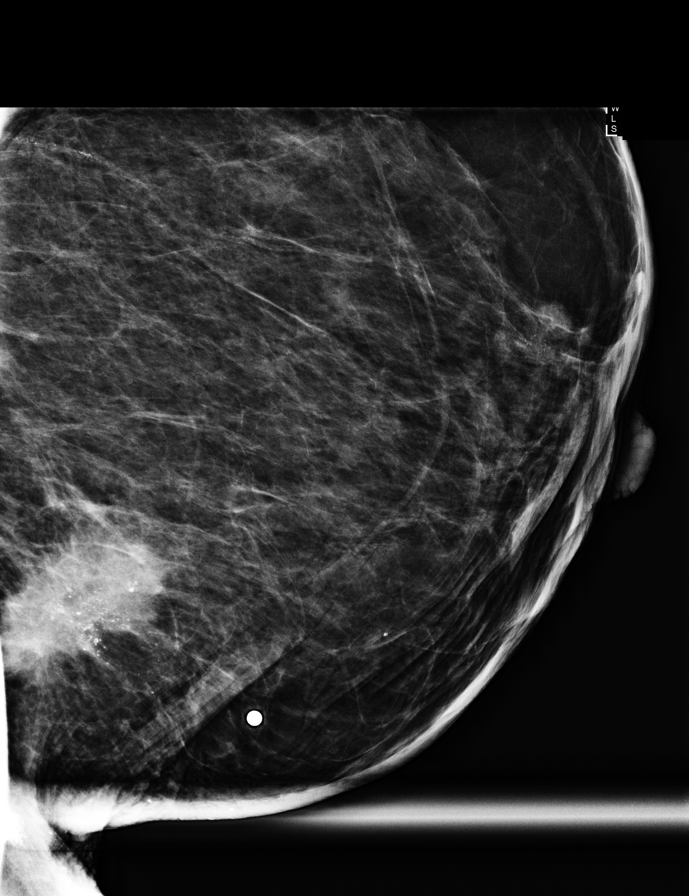

[L ML synth-2D]
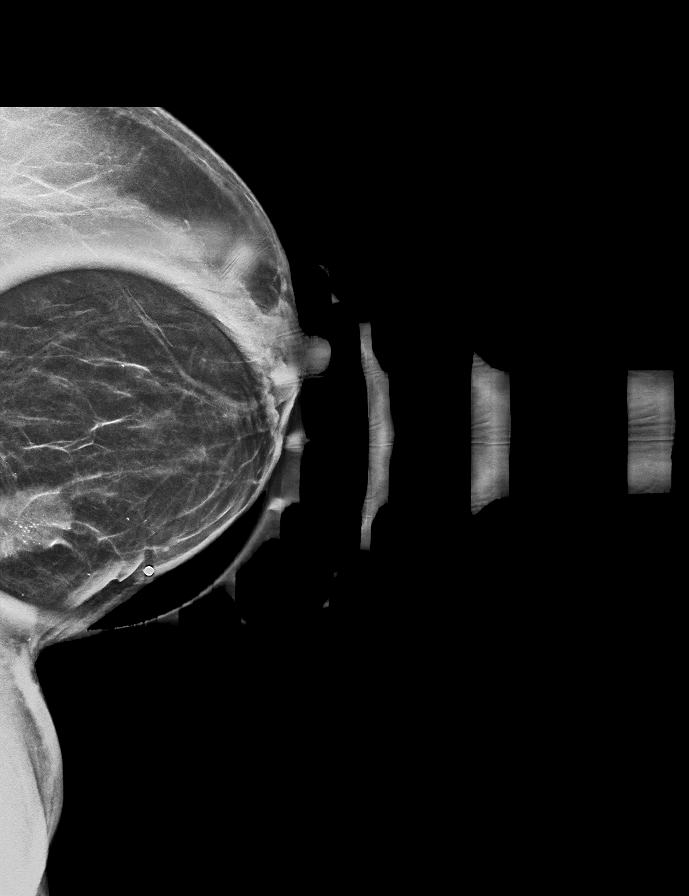

[L CC synth-2D]
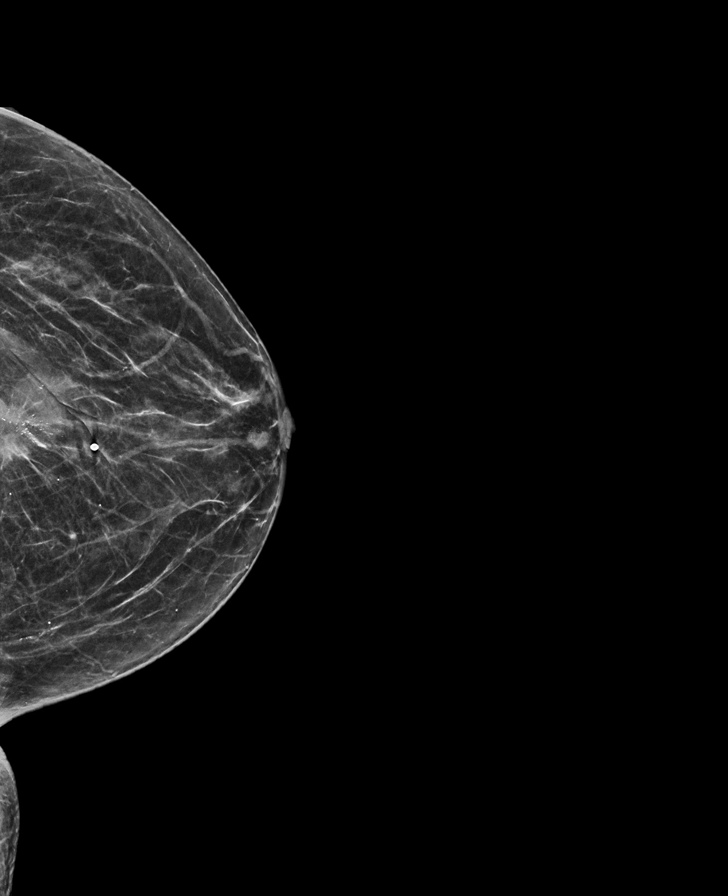

[L MLO synth-2D (1 of 2)]
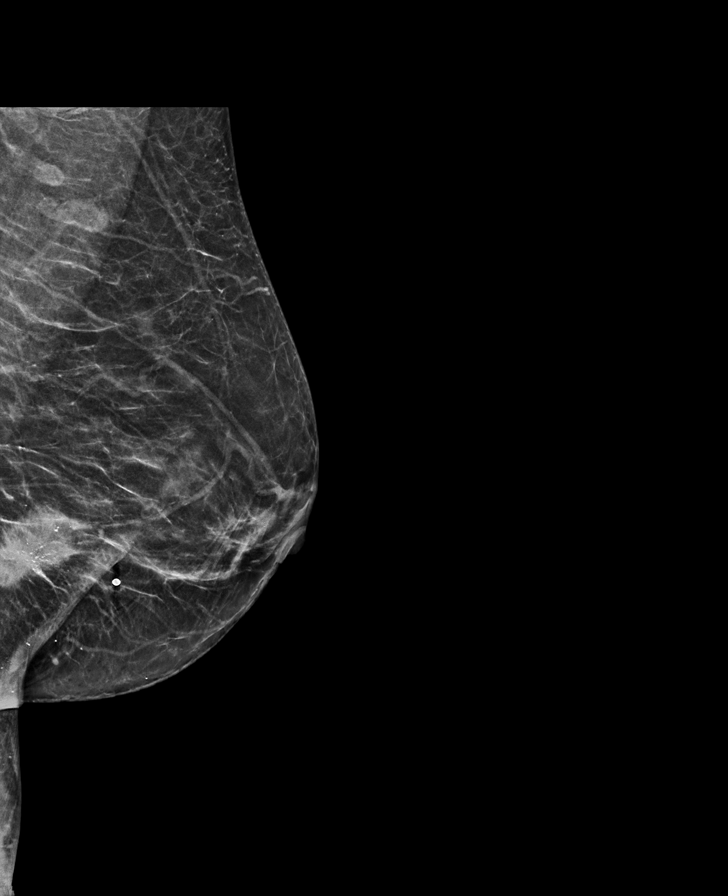

[R MLO synth-2D]
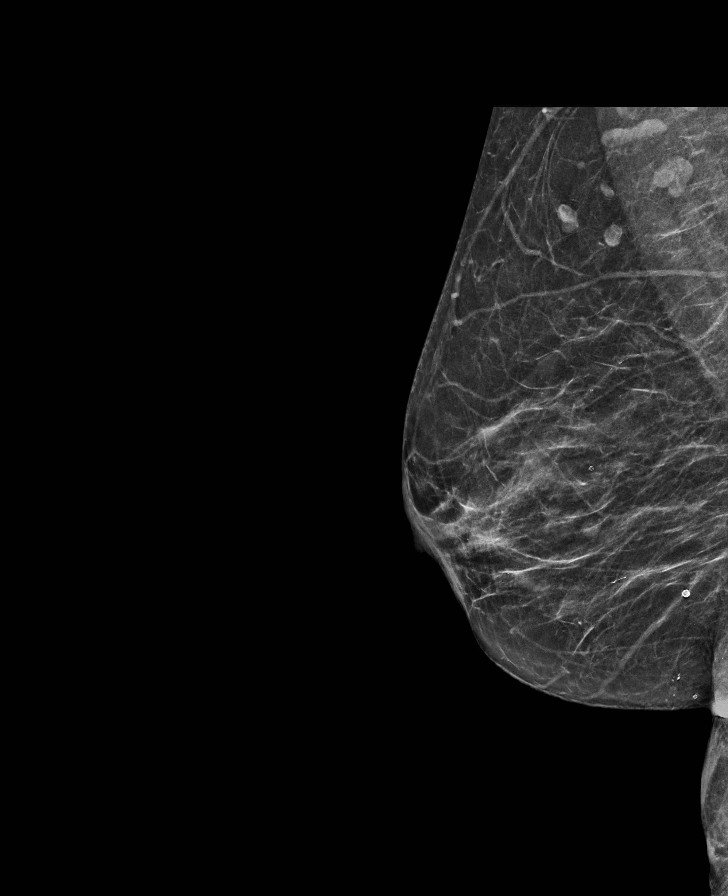

[R CC synth-2D]
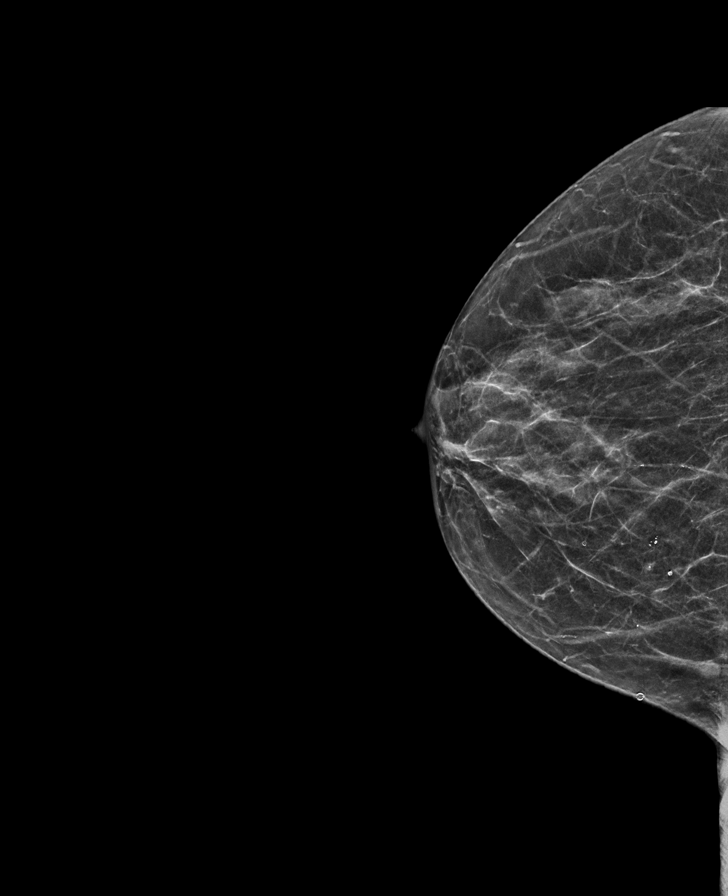

[L MLO synth-2D (2 of 2)]
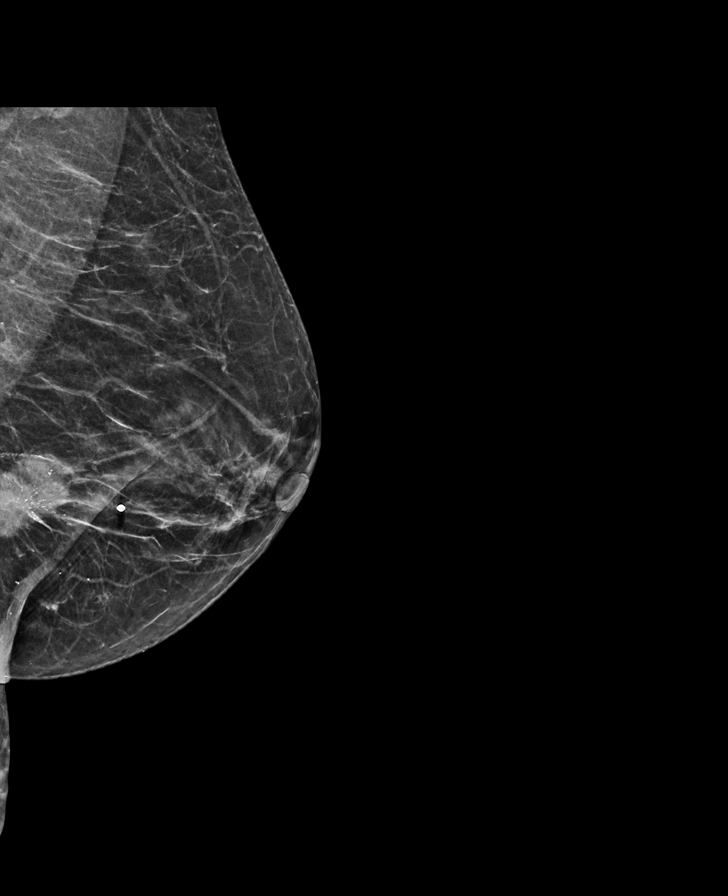

[8 of 38 positions shown; findings below may reference images not displayed]

ACR Breast Density Category b: There are scattered areas of
fibroglandular density.
FINDINGS: In the central slightly inferior far posterior left breast, there is
a partially visualized irregular mass with spiculated margins and
associated suspicious pleomorphic calcifications. Mass measures at
least 2.9 cm. Magnified images show that the calcifications are
largely within the mass, with a few trailing about one centimeter
inferior to the mass. In the retroareolar left breast, there is a
smaller mass measuring approximately 9 mm with associated internal
pleomorphic calcifications. No other suspicious calcifications,
masses or areas of distortion are seen in the bilateral breasts.

Mammographic images were processed with CAD.

Ultrasound of the left breast at 6 o'clock, 4 cm from the nipple
demonstrates an irregular hypoechoic mass with indistinct margins
measuring approximately 2.5 x 1.6 x 2.3 cm. Flow seen within the
mass on color Doppler imaging. In the retroareolar left breast at 12
o'clock, there is an irregular hypoechoic mass angular margins
measuring 0.7 x 0.5 x 0.7 cm. Ultrasound of the left axilla
demonstrates normal-appearing lymph nodes.
IMPRESSION: 1. There is a highly suspicious mass with calcifications in the
inferior left breast at the palpable site of concern.

2. There is an indeterminate retroareolar left breast mass with
calcifications.

3.  No evidence of left axillary lymphadenopathy.

4.  No evidence of malignancy in the right breast.

RECOMMENDATION:
Ultrasound guided biopsy is recommended for the 2 masses in the left
breast. The procedure has been scheduled for [DATE] at [DATE]
p.m.

I have discussed the findings and recommendations with the patient.
If applicable, a reminder letter will be sent to the patient
regarding the next appointment.

BI-RADS CATEGORY  5: Highly suggestive of malignancy.

## 2019-01-31 ENCOUNTER — Ambulatory Visit
Admission: RE | Admit: 2019-01-31 | Discharge: 2019-01-31 | Disposition: A | Discharge status: Home / Self Care | Payer: Medicare Other | Source: Ambulatory Visit | Attending: Family | Admitting: Family

## 2019-01-31 ENCOUNTER — Other Ambulatory Visit: Payer: Self-pay

## 2019-01-31 ENCOUNTER — Other Ambulatory Visit: Payer: Self-pay | Admitting: Family Medicine

## 2019-01-31 DIAGNOSIS — N6325 Unspecified lump in the left breast, overlapping quadrants: Secondary | ICD-10-CM | POA: Diagnosis not present

## 2019-01-31 DIAGNOSIS — N6321 Unspecified lump in the left breast, upper outer quadrant: Secondary | ICD-10-CM | POA: Diagnosis not present

## 2019-01-31 DIAGNOSIS — N6320 Unspecified lump in the left breast, unspecified quadrant: Secondary | ICD-10-CM

## 2019-01-31 DIAGNOSIS — N6323 Unspecified lump in the left breast, lower outer quadrant: Secondary | ICD-10-CM | POA: Diagnosis not present

## 2019-01-31 DIAGNOSIS — N6322 Unspecified lump in the left breast, upper inner quadrant: Secondary | ICD-10-CM | POA: Diagnosis not present

## 2019-01-31 DIAGNOSIS — C50812 Malignant neoplasm of overlapping sites of left female breast: Secondary | ICD-10-CM | POA: Diagnosis not present

## 2019-01-31 DIAGNOSIS — N6324 Unspecified lump in the left breast, lower inner quadrant: Secondary | ICD-10-CM | POA: Diagnosis not present

## 2019-01-31 HISTORY — PX: BREAST BIOPSY: SHX20

## 2019-01-31 IMAGING — US US BREAST BX W LOC DEV 1ST LESION IMG BX SPEC US GUIDE*L*
1 series · 11 of 11 positions shown · non-contrast
Comparison: Previous exam(s).
COMPARISON: Previous exam(s).

Addendum:
CLINICAL DATA: Patient for ultrasound-guided core needle biopsy
left breast mass 6 o'clock position and 12 o'clock position.

EXAM:
ULTRASOUND GUIDED LEFT BREAST CORE NEEDLE BIOPSY

[Series 1: us breast bx w loc dev 1st lesion img bx spec us g · 0.06mm/px · 11 of 11 slices shown]
[im 1/11]
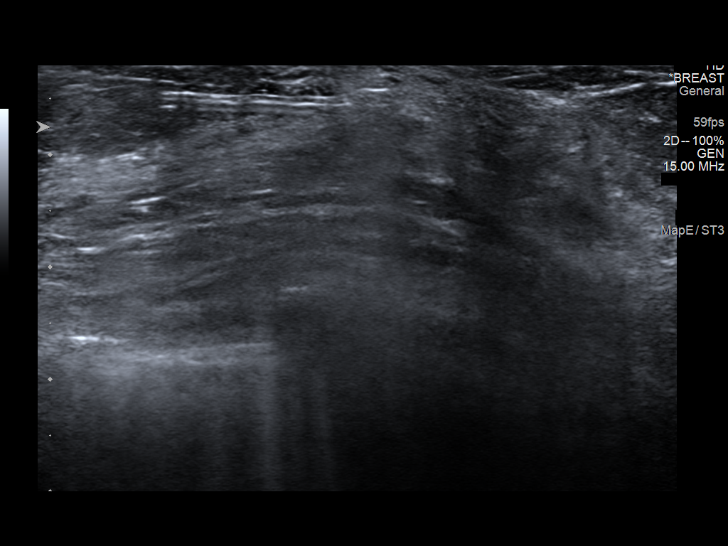
[im 2/11]
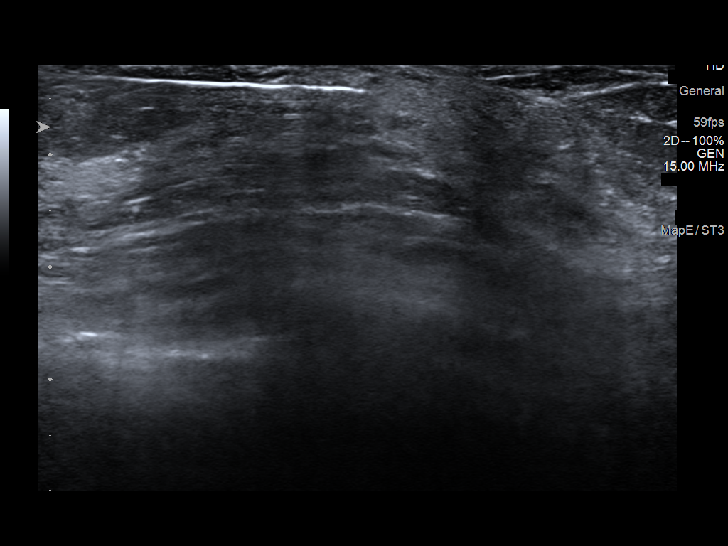
[im 3/11]
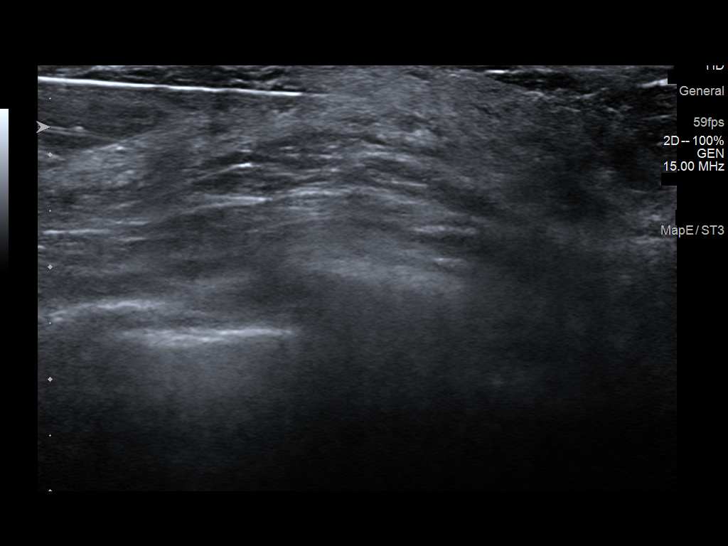
[im 4/11]
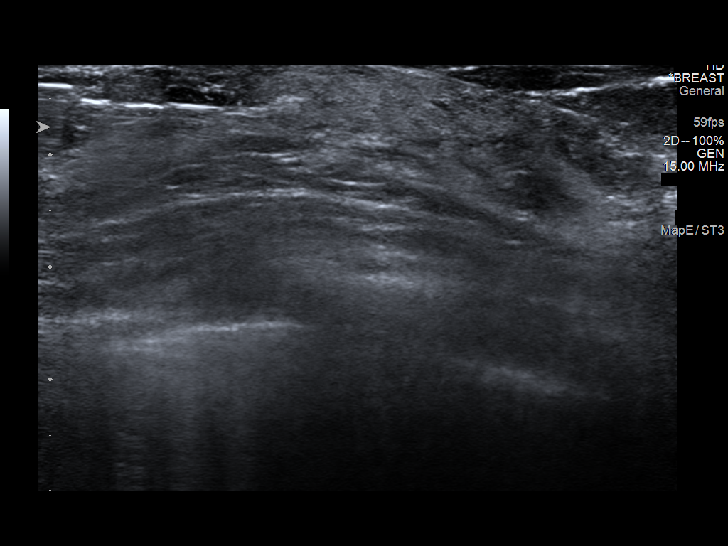
[im 5/11]
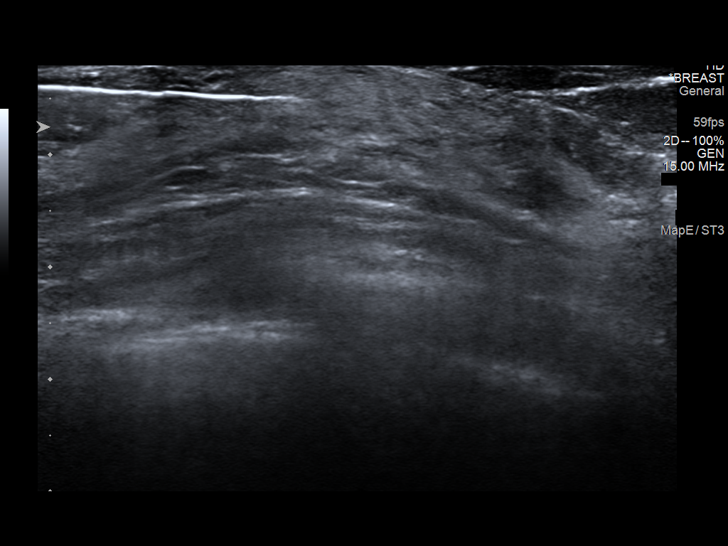
[im 6/11]
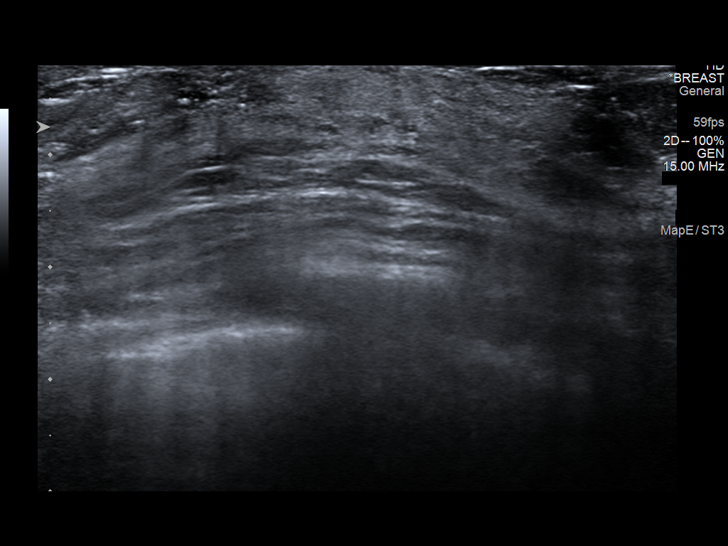
[im 7/11]
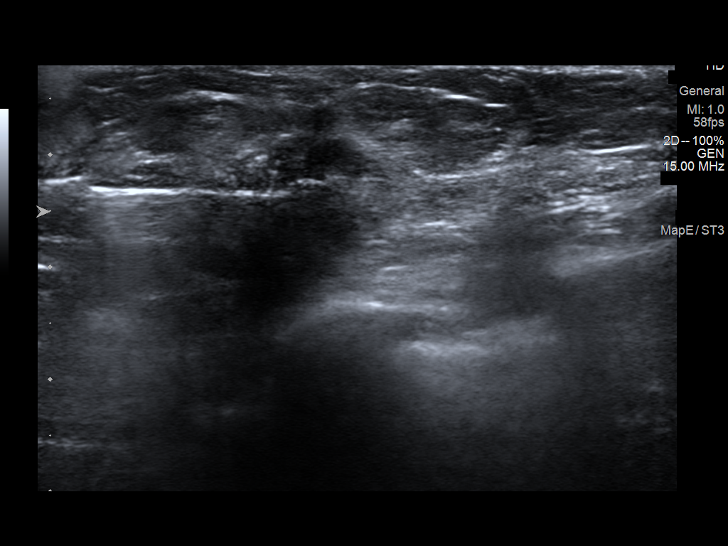
[im 8/11]
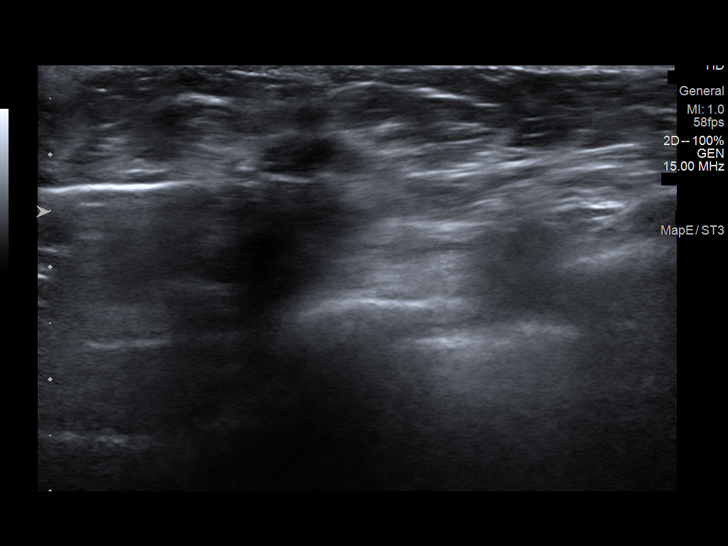
[im 9/11]
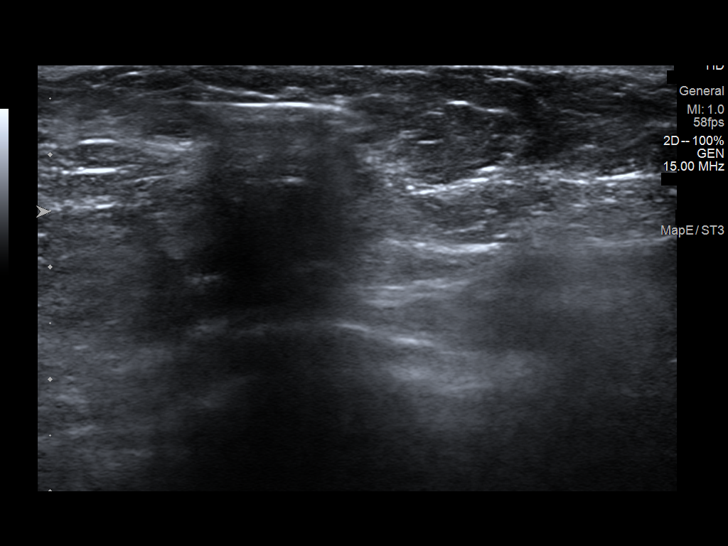
[im 10/11]
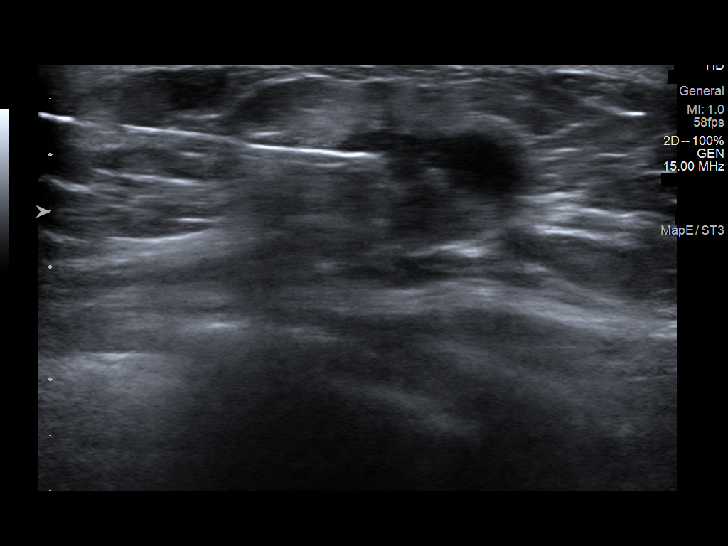
[im 11/11]
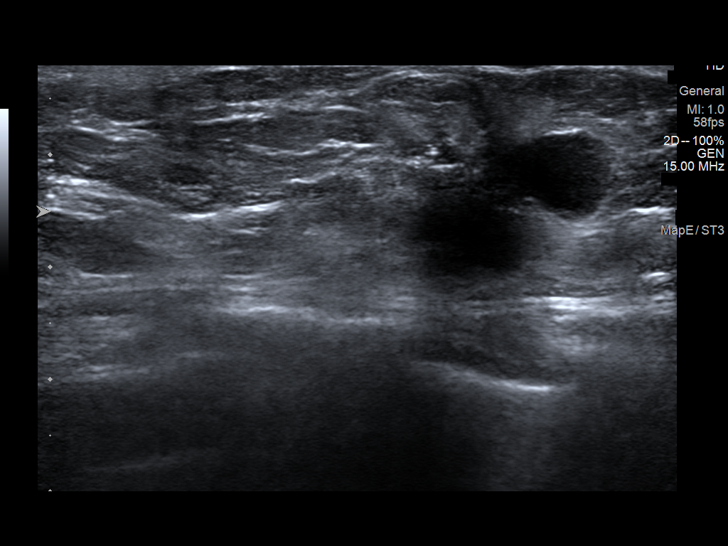

[11 of 11 positions shown; findings below may reference images not displayed]



Site 1: Left breast mass 12 o'clock position: Ribbon shaped clip

Lesion quadrant: Upper outer quadrant

Using sterile technique and 1% Lidocaine as local anesthetic, under
direct ultrasound visualization, a 14 gauge NYA device was
used to perform biopsy of left breast mass 12 o'clock position using
a medial approach. At the conclusion of the procedure ribbon shaped
tissue marker clip was deployed into the biopsy cavity. Follow up 2
view mammogram was performed and dictated separately.

Site 2: Left breast mass 6 o'clock position: Coil shaped clip.

Lesion quadrant: Lower inner quadrant

Using sterile technique and 1% Lidocaine as local anesthetic, under
direct ultrasound visualization, a 14 gauge NYA device was
used to perform biopsy of left breast mass 6 o'clock position using
a medial approach. At the conclusion of the procedure coil shaped
tissue marker clip was deployed into the biopsy cavity. Follow up 2
view mammogram was performed and dictated separately.
IMPRESSION: Ultrasound guided biopsy of left breast mass 6 o'clock position and
12 o'clock position as above. No apparent complications.

ADDENDUM:
Pathology revealed MAMMARY CARCINOMA of the LEFT breast, 12 o'clock.
This was found to be concordant by Dr. NYA.

Pathology revealed GRADE II INVASIVE DUCTAL CARCINOMA of the LEFT
breast, 6 o'clock. This was found to be concordant by Dr. NYA
NYA.

Pathology results were discussed with the patient by telephone. The
patient reported doing well after the biopsies with tenderness at
the sites. Post biopsy instructions and care were reviewed and
questions were answered. The patient was encouraged to call The

Surgical consultation has been arranged with Dr. NYA at
[REDACTED] on [DATE].

Pathology results reported by NYA, RN on [DATE].



Site 1: Left breast mass 12 o'clock position: Ribbon shaped clip

Lesion quadrant: Upper outer quadrant

Using sterile technique and 1% Lidocaine as local anesthetic, under
direct ultrasound visualization, a 14 gauge NYA device was
used to perform biopsy of left breast mass 12 o'clock position using
a medial approach. At the conclusion of the procedure ribbon shaped
tissue marker clip was deployed into the biopsy cavity. Follow up 2
view mammogram was performed and dictated separately.

Site 2: Left breast mass 6 o'clock position: Coil shaped clip.

Lesion quadrant: Lower inner quadrant

Using sterile technique and 1% Lidocaine as local anesthetic, under
direct ultrasound visualization, a 14 gauge NYA device was
used to perform biopsy of left breast mass 6 o'clock position using
a medial approach. At the conclusion of the procedure coil shaped
tissue marker clip was deployed into the biopsy cavity. Follow up 2
view mammogram was performed and dictated separately.
IMPRESSION: Ultrasound guided biopsy of left breast mass 6 o'clock position and
12 o'clock position as above. No apparent complications.

## 2019-01-31 IMAGING — MG MM BREAST LOCALIZATION CLIP
4 series · 4 of 12 positions shown · non-contrast
Comparison: Previous exam(s).

CLINICAL DATA: Patient status post ultrasound-guided core needle
biopsy left breast masses.

EXAM:
DIAGNOSTIC LEFT MAMMOGRAM POST ULTRASOUND BIOPSY

[L CC synth-2D]
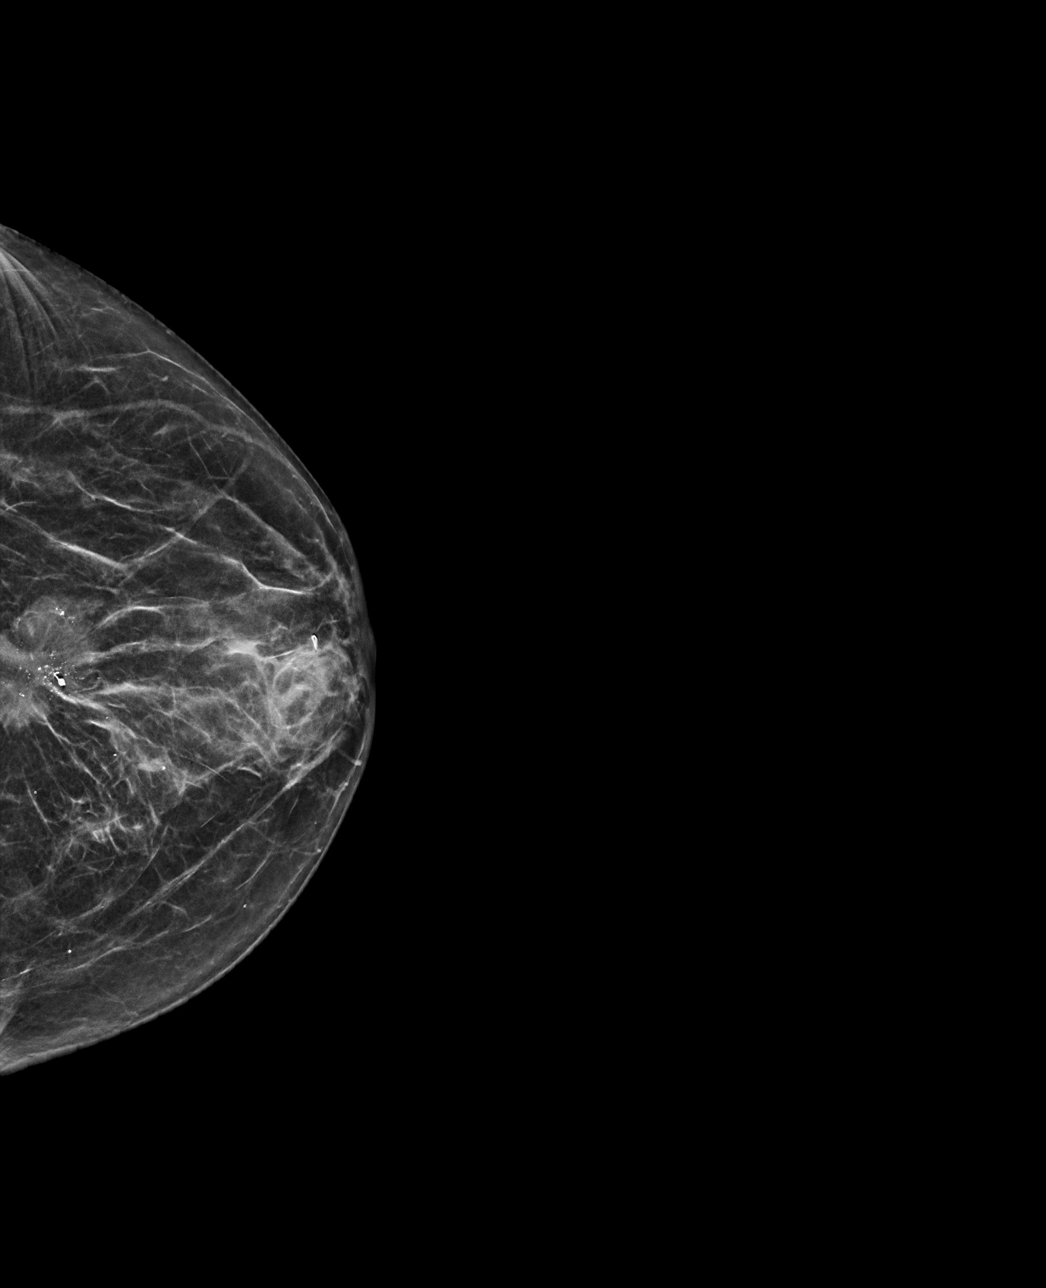

[L ML synth-2D]
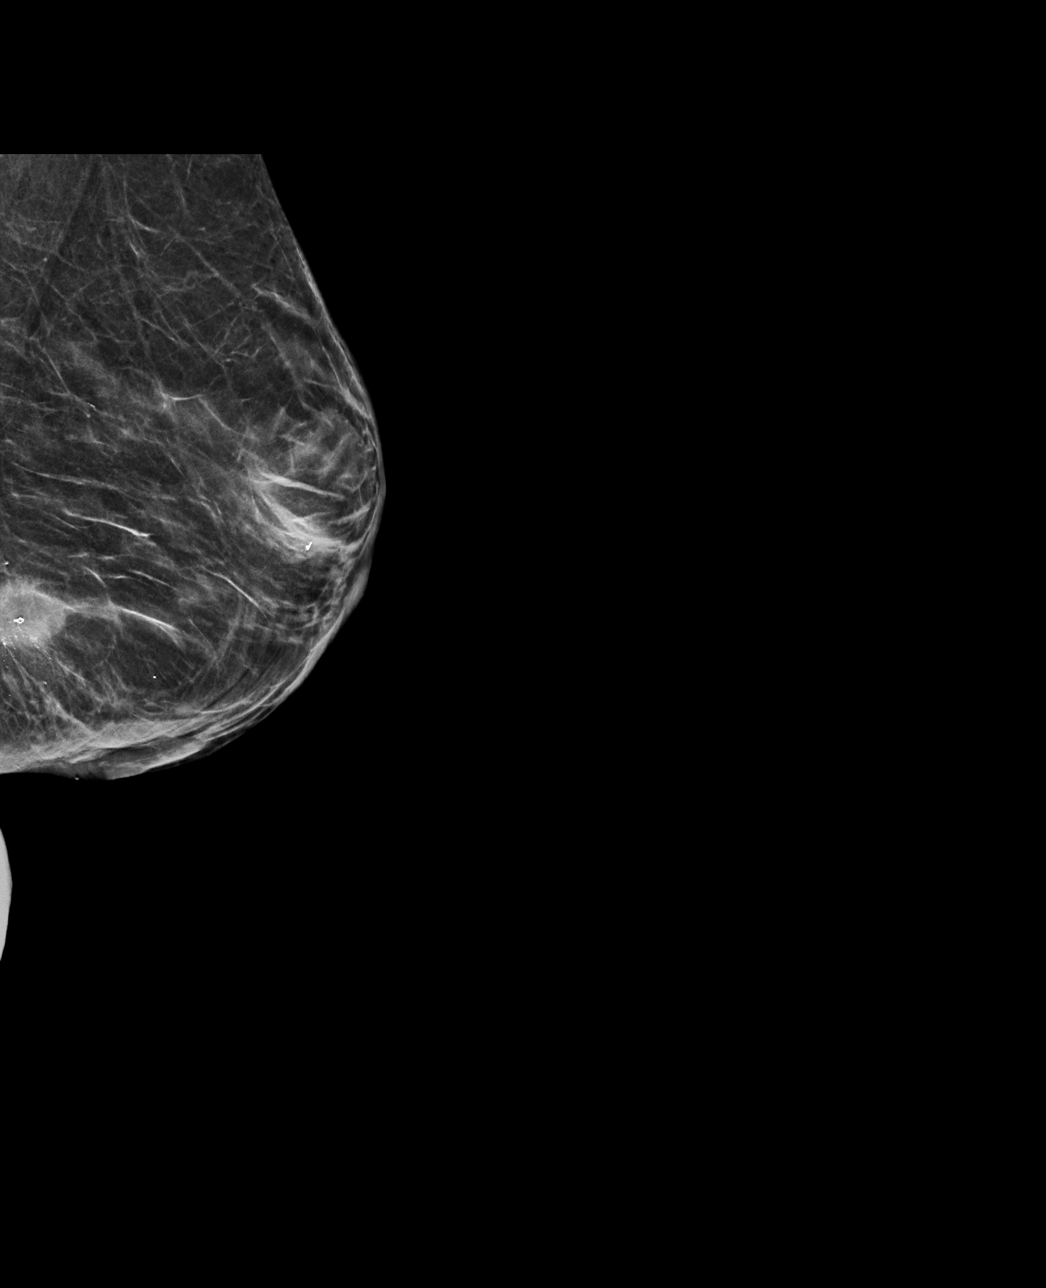

[L CC tomo · tomo slice 26/51.0]
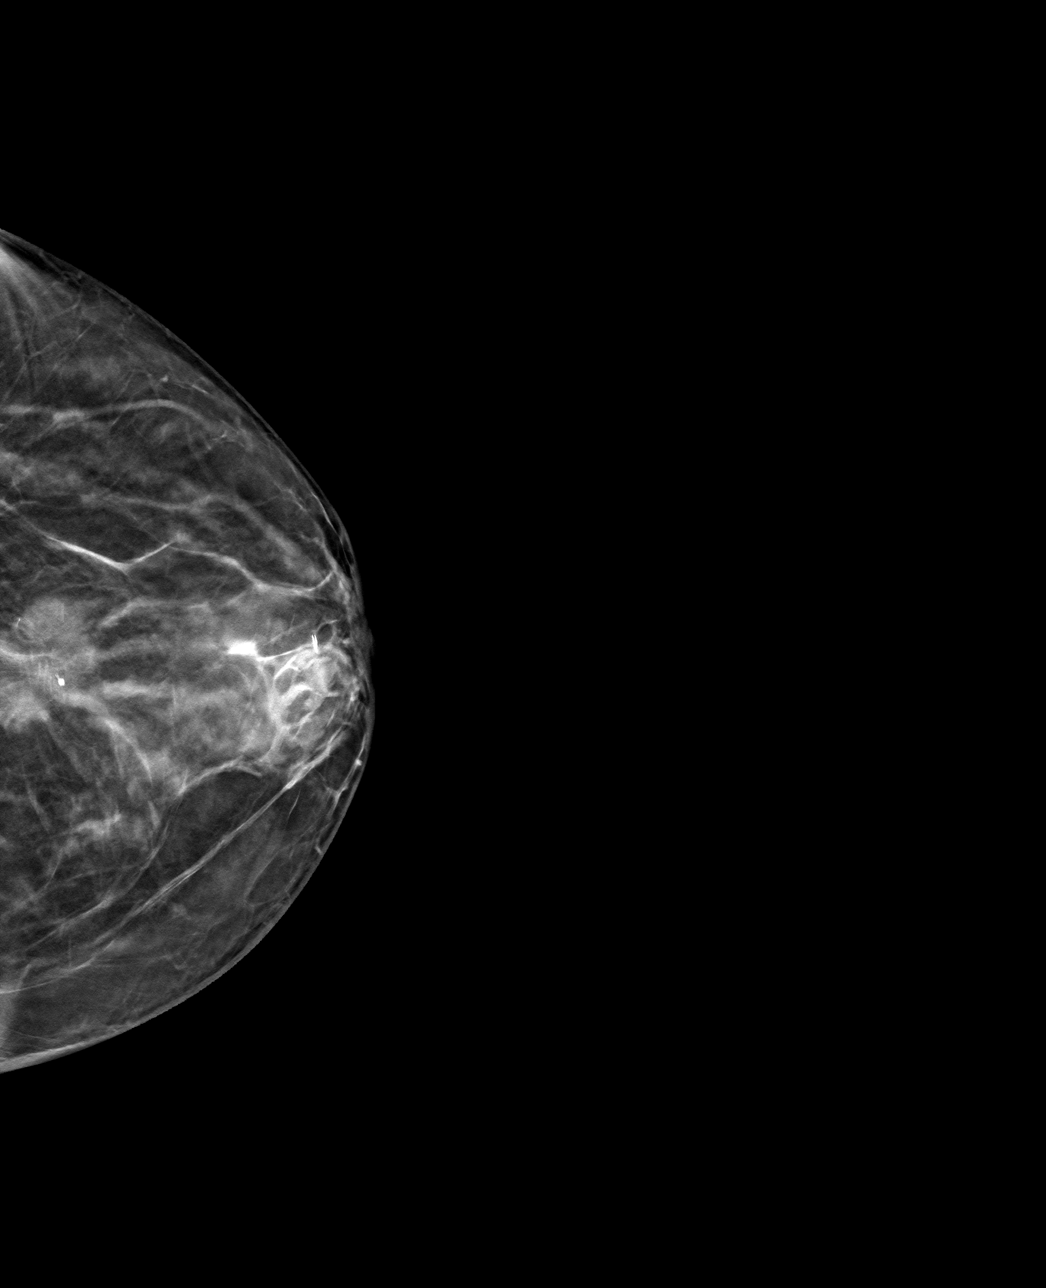

[L ML tomo · tomo slice 29/58.0]
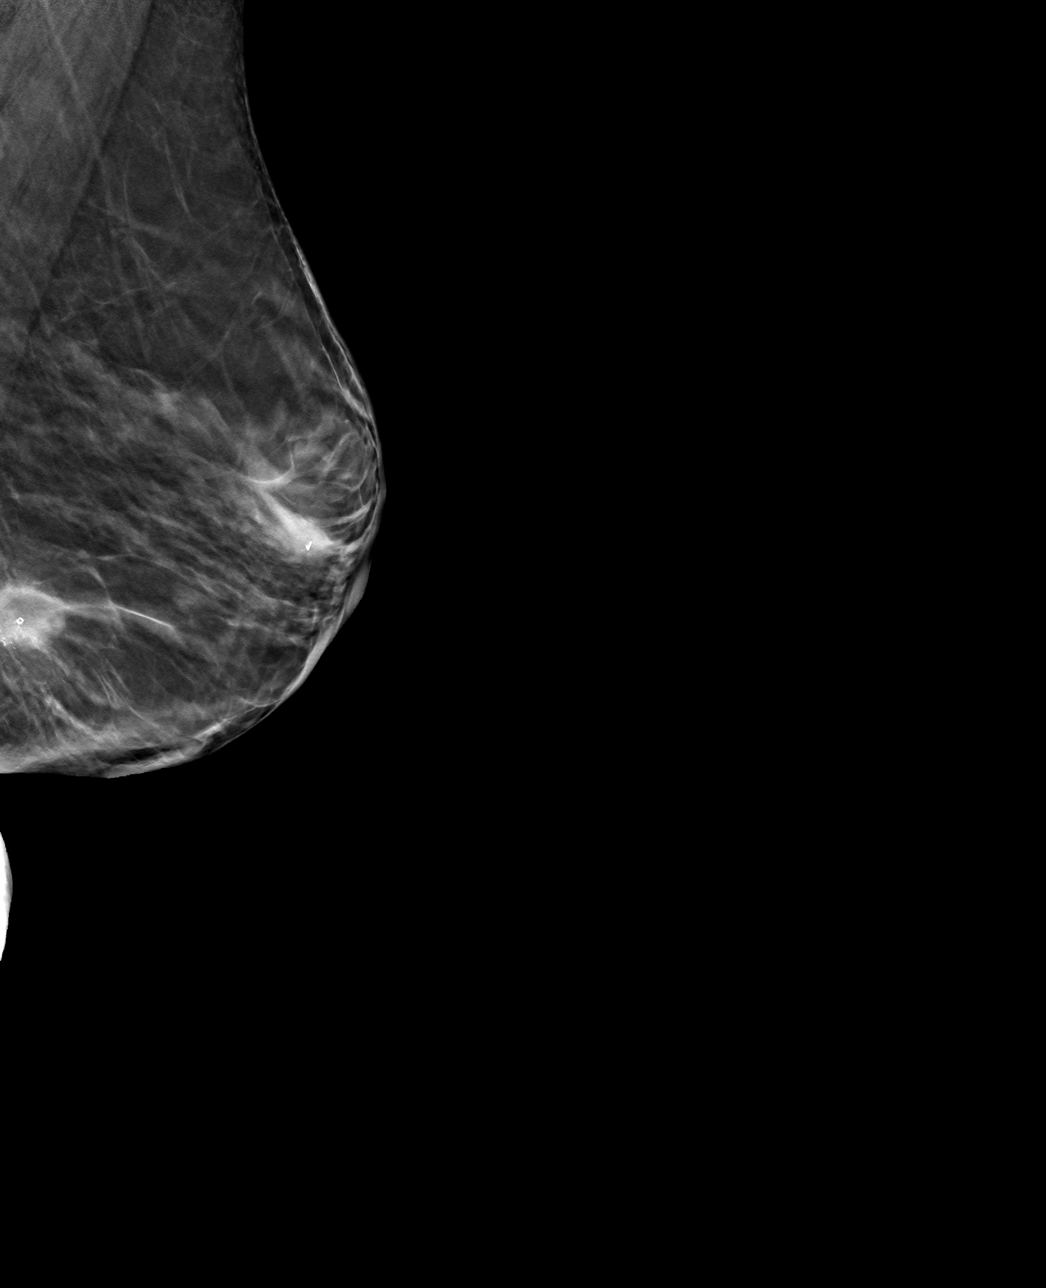

[4 of 12 positions shown; findings below may reference images not displayed]

FINDINGS: Mammographic images were obtained following ultrasound guided biopsy
of left breast masses 6 o'clock and 12 o'clock position.

Site 1: Left breast mass 12 o'clock position: Ribbon shaped marking
clip: In appropriate position.

Site 2: Left breast mass 6 o'clock position: Coil shaped clip: In
appropriate position.
IMPRESSION: Appropriate position biopsy marking clips as above status post 2
site biopsy left breast.

Final Assessment: Post Procedure Mammograms for Marker Placement

## 2019-02-10 DIAGNOSIS — C50912 Malignant neoplasm of unspecified site of left female breast: Secondary | ICD-10-CM | POA: Diagnosis not present

## 2019-02-11 ENCOUNTER — Other Ambulatory Visit: Payer: Self-pay | Admitting: Neurology

## 2019-02-13 NOTE — Progress Notes (Signed)
Location of Breast Cancer: Left Breast  Histology per Pathology Report:  01/31/19 Diagnosis 1. Breast, left, needle core biopsy, 12 o'clock - MAMMARY CARCINOMA. - SEE MICROSCOPIC DESCRIPTION. 2. Breast, left, needle core biopsy, 6 o'clock - INVASIVE DUCTAL CARCINOMA, GRADE II. - DUCTAL CARCINOMA IN SITU.  Receptor Status: ER(100%), PR (100%), Her2-neu (), Ki-(15%)  Did patient present with symptoms or was this found on screening mammography?: She self palpated the mass in June 2020.   Past/Anticipated interventions by surgeon, if any: Dr. Brantley Stage 02/10/19   Past/Anticipated interventions by medical oncology, if any:  02/17/19 Dr. Lindi Adie  Lymphedema issues, if any:  N/A  Pain issues, if any:  She denies.   SAFETY ISSUES:  Prior radiation? No  Pacemaker/ICD? No  Possible current pregnancy? No  Is the patient on methotrexate? No  Current Complaints / other details:   02/21/19 MRI Bilateral Breasts    Takao Lizer, Stephani Police, RN 02/13/2019,11:31 AM

## 2019-02-14 ENCOUNTER — Encounter: Payer: Self-pay | Admitting: Radiation Oncology

## 2019-02-14 ENCOUNTER — Other Ambulatory Visit: Payer: Self-pay

## 2019-02-14 ENCOUNTER — Ambulatory Visit
Admission: RE | Admit: 2019-02-14 | Discharge: 2019-02-14 | Disposition: A | Discharge status: Home / Self Care | Payer: Medicare Other | Source: Ambulatory Visit | Attending: Radiation Oncology | Admitting: Radiation Oncology

## 2019-02-14 ENCOUNTER — Other Ambulatory Visit: Payer: Self-pay | Admitting: Surgery

## 2019-02-14 ENCOUNTER — Telehealth: Payer: Self-pay | Admitting: Hematology and Oncology

## 2019-02-14 DIAGNOSIS — C50812 Malignant neoplasm of overlapping sites of left female breast: Secondary | ICD-10-CM

## 2019-02-14 DIAGNOSIS — Z170 Estrogen receptor positive status [ER+]: Secondary | ICD-10-CM

## 2019-02-14 DIAGNOSIS — C50512 Malignant neoplasm of lower-outer quadrant of left female breast: Secondary | ICD-10-CM | POA: Diagnosis not present

## 2019-02-14 DIAGNOSIS — C50912 Malignant neoplasm of unspecified site of left female breast: Secondary | ICD-10-CM

## 2019-02-14 NOTE — Telephone Encounter (Signed)
Received a new patient referral from Dr. Brantley Stage for a new dx of breast cancer. Robin Anderson has been cld and scheduled to see Dr. Lindi Adie on 10/30 at 1:30pm. She's been made aware to arrive 15 minutes early.

## 2019-02-14 NOTE — Progress Notes (Signed)
Radiation Oncology         [phone] ________________________________  Initial outpatient Consultation by telephone as patient was unable to access video conferencing during pandemic precautions   Name: Selby Slovacek MRN: [id number]  Date: 02/14/2019  DOB: 01-15-1941  XT:KWIO, Terrilee Files, FNP  Erroll Luna, MD   REFERRING PHYSICIAN: Erroll Luna, MD  DIAGNOSIS:    ICD-10-CM   1. Carcinoma of overlapping sites of left breast in female, estrogen receptor positive ([place])  C50.812    Z17.0   2. Malignant neoplasm of lower-outer quadrant of left breast of female, estrogen receptor positive ([place])  C50.512    Z17.0   Cancer Staging Malignant neoplasm of lower-outer quadrant of left breast of female, estrogen receptor positive ([place]) Staging form: Breast, AJCC 8th Edition - Clinical stage from 02/17/2019: Stage IB (cT2, cN0, cM0, G2, ER+, PR+, HER2-) - Signed by Nicholas Lose, MD on 02/17/2019   CHIEF COMPLAINT: Here to discuss management of left breast cancer  HISTORY OF PRESENT ILLNESS::Tenee Zakiyyah [place] is a 78 y.o. female who presented with breast abnormality on the following imaging: diagnostic mammogram on the date of 01/27/2019.  Symptoms, if any, at that time, were: palpable left breast lump since 10/2018.   Ultrasound of breast on 01/27/2019 revealed: suspicious 2.5 cm mass with calcifications in the left breast at 6 o'clock; indeterminate 0.7 cm mass with calcifications in the left breast at 12 o'clock; no left axillary lymphadenopathy or right breast malignancy.   Biopsy on date of 01/31/2019 showed: at 12 o'clock, mammary carcinoma, e-cadherin positive (ductal); at 6 o'clock, invasive ductal carcinoma, with DCIS.  ER status: positive; PR status positive, Her2 status negative by FISH; Grade 2.  On 02/10/2019, she met with Dr. Brantley Stage, who recommended mastectomy or neoadjuvant chemotherapy or "the pill" followed by two lumpectomies. Per his note, she is motivated to  conserve her breast with lumpectomies. He also ordered breast MRI.  She is not currently scheduled with a medical oncologist or MRI.    PREVIOUS RADIATION THERAPY: No  PAST MEDICAL HISTORY:  has a past medical history of Blood in stool, Chest pain, unspecified, Headache (07/02/2014), Heart murmur, Hepatitis, History of colon polyps, HLD (hyperlipidemia), HTN (hypertension), Prediabetes (07/28/2011), and Seizure disorder ([place]).    PAST SURGICAL HISTORY: Past Surgical History:  Procedure Laterality Date   ABDOMINAL HERNIA REPAIR     lower mid line hernia which has reherniatied   BUNIONECTOMY Right    traumatic injury to her foot that was repaired   TONSILLECTOMY     TUBAL LIGATION      FAMILY HISTORY: family history includes Breast cancer in her sister; Heart attack in her daughter and father; Hypertension in her brother, mother, and sister; Rectal cancer in her sister.  SOCIAL HISTORY:  reports that she has quit smoking. Her smoking use included cigarettes. She has never used smokeless tobacco. She reports that she does not drink alcohol or use drugs.  ALLERGIES: Penicillins  MEDICATIONS:  Current Outpatient Medications  Medication Sig Dispense Refill   aspirin EC 81 MG tablet Take 1 tablet (81 mg total) by mouth daily.     benazepril-hydrochlorthiazide (LOTENSIN HCT) 20-12.5 MG tablet TAKE 1 TABLET BY MOUTH EVERY DAY 30 tablet 11   Multiple Vitamins-Minerals (ICAPS MV PO) Take by mouth daily.     primidone (MYSOLINE) 50 MG tablet TAKE 3 TABLETS BY MOUTH AT BEDTIME 270 tablet 1   simvastatin (ZOCOR) 20 MG tablet TAKE 1 TABLET BY MOUTH EVERYDAY AT BEDTIME  90 tablet 3   anastrozole (ARIMIDEX) 1 MG tablet Take 1 tablet (1 mg total) by mouth daily. 90 tablet 3   No current facility-administered medications for this encounter.     REVIEW OF SYSTEMS: A 10+ POINT REVIEW OF SYSTEMS WAS OBTAINED including neurology, dermatology, psychiatry, cardiac, respiratory, lymph,  extremities, GI, GU, Musculoskeletal, constitutional, breasts, reproductive, HEENT.  All pertinent positives are noted in the HPI.  All others are negative.   PHYSICAL EXAM:  vitals were not taken for this visit.   General: Alert and oriented, in no acute distress     LABORATORY DATA:  Lab Results  Component Value Date   WBC 5.7 01/11/2019   HGB 12.0 01/11/2019   HCT 38.8 01/11/2019   MCV 88 01/11/2019   PLT 163 01/11/2019   CMP     Component Value Date/Time   NA 141 01/11/2019 0943   K 3.6 01/11/2019 0943   CL 102 01/11/2019 0943   CO2 25 01/11/2019 0943   GLUCOSE 88 01/11/2019 0943   GLUCOSE 78 09/11/2015 0944   BUN 15 01/11/2019 0943   CREATININE 1.13 (H) 01/11/2019 0943   CREATININE 0.95 (H) 09/11/2015 0944   CALCIUM 9.2 01/11/2019 0943   PROT 7.1 01/11/2019 0943   ALBUMIN 4.3 01/11/2019 0943   AST 14 01/11/2019 0943   ALT 10 01/11/2019 0943   ALKPHOS 102 01/11/2019 0943   BILITOT 0.2 01/11/2019 0943   GFRNONAA 47 (L) 01/11/2019 0943   GFRAA 55 (L) 01/11/2019 0943         RADIOGRAPHY: US Breast Ltd Uni Left Inc Axilla  Result Date: 01/27/2019 CLINICAL DATA:  78 year old female presenting for evaluation of a palpable lump in the left breast which she initially found in July. She does have family history of breast cancer in her sister who was diagnosed at age 13. EXAM: DIGITAL DIAGNOSTIC BILATERAL MAMMOGRAM WITH CAD AND TOMO ULTRASOUND LEFT BREAST COMPARISON:  Previous exam(s). ACR Breast Density Category b: There are scattered areas of fibroglandular density. FINDINGS: In the central slightly inferior far posterior left breast, there is a partially visualized irregular mass with spiculated margins and associated suspicious pleomorphic calcifications. Mass measures at least 2.9 cm. Magnified images show that the calcifications are largely within the mass, with a few trailing about one centimeter inferior to the mass. In the retroareolar left breast, there is a smaller mass  measuring approximately 9 mm with associated internal pleomorphic calcifications. No other suspicious calcifications, masses or areas of distortion are seen in the bilateral breasts. Mammographic images were processed with CAD. Ultrasound of the left breast at 6 o'clock, 4 cm from the nipple demonstrates an irregular hypoechoic mass with indistinct margins measuring approximately 2.5 x 1.6 x 2.3 cm. Flow seen within the mass on color Doppler imaging. In the retroareolar left breast at 12 o'clock, there is an irregular hypoechoic mass angular margins measuring 0.7 x 0.5 x 0.7 cm. Ultrasound of the left axilla demonstrates normal-appearing lymph nodes. IMPRESSION: 1. There is a highly suspicious mass with calcifications in the inferior left breast at the palpable site of concern. 2. There is an indeterminate retroareolar left breast mass with calcifications. 3.  No evidence of left axillary lymphadenopathy. 4.  No evidence of malignancy in the right breast. RECOMMENDATION: Ultrasound guided biopsy is recommended for the 2 masses in the left breast. The procedure has been scheduled for 01/31/2019 at 1:45 p.m. I have discussed the findings and recommendations with the patient. If applicable, a reminder letter will be  sent to the patient regarding the next appointment. BI-RADS CATEGORY  5: Highly suggestive of malignancy. Electronically Signed   By: Ammie Ferrier M.D.   On: 01/27/2019 13:02   Mm Diag Breast Tomo Bilateral  Result Date: 01/27/2019 CLINICAL DATA:  78 year old female presenting for evaluation of a palpable lump in the left breast which she initially found in July. She does have family history of breast cancer in her sister who was diagnosed at age 37. EXAM: DIGITAL DIAGNOSTIC BILATERAL MAMMOGRAM WITH CAD AND TOMO ULTRASOUND LEFT BREAST COMPARISON:  Previous exam(s). ACR Breast Density Category b: There are scattered areas of fibroglandular density. FINDINGS: In the central slightly inferior far  posterior left breast, there is a partially visualized irregular mass with spiculated margins and associated suspicious pleomorphic calcifications. Mass measures at least 2.9 cm. Magnified images show that the calcifications are largely within the mass, with a few trailing about one centimeter inferior to the mass. In the retroareolar left breast, there is a smaller mass measuring approximately 9 mm with associated internal pleomorphic calcifications. No other suspicious calcifications, masses or areas of distortion are seen in the bilateral breasts. Mammographic images were processed with CAD. Ultrasound of the left breast at 6 o'clock, 4 cm from the nipple demonstrates an irregular hypoechoic mass with indistinct margins measuring approximately 2.5 x 1.6 x 2.3 cm. Flow seen within the mass on color Doppler imaging. In the retroareolar left breast at 12 o'clock, there is an irregular hypoechoic mass angular margins measuring 0.7 x 0.5 x 0.7 cm. Ultrasound of the left axilla demonstrates normal-appearing lymph nodes. IMPRESSION: 1. There is a highly suspicious mass with calcifications in the inferior left breast at the palpable site of concern. 2. There is an indeterminate retroareolar left breast mass with calcifications. 3.  No evidence of left axillary lymphadenopathy. 4.  No evidence of malignancy in the right breast. RECOMMENDATION: Ultrasound guided biopsy is recommended for the 2 masses in the left breast. The procedure has been scheduled for 01/31/2019 at 1:45 p.m. I have discussed the findings and recommendations with the patient. If applicable, a reminder letter will be sent to the patient regarding the next appointment. BI-RADS CATEGORY  5: Highly suggestive of malignancy. Electronically Signed   By: Ammie Ferrier M.D.   On: 01/27/2019 13:02   Mm Clip Placement Left  Result Date: 01/31/2019 CLINICAL DATA:  Patient status post ultrasound-guided core needle biopsy left breast masses. EXAM:  DIAGNOSTIC LEFT MAMMOGRAM POST ULTRASOUND BIOPSY COMPARISON:  Previous exam(s). FINDINGS: Mammographic images were obtained following ultrasound guided biopsy of left breast masses 6 o'clock and 12 o'clock position. Site 1: Left breast mass 12 o'clock position: Ribbon shaped marking clip: In appropriate position. Site 2: Left breast mass 6 o'clock position: Coil shaped clip: In appropriate position. IMPRESSION: Appropriate position biopsy marking clips as above status post 2 site biopsy left breast. Final Assessment: Post Procedure Mammograms for Marker Placement Electronically Signed   By: Lovey Newcomer M.D.   On: 01/31/2019 14:59   [place] Lt Breast Bx W Loc Dev 1st Lesion Img Bx Spec US Guide  Addendum Date: 02/01/2019   ADDENDUM REPORT: 02/01/2019 13:50 ADDENDUM: Pathology revealed MAMMARY CARCINOMA of the LEFT breast, 12 o'clock. This was found to be concordant by Dr. Lovey Newcomer. Pathology revealed GRADE II INVASIVE DUCTAL CARCINOMA of the LEFT breast, 6 o'clock. This was found to be concordant by Dr. Lovey Newcomer. Pathology results were discussed with the patient by telephone. The patient reported doing well after the biopsies  with tenderness at the sites. Post biopsy instructions and care were reviewed and questions were answered. The patient was encouraged to call [facility] for any additional concerns. Surgical consultation has been arranged with Dr. Erroll Luna at [facility] on February 10, 2019. Pathology results reported by Stacie Acres, RN on 02/01/2019. Electronically Signed   By: Lovey Newcomer M.D.   On: 02/01/2019 13:50   Result Date: 02/01/2019 CLINICAL DATA:  Patient for ultrasound-guided core needle biopsy left breast mass 6 o'clock position and 12 o'clock position. EXAM: ULTRASOUND GUIDED LEFT BREAST CORE NEEDLE BIOPSY COMPARISON:  Previous exam(s). FINDINGS: I met with the patient and we discussed the procedure of ultrasound-guided biopsy, including  benefits and alternatives. We discussed the high likelihood of a successful procedure. We discussed the risks of the procedure, including infection, bleeding, tissue injury, clip migration, and inadequate sampling. Informed written consent was given. The usual time-out protocol was performed immediately prior to the procedure. Site 1: Left breast mass 12 o'clock position: Ribbon shaped clip Lesion quadrant: Upper outer quadrant Using sterile technique and 1% Lidocaine as local anesthetic, under direct ultrasound visualization, a 14 gauge spring-loaded device was used to perform biopsy of left breast mass 12 o'clock position using a medial approach. At the conclusion of the procedure ribbon shaped tissue marker clip was deployed into the biopsy cavity. Follow up 2 view mammogram was performed and dictated separately. Site 2: Left breast mass 6 o'clock position: Coil shaped clip. Lesion quadrant: Lower inner quadrant Using sterile technique and 1% Lidocaine as local anesthetic, under direct ultrasound visualization, a 14 gauge spring-loaded device was used to perform biopsy of left breast mass 6 o'clock position using a medial approach. At the conclusion of the procedure coil shaped tissue marker clip was deployed into the biopsy cavity. Follow up 2 view mammogram was performed and dictated separately. IMPRESSION: Ultrasound guided biopsy of left breast mass 6 o'clock position and 12 o'clock position as above. No apparent complications. Electronically Signed: By: Lovey Newcomer M.D. On: 01/31/2019 14:57   [place] Lt Breast Bx W Loc Dev Ea Add Lesion Img Bx Spec US Guide  Addendum Date: 02/01/2019   ADDENDUM REPORT: 02/01/2019 13:50 ADDENDUM: Pathology revealed MAMMARY CARCINOMA of the LEFT breast, 12 o'clock. This was found to be concordant by Dr. Lovey Newcomer. Pathology revealed GRADE II INVASIVE DUCTAL CARCINOMA of the LEFT breast, 6 o'clock. This was found to be concordant by Dr. Lovey Newcomer. Pathology results were  discussed with the patient by telephone. The patient reported doing well after the biopsies with tenderness at the sites. Post biopsy instructions and care were reviewed and questions were answered. The patient was encouraged to call [facility] for any additional concerns. Surgical consultation has been arranged with Dr. Erroll Luna at [facility] on February 10, 2019. Pathology results reported by Stacie Acres, RN on 02/01/2019. Electronically Signed   By: Lovey Newcomer M.D.   On: 02/01/2019 13:50   Result Date: 02/01/2019 CLINICAL DATA:  Patient for ultrasound-guided core needle biopsy left breast mass 6 o'clock position and 12 o'clock position. EXAM: ULTRASOUND GUIDED LEFT BREAST CORE NEEDLE BIOPSY COMPARISON:  Previous exam(s). FINDINGS: I met with the patient and we discussed the procedure of ultrasound-guided biopsy, including benefits and alternatives. We discussed the high likelihood of a successful procedure. We discussed the risks of the procedure, including infection, bleeding, tissue injury, clip migration, and inadequate sampling. Informed written consent was given. The  usual time-out protocol was performed immediately prior to the procedure. Site 1: Left breast mass 12 o'clock position: Ribbon shaped clip Lesion quadrant: Upper outer quadrant Using sterile technique and 1% Lidocaine as local anesthetic, under direct ultrasound visualization, a 14 gauge spring-loaded device was used to perform biopsy of left breast mass 12 o'clock position using a medial approach. At the conclusion of the procedure ribbon shaped tissue marker clip was deployed into the biopsy cavity. Follow up 2 view mammogram was performed and dictated separately. Site 2: Left breast mass 6 o'clock position: Coil shaped clip. Lesion quadrant: Lower inner quadrant Using sterile technique and 1% Lidocaine as local anesthetic, under direct ultrasound visualization, a 14 gauge spring-loaded  device was used to perform biopsy of left breast mass 6 o'clock position using a medial approach. At the conclusion of the procedure coil shaped tissue marker clip was deployed into the biopsy cavity. Follow up 2 view mammogram was performed and dictated separately. IMPRESSION: Ultrasound guided biopsy of left breast mass 6 o'clock position and 12 o'clock position as above. No apparent complications. Electronically Signed: By: Lovey Newcomer M.D. On: 01/31/2019 14:57      IMPRESSION/PLAN: Left Breast Cancer   She is motivated to accomplish breast conservation with her surgeon.  If she conserves her breast she understands that radiotherapy would play an important role in her cure.  We discussed that it is possible she would need postmastectomy radiation but much less likely.  It was a pleasure meeting the patient today. We discussed the risks, benefits, and side effects of radiotherapy.  In the setting of breast conservation I will recommend radiotherapy to the left breast to reduce her risk of locoregional recurrence by 2/3.  We discussed that radiation would take approximately 4 weeks to complete and that I would give the patient a few weeks to heal following surgery before starting treatment planning.  If chemotherapy were to be given, this would precede radiotherapy. We spoke about acute effects including skin irritation and fatigue as well as much less common late effects including internal organ injury or irritation. We spoke about the latest technology that is used to minimize the risk of late effects for patients undergoing radiotherapy to the breast or chest wall. No guarantees of treatment were given. The patient is enthusiastic about proceeding with treatment. I look forward to participating in the patient's care.  I will await her referral back to me for postoperative follow-up and eventual CT simulation/treatment planning.   This encounter was provided by telemedicine platform by telephone as  patient was unable to access video conferencing during pandemic precautions The patient has given verbal consent for this type of encounter and has been advised to only accept a meeting of this type in a secure network environment. The time spent during this encounter was 20 minutes. The attendants for this meeting include Eppie Gibson  and [employer].  During the encounter, Eppie Gibson was located at [facility] Radiation Oncology Department.  Laural Roes Brutus was located at home.  __________________________________________   Eppie Gibson, MD   This document serves as a record of services personally performed by Eppie Gibson, MD. It was created on her behalf by Wilburn Mylar, a trained medical scribe. The creation of this record is based on the scribe's personal observations and the provider's statements to them. This document has been checked and approved by the attending provider.

## 2019-02-16 NOTE — Progress Notes (Signed)
[facility] CONSULT NOTE  Patient Care Team: Daye, Terrilee Files, FNP as PCP - General (Family Medicine)  CHIEF COMPLAINTS/PURPOSE OF CONSULTATION:  Newly diagnosed breast cancer  HISTORY OF PRESENTING ILLNESS:  Robin Anderson 78 y.o. female is here because of recent diagnosis of invasive ductal carcinoma of the left breast. The patient palpated a lump in her left breast for 4 months. Diagnostic mammogram and [place] on 01/27/19 showed a 2.5cm mass at the 6 o'clock position, a 0.7cm mass in the retroareolar left breast, and no abnormal left axillary adenopathy. Biopsy on 01/31/19 showed invasive ductal carcinoma, grade 2, HER-2 negative by FISH, ER+ 100%, PR+ 100%, Ki67 15%. She has a family history of breast cancer in her sister at 47yo. She presents to the clinic today for initial evaluation and discussion of treatment options.   I reviewed her records extensively and collaborated the history with the patient.  SUMMARY OF ONCOLOGIC HISTORY: Oncology History  Malignant neoplasm of lower-outer quadrant of left breast of female, estrogen receptor positive ([place])  01/31/2019 Initial Diagnosis   Palpable lump in the left breast x4 months mammogram 01/27/2019: 2.5 cm mass at 6 o'clock position, 0.7 cm mass retroareolar, axilla negative: Grade 2 IDC ER 100%, PR 100%, HER-2 negative, Ki-67 15% T2N0 stage IB   02/17/2019 Cancer Staging   Staging form: Breast, AJCC 8th Edition - Clinical stage from 02/17/2019: Stage IB (cT2, cN0, cM0, G2, ER+, PR+, HER2-) - Signed by Nicholas Lose, MD on 02/17/2019     MEDICAL HISTORY:  Past Medical History:  Diagnosis Date  . Blood in stool   . Chest pain, unspecified   . Headache 07/02/2014  . Heart murmur   . Hepatitis   . History of colon polyps   . HLD (hyperlipidemia)   . HTN (hypertension)   . Prediabetes 07/28/2011  . Seizure disorder [facility])     SURGICAL HISTORY: Past Surgical History:  Procedure Laterality Date  . ABDOMINAL HERNIA  REPAIR     lower mid line hernia which has reherniatied  . BUNIONECTOMY Right    traumatic injury to her foot that was repaired  . TONSILLECTOMY    . TUBAL LIGATION      SOCIAL HISTORY: Social History   Socioeconomic History  . Marital status: Married    Spouse name: [name]  . Number of children: 6  . Years of education: 63  . Highest education level: Not on file  Occupational History  . Occupation: retired  Scientific laboratory technician  . Financial resource strain: Not on file  . Food insecurity    Worry: Not on file    Inability: Not on file  . Transportation needs    Medical: No    Non-medical: No  Tobacco Use  . Smoking status: Former Smoker    Types: Cigarettes  . Smokeless tobacco: Never Used  . Tobacco comment: when she was young for a short period of time  Substance and Sexual Activity  . Alcohol use: No    Alcohol/week: 0.0 standard drinks  . Drug use: No  . Sexual activity: Not on file  Lifestyle  . Physical activity    Days per week: Not on file    Minutes per session: Not on file  . Stress: Not on file  Relationships  . Social Herbalist on phone: Not on file    Gets together: Not on file    Attends religious service: Not on file    Active member of  club or organization: Not on file    Attends meetings of clubs or organizations: Not on file    Relationship status: Not on file  . Intimate partner violence    Fear of current or ex partner: No    Emotionally abused: No    Physically abused: No    Forced sexual activity: No  Other Topics Concern  . Not on file  Social History Narrative   Patient lives at home with her husband Daanya Lanphier.   Retired.   Education high school.   Right handed..      Regular exercise-yes   Caffeine Use-yes    FAMILY HISTORY: Family History  Problem Relation Age of Onset  . Hypertension Mother   . Heart attack Father   . Hypertension Sister        5 sisters....3 have HTN  . Hypertension Brother   . Breast  cancer Sister   . Rectal cancer Sister   . Heart attack Daughter     ALLERGIES:  is allergic to penicillins.  MEDICATIONS:  Current Outpatient Medications  Medication Sig Dispense Refill  . anastrozole (ARIMIDEX) 1 MG tablet Take 1 tablet (1 mg total) by mouth daily. 90 tablet 3  . aspirin EC 81 MG tablet Take 1 tablet (81 mg total) by mouth daily.    . benazepril-hydrochlorthiazide (LOTENSIN HCT) 20-12.5 MG tablet TAKE 1 TABLET BY MOUTH EVERY DAY 30 tablet 11  . Multiple Vitamins-Minerals (ICAPS MV PO) Take by mouth daily.    . primidone (MYSOLINE) 50 MG tablet TAKE 3 TABLETS BY MOUTH AT BEDTIME 270 tablet 1  . simvastatin (ZOCOR) 20 MG tablet TAKE 1 TABLET BY MOUTH EVERYDAY AT BEDTIME 90 tablet 3   No current facility-administered medications for this visit.     REVIEW OF SYSTEMS:   Constitutional: Denies fevers, chills or abnormal night sweats Eyes: Denies blurriness of vision, double vision or watery eyes Ears, nose, mouth, throat, and face: Denies mucositis or sore throat Respiratory: Denies cough, dyspnea or wheezes Cardiovascular: Denies palpitation, chest discomfort or lower extremity swelling Gastrointestinal:  Denies nausea, heartburn or change in bowel habits Skin: Denies abnormal skin rashes Lymphatics: Denies new lymphadenopathy or easy bruising Neurological:Denies numbness, tingling or new weaknesses Behavioral/Psych: Mood is stable, no new changes  Breast: Palpable left breast mass All other systems were reviewed with the patient and are negative.  PHYSICAL EXAMINATION: ECOG PERFORMANCE STATUS: 1 - Symptomatic but completely ambulatory  Vitals:   02/17/19 1330  BP: (!) 194/82  Pulse: 64  Resp: 18  Temp: 99.1 F (37.3 C)  SpO2: 100%   Filed Weights   02/17/19 1330  Weight: 143 lb 8 oz (65.1 kg)    GENERAL:alert, no distress and comfortable SKIN: skin color, texture, turgor are normal, no rashes or significant lesions EYES: normal, conjunctiva are  pink and non-injected, sclera clear OROPHARYNX:no exudate, no erythema and lips, buccal mucosa, and tongue normal  NECK: supple, thyroid normal size, non-tender, without nodularity LYMPH:  no palpable lymphadenopathy in the cervical, axillary or inguinal LUNGS: clear to auscultation and percussion with normal breathing effort HEART: regular rate & rhythm and no murmurs and no lower extremity edema ABDOMEN:abdomen soft, non-tender and normal bowel sounds Musculoskeletal:no cyanosis of digits and no clubbing  PSYCH: alert & oriented x 3 with fluent speech NEURO: no focal motor/sensory deficits BREAST: Palpable lump underneath the left breast. No palpable axillary or supraclavicular lymphadenopathy (exam performed in the presence of a chaperone)   LABORATORY DATA:  I  have reviewed the data as listed Lab Results  Component Value Date   WBC 5.7 01/11/2019   HGB 12.0 01/11/2019   HCT 38.8 01/11/2019   MCV 88 01/11/2019   PLT 163 01/11/2019   Lab Results  Component Value Date   NA 141 01/11/2019   K 3.6 01/11/2019   CL 102 01/11/2019   CO2 25 01/11/2019    RADIOGRAPHIC STUDIES: I have personally reviewed the radiological reports and agreed with the findings in the report.  ASSESSMENT AND PLAN:  Malignant neoplasm of lower-outer quadrant of left breast of female, estrogen receptor positive ([place]) 01/31/2019:Palpable lump in the left breast x4 months mammogram 01/27/2019: 2.5 cm mass at 6 o'clock position, 0.7 cm mass retroareolar, axilla negative: Grade 2 IDC ER 100%, PR 100%, HER-2 negative, Ki-67 15%  T2N0 stage IB  Pathology and radiology counseling:Discussed with the patient, the details of pathology including the type of breast cancer,the clinical staging, the significance of ER, PR and HER-2/neu receptors and the implications for treatment. After reviewing the pathology in detail, we proceeded to discuss the different treatment options between surgery, radiation, chemotherapy,  antiestrogen therapies.  Recommendations: 1.  Because she has 2 areas of disease, Dr. Brantley Stage wants [place] to do antiestrogen therapy to decrease the size of the tumor to enable for breast conserving surgery. 2. Breast conserving surgery followed by 3. Adjuvant radiation therapy followed by 4.  Continuation of adjuvant antiestrogen therapy  Anastrozole counseling:We discussed the risks and benefits of anti-estrogen therapy with aromatase inhibitors. These include but not limited to insomnia, hot flashes, mood changes, vaginal dryness, bone density loss, and weight gain. We strongly believe that the benefits far outweigh the risks. Patient understands these risks and consented to starting treatment. Planned treatment duration is 5 years.  Our plan is to perform mammogram and ultrasound in 3 months and then again in 6 months  Return to clinic 1 month to discuss tolerability to anastrozole.    All questions were answered. The patient knows to call the clinic with any problems, questions or concerns.   Rulon Eisenmenger, MD, MPH 02/17/2019    I, Molly Dorshimer, am acting as scribe for Nicholas Lose, MD.  I have reviewed the above documentation for accuracy and completeness, and I agree with the above.

## 2019-02-17 ENCOUNTER — Encounter: Payer: Self-pay | Admitting: Radiation Oncology

## 2019-02-17 ENCOUNTER — Telehealth: Payer: Self-pay | Admitting: Hematology and Oncology

## 2019-02-17 ENCOUNTER — Inpatient Hospital Stay: Payer: Medicare Other | Attending: Hematology and Oncology | Admitting: Hematology and Oncology

## 2019-02-17 ENCOUNTER — Other Ambulatory Visit: Payer: Self-pay

## 2019-02-17 DIAGNOSIS — E785 Hyperlipidemia, unspecified: Secondary | ICD-10-CM | POA: Diagnosis not present

## 2019-02-17 DIAGNOSIS — I10 Essential (primary) hypertension: Secondary | ICD-10-CM | POA: Insufficient documentation

## 2019-02-17 DIAGNOSIS — C50512 Malignant neoplasm of lower-outer quadrant of left female breast: Secondary | ICD-10-CM | POA: Diagnosis not present

## 2019-02-17 DIAGNOSIS — Z170 Estrogen receptor positive status [ER+]: Secondary | ICD-10-CM | POA: Diagnosis not present

## 2019-02-17 MED ORDER — ANASTROZOLE 1 MG PO TABS: 1.0000 mg | ORAL_TABLET | Freq: Every day | ORAL | 3 refills | Status: DC

## 2019-02-17 NOTE — Telephone Encounter (Signed)
Gave patient avs report and appointments for November, including 11/5 genetics appointments. Genetics appointment will be an in person visit. Patient does not have capability for virtual visits.

## 2019-02-17 NOTE — Assessment & Plan Note (Addendum)
01/31/2019:Palpable lump in the left breast x4 months mammogram 01/27/2019: 2.5 cm mass at 6 o'clock position, 0.7 cm mass retroareolar, axilla negative: Grade 2 IDC ER 100%, PR 100%, HER-2 negative, Ki-67 15%  T2N0 stage IB  Pathology and radiology counseling:Discussed with the patient, the details of pathology including the type of breast cancer,the clinical staging, the significance of ER, PR and HER-2/neu receptors and the implications for treatment. After reviewing the pathology in detail, we proceeded to discuss the different treatment options between surgery, radiation, chemotherapy, antiestrogen therapies.  Recommendations: 1.  Because she has 2 areas of disease, Dr. Brantley Stage wants [place] to do antiestrogen therapy to decrease the size of the tumor to enable for breast conserving surgery. 2. Breast conserving surgery followed by 3. Adjuvant radiation therapy followed by 4.  Continuation of adjuvant antiestrogen therapy  Anastrozole counseling:We discussed the risks and benefits of anti-estrogen therapy with aromatase inhibitors. These include but not limited to insomnia, hot flashes, mood changes, vaginal dryness, bone density loss, and weight gain. We strongly believe that the benefits far outweigh the risks. Patient understands these risks and consented to starting treatment. Planned treatment duration is 5 years.  Our plan is to perform mammogram and ultrasound in 3 months and then again in 6 months  Return to clinic 1 month to discuss tolerability to anastrozole.

## 2019-02-20 ENCOUNTER — Encounter: Payer: Self-pay | Admitting: *Deleted

## 2019-02-21 ENCOUNTER — Other Ambulatory Visit: Payer: Medicare Other

## 2019-02-23 ENCOUNTER — Encounter: Payer: Self-pay | Admitting: Licensed Clinical Social Worker

## 2019-02-23 ENCOUNTER — Inpatient Hospital Stay: Payer: Medicare Other | Attending: Hematology and Oncology | Admitting: Licensed Clinical Social Worker

## 2019-02-23 ENCOUNTER — Other Ambulatory Visit: Payer: Self-pay

## 2019-02-23 DIAGNOSIS — Z803 Family history of malignant neoplasm of breast: Secondary | ICD-10-CM | POA: Diagnosis not present

## 2019-02-23 DIAGNOSIS — Z170 Estrogen receptor positive status [ER+]: Secondary | ICD-10-CM

## 2019-02-23 DIAGNOSIS — C50512 Malignant neoplasm of lower-outer quadrant of left female breast: Secondary | ICD-10-CM | POA: Insufficient documentation

## 2019-02-23 DIAGNOSIS — Z79811 Long term (current) use of aromatase inhibitors: Secondary | ICD-10-CM | POA: Insufficient documentation

## 2019-02-23 NOTE — Progress Notes (Signed)
CARDIOLOGY OFFICE NOTE  Date:  02/28/2019    Robin Anderson Date of Birth: [birth year] Medical Record [id number]  PCP:  Loretha Brasil, FNP  Cardiologist:  Cyndra Numbers    Chief Complaint  Patient presents with  . Follow-up    History of Present Illness: Robin Anderson is a 78 y.o. female who presents today for a 6 month check.  She is a former patient of Dr. Winnifred Friar. Was to establish with Dr. Stanford Breed but never did.She has seen me over the past several years.  She has had atypical chest pain, HTN, HLD and glucose intolerance. Other issues as noted below and include seizure disorder. Remote normal Myoview from 2004 noted.   Has seen me over the past several years - hadstopped her Tegretolat a prior visitand I advised her to alert neurology.She has tended to stop medicines on her own and we have added back. We did a telehealth visit back in May- no real concerns noted.   The patient does not have symptoms concerning for COVID-19 infection (fever, chills, cough, or new shortness of breath).   Comes in today. Here alone. She is doing ok but has been found to have breast cancer. She is on Arimidex. Seeing Dr. Lindi Adie. She was found to have 2 areas of disease - she is to antiestrogen therapy to decrease the size and then proceed with surgery - followed by XRT. BP is up today. She is pretty upset about how she was diagnosed with her cancer. But she notes her BP has been trending up. She says she is taking her medicines. No chest pain. She stays pretty active. Does all her own housework.   Past Medical History:  Diagnosis Date  . Blood in stool   . Chest pain, unspecified   . Family history of breast cancer   . Headache 07/02/2014  . Heart murmur   . Hepatitis   . History of colon polyps   . HLD (hyperlipidemia)   . HTN (hypertension)   . Prediabetes 07/28/2011  . Seizure disorder [facility])     Past Surgical History:  Procedure Laterality Date  .  ABDOMINAL HERNIA REPAIR     lower mid line hernia which has reherniatied  . BUNIONECTOMY Right    traumatic injury to her foot that was repaired  . TONSILLECTOMY    . TUBAL LIGATION       Medications: Current Meds  Medication Sig  . anastrozole (ARIMIDEX) 1 MG tablet Take 1 tablet (1 mg total) by mouth daily.  Marland Kitchen aspirin EC 81 MG tablet Take 1 tablet (81 mg total) by mouth daily.  . Multiple Vitamins-Minerals (ICAPS MV PO) Take by mouth daily.  . primidone (MYSOLINE) 50 MG tablet TAKE 3 TABLETS BY MOUTH AT BEDTIME  . simvastatin (ZOCOR) 20 MG tablet TAKE 1 TABLET BY MOUTH EVERYDAY AT BEDTIME  . [DISCONTINUED] benazepril-hydrochlorthiazide (LOTENSIN HCT) 20-12.5 MG tablet TAKE 1 TABLET BY MOUTH EVERY DAY     Allergies: Allergies  Allergen Reactions  . Penicillins Rash    Social History: The patient  reports that she has quit smoking. Her smoking use included cigarettes. She has never used smokeless tobacco. She reports that she does not drink alcohol or use drugs.   Family History: The patient's family history includes Breast cancer in her sister; Heart attack in her daughter and father; Hypertension in her brother, mother, and sister.   Review of Systems: Please see the history of present illness.  All other systems are reviewed and negative.   Physical Exam: VS:  BP (!) 170/70   Pulse (!) 57   Ht 5\' 4"  (1.626 m)   Wt 145 lb 12.8 oz (66.1 kg)   SpO2 100%   BMI 25.03 kg/m  .  BMI Body mass index is 25.03 kg/m.  Wt Readings from Last 3 Encounters:  02/28/19 145 lb 12.8 oz (66.1 kg)  02/17/19 143 lb 8 oz (65.1 kg)  01/11/19 144 lb 3.2 oz (65.4 kg)    General: Pleasant. Well developed, well nourished and in no acute distress.   HEENT: Normal.  Neck: Supple, no JVD, carotid bruits, or masses noted.  Cardiac: Regular rate and rhythm. No murmurs, rubs, or gallops. No edema.  Respiratory:  Lungs are clear to auscultation bilaterally with normal work of breathing.  GI:  Soft and nontender.  MS: No deformity or atrophy. Gait and ROM intact.  Skin: Warm and dry. Color is normal.  Neuro:  Strength and sensation are intact and no gross focal deficits noted.  Psych: Alert, appropriate and with normal affect.   LABORATORY DATA:  EKG:  EKG is ordered today. This demonstrates sinus brady - PVC and PAC noted - HR is 57.  Lab Results  Component Value Date   WBC 5.7 01/11/2019   HGB 12.0 01/11/2019   HCT 38.8 01/11/2019   PLT 163 01/11/2019   GLUCOSE 88 01/11/2019   CHOL 146 10/13/2017   TRIG 46 10/13/2017   HDL 62 10/13/2017   LDLCALC 75 10/13/2017   ALT 10 01/11/2019   AST 14 01/11/2019   NA 141 01/11/2019   K 3.6 01/11/2019   CL 102 01/11/2019   CREATININE 1.13 (H) 01/11/2019   BUN 15 01/11/2019   CO2 25 01/11/2019   TSH 1.880 09/01/2017   HGBA1C 5.8 08/10/2012     BNP (last 3 results) No results for input(s): BNP in the last 8760 hours.  ProBNP (last 3 results) No results for input(s): PROBNP in the last 8760 hours.   Other Studies Reviewed Today:  MRI IMPRESSION of brain 06/2014:  Unremarkable MRI brain (without). Mild diffuse atrophy. No acute findings.    ASSESSMENT & PLAN:    1. HTN -BP is up today. Repeat by me is 150/80. I am increasing her Lotensin HCT to 20-25 mg a day. Recheck lab on return.   2. Newly found breast cancer- on antiestrogen therapy before proceeding with surgery - she would be a satisfactory candidate for surgery when needed. She has no known CAD. Can do over 4 mets of activity.   3. HLD - on Zocor - needs lipids - will add to return labs.   4. Seizure disorder - on Primidone now.    5. Asymptomatic bradycardia- she is asymptomatic - not on any rate slowing medicines.HR today is 57 - would follow.   6. COVID-19 Education: The signs and symptoms of COVID-19 were discussed with the patient and how to seek care for testing (follow up with PCP or arrange E-visit).  The importance of social  distancing, staying at home, hand hygiene and wearing a mask when out in public were discussed today.  Current medicines are reviewed with the patient today.  The patient does not have concerns regarding medicines other than what has been noted above.  The following changes have been made:  See above.  Labs/ tests ordered today include:    Orders Placed This Encounter  Procedures  . EKG 12-Lead  Disposition:   FU with me in about 6 weeks - check lab on return.   Patient is agreeable to this plan and will call if any problems develop in the interim.   SignedTruitt Merle, NP  02/28/2019 11:05 AM  [facility] [address] [place], [place]  [address] Phone: [phone] Fax: [phone]

## 2019-02-23 NOTE — Progress Notes (Signed)
REFERRING PROVIDER: Erroll Luna, MD [address] [place],  [place] [address]  PRIMARY PROVIDER:  Loretha Brasil, FNP  PRIMARY REASON FOR VISIT:  1. 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 Family history of breast cancer      HISTORY OF PRESENT ILLNESS:   Robin Anderson, a 78 y.o. female, was seen for a [place] cancer genetics consultation at the request of Dr. Brantley Stage due to a personal and family history of cancer.  Robin Anderson presents to clinic today to discuss the possibility of a hereditary predisposition to cancer, genetic testing, and to further clarify her future cancer risks, as well as potential cancer risks for family members.   In 2020, at the age of 71, Robin Anderson was diagnosed with invasive ductal carcinoma of the left breast, ER/PR+, Her2-. The treatment plan includes antiestrogen therapy, surgery, radiation, and continued antiestrogen therapy.    CANCER HISTORY:  Oncology History  Malignant neoplasm of lower-outer quadrant of left breast of female, estrogen receptor positive ([place])  01/31/2019 Initial Diagnosis   Palpable lump in the left breast x4 months mammogram 01/27/2019: 2.5 cm mass at 6 o'clock position, 0.7 cm mass retroareolar, axilla negative: Grade 2 IDC ER 100%, PR 100%, HER-2 negative, Ki-67 15% T2N0 stage IB   02/17/2019 Cancer Staging   Staging form: Breast, AJCC 8th Edition - Clinical stage from 02/17/2019: Stage IB (cT2, cN0, cM0, G2, ER+, PR+, HER2-) - Signed by Nicholas Lose, MD on 02/17/2019      RISK FACTORS:  Menarche was at age 3.  First live birth at age 23.  OCP use for more than 5 years. Ovaries intact: yes.  Hysterectomy: no.  Menopausal status: postmenopausal.  HRT use: 0 years. Colonoscopy: yes; normal. Mammogram within the last year: yes. Number of breast biopsies: 1.   Past Medical History:  Diagnosis Date  . Blood in stool   . Chest pain,  unspecified   . Family history of breast cancer   . Headache 07/02/2014  . Heart murmur   . Hepatitis   . History of colon polyps   . HLD (hyperlipidemia)   . HTN (hypertension)   . Prediabetes 07/28/2011  . Seizure disorder [facility])     Past Surgical History:  Procedure Laterality Date  . ABDOMINAL HERNIA REPAIR     lower mid line hernia which has reherniatied  . BUNIONECTOMY Right    traumatic injury to her foot that was repaired  . TONSILLECTOMY    . TUBAL LIGATION      Social History   Socioeconomic History  . Marital status: Married    Spouse name: Robin Anderson  . Number of children: 6  . Years of education: 58  . Highest education level: Not on file  Occupational History  . Occupation: retired  Scientific laboratory technician  . Financial resource strain: Not on file  . Food insecurity    Worry: Not on file    Inability: Not on file  . Transportation needs    Medical: No    Non-medical: No  Tobacco Use  . Smoking status: Former Smoker    Types: Cigarettes  . Smokeless tobacco: Never Used  . Tobacco comment: when she was young for a short period of time  Substance and Sexual Activity  . Alcohol use: No    Alcohol/week: 0.0 standard drinks  . Drug use: No  . Sexual activity: Not on file  Lifestyle  .  Physical activity    Days per week: Not on file    Minutes per session: Not on file  . Stress: Not on file  Relationships  . Social Herbalist on phone: Not on file    Gets together: Not on file    Attends religious service: Not on file    Active member of club or organization: Not on file    Attends meetings of clubs or organizations: Not on file    Relationship status: Not on file  Other Topics Concern  . Not on file  Social History Narrative   Patient lives at home with her husband Robin Anderson.   Retired.   Education high school.   Right handed..      Regular exercise-yes   Caffeine Use-yes     FAMILY HISTORY:  We obtained a detailed, 4-generation family  history.  Significant diagnoses are listed below: Family History  Problem Relation Age of Onset  . Hypertension Mother   . Heart attack Father   . Hypertension Sister        5 sisters....3 have HTN  . Hypertension Brother   . Breast cancer Sister   . Heart attack Daughter    Robin Anderson has 3 daughters and 3 sons. She believes one of her daughters had some sort of breast surgery but she is unsure if it was for cancer or not. She has 5 sisters and 2 brothers. One of her sisters was diagnosed with breast cancer at 76 and died at 77.   Robin Anderson mother died at 84 due to an aneurysm. She was an only child. Patient's maternal grandmother died at 3, she is unsure when her maternal grandfather died. No cancers.  Robin Anderson father died at 16 due to a heart attack. Patient had 4 paternal aunts, 3 paternal uncles. She does believe 2 of her aunts had cancer but is unsure the type. She has one living aunt who she plans to ask for more information. No known cancers in paternal cousins. Paternal grandparents passed in their 67s, no cancers.  Robin Anderson is unaware of previous family history of genetic testing for hereditary cancer risks. Patient's ancestors are of [place] and [place] descent. There is no reported Ashkenazi Jewish ancestry. There is no known consanguinity.  GENETIC COUNSELING ASSESSMENT: Robin Anderson is a 78 y.o. female with a personal and family history of cancer that is not particularly suggestive of a hereditary cancer syndrome. We, therefore, discussed and recommended the following at today's visit.   DISCUSSION: We discussed that 5 - 10% of breast cancer is hereditary, with most cases associated with BRCA1/BRCA2 mutations.  There are other genes that can be associated with hereditary breast cancer syndromes.  We discussed that if she found out her daughter had breast cancer, or her paternal aunts had breast/ovarian/pancreatic cancer, she would have a slightly more  concerning family history and would then qualify for genetic testing. We still discussed testing so she would have the option. We discussed that testing is beneficial for several reasons including surgical decision-making for breast cancer, knowing how to follow individuals after completing their treatment, and understand if other family members could be at risk for cancer and allow them to undergo genetic testing.   We reviewed the characteristics, features and inheritance patterns of hereditary cancer syndromes. We also discussed genetic testing, including the appropriate family members to test, the process of testing, insurance coverage and turn-around-time for results. We discussed the implications  of a negative, positive and/or variant of uncertain significant result. Given that she does not meet criteria for testing, we did not recommend any testing at this time. We did offer for Robin Anderson to  pursue testing if she felt like this was information she still wanted, through the [facility] Common Hereditary Cancers Panel.   The Common Hereditary Cancers Panel offered by [facility] includes sequencing and/or deletion duplication testing of the following 48 genes: APC, ATM, AXIN2, BARD1, BMPR1A, BRCA1, BRCA2, BRIP1, CDH1, CDKN2A (p14ARF), CDKN2A (p16INK4a), CKD4, CHEK2, CTNNA1, DICER1, EPCAM (Deletion/duplication testing only), GREM1 (promoter region deletion/duplication testing only), KIT, MEN1, MLH1, MSH2, MSH3, MSH6, MUTYH, NBN, NF1, NHTL1, PALB2, PDGFRA, PMS2, POLD1, POLE, PTEN, RAD50, RAD51C, RAD51D, RNF43, SDHB, SDHC, SDHD, SMAD4, SMARCA4. STK11, TP53, TSC1, TSC2, and VHL.  The following genes were evaluated for sequence changes only: SDHA and HOXB13 c.251G>A variant only.  Based on Robin Anderson's personal and family history of cancer, she does not meet medical criteria for testing.   PLAN:  Robin Anderson did not wish to pursue genetic testing at today's visit. We understand this decision and remain available to  coordinate genetic testing at any time in the future. We, therefore, recommend Robin Anderson continue to follow the cancer screening guidelines given by her primary healthcare provider. We also encouraged her to find out more about her daughter's breast surgery and her paternal aunts' cancer to see if she may qualify for testing.   Ms. Zuba questions were answered to her satisfaction today. Our contact information was provided should additional questions or concerns arise. Thank you for the referral and allowing [place] to share in the care of your patient.   Robin Rogue, MS, [facility] Genetic Counselor [email or web address] Phone: [phone]  The patient was seen for a total of 30 minutes in face-to-face genetic counseling.  Drs. Magrinat, Lindi Adie and/or Burr Medico were available for discussion regarding this case.   _______________________________________________________________________ For Office Staff:  Number of people involved in session: 1 Was an Intern/ student involved with case: no

## 2019-02-27 ENCOUNTER — Other Ambulatory Visit: Payer: Self-pay

## 2019-02-27 ENCOUNTER — Ambulatory Visit
Admission: RE | Admit: 2019-02-27 | Discharge: 2019-02-27 | Disposition: A | Discharge status: Home / Self Care | Payer: Medicare Other | Source: Ambulatory Visit | Attending: Surgery | Admitting: Surgery

## 2019-02-27 DIAGNOSIS — Z853 Personal history of malignant neoplasm of breast: Secondary | ICD-10-CM | POA: Diagnosis not present

## 2019-02-27 DIAGNOSIS — Z170 Estrogen receptor positive status [ER+]: Secondary | ICD-10-CM

## 2019-02-27 DIAGNOSIS — C50912 Malignant neoplasm of unspecified site of left female breast: Secondary | ICD-10-CM

## 2019-02-27 IMAGING — MR MR BREAST BILAT WO/W CM
8 of 12 series · 32 of 48 positions shown · IV contrast (6ml gadavist)
Comparison: Previous exam(s).

CLINICAL DATA: 78-year-old with 2 sites of recently diagnosed left
breast cancer.

LABS:  Creatinine was obtained on site at [HOSPITAL] at [REDACTED] [HOSPITAL].
Results: Creatinine 1.1 mg/dL.
EXAM:
BILATERAL BREAST MRI WITH AND WITHOUT CONTRAST
TECHNIQUE: Multiplanar, multisequence MR images of both breasts were obtained
prior to and following the intravenous administration of 6 ml of
Gadavist.

[Series 4: t2_tirm_tra ipat (a-p) · axial · 3.0mm · 0.70mm/px · 1 of 55 slices shown]
[im 1/55]
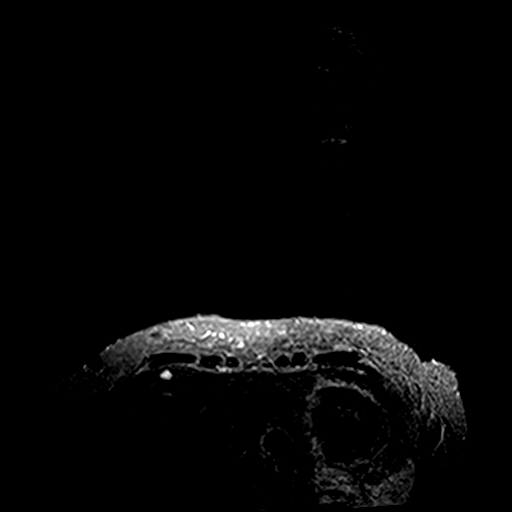

[Series 5: fl3d pre-cm no · axial · non-contrast · 1.2mm · 0.94mm/px · z∈[-64,+108]mm · 5 of 144 slices shown]
[im 1/144]
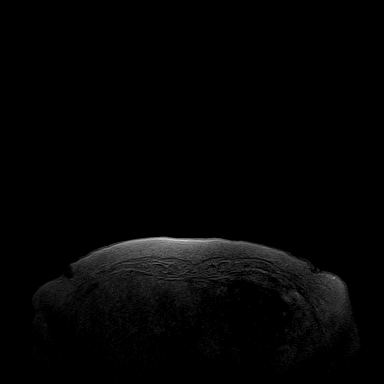
[im 36/144]
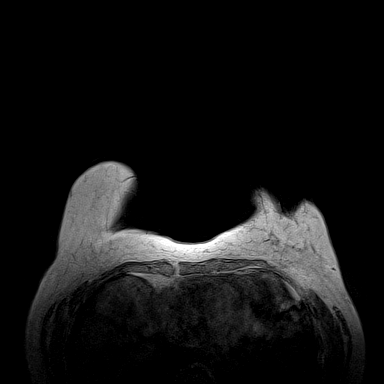
[im 72/144]
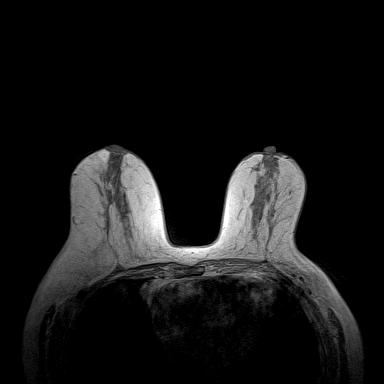
[im 108/144]
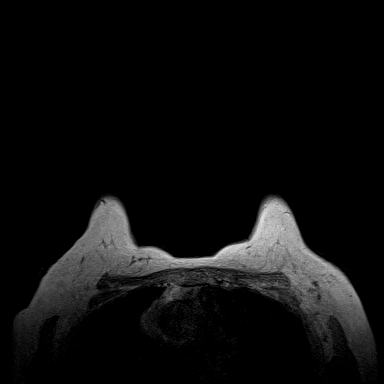
[im 144/144]
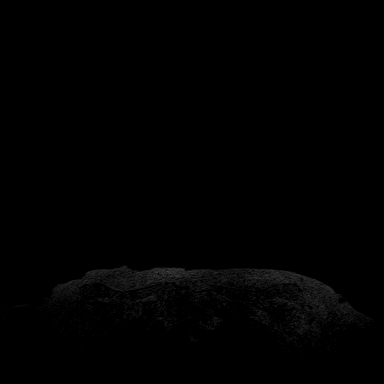

[Series 6: fl3d pre-cm · axial · non-contrast · 1.2mm · 0.94mm/px · z∈[-64,+108]mm · 5 of 144 slices shown]
[im 1/144]
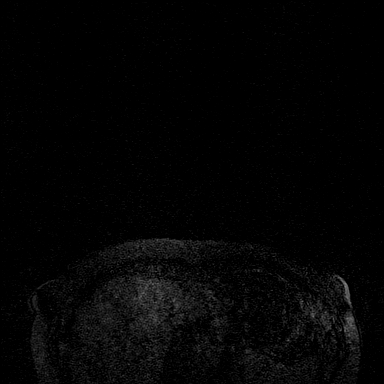
[im 36/144]
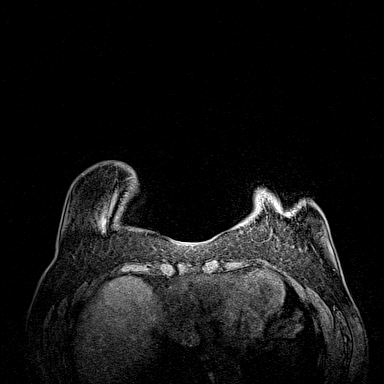
[im 72/144]
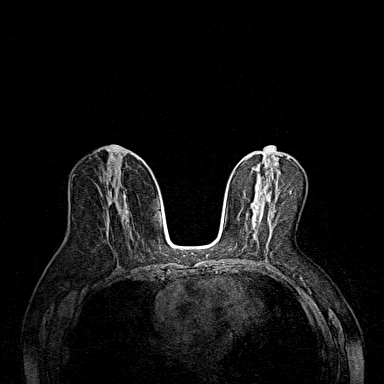
[im 108/144]
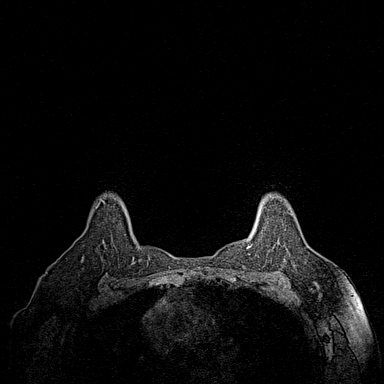
[im 144/144]
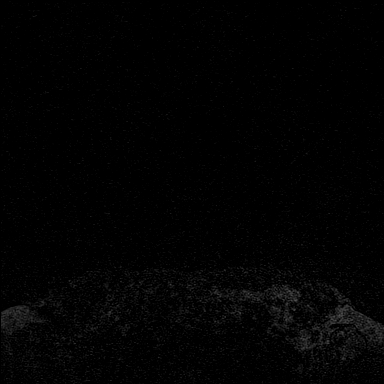

[Series 7: fl3d post-cm 20 · axial · 1.2mm · 0.94mm/px · z∈[-64,+108]mm · 5 of 144 slices shown (1 of 3)]
[im 1/144]
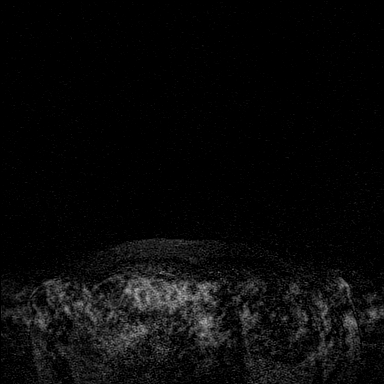
[im 36/144]
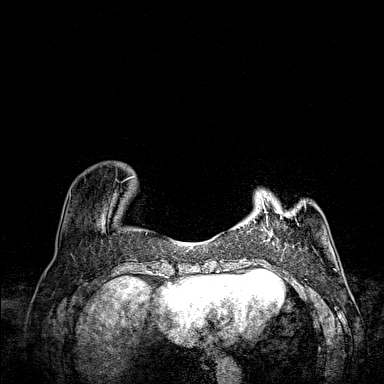
[im 72/144]
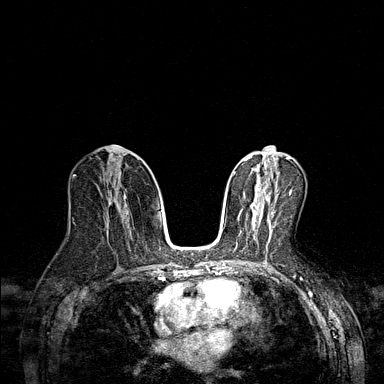
[im 108/144]
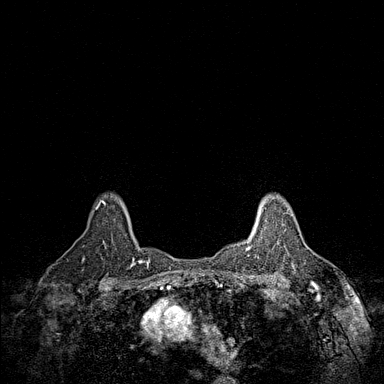
[im 144/144]
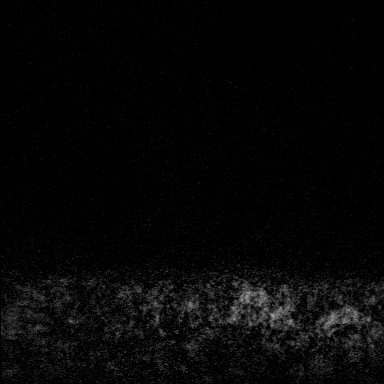

[Series 8: fl3d post-cm 20 · axial · 1.2mm · 0.94mm/px · z∈[-64,+108]mm · 5 of 144 slices shown (2 of 3)]
[im 1/144]
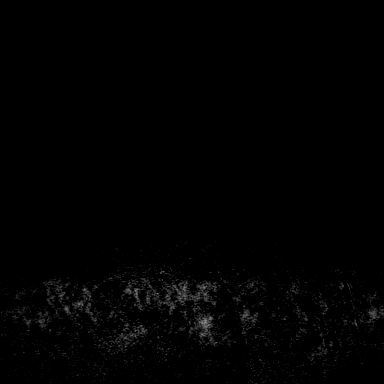
[im 36/144]
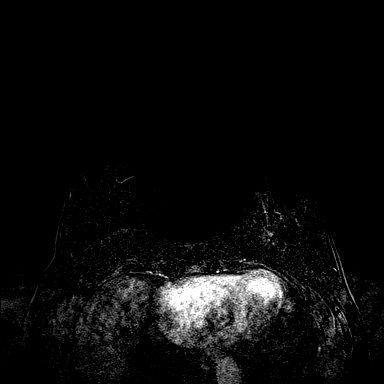
[im 72/144]
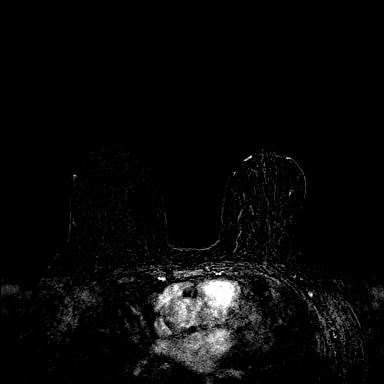
[im 108/144]
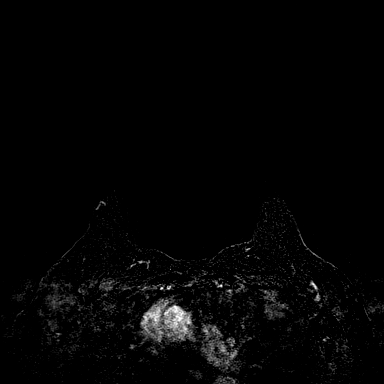
[im 144/144]
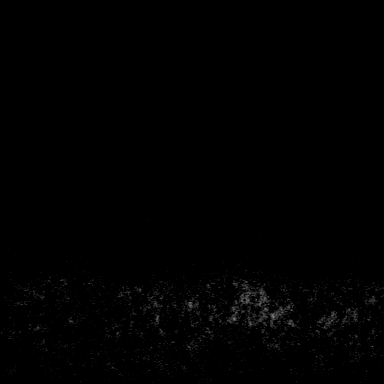

[Series 9: fl3d post-cm 20 · axial · 172.8mm · 0.94mm/px · 1 of 1 slices shown (3 of 3)]
[im 1/1]
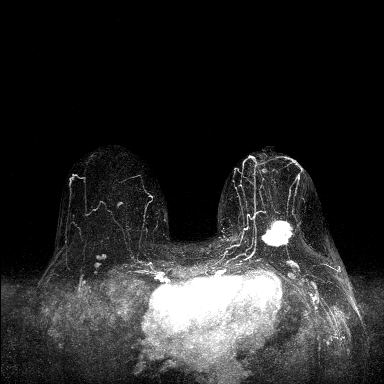

[Series 10: fl3d post-cm 3min · axial · 1.2mm · 0.94mm/px · z∈[-64,+108]mm · 6 of 144 slices shown]
[im 1/144]
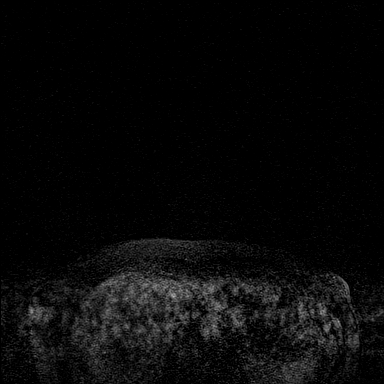
[im 29/144]
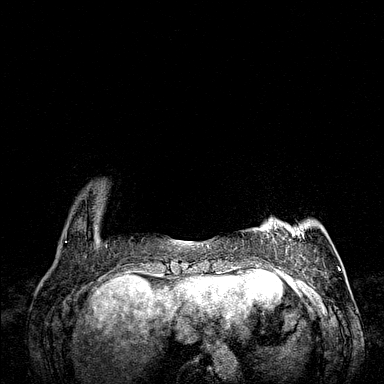
[im 58/144]
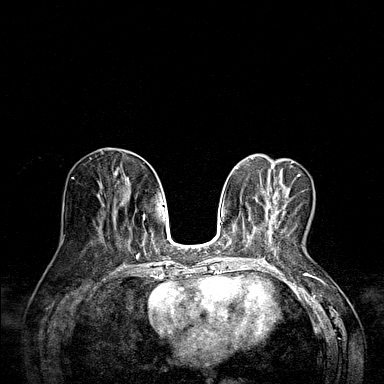
[im 86/144]
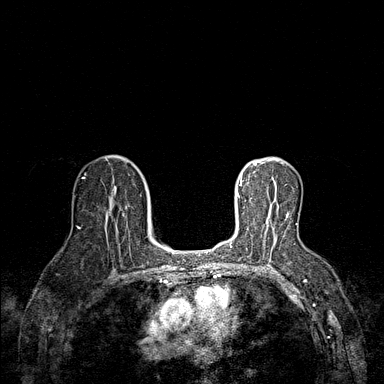
[im 115/144]
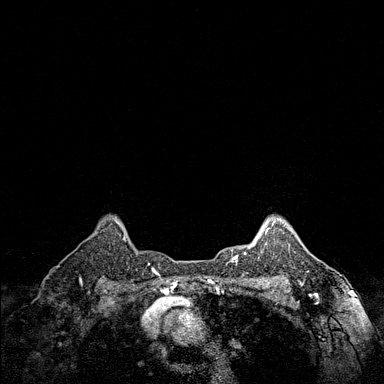
[im 144/144]
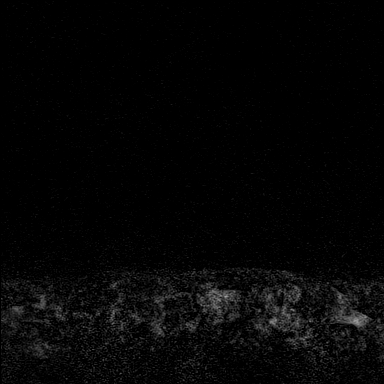

[Series 11: fl3d post-cm 3min_sub · axial · 1.2mm · 0.94mm/px · z∈[-64,+38]mm · 4 of 144 slices shown]
[im 1/144]
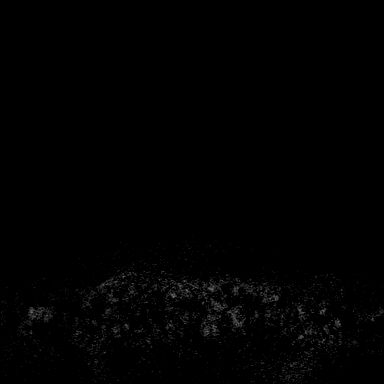
[im 29/144]
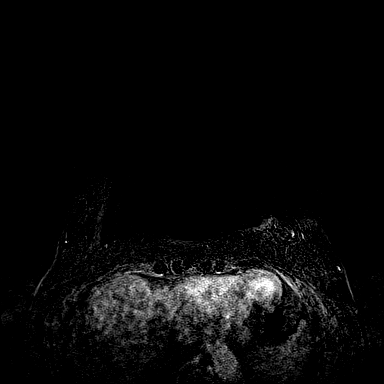
[im 58/144]
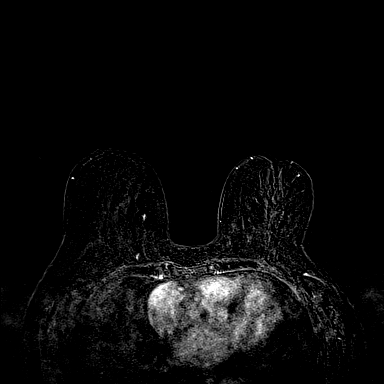
[im 86/144]
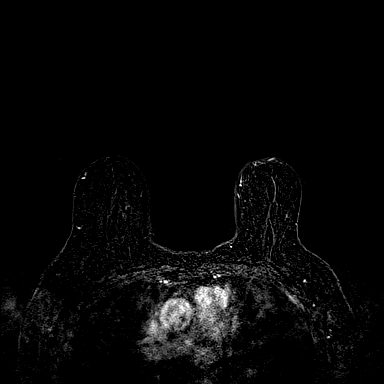

[32 of 48 positions shown; findings below may reference images not displayed]

Three-dimensional MR images were rendered by post-processing of the
original MR data on an independent workstation. The
three-dimensional MR images were interpreted, and findings are
reported in the following complete MRI report for this study. Three
dimensional images were evaluated at the independent [employer]
workstation
FINDINGS: Breast composition: c. Heterogeneous fibroglandular tissue.

Background parenchymal enhancement: Minimal.

Right breast: An 8 x 4 x 7 mm irregular, enhancing mass is
identified in the slightly medial right breast at middle depth
(series 5, image 84/144). There is no associated C2 correlate. It
demonstrates areas of suspicious washout kinetics. No other mass or
abnormal enhancement.

Left breast: Susceptibility artifact from post biopsy clip is seen
in association with a spiculated enhancing mass in the inferior left
breast at posterior depth (series 5, image 100/144). It measures
x 2.4 x 2.6 cm. This is consistent with the site of biopsy proven
malignancy at the 6 o'clock position. An additional, similar
appearing mass is identified in the superior subareolar aspect
(series 5, image 66/144). It measures 5 x 5 x 6 mm. This is
consistent with the site of biopsy proven malignancy at the 12
o'clock position. Both masses demonstrate suspicious washout
kinetics. No other mass or abnormal enhancement is identified in the
left breast.

Lymph nodes: No abnormal appearing lymph nodes.

Ancillary findings:  None.
IMPRESSION: 1. Two sites of biopsy proven malignancy within the left breast.
2. Indeterminate 8 mm right breast mass. This is unlikely to be seen
by second look ultrasound given location and the patient's breast
density. Recommendation is to proceed directly to MRI guided biopsy.
3. No suspicious lymphadenopathy.

RECOMMENDATION:
MRI guided biopsy of the right breast.

BI-RADS CATEGORY  4: Suspicious.

## 2019-02-27 MED ORDER — GADOBUTROL 1 MMOL/ML IV SOLN
6.0000 mL | Freq: Once | INTRAVENOUS | Status: AC | PRN
  Administered 2019-02-27: 6 mL via INTRAVENOUS

## 2019-02-28 ENCOUNTER — Ambulatory Visit (INDEPENDENT_AMBULATORY_CARE_PROVIDER_SITE_OTHER): Payer: Medicare Other | Admitting: Nurse Practitioner

## 2019-02-28 ENCOUNTER — Encounter: Payer: Self-pay | Admitting: Nurse Practitioner

## 2019-02-28 VITALS — BP 170/70 | HR 57 | Ht 64.0 in | Wt 145.8 lb

## 2019-02-28 DIAGNOSIS — Z7189 Other specified counseling: Secondary | ICD-10-CM | POA: Diagnosis not present

## 2019-02-28 DIAGNOSIS — E7800 Pure hypercholesterolemia, unspecified: Secondary | ICD-10-CM | POA: Diagnosis not present

## 2019-02-28 DIAGNOSIS — E785 Hyperlipidemia, unspecified: Secondary | ICD-10-CM | POA: Diagnosis not present

## 2019-02-28 DIAGNOSIS — I10 Essential (primary) hypertension: Secondary | ICD-10-CM

## 2019-02-28 MED ORDER — BENAZEPRIL-HYDROCHLOROTHIAZIDE 20-25 MG PO TABS: 1.0000 | ORAL_TABLET | Freq: Every day | ORAL | 3 refills | Status: DC

## 2019-02-28 NOTE — Patient Instructions (Addendum)
After Visit Summary:  We will be checking the following labs today - None  We will recheck lab in 6 weeks on return visit with me   Medication Instructions:    Continue with your current medicines. BUT  I am increasing the Lotensin HCTZ to 20-25 mg daily - start tomorrow - I have sent this to your pharmacy.    If you need a refill on your cardiac medications before your next appointment, please call your pharmacy.     Testing/Procedures To Be Arranged:  N/A  Follow-Up:   See me in 6 weeks with BMET    At [facility], you and your health needs are our priority.  As part of our continuing mission to provide you with exceptional heart care, we have created designated Provider Care Teams.  These Care Teams include your primary Cardiologist (physician) and Advanced Practice Providers (APPs -  Physician Assistants and Nurse Practitioners) who all work together to provide you with the care you need, when you need it.  Special Instructions:  . Stay safe, stay home, wash your hands for at least 20 seconds and wear a mask when out in public.  . It was good to talk with you today.    Call the [place] office at [phone] if you have any questions, problems or concerns.

## 2019-03-01 ENCOUNTER — Other Ambulatory Visit: Payer: Self-pay | Admitting: Surgery

## 2019-03-01 DIAGNOSIS — R9389 Abnormal findings on diagnostic imaging of other specified body structures: Secondary | ICD-10-CM

## 2019-03-10 ENCOUNTER — Other Ambulatory Visit: Payer: Self-pay

## 2019-03-10 ENCOUNTER — Ambulatory Visit
Admission: RE | Admit: 2019-03-10 | Discharge: 2019-03-10 | Disposition: A | Discharge status: Home / Self Care | Payer: Medicare Other | Source: Ambulatory Visit | Attending: Surgery | Admitting: Surgery

## 2019-03-10 ENCOUNTER — Other Ambulatory Visit (HOSPITAL_COMMUNITY): Payer: Self-pay | Admitting: Diagnostic Radiology

## 2019-03-10 DIAGNOSIS — R9389 Abnormal findings on diagnostic imaging of other specified body structures: Secondary | ICD-10-CM

## 2019-03-10 DIAGNOSIS — N6489 Other specified disorders of breast: Secondary | ICD-10-CM | POA: Diagnosis not present

## 2019-03-10 DIAGNOSIS — N6310 Unspecified lump in the right breast, unspecified quadrant: Secondary | ICD-10-CM | POA: Diagnosis not present

## 2019-03-10 IMAGING — MR MR BREAST BX W/ LOC DEV 1ST LEASION IMAGE BX SPEC MR GUIDE*R*
7 of 10 series · 33 of 48 positions shown · IV contrast (6 ml gadavist)
Comparison: Previous exams.
COMPARISON: Previous exams.

Addendum:
CLINICAL DATA: 78-year-old female for tissue sampling of 0.8 cm
mass in the INNER central RIGHT breast. Recent diagnosis of LEFT
breast cancer.

EXAM:
MRI GUIDED CORE NEEDLE BIOPSY OF THE RIGHT BREAST
TECHNIQUE: Multiplanar, multisequence MR imaging of the RIGHT breast was
performed both before and after administration of intravenous
contrast.
CONTRAST:  6mL GADAVIST GADOBUTROL 1 MMOL/ML IV SOLN

[Series 2: fiducial unilateral · sagittal · 2.0mm · 1.33mm/px · 3 of 52 slices shown]
[im 1/52]
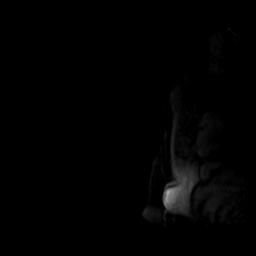
[im 26/52]
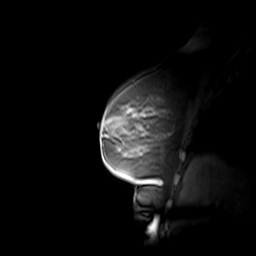
[im 52/52]
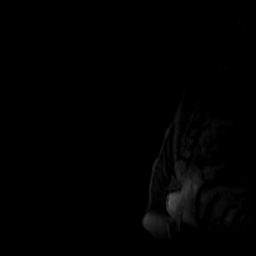

[Series 3: dynamic pre · axial · non-contrast · 1.3mm · 0.73mm/px · z∈[-82,+104]mm · 5 of 144 slices shown]
[im 1/144]
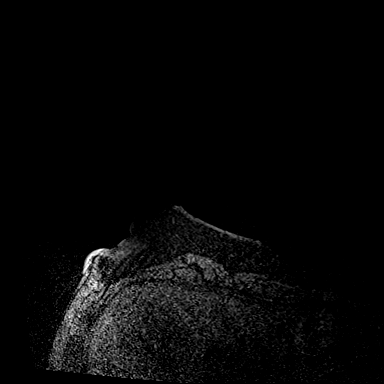
[im 36/144]
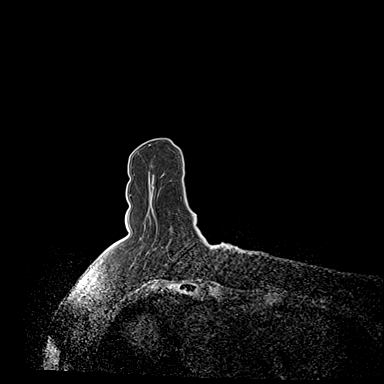
[im 72/144]
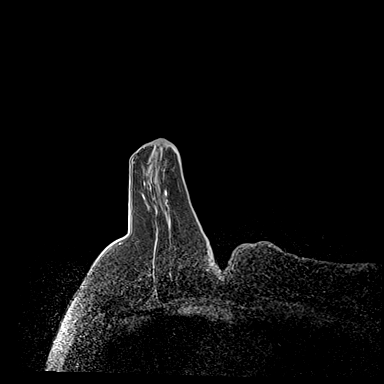
[im 108/144]
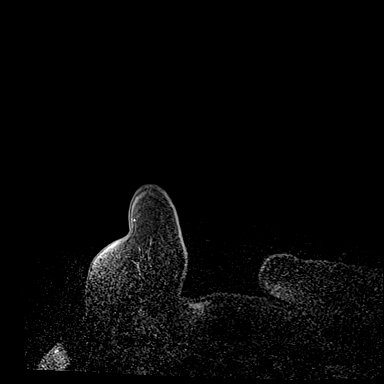
[im 144/144]
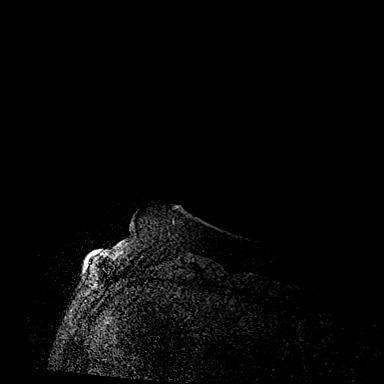

[Series 4: dynamic post 20 · axial · 1.3mm · 0.73mm/px · z∈[-82,+104]mm · 5 of 144 slices shown (1 of 2)]
[im 1/144]
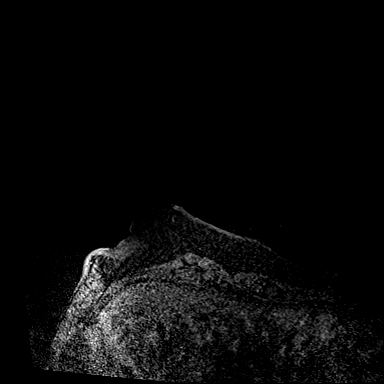
[im 36/144]
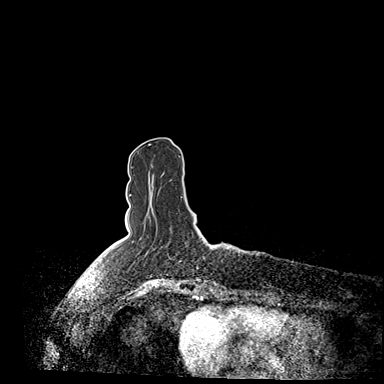
[im 72/144]
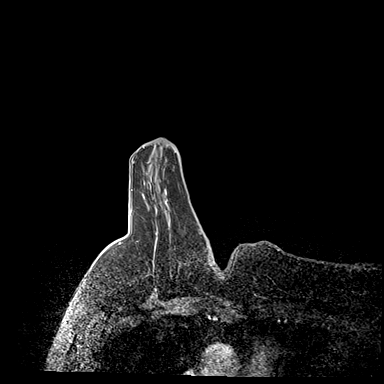
[im 108/144]
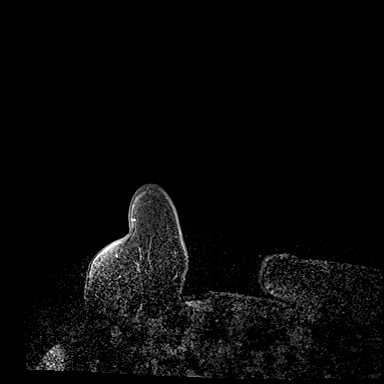
[im 144/144]
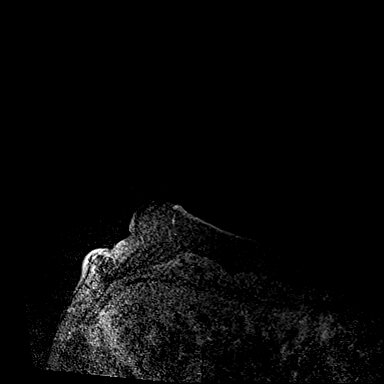

[Series 5: dynamic post 20 · axial · 1.3mm · 0.73mm/px · z∈[-82,+104]mm · 5 of 144 slices shown (2 of 2)]
[im 1/144]
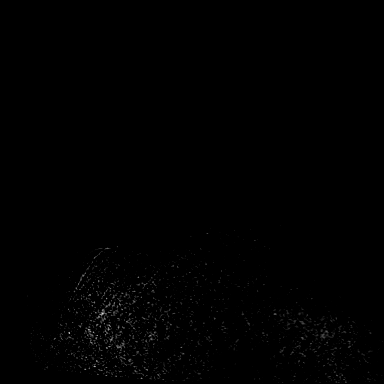
[im 36/144]
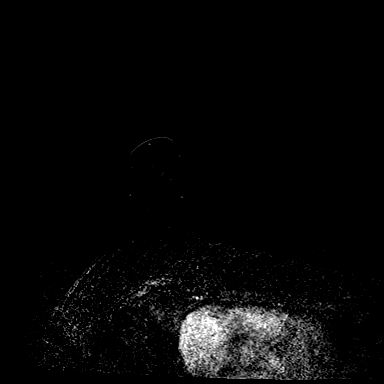
[im 72/144]
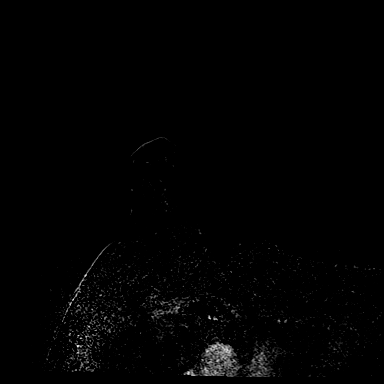
[im 108/144]
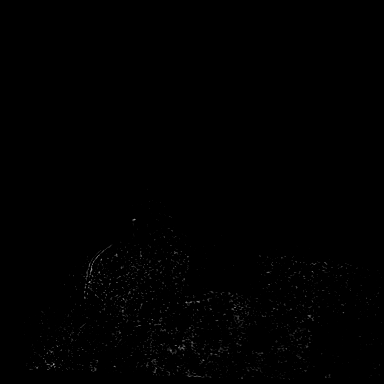
[im 144/144]
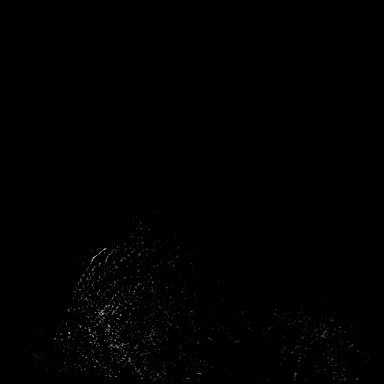

[Series 6: dynamic post 3 · axial · 1.3mm · 0.73mm/px · z∈[-82,+104]mm · 5 of 144 slices shown (1 of 2)]
[im 1/144]
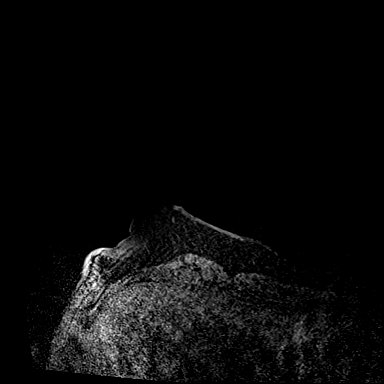
[im 36/144]
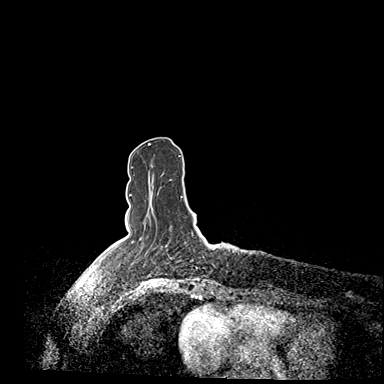
[im 72/144]
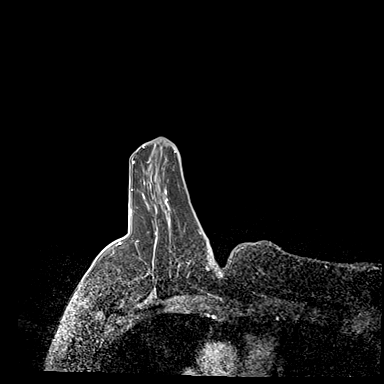
[im 108/144]
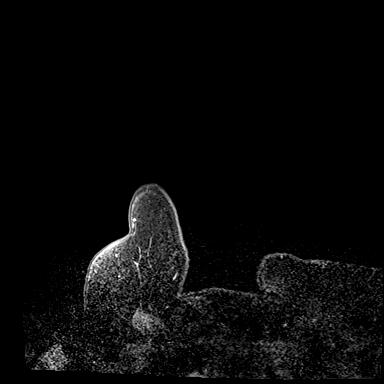
[im 144/144]
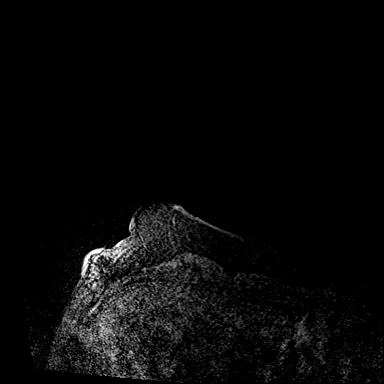

[Series 7: dynamic post 3 · axial · 1.3mm · 0.73mm/px · z∈[-82,+104]mm · 5 of 144 slices shown (2 of 2)]
[im 1/144]
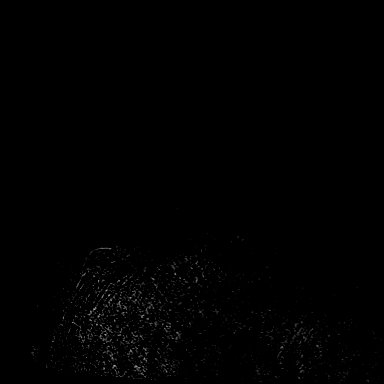
[im 36/144]
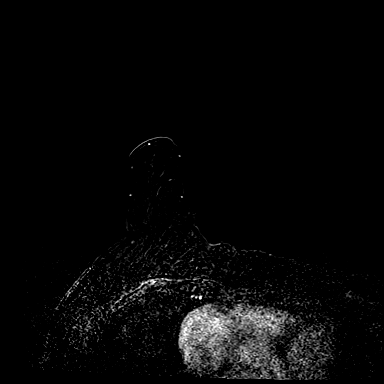
[im 72/144]
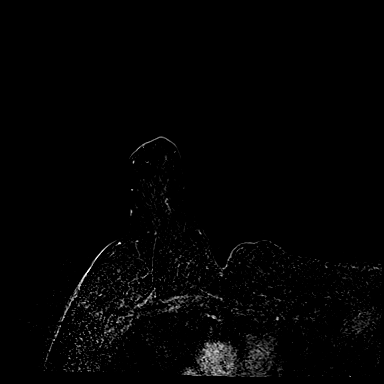
[im 108/144]
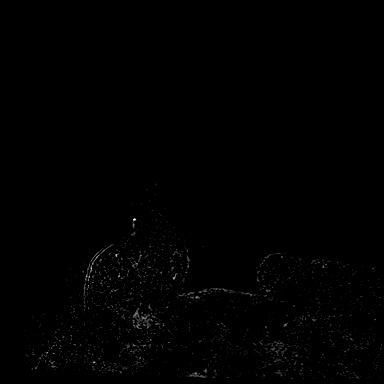
[im 144/144]
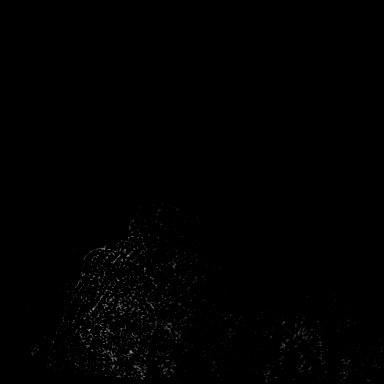

[Series 8: needle confirmation · axial · 1.3mm · 0.73mm/px · z∈[-82,+104]mm · 5 of 144 slices shown]
[im 1/144]
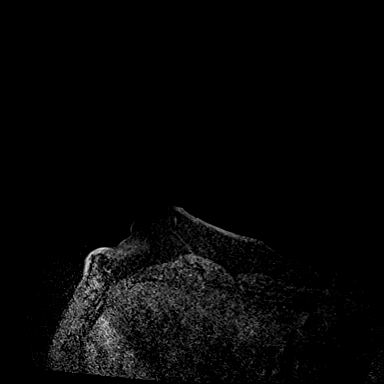
[im 36/144]
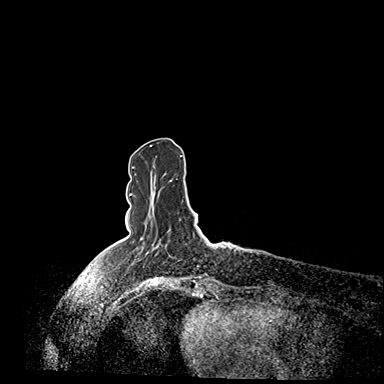
[im 72/144]
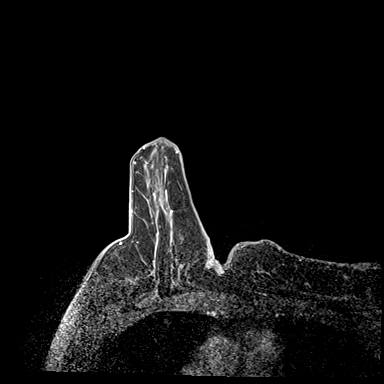
[im 108/144]
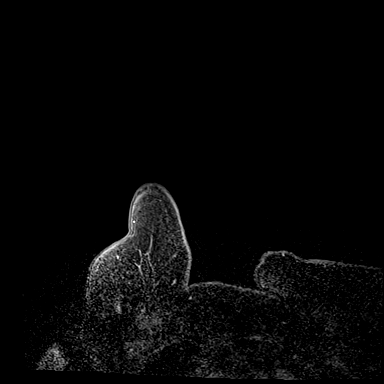
[im 144/144]
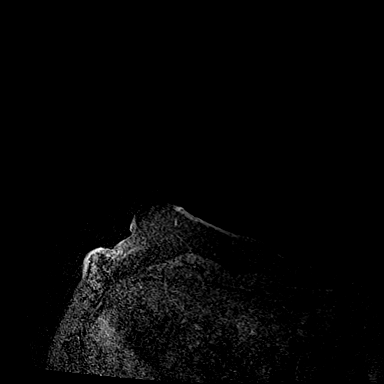

[33 of 48 positions shown; findings below may reference images not displayed]

FINDINGS: I met with the patient, and we discussed the procedure of MRI guided
biopsy, including risks, benefits, and alternatives. Specifically,
we discussed the risks of infection, bleeding, tissue injury, clip
migration, and inadequate sampling. Informed, written consent was
given. The usual time out protocol was performed immediately prior
to the procedure.

Using sterile technique, 1% Lidocaine, MRI guidance, and a 9 gauge
vacuum assisted device, biopsy was performed of the 0.8 cm INNER
central RIGHT breast mass using a LATERAL approach. At the
conclusion of the procedure, a BARBELL tissue marker clip was
deployed into the biopsy cavity.

Follow-up 2-view mammogram was performed and dictated in a separate
report.
IMPRESSION: MRI guided biopsy of 0.8 cm INNER central RIGHT breast mass. No
immediate complication identified.

ADDENDUM:
Pathology revealed LOW GRADE DUCTAL CARCINOMA IN SITU of the RIGHT
breast, central. This was found to be concordant by Dr. LE GRAND.

Pathology results were discussed with the patient by telephone. The
patient reported doing well after the biopsy with tenderness at the
site. Post biopsy instructions and care were reviewed and questions
were answered. The patient was encouraged to call The [REDACTED]

The patient is scheduled to see Dr. LE GRAND [DATE]. A post
neoadjuvant MRI can document the clip position in regard to biopsy
proven RIGHT breast DCIS, given that there was equivocal clip
migration on post biopsy mammogram.

Pathology results reported by LE GRAND, RN on [DATE].

*** End of Addendum ***
FINDINGS: I met with the patient, and we discussed the procedure of MRI guided
biopsy, including risks, benefits, and alternatives. Specifically,
we discussed the risks of infection, bleeding, tissue injury, clip
migration, and inadequate sampling. Informed, written consent was
given. The usual time out protocol was performed immediately prior
to the procedure.

Using sterile technique, 1% Lidocaine, MRI guidance, and a 9 gauge
vacuum assisted device, biopsy was performed of the 0.8 cm INNER
central RIGHT breast mass using a LATERAL approach. At the
conclusion of the procedure, a BARBELL tissue marker clip was
deployed into the biopsy cavity.

Follow-up 2-view mammogram was performed and dictated in a separate
report.
IMPRESSION: MRI guided biopsy of 0.8 cm INNER central RIGHT breast mass. No
immediate complication identified.

## 2019-03-10 IMAGING — MG MM BREAST LOCALIZATION CLIP
4 series · 4 of 12 positions shown · non-contrast
Comparison: Previous exam(s).

CLINICAL DATA: Evaluate BARBELL clip following MR guided RIGHT
breast biopsy.

EXAM:
DIAGNOSTIC RIGHT MAMMOGRAM POST MRI BIOPSY

[R CC synth-2D]
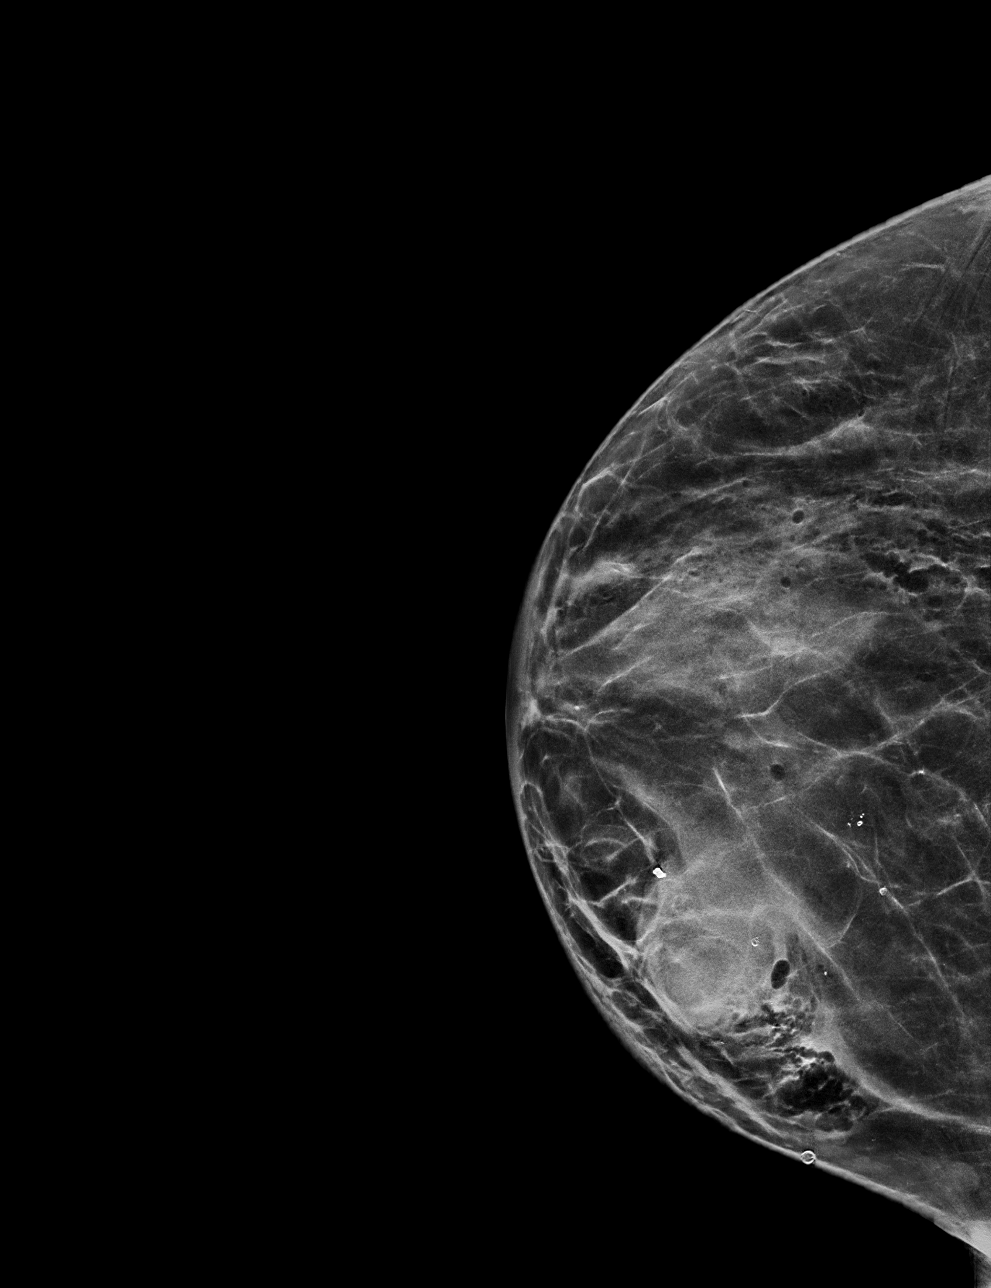

[R ML synth-2D]
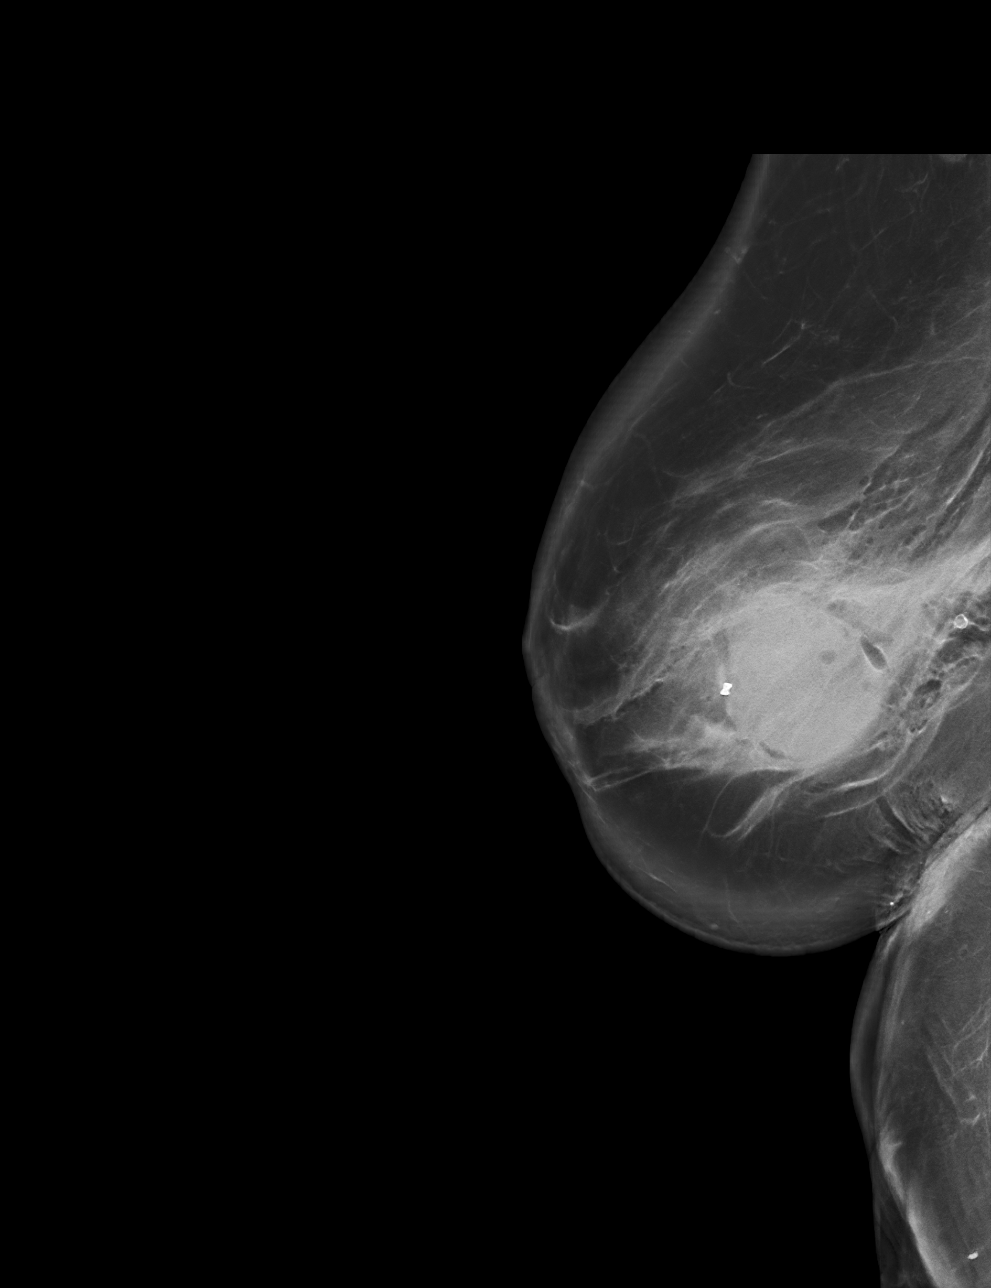

[R CC tomo · tomo slice 31/61.0]
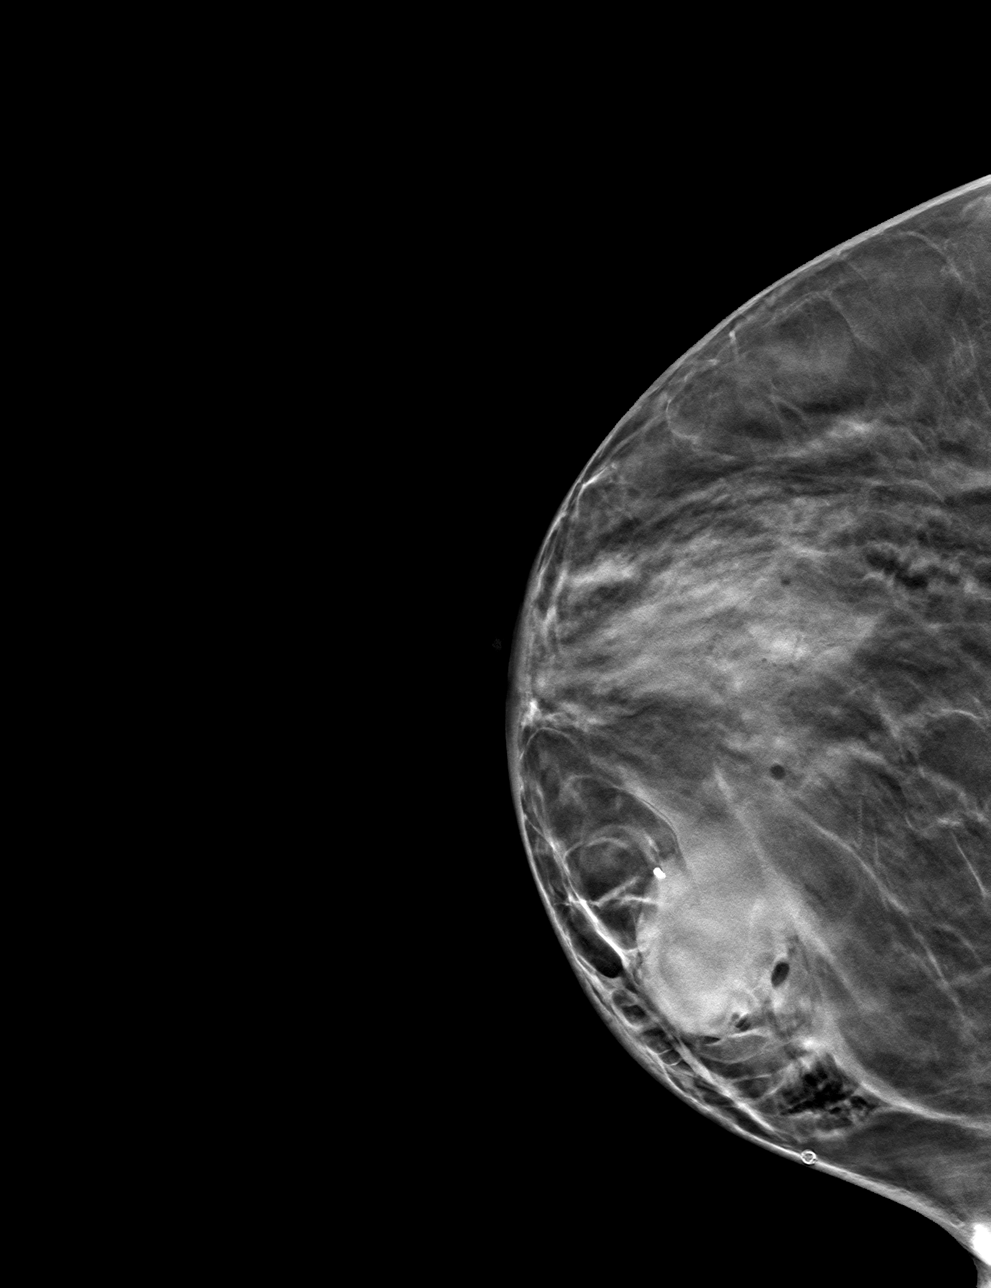

[R ML tomo · tomo slice 50/99.0]
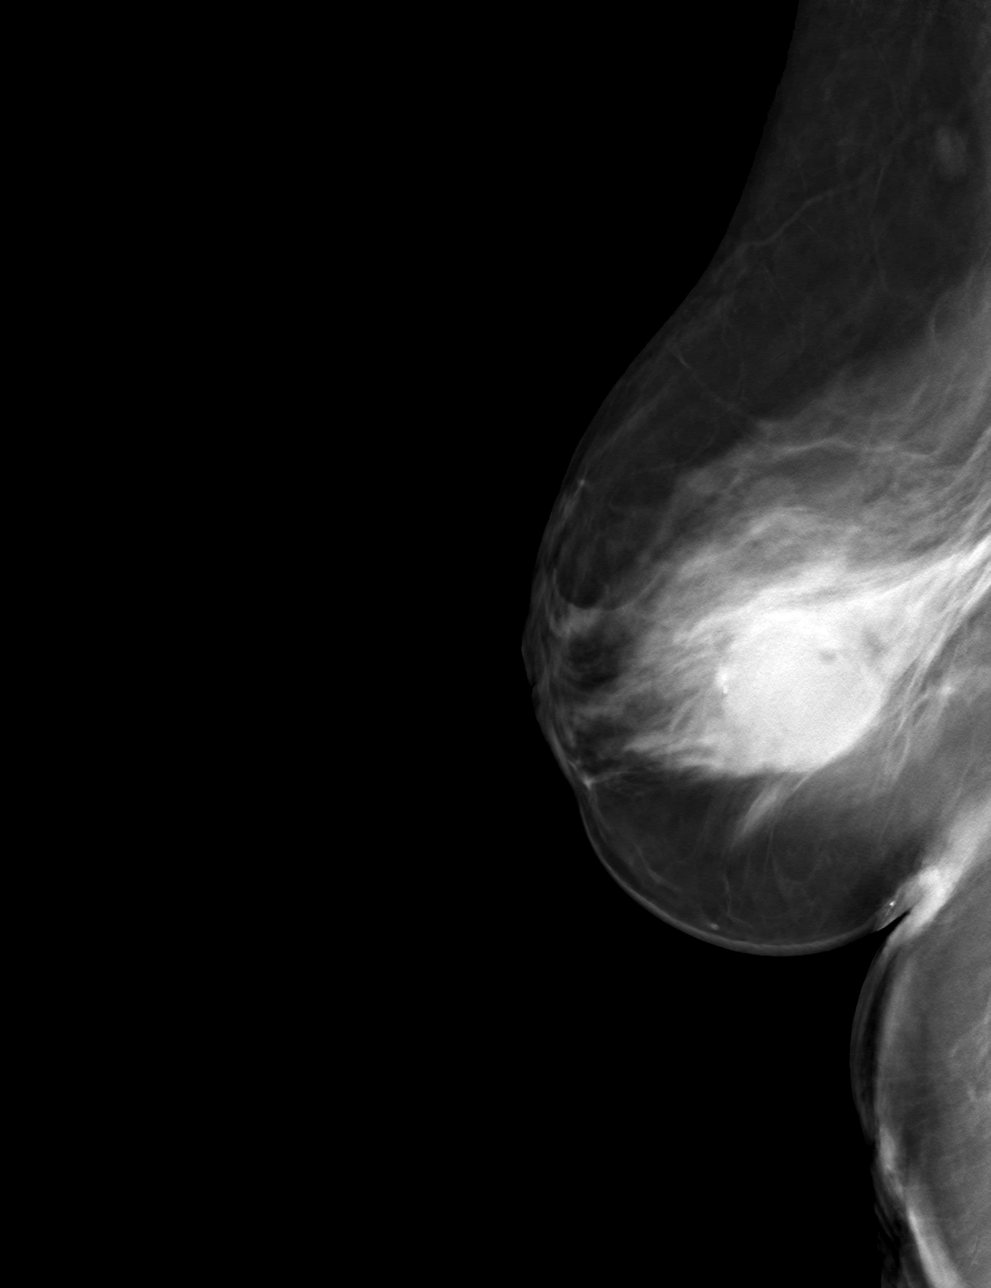

[4 of 12 positions shown; findings below may reference images not displayed]

FINDINGS: 2D/3D 2-view mammogram was performed demonstrating the BARBELL clip
in the INNER RIGHT breast, slightly more MEDIAL than expected from
the MR finding.

A 2.5 cm hematoma within the INNER RIGHT breast is also noted.
IMPRESSION: 2.5 cm INNER RIGHT breast post biopsy hematoma.

The BARBELL biopsy clip appears slightly more MEDIAL than expected
and clip migration is not excluded. If biopsy results require
surgical excision and breast conservation is desired, repeat MRI to
evaluate the expected position of the abnormality and biopsy clip
artifact would be recommended.

## 2019-03-10 MED ORDER — GADOBUTROL 1 MMOL/ML IV SOLN
6.0000 mL | Freq: Once | INTRAVENOUS | Status: AC | PRN
  Administered 2019-03-10: 6 mL via INTRAVENOUS

## 2019-03-16 NOTE — Progress Notes (Signed)
Patient Care Team: Loretha Brasil, FNP as PCP - General (Family Medicine) Mauro Kaufmann, RN as Oncology Nurse Navigator Rockwell Germany, RN as Oncology Nurse Navigator  DIAGNOSIS:    ICD-10-CM   1. Malignant neoplasm of lower-outer quadrant of left breast of female, estrogen receptor positive ([place])  C50.512    Z17.0     SUMMARY OF ONCOLOGIC HISTORY: Oncology History  Malignant neoplasm of lower-outer quadrant of left breast of female, estrogen receptor positive ([place])  01/31/2019 Initial Diagnosis   Palpable lump in the left breast x4 months mammogram 01/27/2019: 2.5 cm mass at 6 o'clock position, 0.7 cm mass retroareolar, axilla negative: Grade 2 IDC ER 100%, PR 100%, HER-2 negative, Ki-67 15% T2N0 stage IB   02/17/2019 Cancer Staging   Staging form: Breast, AJCC 8th Edition - Clinical stage from 02/17/2019: Stage IB (cT2, cN0, cM0, G2, ER+, PR+, HER2-) - Signed by Nicholas Lose, MD on 02/17/2019   02/17/2019 -  Anti-estrogen oral therapy   Neoadjuvant anastrozole '1mg'$  daily     CHIEF COMPLIANT: Follow-up of left breast cancer on anastrozole  INTERVAL HISTORY: Robin Anderson is a 78 y.o. with above-mentioned history of left breast cancer currently on neoadjuvant antiestrogen therapy with anastrozole. She presents to the clinic today for follow-up.   REVIEW OF SYSTEMS:   Constitutional: Denies fevers, chills or abnormal weight loss Eyes: Denies blurriness of vision Ears, nose, mouth, throat, and face: Denies mucositis or sore throat Respiratory: Denies cough, dyspnea or wheezes Cardiovascular: Denies palpitation, chest discomfort Gastrointestinal: Denies nausea, heartburn or change in bowel habits Skin: Denies abnormal skin rashes Lymphatics: Denies new lymphadenopathy or easy bruising Neurological: Denies numbness, tingling or new weaknesses Behavioral/Psych: Mood is stable, no new changes  Extremities: No lower extremity edema Breast: denies any pain or lumps or  nodules in either breasts All other systems were reviewed with the patient and are negative.  I have reviewed the past medical history, past surgical history, social history and family history with the patient and they are unchanged from previous note.  ALLERGIES:  is allergic to penicillins.  MEDICATIONS:  Current Outpatient Medications  Medication Sig Dispense Refill  . anastrozole (ARIMIDEX) 1 MG tablet Take 1 tablet (1 mg total) by mouth daily. 90 tablet 3  . aspirin EC 81 MG tablet Take 1 tablet (81 mg total) by mouth daily.    . benazepril-hydrochlorthiazide (LOTENSIN HCT) 20-25 MG tablet Take 1 tablet by mouth daily. 90 tablet 3  . Multiple Vitamins-Minerals (ICAPS MV PO) Take by mouth daily.    . primidone (MYSOLINE) 50 MG tablet TAKE 3 TABLETS BY MOUTH AT BEDTIME 270 tablet 1  . simvastatin (ZOCOR) 20 MG tablet TAKE 1 TABLET BY MOUTH EVERYDAY AT BEDTIME 90 tablet 3   No current facility-administered medications for this visit.     PHYSICAL EXAMINATION: ECOG PERFORMANCE STATUS: 1 - Symptomatic but completely ambulatory  There were no vitals filed for this visit. There were no vitals filed for this visit.  GENERAL: alert, no distress and comfortable SKIN: skin color, texture, turgor are normal, no rashes or significant lesions EYES: normal, Conjunctiva are pink and non-injected, sclera clear OROPHARYNX: no exudate, no erythema and lips, buccal mucosa, and tongue normal  NECK: supple, thyroid normal size, non-tender, without nodularity LYMPH: no palpable lymphadenopathy in the cervical, axillary or inguinal LUNGS: clear to auscultation and percussion with normal breathing effort HEART: regular rate & rhythm and no murmurs and no lower extremity edema ABDOMEN: abdomen soft, non-tender  and normal bowel sounds MUSCULOSKELETAL: no cyanosis of digits and no clubbing  NEURO: alert & oriented x 3 with fluent speech, no focal motor/sensory deficits EXTREMITIES: No lower extremity  edema  LABORATORY DATA:  I have reviewed the data as listed CMP Latest Ref Rng & Units 01/11/2019 06/06/2018 10/13/2017  Glucose 65 - 99 mg/dL 88 86 -  BUN 8 - 27 mg/dL 15 18 -  Creatinine 0.57 - 1.00 mg/dL 1.13(H) 1.00 -  Sodium 134 - 144 mmol/L 141 142 -  Potassium 3.5 - 5.2 mmol/L 3.6 3.7 -  Chloride 96 - 106 mmol/L 102 101 -  CO2 20 - 29 mmol/L 25 26 -  Calcium 8.7 - 10.3 mg/dL 9.2 9.5 -  Total Protein 6.0 - 8.5 g/dL 7.1 7.3 6.8  Total Bilirubin 0.0 - 1.2 mg/dL 0.2 0.5 0.7  Alkaline Phos 39 - 117 IU/L 102 89 99  AST 0 - 40 IU/L '14 18 16  '$ ALT 0 - 32 IU/L '10 11 10    '$ Lab Results  Component Value Date   WBC 5.7 01/11/2019   HGB 12.0 01/11/2019   HCT 38.8 01/11/2019   MCV 88 01/11/2019   PLT 163 01/11/2019   NEUTROABS 4.2 01/11/2019    ASSESSMENT & PLAN:  Malignant neoplasm of lower-outer quadrant of left breast of female, estrogen receptor positive ([place]) 01/31/2019:Palpable lump in the left breast x4 months mammogram 01/27/2019: 2.5 cm mass at 6 o'clock position, 0.7 cm mass retroareolar, axilla negative: Grade 2 IDC ER 100%, PR 100%, HER-2 negative, Ki-67 15%  T2N0 stage IB 03/10/2019: Biopsy of the right breast: DCIS, 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    Neoadjuvant antiestrogen therapy. 2. Breast conserving surgery followed by 3. Adjuvant radiation therapy followed by 4.  Continuation of adjuvant antiestrogen therapy --------------------------------------------------------------------------------------------------------------------------- Current treatment: Neoadjuvant anastrozole 1 mg daily started 02/17/2019 Anastrozole toxicities: Tolerating it well    Plan to obtain a mammogram and ultrasound at 39-monthpoint to determine response to treatment. She will be on antiestrogen therapy for total of 6 months.  Return to clinic in 2 months with mammogram ultrasound and follow-up    No orders of the defined types were placed in this encounter.  The patient has a  good understanding of the overall plan. she agrees with it. she will call with any problems that may develop before the next visit here.  GNicholas Lose MD 03/17/2019  IJulious OkaDorshimer, am acting as scribe for Dr. VNicholas Lose  I have reviewed the above documentation for accuracy and completeness, and I agree with the above.

## 2019-03-17 ENCOUNTER — Inpatient Hospital Stay (HOSPITAL_BASED_OUTPATIENT_CLINIC_OR_DEPARTMENT_OTHER): Payer: Medicare Other | Admitting: Hematology and Oncology

## 2019-03-17 ENCOUNTER — Other Ambulatory Visit: Payer: Self-pay

## 2019-03-17 DIAGNOSIS — C50512 Malignant neoplasm of lower-outer quadrant of left female breast: Secondary | ICD-10-CM | POA: Diagnosis not present

## 2019-03-17 DIAGNOSIS — Z170 Estrogen receptor positive status [ER+]: Secondary | ICD-10-CM | POA: Diagnosis not present

## 2019-03-17 DIAGNOSIS — Z79811 Long term (current) use of aromatase inhibitors: Secondary | ICD-10-CM | POA: Diagnosis not present

## 2019-03-17 NOTE — Assessment & Plan Note (Signed)
01/31/2019:Palpable lump in the left breast x4 months mammogram 01/27/2019: 2.5 cm mass at 6 o'clock position, 0.7 cm mass retroareolar, axilla negative: Grade 2 IDC ER 100%, PR 100%, HER-2 negative, Ki-67 15%  T2N0 stage IB  Recommendations: 1.  Because she has 2 areas of disease, Dr. Brantley Stage wants [place] to do antiestrogen therapy to decrease the size of the tumor to enable for breast conserving surgery. 2. Breast conserving surgery followed by 3. Adjuvant radiation therapy followed by 4.  Continuation of adjuvant antiestrogen therapy --------------------------------------------------------------------------------------------------------------------------- Current treatment: Neoadjuvant anastrozole 1 mg daily started 02/17/2019 Anastrozole toxicities:  Return to clinic in 2 months with mammogram ultrasound and follow-up

## 2019-03-20 ENCOUNTER — Telehealth: Payer: Self-pay | Admitting: Hematology and Oncology

## 2019-03-20 NOTE — Telephone Encounter (Signed)
I talk with patient regarding schedule  

## 2019-04-05 NOTE — Progress Notes (Signed)
CARDIOLOGY OFFICE NOTE  Date:  04/11/2019    Robin Anderson Date of Birth: 05-Oct-1940 Medical Record [id number]  PCP:  Loretha Brasil, FNP  Cardiologist:  Servando Snare  Chief Complaint  Patient presents with  . Follow-up    History of Present Illness: Robin Anderson is a 78 y.o. female who presents today for a 6 week check.  She is a former patient of Dr. Winnifred Friar. Was to establish with Dr. Cheryll Cockayne never did.She has seen me over the past several years.  She has had atypical chest pain, HTN, HLD and glucose intolerance. Other issues as noted below and include seizure disorder. Remote normal Myoview from 2004 noted.   Has seen me over the past several years - hadstopped her Tegretolat a prior visitand I advised her to alert neurology.She has tended to stop medicines on her own and we have added back.We did a telehealth visit back in May- no real concerns noted. I last saw her last month - she had been found to have breast cancer. Was on anti-estrogen therapy and then would have surgery followed by XRT. BP was up. She was very upset about how she was diagnosed. Cardiac status was ok but BP was up and I increased her Lotensin/HCT.   The patient does not have symptoms concerning for COVID-19 infection (fever, chills, cough, or new shortness of breath).   Comes in today. Here alone. BP has improved. She feels good. No chest pain. Breathing is good. She is staying in. She remains on her anti-estrogen therapy - has follow up later to decide timing of surgery. She has been found to have cancer in both breasts. She is walking - staying active at home. Has no concerns. Needs labs.   Past Medical History:  Diagnosis Date  . Blood in stool   . Chest pain, unspecified   . Family history of breast cancer   . Headache 07/02/2014  . Heart murmur   . Hepatitis   . History of colon polyps   . HLD (hyperlipidemia)   . HTN (hypertension)   . Prediabetes 07/28/2011  .  Seizure disorder [facility])     Past Surgical History:  Procedure Laterality Date  . ABDOMINAL HERNIA REPAIR     lower mid line hernia which has reherniatied  . BUNIONECTOMY Right    traumatic injury to her foot that was repaired  . TONSILLECTOMY    . TUBAL LIGATION       Medications: Current Meds  Medication Sig  . anastrozole (ARIMIDEX) 1 MG tablet Take 1 tablet (1 mg total) by mouth daily.  Marland Kitchen aspirin EC 81 MG tablet Take 1 tablet (81 mg total) by mouth daily.  . benazepril-hydrochlorthiazide (LOTENSIN HCT) 20-25 MG tablet Take 1 tablet by mouth daily.  . calcium carbonate (CALCIUM 600) 600 MG TABS tablet Take 600 mg by mouth daily.  . Multiple Vitamins-Minerals (ICAPS MV PO) Take by mouth daily.  . primidone (MYSOLINE) 50 MG tablet TAKE 3 TABLETS BY MOUTH AT BEDTIME  . simvastatin (ZOCOR) 20 MG tablet TAKE 1 TABLET BY MOUTH EVERYDAY AT BEDTIME  . Vitamin D, Cholecalciferol, 10 MCG (400 UNIT) CAPS Take 1 capsule by mouth daily.     Allergies: Allergies  Allergen Reactions  . Penicillins Rash    Social History: The patient  reports that she has quit smoking. Her smoking use included cigarettes. She has never used smokeless tobacco. She reports that she does not drink alcohol or use drugs.  Family History: The patient's family history includes Breast cancer in her sister; Heart attack in her daughter and father; Hypertension in her brother, mother, and sister.   Review of Systems: Please see the history of present illness.   All other systems are reviewed and negative.   Physical Exam: VS:  BP 138/68   Pulse 65   Ht 5\' 4"  (1.626 m)   Wt 143 lb (64.9 kg)   SpO2 99%   BMI 24.55 kg/m  .  BMI Body mass index is 24.55 kg/m.  Wt Readings from Last 3 Encounters:  04/11/19 143 lb (64.9 kg)  03/17/19 143 lb 12.8 oz (65.2 kg)  02/28/19 145 lb 12.8 oz (66.1 kg)    General: Pleasant. Alert and in no acute distress. She looks good.   HEENT: Normal.  Neck: Supple, no JVD,  carotid bruits, or masses noted.  Cardiac: Regular rate and rhythm. HR is at 60.  No edema.  Respiratory:  Lungs are clear to auscultation bilaterally with normal work of breathing.  GI: Soft and nontender.  MS: No deformity or atrophy. Gait and ROM intact.  Skin: Warm and dry. Color is normal.  Neuro:  Strength and sensation are intact and no gross focal deficits noted.  Psych: Alert, appropriate and with normal affect.   LABORATORY DATA:  EKG:  EKG is not ordered today.  Lab Results  Component Value Date   WBC 5.7 01/11/2019   HGB 12.0 01/11/2019   HCT 38.8 01/11/2019   PLT 163 01/11/2019   GLUCOSE 88 01/11/2019   CHOL 146 10/13/2017   TRIG 46 10/13/2017   HDL 62 10/13/2017   LDLCALC 75 10/13/2017   ALT 10 01/11/2019   AST 14 01/11/2019   NA 141 01/11/2019   K 3.6 01/11/2019   CL 102 01/11/2019   CREATININE 1.13 (H) 01/11/2019   BUN 15 01/11/2019   CO2 25 01/11/2019   TSH 1.880 09/01/2017   HGBA1C 5.8 08/10/2012     BNP (last 3 results) No results for input(s): BNP in the last 8760 hours.  ProBNP (last 3 results) No results for input(s): PROBNP in the last 8760 hours.   Other Studies Reviewed Today:   MRI IMPRESSION of brain 06/2014:  Unremarkable MRI brain (without). Mild diffuse atrophy. No acute findings.    ASSESSMENT & PLAN:   1. HTN - BP is great on her current regimen. Lab today. No further changes made.   2. Newly found breast cancer - has in both breasts - plan per oncology. She will be an acceptable for surgery when scheduled - she has no known CAD and can complete 4 mets of activity.   3. HLD - on statin - lab today.   4. Seizure disorder - on Primidone now. Not discussed.   5. 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 Would follow.   6. COVID-19 Education: The signs and symptoms of COVID-19 were discussed with the patient and how to seek care for testing (follow up  with PCP or arrange E-visit).  The importance of social distancing, staying at home, hand hygiene and wearing a mask when out in public were discussed today.  Current medicines are reviewed with the patient today.  The patient does not have concerns regarding medicines other than what has been noted above.  The following changes have been made:  See above.  Labs/ tests ordered today include:    Orders Placed This Encounter  Procedures  .  Basic metabolic panel  . CBC no Diff  . Hepatic function panel  . Lipid Profile     Disposition:   FU with me  in 6 months.   Patient is agreeable to this plan and will call if any problems develop in the interim.   SignedTruitt Merle, NP  04/11/2019 10:22 AM  [facility] [address] [place], [place]  [address] Phone: [phone] Fax: [phone]

## 2019-04-06 DIAGNOSIS — Z170 Estrogen receptor positive status [ER+]: Secondary | ICD-10-CM | POA: Diagnosis not present

## 2019-04-06 DIAGNOSIS — Z7189 Other specified counseling: Secondary | ICD-10-CM | POA: Diagnosis not present

## 2019-04-06 DIAGNOSIS — I110 Hypertensive heart disease with heart failure: Secondary | ICD-10-CM | POA: Diagnosis not present

## 2019-04-06 DIAGNOSIS — C50512 Malignant neoplasm of lower-outer quadrant of left female breast: Secondary | ICD-10-CM | POA: Diagnosis not present

## 2019-04-06 DIAGNOSIS — R569 Unspecified convulsions: Secondary | ICD-10-CM | POA: Diagnosis not present

## 2019-04-11 ENCOUNTER — Ambulatory Visit (INDEPENDENT_AMBULATORY_CARE_PROVIDER_SITE_OTHER): Payer: Medicare Other | Admitting: Nurse Practitioner

## 2019-04-11 ENCOUNTER — Encounter: Payer: Self-pay | Admitting: Nurse Practitioner

## 2019-04-11 ENCOUNTER — Other Ambulatory Visit: Payer: Self-pay

## 2019-04-11 VITALS — BP 138/68 | HR 65 | Ht 64.0 in | Wt 143.0 lb

## 2019-04-11 DIAGNOSIS — I10 Essential (primary) hypertension: Secondary | ICD-10-CM | POA: Diagnosis not present

## 2019-04-11 DIAGNOSIS — E7800 Pure hypercholesterolemia, unspecified: Secondary | ICD-10-CM

## 2019-04-11 DIAGNOSIS — E785 Hyperlipidemia, unspecified: Secondary | ICD-10-CM | POA: Diagnosis not present

## 2019-04-11 LAB — HEPATIC FUNCTION PANEL
ALT: 7 IU/L (ref 0–32)
AST: 17 IU/L (ref 0–40)
Albumin: 4.4 g/dL (ref 3.7–4.7)
Alkaline Phosphatase: 97 IU/L (ref 39–117)
Bilirubin Total: 0.3 mg/dL (ref 0.0–1.2)
Bilirubin, Direct: 0.1 mg/dL (ref 0.00–0.40)
Total Protein: 7.1 g/dL (ref 6.0–8.5)

## 2019-04-11 LAB — BASIC METABOLIC PANEL
BUN/Creatinine Ratio: 13 (ref 12–28)
BUN: 14 mg/dL (ref 8–27)
CO2: 29 mmol/L (ref 20–29)
Calcium: 9.6 mg/dL (ref 8.7–10.3)
Chloride: 101 mmol/L (ref 96–106)
Creatinine, Ser: 1.05 mg/dL — ABNORMAL HIGH (ref 0.57–1.00)
GFR calc Af Amer: 60 mL/min/{1.73_m2} (ref >59)
GFR calc non Af Amer: 52 mL/min/{1.73_m2} — ABNORMAL LOW (ref >59)
Glucose: 94 mg/dL (ref 65–99)
Potassium: 3.6 mmol/L (ref 3.5–5.2)
Sodium: 141 mmol/L (ref 134–144)

## 2019-04-11 LAB — LIPID PANEL
Chol/HDL Ratio: 2.2 ratio (ref 0.0–4.4)
Cholesterol, Total: 172 mg/dL (ref 100–199)
HDL: 79 mg/dL (ref >39)
LDL Chol Calc (NIH): 81 mg/dL (ref 0–99)
Triglycerides: 59 mg/dL (ref 0–149)
VLDL Cholesterol Cal: 12 mg/dL (ref 5–40)

## 2019-04-11 LAB — CBC
Hematocrit: 37.3 % (ref 34.0–46.6)
Hemoglobin: 11.7 g/dL (ref 11.1–15.9)
MCH: 27.1 pg (ref 26.6–33.0)
MCHC: 31.4 g/dL — ABNORMAL LOW (ref 31.5–35.7)
MCV: 86 fL (ref 79–97)
Platelets: 199 10*3/uL (ref 150–450)
RBC: 4.32 x10E6/uL (ref 3.77–5.28)
RDW: 12.7 % (ref 11.7–15.4)
WBC: 6.1 10*3/uL (ref 3.4–10.8)

## 2019-04-11 NOTE — Patient Instructions (Addendum)
After Visit Summary:  We will be checking the following labs today - BMET, CBC, HPF and lipids   Medication Instructions:    Continue with your current medicines.    If you need a refill on your cardiac medications before your next appointment, please call your pharmacy.     Testing/Procedures To Be Arranged:  N/A  Follow-Up:   See me in 6 months    At [facility], you and your health needs are our priority.  As part of our continuing mission to provide you with exceptional heart care, we have created designated Provider Care Teams.  These Care Teams include your primary Cardiologist (physician) and Advanced Practice Providers (APPs -  Physician Assistants and Nurse Practitioners) who all work together to provide you with the care you need, when you need it.  Special Instructions:  . Stay safe, stay home, wash your hands for at least 20 seconds and wear a mask when out in public.  . It was good to talk with you today.    Call the [place] office at [phone] if you have any questions, problems or concerns.

## 2019-04-25 ENCOUNTER — Encounter: Payer: Self-pay | Admitting: *Deleted

## 2019-05-02 ENCOUNTER — Encounter: Payer: Self-pay | Admitting: *Deleted

## 2019-05-12 ENCOUNTER — Ambulatory Visit
Admission: RE | Admit: 2019-05-12 | Discharge: 2019-05-12 | Disposition: A | Discharge status: Home / Self Care | Payer: Medicare Other | Source: Ambulatory Visit | Attending: Hematology and Oncology | Admitting: Hematology and Oncology

## 2019-05-12 ENCOUNTER — Other Ambulatory Visit: Payer: Self-pay

## 2019-05-12 DIAGNOSIS — C50512 Malignant neoplasm of lower-outer quadrant of left female breast: Secondary | ICD-10-CM

## 2019-05-12 DIAGNOSIS — N6489 Other specified disorders of breast: Secondary | ICD-10-CM | POA: Diagnosis not present

## 2019-05-12 DIAGNOSIS — Z170 Estrogen receptor positive status [ER+]: Secondary | ICD-10-CM

## 2019-05-12 DIAGNOSIS — R928 Other abnormal and inconclusive findings on diagnostic imaging of breast: Secondary | ICD-10-CM | POA: Diagnosis not present

## 2019-05-12 IMAGING — MG DIGITAL DIAGNOSTIC BILAT W/ TOMO W/ CAD
8 series · 8 of 24 positions shown · non-contrast
Comparison: Previous exam(s).

CLINICAL DATA: 78-year-old with biopsy-proven multicentric invasive
ductal carcinoma involving the LEFT breast (12 o'clock subareolar
location and 6 o'clock location at POSTERIOR depth), biopsy-proven
low-grade DCIS involving the INNER RIGHT breast at MIDDLE depth,
currently undergoing neoadjuvant hormonal chemoprevention with
anastrozole. Evaluate response to treatment.

EXAM:
DIGITAL DIAGNOSTIC BILATERAL MAMMOGRAM WITH CAD AND TOMO
ULTRASOUND LEFT BREAST

[L CC synth-2D]
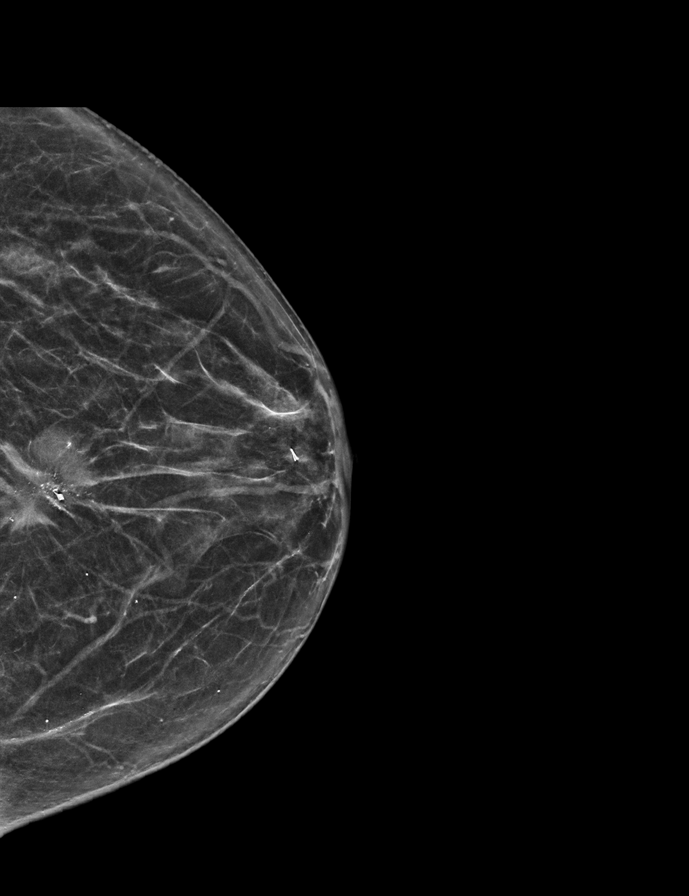

[R MLO synth-2D]
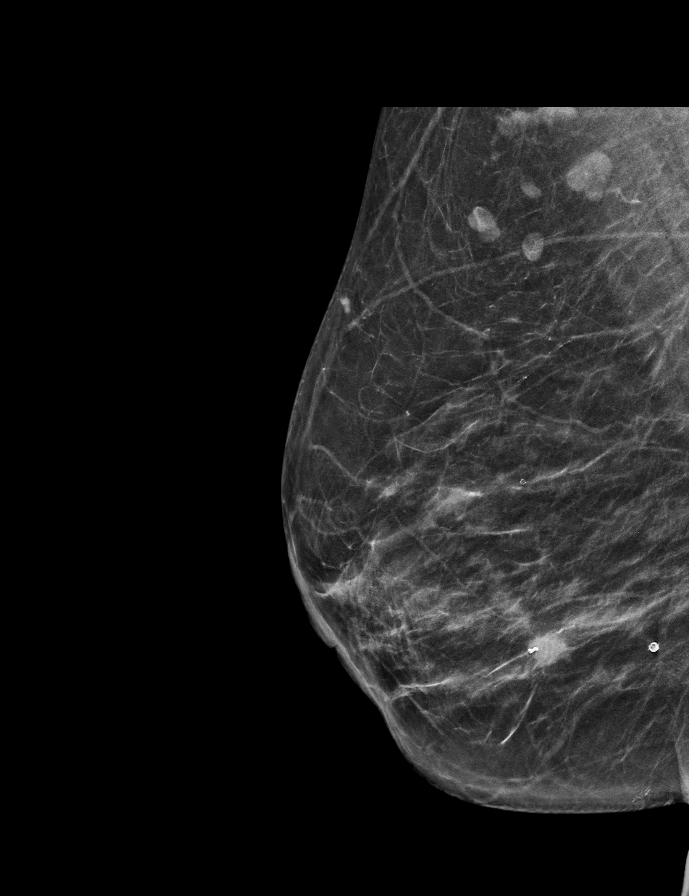

[R CC synth-2D]
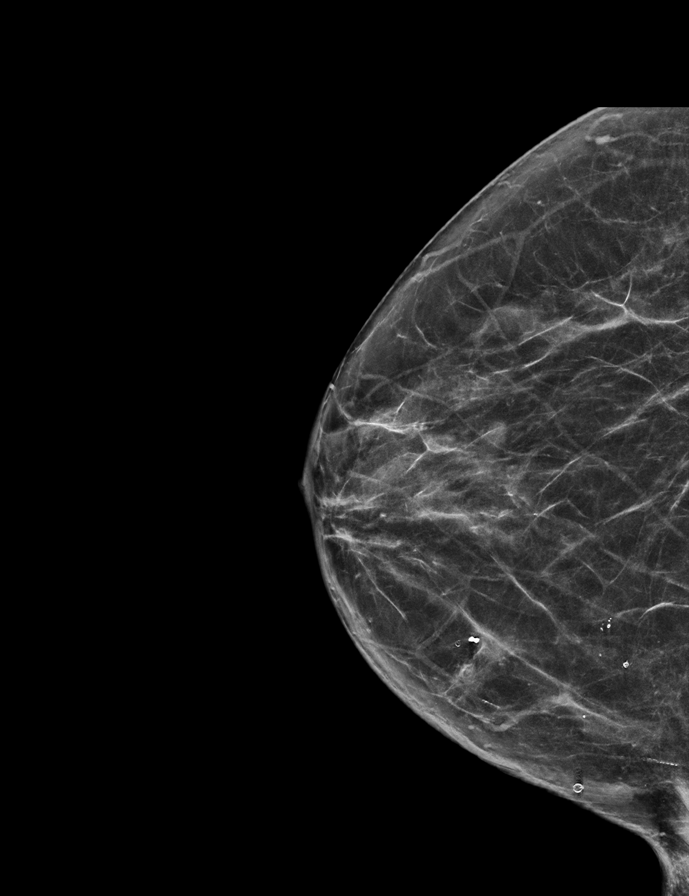

[L MLO synth-2D]
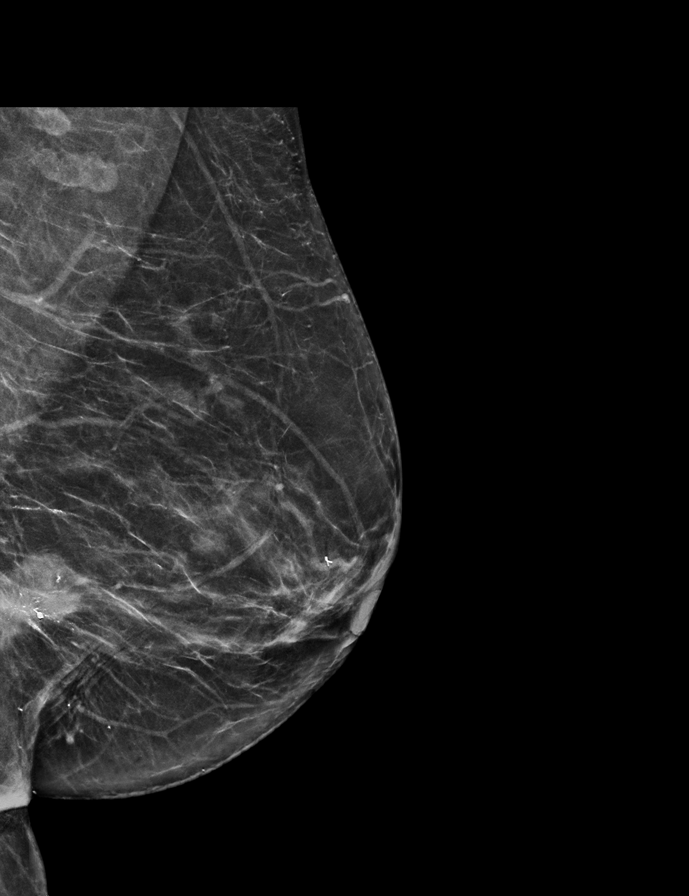

[R CC tomo · tomo slice 33/65.0]
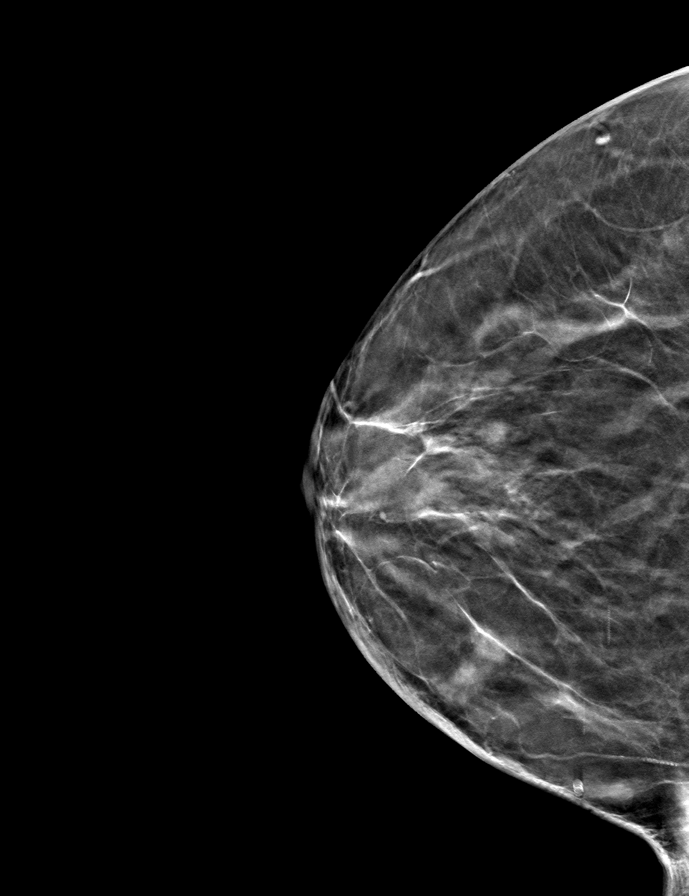

[L CC tomo · tomo slice 29/57.0]
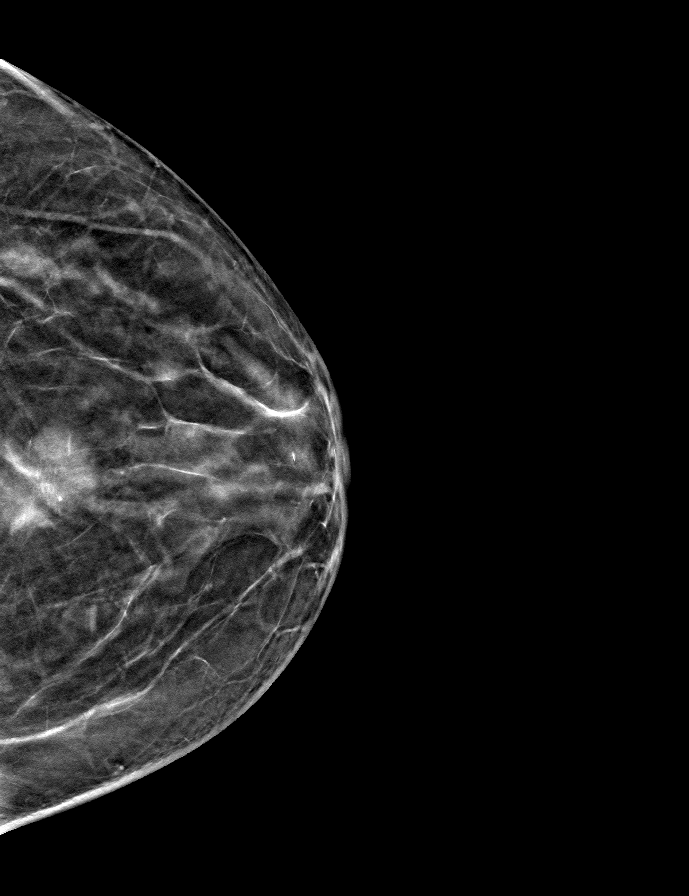

[L MLO tomo · tomo slice 32/63.0]
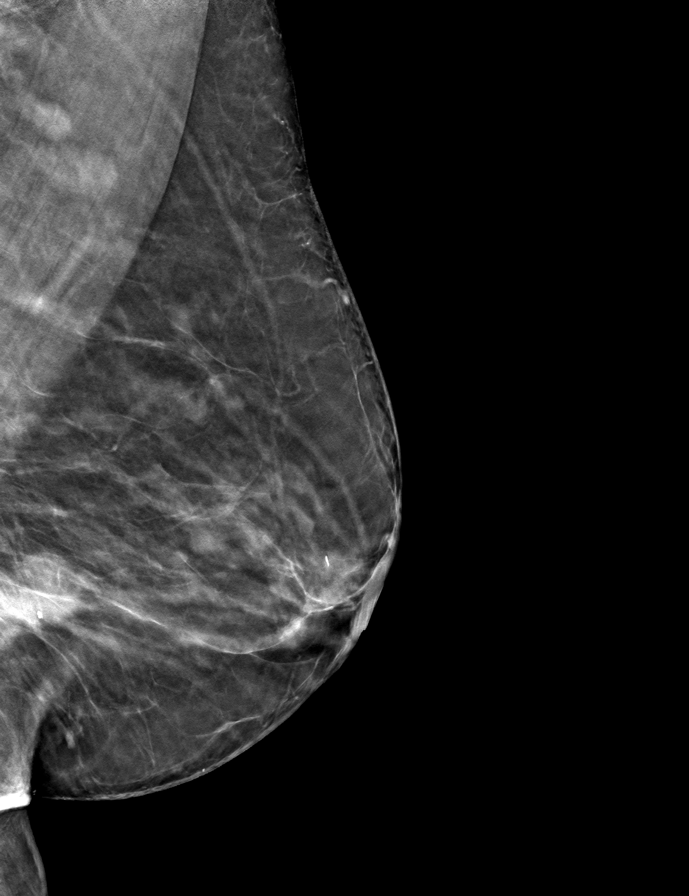

[R MLO tomo · tomo slice 31/62.0]
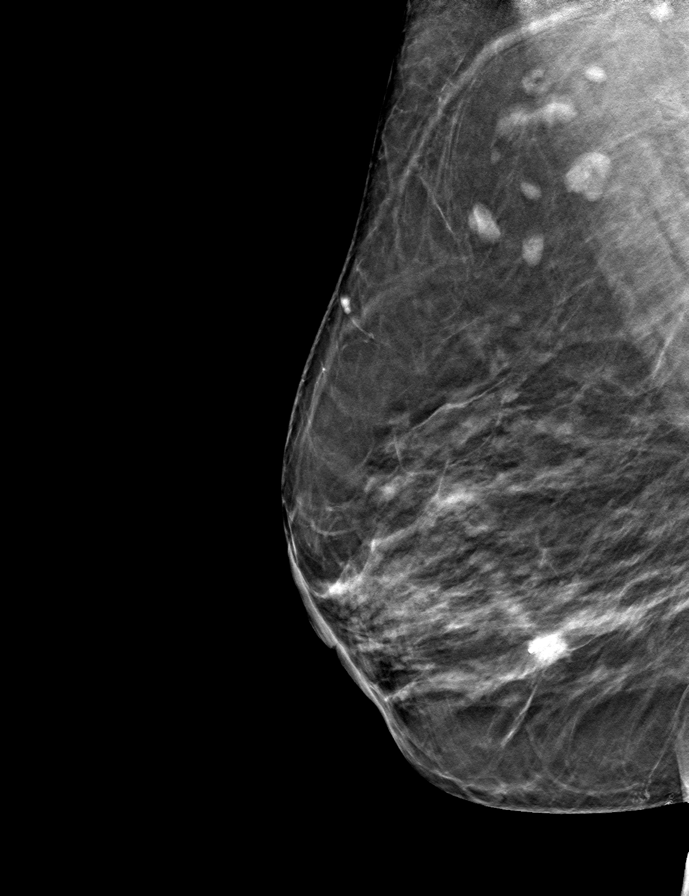

[8 of 24 positions shown; findings below may reference images not displayed]

ACR Breast Density Category b: There are scattered areas of
fibroglandular density.
FINDINGS: Tomosynthesis and synthesized full field CC and MLO views of both
breasts were obtained.

Biopsy-proven invasive ductal carcinoma involving the LOWER LEFT
breast at POSTERIOR depth, associated with the coil shaped tissue
marker clip, with suspicious calcifications, unchanged mammographic
appearance since the [DATE] examination. The posterior margin
of the mass is incompletely imaged on mammography, and the size will
be compared on the concurrent ultrasound described below.

Interval reduction in size of the biopsy-proven invasive ductal
carcinoma involving the UPPER subareolar LEFT breast, associated
with the ribbon shaped tissue marker clip. There are scattered
residual calcifications at biopsy site.

No new or suspicious findings elsewhere in LEFT breast.

Biopsy-proven low-grade DCIS involving the INNER RIGHT breast at
MIDDLE depth, associated with the barbell shaped tissue marker clip,
with adjacent post biopsy change. Marked reduction in size of the
post biopsy hematoma present on the [DATE] examination. The
low-grade DCIS was mammographically occult, as there were no
suspicious calcifications or other findings in the RIGHT breast. No
new or suspicious findings elsewhere in the RIGHT breast.

Mammographic images were processed with CAD.

Targeted LEFT breast ultrasound is performed, showing the
biopsy-proven invasive ductal carcinoma at the 6 o'clock position
approximately 4 cm from the nipple, associated with calcifications,
measuring approximately 1.5 x 2.3 x 2.0 cm (previously 1.6 x 2.5 x
2.3 cm). The echogenic tissue marker clip is visible within the
mass.

Biopsy-proven invasive ductal carcinoma at the 12 o'clock subareolar
location currently measures approximately 0.2 x 0.5 x 0.7 cm
(previously 0.5 x 0.7 x 0.7 cm). The echogenic tissue marker clip is
visible within the mass.
IMPRESSION: 1. Stable to slight decrease in size of the biopsy-proven invasive
ductal carcinoma involving the LOWER LEFT breast at the 6 o'clock
location since the prior examinations in [DATE]. Measurements
are given above.
2. Decrease in size of the biopsy-proven invasive ductal carcinoma
involving the UPPER subareolar LEFT breast at the 12 o'clock
location since the prior examinations in [DATE]. Measurements
are given above.
3. Near complete resolution of the post biopsy hematoma involving
the RIGHT breast. The biopsy-proven low-grade DCIS in the RIGHT
breast has no mammographic correlate.
4. No new or suspicious findings elsewhere in either breast.

RECOMMENDATION:
Treatment plan.

I have discussed the findings and recommendations with the patient.
If applicable, a reminder letter will be sent to the patient
regarding the next appointment.

BI-RADS CATEGORY  6: Known biopsy-proven malignancy.

## 2019-05-12 IMAGING — US US BREAST*L* LIMITED INC AXILLA
1 series · 12 of 14 positions shown · non-contrast
Comparison: Previous exam(s).

CLINICAL DATA: 78-year-old with biopsy-proven multicentric invasive
ductal carcinoma involving the LEFT breast (12 o'clock subareolar
location and 6 o'clock location at POSTERIOR depth), biopsy-proven
low-grade DCIS involving the INNER RIGHT breast at MIDDLE depth,
currently undergoing neoadjuvant hormonal chemoprevention with
anastrozole. Evaluate response to treatment.

EXAM:
DIGITAL DIAGNOSTIC BILATERAL MAMMOGRAM WITH CAD AND TOMO
ULTRASOUND LEFT BREAST

[Series 1: us breast*left* limited inc axilla · 0.06mm/px · 12 of 14 slices shown]
[im 1/14]
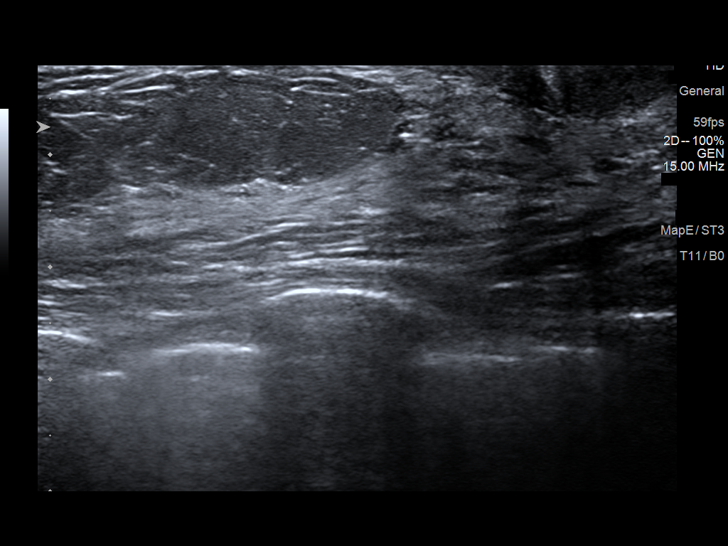
[im 2/14]
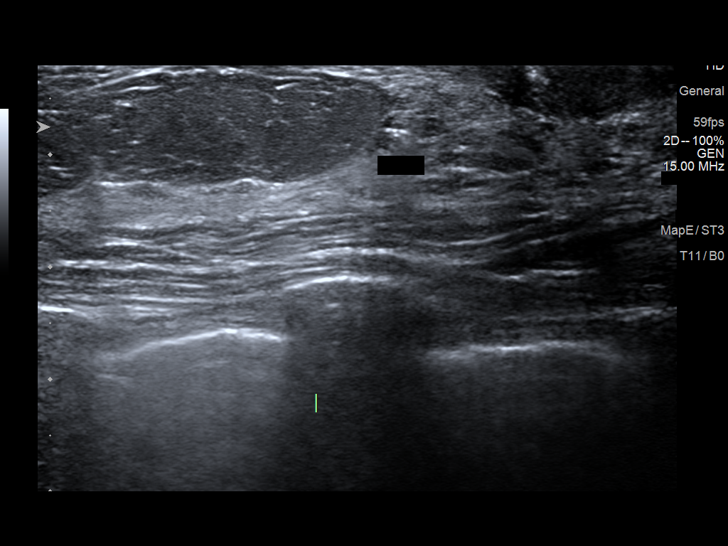
[im 3/14]
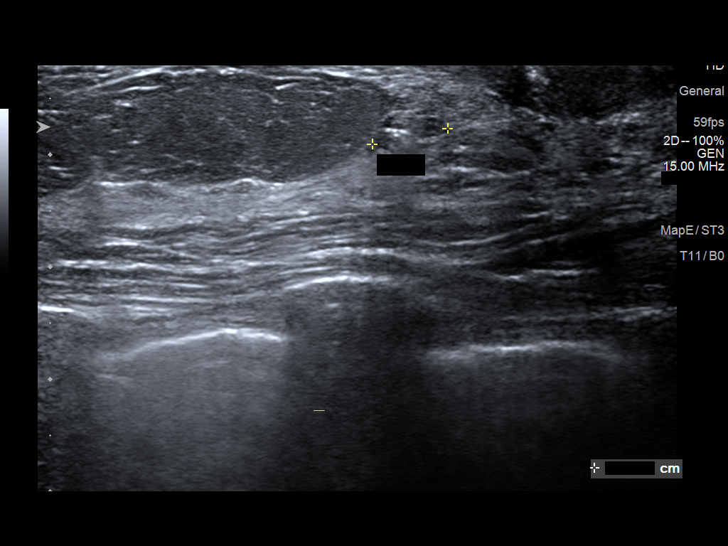
[im 5/14]
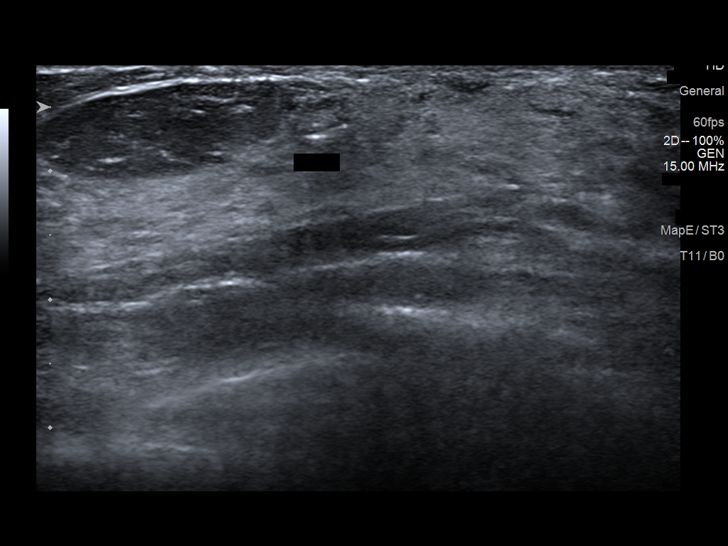
[im 6/14]
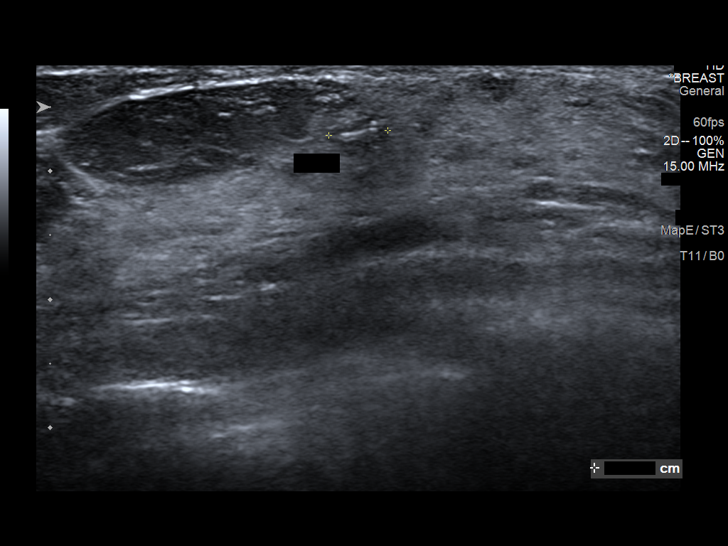
[im 7/14]
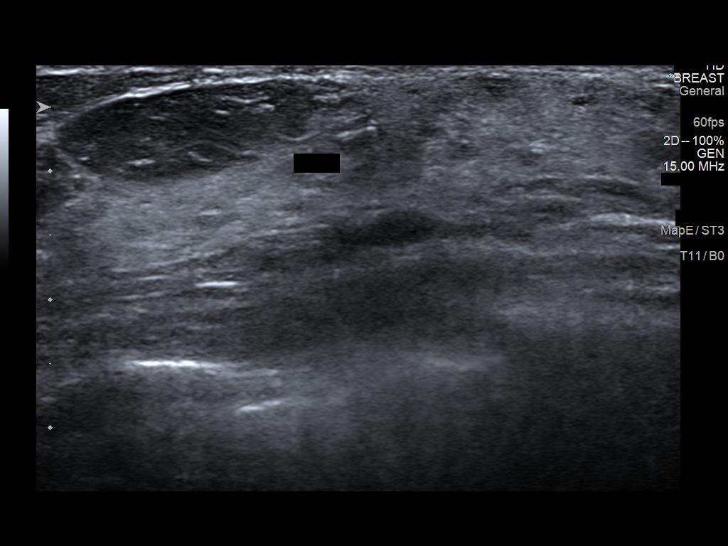
[im 8/14]
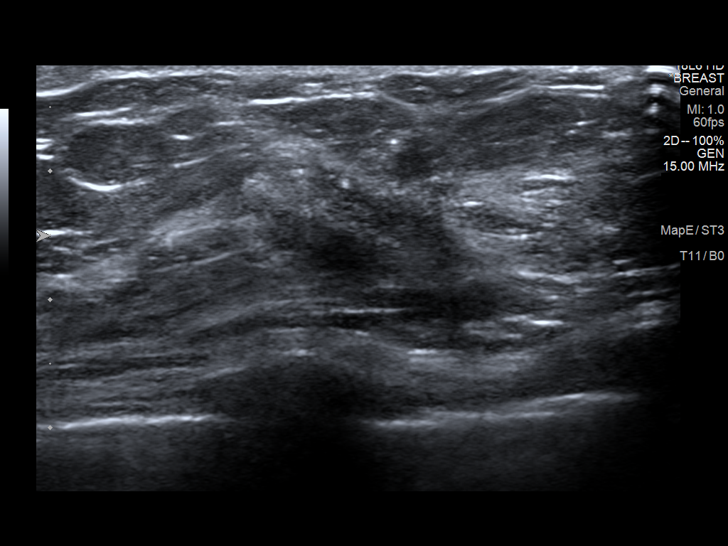
[im 9/14]
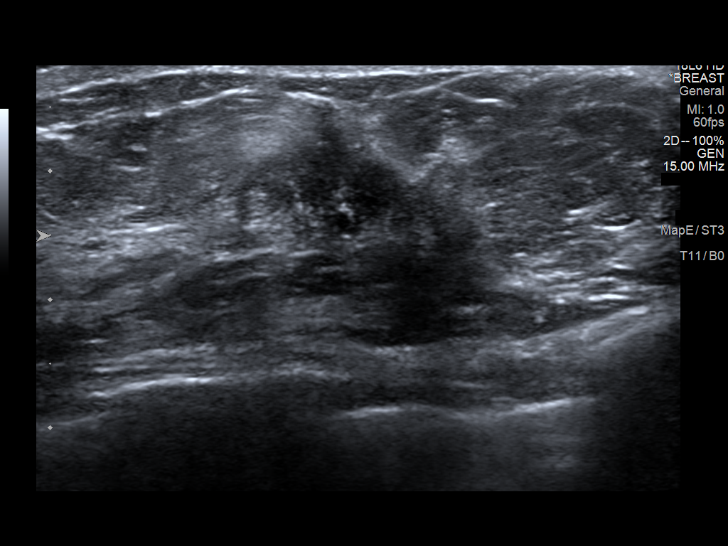
[im 10/14]
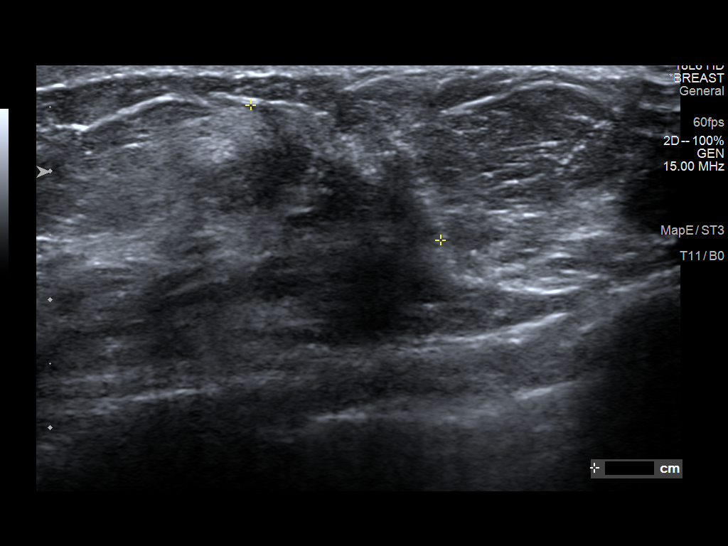
[im 12/14]
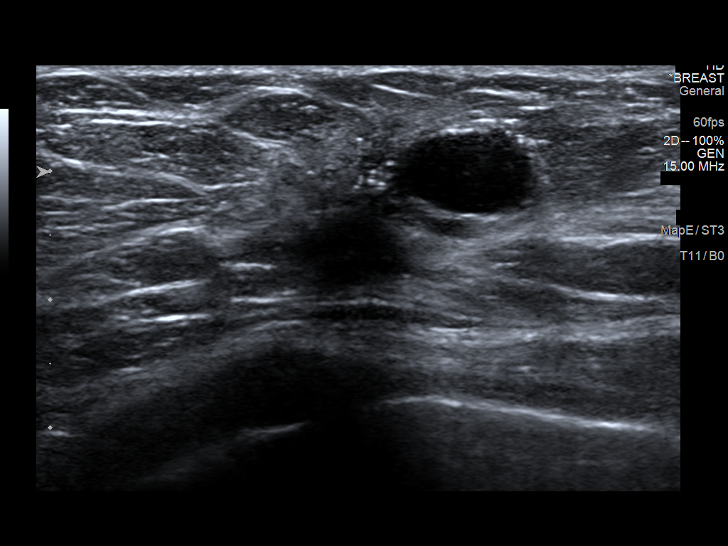
[im 13/14]
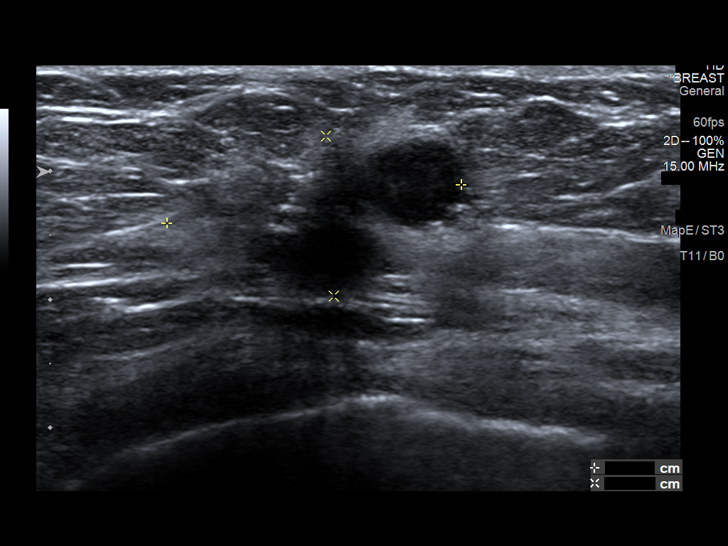
[im 14/14]
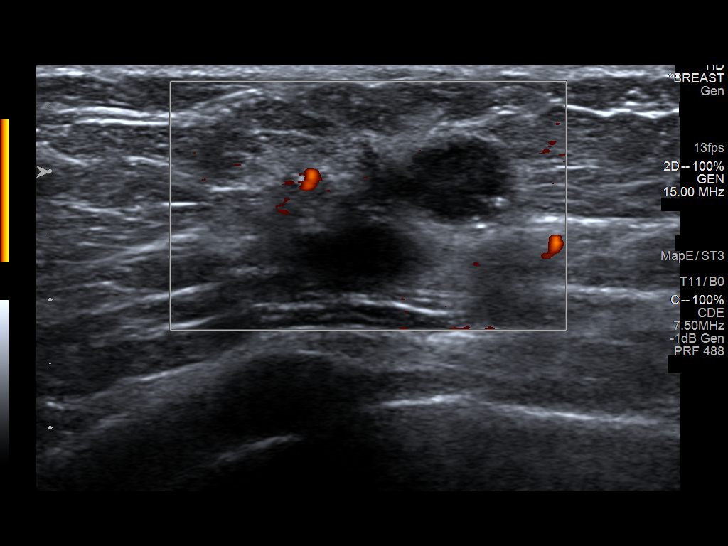

[12 of 14 positions shown; findings below may reference images not displayed]

ACR Breast Density Category b: There are scattered areas of
fibroglandular density.
FINDINGS: Tomosynthesis and synthesized full field CC and MLO views of both
breasts were obtained.

Biopsy-proven invasive ductal carcinoma involving the LOWER LEFT
breast at POSTERIOR depth, associated with the coil shaped tissue
marker clip, with suspicious calcifications, unchanged mammographic
appearance since the [DATE] examination. The posterior margin
of the mass is incompletely imaged on mammography, and the size will
be compared on the concurrent ultrasound described below.

Interval reduction in size of the biopsy-proven invasive ductal
carcinoma involving the UPPER subareolar LEFT breast, associated
with the ribbon shaped tissue marker clip. There are scattered
residual calcifications at biopsy site.

No new or suspicious findings elsewhere in LEFT breast.

Biopsy-proven low-grade DCIS involving the INNER RIGHT breast at
MIDDLE depth, associated with the barbell shaped tissue marker clip,
with adjacent post biopsy change. Marked reduction in size of the
post biopsy hematoma present on the [DATE] examination. The
low-grade DCIS was mammographically occult, as there were no
suspicious calcifications or other findings in the RIGHT breast. No
new or suspicious findings elsewhere in the RIGHT breast.

Mammographic images were processed with CAD.

Targeted LEFT breast ultrasound is performed, showing the
biopsy-proven invasive ductal carcinoma at the 6 o'clock position
approximately 4 cm from the nipple, associated with calcifications,
measuring approximately 1.5 x 2.3 x 2.0 cm (previously 1.6 x 2.5 x
2.3 cm). The echogenic tissue marker clip is visible within the
mass.

Biopsy-proven invasive ductal carcinoma at the 12 o'clock subareolar
location currently measures approximately 0.2 x 0.5 x 0.7 cm
(previously 0.5 x 0.7 x 0.7 cm). The echogenic tissue marker clip is
visible within the mass.
IMPRESSION: 1. Stable to slight decrease in size of the biopsy-proven invasive
ductal carcinoma involving the LOWER LEFT breast at the 6 o'clock
location since the prior examinations in [DATE]. Measurements
are given above.
2. Decrease in size of the biopsy-proven invasive ductal carcinoma
involving the UPPER subareolar LEFT breast at the 12 o'clock
location since the prior examinations in [DATE]. Measurements
are given above.
3. Near complete resolution of the post biopsy hematoma involving
the RIGHT breast. The biopsy-proven low-grade DCIS in the RIGHT
breast has no mammographic correlate.
4. No new or suspicious findings elsewhere in either breast.

RECOMMENDATION:
Treatment plan.

I have discussed the findings and recommendations with the patient.
If applicable, a reminder letter will be sent to the patient
regarding the next appointment.

BI-RADS CATEGORY  6: Known biopsy-proven malignancy.

## 2019-05-15 ENCOUNTER — Encounter: Payer: Self-pay | Admitting: *Deleted

## 2019-05-15 NOTE — Progress Notes (Signed)
Patient Care Team: Loretha Brasil, FNP as PCP - General (Family Medicine) Mauro Kaufmann, RN as Oncology Nurse Navigator Rockwell Germany, RN as Oncology Nurse Navigator  DIAGNOSIS:    ICD-10-CM   1. Malignant neoplasm of lower-outer quadrant of left breast of female, estrogen receptor positive ([place])  C50.512    Z17.0     SUMMARY OF ONCOLOGIC HISTORY: Oncology History  Malignant neoplasm of lower-outer quadrant of left breast of female, estrogen receptor positive ([employer])  01/31/2019 Initial Diagnosis   Palpable lump in the left breast x4 months mammogram 01/27/2019: 2.5 cm mass at 6 o'clock position, 0.7 cm mass retroareolar, axilla negative: Grade 2 IDC ER 100%, PR 100%, HER-2 negative, Ki-67 15% T2N0 stage IB   02/17/2019 Cancer Staging   Staging form: Breast, AJCC 8th Edition - Clinical stage from 02/17/2019: Stage IB (cT2, cN0, cM0, G2, ER+, PR+, HER2-) - Signed by Nicholas Lose, MD on 02/17/2019   02/17/2019 -  Anti-estrogen oral therapy   Neoadjuvant anastrozole '1mg'$  daily     CHIEF COMPLIANT: Follow-up of left breast cancer on anastrozole  INTERVAL HISTORY: Robin Anderson is a 79 y.o. with above-mentioned history of left breast cancer currently on neoadjuvant antiestrogen therapy with anastrozole. Mammogram and [place] on 05/12/19 showed stable to slight decrease in size of the lower left breast mass, decrease in size of the upper left breast mass, and no other suspicious findings. She presents to the clinic today for follow-up.   She reports no problems tolerating the anastrozole.  She tells me that she cannot feel the lump anymore in the breast.  ALLERGIES:  is allergic to penicillins.  MEDICATIONS:  Current Outpatient Medications  Medication Sig Dispense Refill  . anastrozole (ARIMIDEX) 1 MG tablet Take 1 tablet (1 mg total) by mouth daily. 90 tablet 3  . aspirin EC 81 MG tablet Take 1 tablet (81 mg total) by mouth daily.    . benazepril-hydrochlorthiazide (LOTENSIN  HCT) 20-25 MG tablet Take 1 tablet by mouth daily. 90 tablet 3  . calcium carbonate (CALCIUM 600) 600 MG TABS tablet Take 600 mg by mouth daily.    . Multiple Vitamins-Minerals (ICAPS MV PO) Take by mouth daily.    . primidone (MYSOLINE) 50 MG tablet TAKE 3 TABLETS BY MOUTH AT BEDTIME 270 tablet 1  . simvastatin (ZOCOR) 20 MG tablet TAKE 1 TABLET BY MOUTH EVERYDAY AT BEDTIME 90 tablet 3  . Vitamin D, Cholecalciferol, 10 MCG (400 UNIT) CAPS Take 1 capsule by mouth daily.     No current facility-administered medications for this visit.    PHYSICAL EXAMINATION: ECOG PERFORMANCE STATUS: 1 - Symptomatic but completely ambulatory  Vitals:   05/16/19 0923  BP: (!) 162/71  Pulse: 70  Resp: 17  Temp: 98.6 F (37 C)  SpO2: 100%   Filed Weights   05/16/19 0923  Weight: 144 lb 1.6 oz (65.4 kg)    LABORATORY DATA:  I have reviewed the data as listed CMP Latest Ref Rng & Units 04/11/2019 01/11/2019 06/06/2018  Glucose 65 - 99 mg/dL 94 88 86  BUN 8 - 27 mg/dL '14 15 18  '$ Creatinine 0.57 - 1.00 mg/dL 1.05(H) 1.13(H) 1.00  Sodium 134 - 144 mmol/L 141 141 142  Potassium 3.5 - 5.2 mmol/L 3.6 3.6 3.7  Chloride 96 - 106 mmol/L 101 102 101  CO2 20 - 29 mmol/L '29 25 26  '$ Calcium 8.7 - 10.3 mg/dL 9.6 9.2 9.5  Total Protein 6.0 - 8.5 g/dL 7.1 7.1  7.3  Total Bilirubin 0.0 - 1.2 mg/dL 0.3 0.2 0.5  Alkaline Phos 39 - 117 IU/L 97 102 89  AST 0 - 40 IU/L '17 14 18  '$ ALT 0 - 32 IU/L '7 10 11    '$ Lab Results  Component Value Date   WBC 6.1 04/11/2019   HGB 11.7 04/11/2019   HCT 37.3 04/11/2019   MCV 86 04/11/2019   PLT 199 04/11/2019   NEUTROABS 4.2 01/11/2019    ASSESSMENT & PLAN:  Malignant neoplasm of lower-outer quadrant of left breast of female, estrogen receptor positive ([place]) 01/31/2019:Palpable lump in the left breast x4 months mammogram 01/27/2019: 2.5 cm mass at 6 o'clock position, 0.7 cm mass retroareolar, axilla negative: Grade 2 IDC ER 100%, PR 100%, HER-2 negative, Ki-67 15%  T2N0 stage  IB 03/10/2019: Biopsy of the right breast: DCIS, 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  Neoadjuvant antiestrogen therapy. 2.Breast conserving surgery followed by 3. Adjuvant radiation therapy followed by 4.Continuation of adjuvant antiestrogen therapy --------------------------------------------------------------------------------------------------------------------------- Current treatment: Neoadjuvant anastrozole 1 mg daily started 02/17/2019 Anastrozole toxicities: Tolerating it well    Mammogram and ultrasound 05/12/2019: Stable to slight decrease in size of the left breast cancer 2.3 cm decreased from 2.5 cm.  Subareolar tumor 0.7 cm, stable She will be on antiestrogen therapy for total of 6 months and then will perform breast MRI.  Return in 3 months with breast MRI and follow-up I sent a message for her to be presented in the tumor board after the MRI.  No orders of the defined types were placed in this encounter.  The patient has a good understanding of the overall plan. she agrees with it. she will call with any problems that may develop before the next visit here.  Total time spent: 30 mins including face to face time and time spent for planning, charting and coordination of care  Nicholas Lose, MD 05/16/2019  I, Cloyde Reams Dorshimer, am acting as scribe for Dr. Nicholas Lose.  I have reviewed the above documentation for accuracy and completeness, and I agree with the above.

## 2019-05-16 ENCOUNTER — Encounter: Payer: Self-pay | Admitting: *Deleted

## 2019-05-16 ENCOUNTER — Inpatient Hospital Stay: Payer: Medicare Other | Attending: Hematology and Oncology | Admitting: Hematology and Oncology

## 2019-05-16 ENCOUNTER — Other Ambulatory Visit: Payer: Self-pay

## 2019-05-16 DIAGNOSIS — Z170 Estrogen receptor positive status [ER+]: Secondary | ICD-10-CM

## 2019-05-16 DIAGNOSIS — Z79811 Long term (current) use of aromatase inhibitors: Secondary | ICD-10-CM | POA: Diagnosis not present

## 2019-05-16 DIAGNOSIS — C50512 Malignant neoplasm of lower-outer quadrant of left female breast: Secondary | ICD-10-CM | POA: Diagnosis not present

## 2019-05-16 MED ORDER — ANASTROZOLE 1 MG PO TABS: 1.0000 mg | ORAL_TABLET | Freq: Every day | ORAL | 3 refills | Status: DC

## 2019-05-16 NOTE — Assessment & Plan Note (Signed)
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  Neoadjuvant antiestrogen therapy. 2.Breast conserving surgery followed by 3. Adjuvant radiation therapy followed by 4.Continuation of adjuvant antiestrogen therapy --------------------------------------------------------------------------------------------------------------------------- Current treatment: Neoadjuvant anastrozole 1 mg daily started 02/17/2019 Anastrozole toxicities: Tolerating it well    Mammogram and ultrasound 05/12/2019: Stable to slight decrease in size of the left breast cancer 2.3 cm decreased from 2.5 cm.  Subareolar tumor 0.7 cm, stable She will be on antiestrogen therapy for total of 6 months and then will perform breast MRI.  Return in 3 months with breast MRI and follow-up  Return to clinic in 2 months with mammogram ultrasound and follow-up

## 2019-05-17 ENCOUNTER — Telehealth: Payer: Self-pay | Admitting: Hematology and Oncology

## 2019-05-17 NOTE — Telephone Encounter (Signed)
I talk with patient regarding schedule  

## 2019-05-29 DIAGNOSIS — C50512 Malignant neoplasm of lower-outer quadrant of left female breast: Secondary | ICD-10-CM | POA: Diagnosis not present

## 2019-05-29 DIAGNOSIS — R569 Unspecified convulsions: Secondary | ICD-10-CM | POA: Diagnosis not present

## 2019-05-29 DIAGNOSIS — Z170 Estrogen receptor positive status [ER+]: Secondary | ICD-10-CM | POA: Diagnosis not present

## 2019-05-29 DIAGNOSIS — G250 Essential tremor: Secondary | ICD-10-CM | POA: Diagnosis not present

## 2019-05-29 DIAGNOSIS — Z7189 Other specified counseling: Secondary | ICD-10-CM | POA: Diagnosis not present

## 2019-06-27 ENCOUNTER — Encounter: Payer: Self-pay | Admitting: *Deleted

## 2019-07-12 ENCOUNTER — Other Ambulatory Visit: Payer: Self-pay

## 2019-07-12 ENCOUNTER — Encounter: Payer: Self-pay | Admitting: Neurology

## 2019-07-12 ENCOUNTER — Ambulatory Visit (INDEPENDENT_AMBULATORY_CARE_PROVIDER_SITE_OTHER): Payer: Medicare Other | Admitting: Neurology

## 2019-07-12 VITALS — BP 175/81 | HR 84 | Temp 97.2°F | Ht 64.0 in | Wt 143.0 lb

## 2019-07-12 DIAGNOSIS — G40409 Other generalized epilepsy and epileptic syndromes, not intractable, without status epilepticus: Secondary | ICD-10-CM

## 2019-07-12 DIAGNOSIS — R251 Tremor, unspecified: Secondary | ICD-10-CM | POA: Diagnosis not present

## 2019-07-12 MED ORDER — PRIMIDONE 50 MG PO TABS: ORAL_TABLET | ORAL | 3 refills | Status: DC

## 2019-07-12 NOTE — Progress Notes (Signed)
PATIENT: Robin Anderson DOB: Apr 12, 1941  REASON FOR VISIT: follow up HISTORY FROM: patient  HISTORY OF PRESENT ILLNESS: Today 07/12/19  Robin Anderson is a 79 year old female with history of seizure disorder and tremor in her right hand. She has previously been on Keppra, but stopped due to report of moles on her face and scalp. She remains on primidone for seizure prevention and tremor. Her last seizure occurred in 2018 while taking Keppra 250 mg in the morning, 500 mg in the evening. The seizure episode was described as having woken up in the morning, had bitten her lip. She is taking primidone 150 mg at bedtime. She has breast cancer, is on oral chemo.  She has not had recurrent seizure.  The tremor is improved with primidone in the right hand, it may increase during times of anxiety. When she takes the primidone at night, she may have a chalky taste in her mouth.  She does not drive a car.  She presents today for follow-up unaccompanied.  HISTORY 01/11/2019 SS: Robin Anderson is a 79 year old female with history of seizure disorder and tremor in her right hand.  In the past she has been taking Keppra, but stopped due to report of moles on her face and scalp.  After last visit she was started on primidone for seizure prevention and tremor.  The primidone has been beneficial for her tremor, more than 75%.  The tremor is not constant and it does not prevent her from doing anything.  She has not had recurrent seizure.  She is currently taking primidone 150 mg at bedtime.  She last had a seizure in 2018 while taking Keppra 250 mg in the morning, 500 mg the evening.  The seizure episode was described as having woken up in the morning and bitten her lip.  She reports she has been helped have a diagnostic mammogram for a lump in her left breast.  She is checking her blood pressure at home.  She says her BP is elevated today because she ate bacon.  She lives with her husband.  She does not drive a car.  She  presents today unaccompanied.  REVIEW OF SYSTEMS: Out of a complete 14 system review of symptoms, the patient complains only of the following symptoms, and all other reviewed systems are negative.  Tremor, seizure  ALLERGIES: Allergies  Allergen Reactions  . Penicillins Rash    HOME MEDICATIONS: Outpatient Medications Prior to Visit  Medication Sig Dispense Refill  . anastrozole (ARIMIDEX) 1 MG tablet Take 1 tablet (1 mg total) by mouth daily. 90 tablet 3  . aspirin EC 81 MG tablet Take 1 tablet (81 mg total) by mouth daily.    . benazepril-hydrochlorthiazide (LOTENSIN HCT) 20-25 MG tablet Take 1 tablet by mouth daily. 90 tablet 3  . calcium carbonate (CALCIUM 600) 600 MG TABS tablet Take 600 mg by mouth daily.    . Multiple Vitamins-Minerals (ICAPS MV PO) Take by mouth daily.    . simvastatin (ZOCOR) 20 MG tablet TAKE 1 TABLET BY MOUTH EVERYDAY AT BEDTIME 90 tablet 3  . Vitamin D, Cholecalciferol, 10 MCG (400 UNIT) CAPS Take 1 capsule by mouth daily.    . primidone (MYSOLINE) 50 MG tablet TAKE 3 TABLETS BY MOUTH AT BEDTIME 270 tablet 1   No facility-administered medications prior to visit.    PAST MEDICAL HISTORY: Past Medical History:  Diagnosis Date  . Blood in stool   . Chest pain, unspecified   . Family history  of breast cancer   . Headache 07/02/2014  . Heart murmur   . Hepatitis   . History of colon polyps   . HLD (hyperlipidemia)   . HTN (hypertension)   . Prediabetes 07/28/2011  . Seizure disorder ([place])     PAST SURGICAL HISTORY: Past Surgical History:  Procedure Laterality Date  . ABDOMINAL HERNIA REPAIR     lower mid line hernia which has reherniatied  . BREAST BIOPSY Left 01/31/2019   Malignant  . BUNIONECTOMY Right    traumatic injury to her foot that was repaired  . TONSILLECTOMY    . TUBAL LIGATION      FAMILY HISTORY: Family History  Problem Relation Age of Onset  . Hypertension Mother   . Heart attack Father   . Hypertension Sister        5  sisters....3 have HTN  . Hypertension Brother   . Breast cancer Sister   . Heart attack Daughter     SOCIAL HISTORY: Social History   Socioeconomic History  . Marital status: Married    Spouse name: [name]  . Number of children: 6  . Years of education: 64  . Highest education level: Not on file  Occupational History  . Occupation: retired  Tobacco Use  . Smoking status: Former Smoker    Types: Cigarettes  . Smokeless tobacco: Never Used  . Tobacco comment: when she was young for a short period of time  Substance and Sexual Activity  . Alcohol use: No    Alcohol/week: 0.0 standard drinks  . Drug use: No  . Sexual activity: Not on file  Other Topics Concern  . Not on file  Social History Narrative   Patient lives at home with her husband Robin Anderson.   Retired.   Education high school.   Right handed..      Regular exercise-yes   Caffeine Use-yes   Social Determinants of Health   Financial Resource Strain:   . Difficulty of Paying Living Expenses:   Food Insecurity:   . Worried About Charity fundraiser in the Last Year:   . Arboriculturist in the Last Year:   Transportation Needs: No Transportation Needs  . Lack of Transportation (Medical): No  . Lack of Transportation (Non-Medical): No  Physical Activity:   . Days of Exercise per Week:   . Minutes of Exercise per Session:   Stress:   . Feeling of Stress :   Social Connections:   . Frequency of Communication with Friends and Family:   . Frequency of Social Gatherings with Friends and Family:   . Attends Religious Services:   . Active Member of Clubs or Organizations:   . Attends Archivist Meetings:   Marland Kitchen Marital Status:   Intimate Partner Violence: Not At Risk  . Fear of Current or Ex-Partner: No  . Emotionally Abused: No  . Physically Abused: No  . Sexually Abused: No   PHYSICAL EXAM  Vitals:   07/12/19 0933  BP: (!) 175/81  Pulse: 84  Temp: (!) 97.2 F (36.2 C)  Weight: 143 lb  (64.9 kg)  Height: 5\' 4"  (1.626 m)   Body mass index is 24.55 kg/m.  Generalized: Well developed, in no acute distress   Neurological examination  Mentation: Alert oriented to time, place, history taking. Follows all commands speech and language fluent Cranial nerve II-XII: Pupils were equal round reactive to light. Extraocular movements were full, visual field were full on confrontational test. Facial  sensation and strength were normal.  Head turning and shoulder shrug were normal and symmetric. Motor: The motor testing reveals 5 over 5 strength of all 4 extremities. Good symmetric motor tone is noted throughout.  Mild intention tremor noted to the right hand. Sensory: Sensory testing is intact to soft touch on all 4 extremities. No evidence of extinction is noted.  Coordination: Cerebellar testing reveals good finger-nose-finger and heel-to-shin bilaterally.  Gait and station: Gait is normal. Tandem gait is mildly unsteady. Romberg is negative. No drift is seen.  Reflexes: Deep tendon reflexes are symmetric and normal bilaterally.   DIAGNOSTIC DATA (LABS, IMAGING, TESTING) - I reviewed patient records, labs, notes, testing and imaging myself where available.  Lab Results  Component Value Date   WBC 6.1 04/11/2019   HGB 11.7 04/11/2019   HCT 37.3 04/11/2019   MCV 86 04/11/2019   PLT 199 04/11/2019      Component Value Date/Time   NA 141 04/11/2019 1032   K 3.6 04/11/2019 1032   CL 101 04/11/2019 1032   CO2 29 04/11/2019 1032   GLUCOSE 94 04/11/2019 1032   GLUCOSE 78 09/11/2015 0944   BUN 14 04/11/2019 1032   CREATININE 1.05 (H) 04/11/2019 1032   CREATININE 0.95 (H) 09/11/2015 0944   CALCIUM 9.6 04/11/2019 1032   PROT 7.1 04/11/2019 1032   ALBUMIN 4.4 04/11/2019 1032   AST 17 04/11/2019 1032   ALT 7 04/11/2019 1032   ALKPHOS 97 04/11/2019 1032   BILITOT 0.3 04/11/2019 1032   GFRNONAA 52 (L) 04/11/2019 1032   GFRAA 60 04/11/2019 1032   Lab Results  Component Value  Date   CHOL 172 04/11/2019   HDL 79 04/11/2019   LDLCALC 81 04/11/2019   TRIG 59 04/11/2019   CHOLHDL 2.2 04/11/2019   Lab Results  Component Value Date   HGBA1C 5.8 08/10/2012   No results found for: [id number] Lab Results  Component Value Date   TSH 1.880 09/01/2017   ASSESSMENT AND PLAN 79 y.o. year old female  has a past medical history of Blood in stool, Chest pain, unspecified, Family history of breast cancer, Headache (07/02/2014), Heart murmur, Hepatitis, History of colon polyps, HLD (hyperlipidemia), HTN (hypertension), Prediabetes (07/28/2011), and Seizure disorder ([place]). here with:  1.  Seizures 2.  Essential tremor 3.  Breast cancer, on oral chemo (no interaction found with primidone)  She has done well since last seen.  She will remain on primidone 150 mg at bedtime for tremor and seizure.  The primidone level was therapeutic at 7.1 in September 2020.  She will follow-up in 6 months or sooner if needed.  We can check blood work at that time.  I spent 20 minutes of face-to-face and non-face-to-face time with patient.  This included previsit chart review, lab review, study review, order entry, electronic health record documentation, patient education.  Butler Denmark, AGNP-C, DNP 07/12/2019, 10:05 AM [facility] [address] [place], [place] [address] [phone]

## 2019-07-12 NOTE — Patient Instructions (Signed)
Continue taking Primidone  Call for seizures  See you back in 6 months

## 2019-07-13 NOTE — Progress Notes (Signed)
I have read the note, and I agree with the clinical assessment and plan.  Kinga Cassar K Serrita Lueth   

## 2019-08-07 ENCOUNTER — Other Ambulatory Visit: Payer: Self-pay

## 2019-08-07 ENCOUNTER — Ambulatory Visit
Admission: RE | Admit: 2019-08-07 | Discharge: 2019-08-07 | Disposition: A | Discharge status: Home / Self Care | Payer: Medicare Other | Source: Ambulatory Visit | Attending: Hematology and Oncology | Admitting: Hematology and Oncology

## 2019-08-07 DIAGNOSIS — C50512 Malignant neoplasm of lower-outer quadrant of left female breast: Secondary | ICD-10-CM

## 2019-08-07 DIAGNOSIS — C50911 Malignant neoplasm of unspecified site of right female breast: Secondary | ICD-10-CM | POA: Diagnosis not present

## 2019-08-07 DIAGNOSIS — C50912 Malignant neoplasm of unspecified site of left female breast: Secondary | ICD-10-CM | POA: Diagnosis not present

## 2019-08-07 DIAGNOSIS — Z170 Estrogen receptor positive status [ER+]: Secondary | ICD-10-CM

## 2019-08-07 IMAGING — MR MR BREAST BILAT WO/W CM
8 of 12 series · 32 of 48 positions shown · IV contrast (6ml Gadavist)
Comparison: Previous exam(s).

CLINICAL DATA: 78-year-old female with bilateral breast cancer
status post neoadjuvant therapy.

LABS:  None performed on site.
EXAM:
BILATERAL BREAST MRI WITH AND WITHOUT CONTRAST
TECHNIQUE: Multiplanar, multisequence MR images of both breasts were obtained
prior to and following the intravenous administration of 6 ml of
Gadavist.

[Series 2: t2_tirm_tra ipat (a-p) · axial · 3.0mm · 0.70mm/px · 1 of 55 slices shown]
[im 1/55]
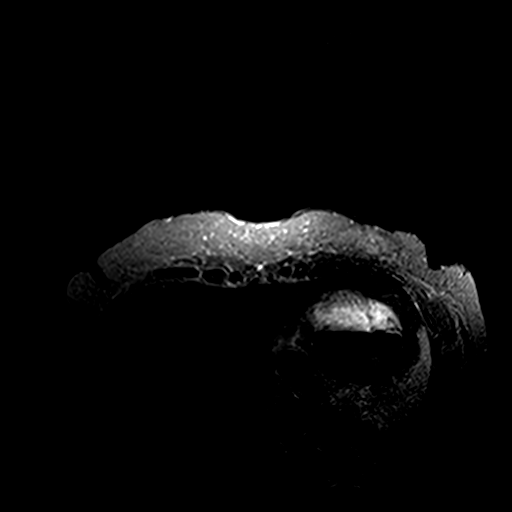

[Series 3: fl3d pre-cm no · axial · non-contrast · 1.2mm · 0.94mm/px · z∈[-9,+163]mm · 5 of 144 slices shown]
[im 1/144]
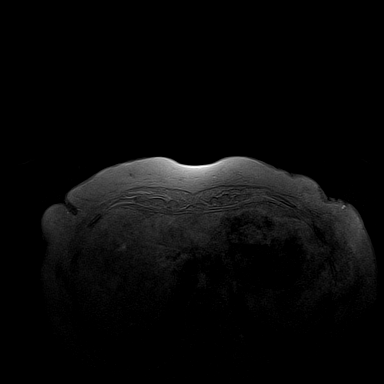
[im 36/144]
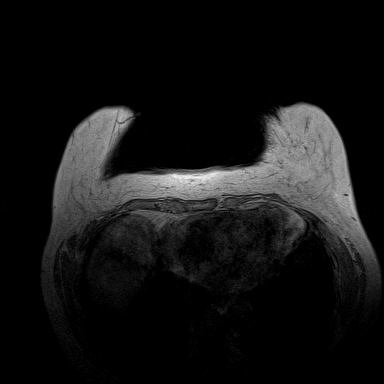
[im 72/144]
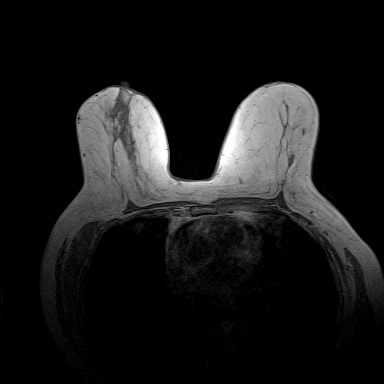
[im 108/144]
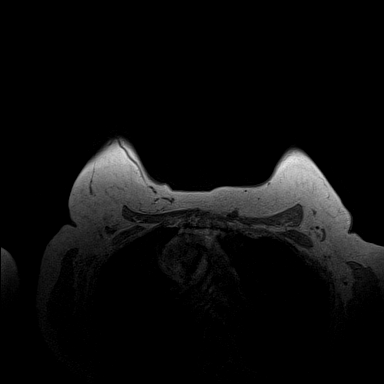
[im 144/144]
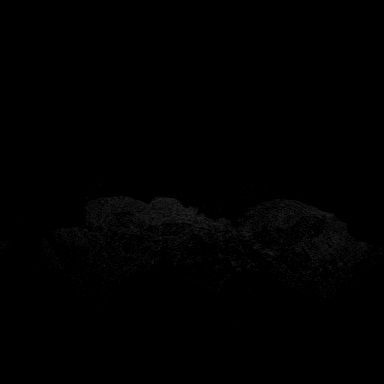

[Series 4: fl3d pre-cm · axial · non-contrast · 1.2mm · 0.94mm/px · z∈[-9,+163]mm · 5 of 144 slices shown]
[im 1/144]
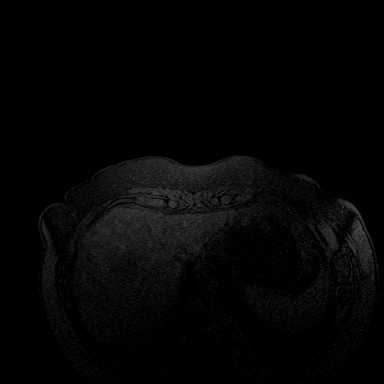
[im 36/144]
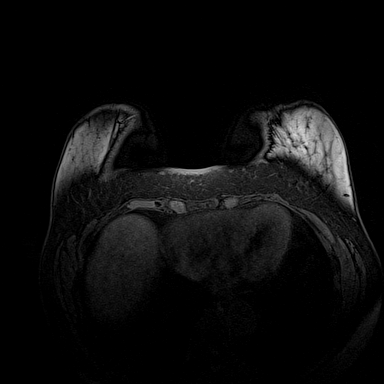
[im 72/144]
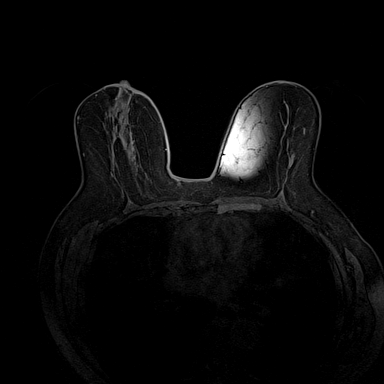
[im 108/144]
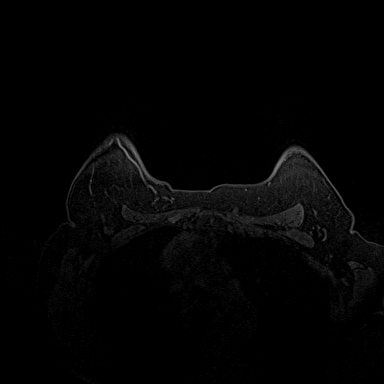
[im 144/144]
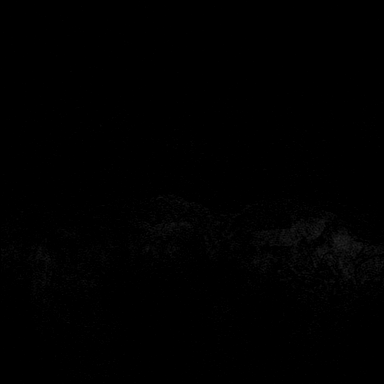

[Series 5: fl3d post-cm 20 · axial · 1.2mm · 0.94mm/px · z∈[-9,+163]mm · 5 of 144 slices shown (1 of 3)]
[im 1/144]
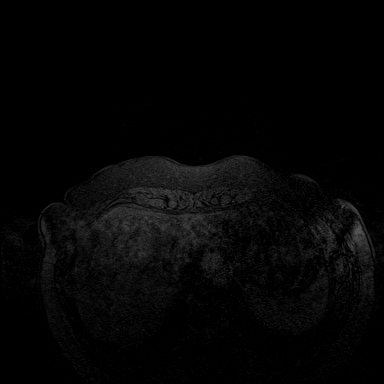
[im 36/144]
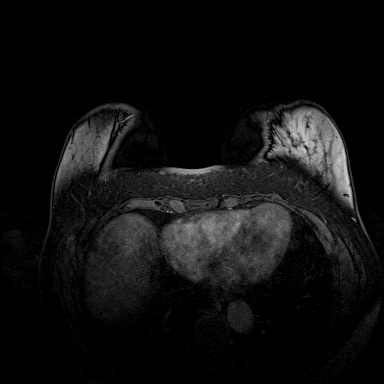
[im 72/144]
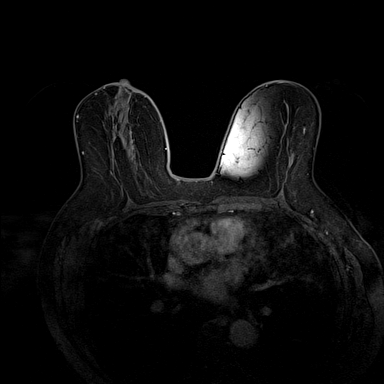
[im 108/144]
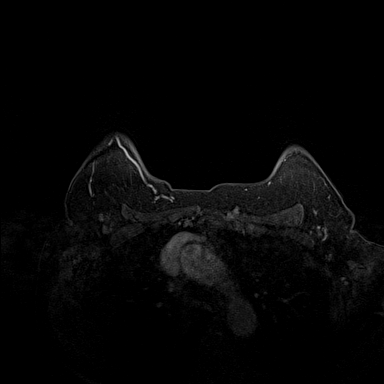
[im 144/144]
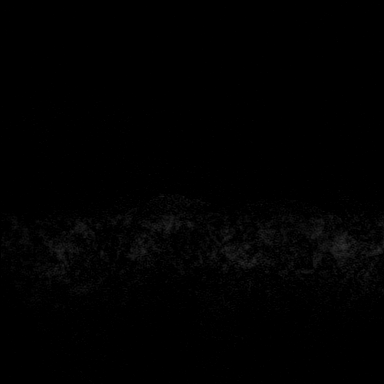

[Series 6: fl3d post-cm 20 · axial · 1.2mm · 0.94mm/px · z∈[-9,+163]mm · 5 of 144 slices shown (2 of 3)]
[im 1/144]
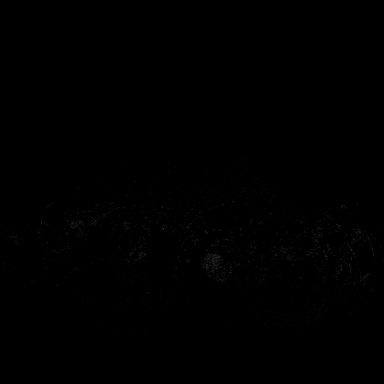
[im 36/144]
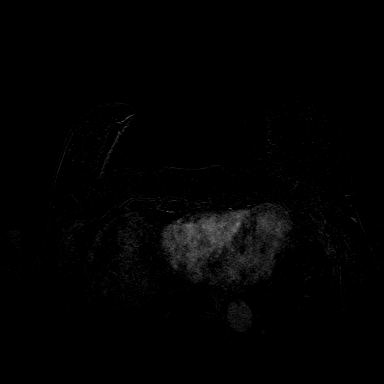
[im 72/144]
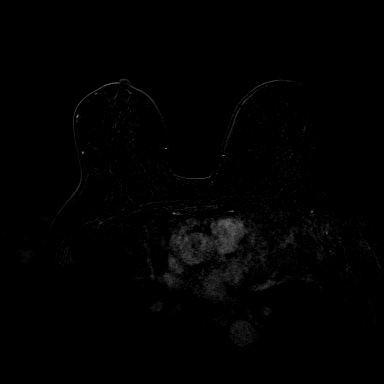
[im 108/144]
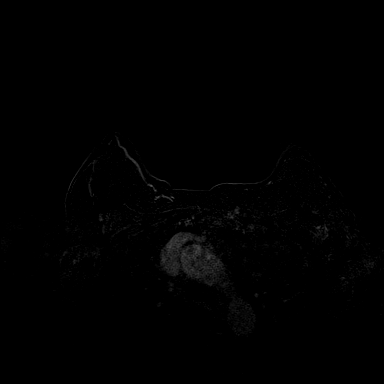
[im 144/144]
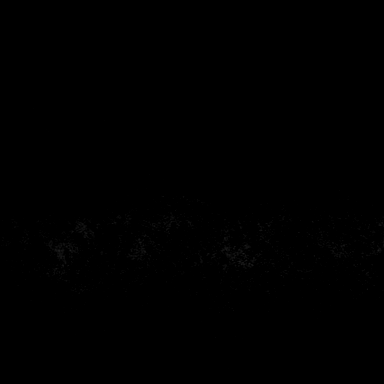

[Series 7: fl3d post-cm 20 · axial · 172.8mm · 0.94mm/px · 1 of 1 slices shown (3 of 3)]
[im 1/1]
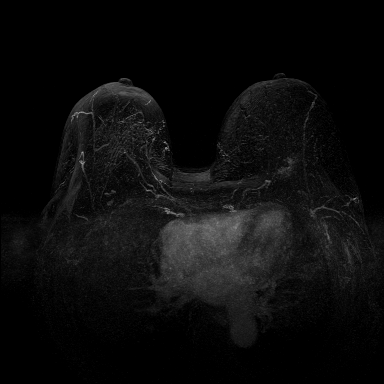

[Series 8: fl3d post-cm 3min · axial · 1.2mm · 0.94mm/px · z∈[-9,+163]mm · 6 of 144 slices shown]
[im 1/144]
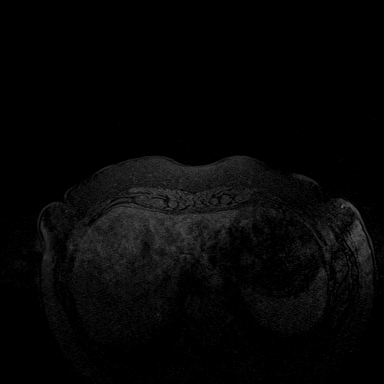
[im 29/144]
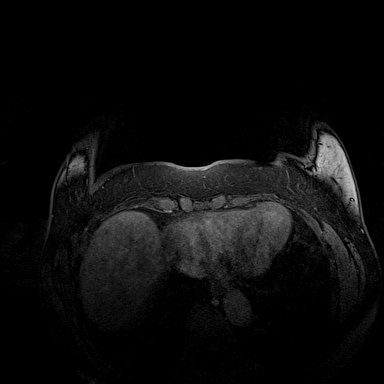
[im 58/144]
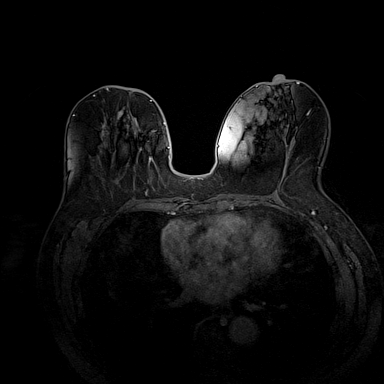
[im 86/144]
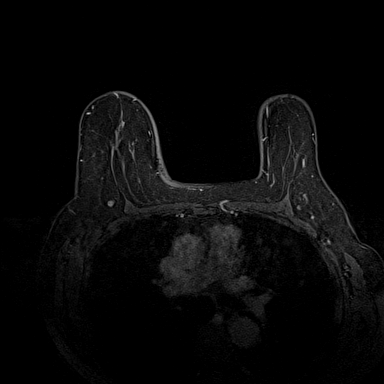
[im 115/144]
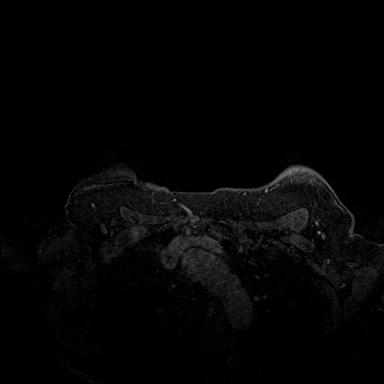
[im 144/144]
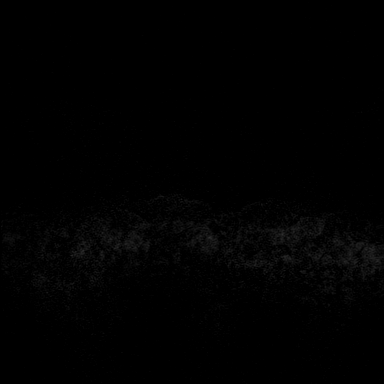

[Series 9: fl3d post-cm 3min_sub · axial · 1.2mm · 0.94mm/px · z∈[-9,+93]mm · 4 of 144 slices shown]
[im 1/144]
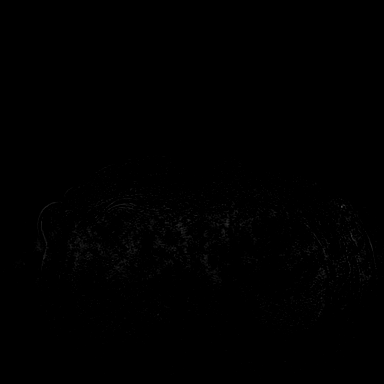
[im 29/144]
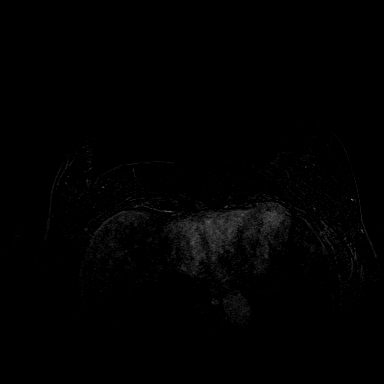
[im 58/144]
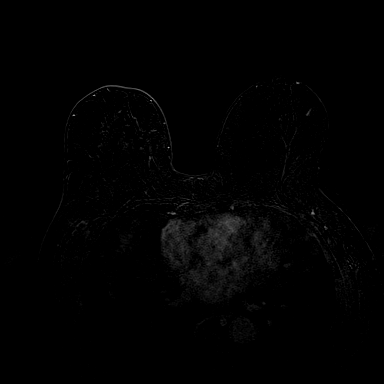
[im 86/144]
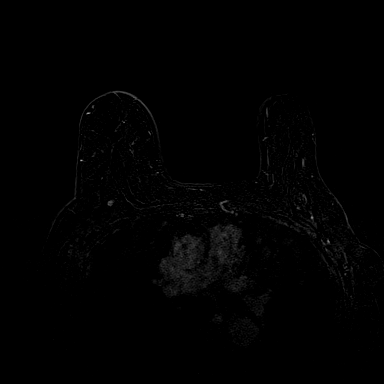

[32 of 48 positions shown; findings below may reference images not displayed]

Three-dimensional MR images were rendered by post-processing of the
original MR data on an independent workstation. The
three-dimensional MR images were interpreted, and findings are
reported in the following complete MRI report for this study. Three
dimensional images were evaluated at the independent [employer]
workstation
FINDINGS: Breast composition: c. Heterogeneous fibroglandular tissue.

Background parenchymal enhancement: Mild.

Right breast: Susceptibility artifact is seen in the central medial
right breast at middle depth (series 3, image 86/144). This is the
site of the patient's biopsy proven low-grade DCIS. The biopsy clip
is located approximately 1.5 cm medial to the site of previous
enhancement. No focal enhancement is seen on today's study in this
location. No other suspicious mass or abnormal enhancement.

Left breast: Redemonstration of a spiculated enhancing mass in the
inferior left breast at posterior depth (series 9, image 100/144).
It measures 3.0 x 2.4 x 1.1 cm (previously 3.0 x 2.4 x 2.6 cm). The
overall appearance is contracted with less enhancement than before.

Susceptibility artifact from an additional post biopsy clip is
demonstrated in the superior subareolar aspect (series 3, image
82/144). No significant enhancement is seen in association.

Lymph nodes: No abnormal appearing lymph nodes.

Ancillary findings:  None.
IMPRESSION: 1. Decreased enhancement and fullness of a spiculated enhancing mass
in the inferior left breast suggesting some response to
chemotherapy. The mass currently measures 3 x 2.4 x 1.1 cm
(previously 3 x 2.4 x 2.6 cm).
2. Additional site of biopsy proven malignancy in the superior
subareolar left breast, with no associated enhancement on today's
study.
3. Biopsy proven DCIS in the central right breast with no associated
enhancement on today's study. Susceptibility artifact from post
biopsy clip is located approximately 1.5 cm medial to the site of
prior enhancement.

RECOMMENDATION:
Per clinical treatment plan.

BI-RADS CATEGORY  6: Known biopsy-proven malignancy.

## 2019-08-07 MED ORDER — GADOBUTROL 1 MMOL/ML IV SOLN
6.0000 mL | Freq: Once | INTRAVENOUS | Status: AC | PRN
  Administered 2019-08-07: 6 mL via INTRAVENOUS

## 2019-08-08 ENCOUNTER — Encounter: Payer: Self-pay | Admitting: *Deleted

## 2019-08-11 ENCOUNTER — Ambulatory Visit: Payer: Self-pay | Admitting: Surgery

## 2019-08-11 DIAGNOSIS — C50912 Malignant neoplasm of unspecified site of left female breast: Secondary | ICD-10-CM | POA: Diagnosis not present

## 2019-08-11 DIAGNOSIS — D0511 Intraductal carcinoma in situ of right breast: Secondary | ICD-10-CM | POA: Diagnosis not present

## 2019-08-11 NOTE — H&P (Signed)
Robin Anderson Appointment: 08/11/2019 11:40 AM Location: [facility] Patient #: [id number] DOB: 19-Jan-1941 Undefined / Language: [name] / Race: Black or African American Female  History of Present Illness [name] A. 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 She had multifocal disease in the left and was been managed by antiestrogen therapy for last 6 months. Repeat magnetic resonance imaging showed minimal reduction in size without any evidence of expansion. A second area was noted on the right cor biopsy done in October 2020 which showed DCIS. Enhancement in the right breast was last on her most recent magnetic resonance imaging. She still desires breast conserving surgery.                   79 year old female with bilateral breast cancer status post neoadjuvant therapy.  LABS: None performed on site.  EXAM: BILATERAL BREAST MRI WITH AND WITHOUT CONTRAST  TECHNIQUE: Multiplanar, multisequence MR images of both breasts were obtained prior to and following the intravenous administration of 6 ml of Gadavist.  Three-dimensional MR images were rendered by post-processing of the original MR data on an independent workstation. The three-dimensional MR images were interpreted, and findings are reported in the following complete MRI report for this study. Three dimensional images were evaluated at the independent DynaCad workstation  COMPARISON: Previous exam(s).  FINDINGS: Breast composition: c. Heterogeneous fibroglandular tissue.  Background parenchymal enhancement: Mild.  Right breast: Susceptibility artifact is seen in the central medial right breast at middle depth (series 3, image 86/144). This is the site of the patient's biopsy proven low-grade DCIS. The biopsy clip is located approximately 1.5 cm medial to the site of previous enhancement. No focal enhancement is seen on  today's study in this location. No other suspicious mass or abnormal enhancement.  Left breast: Redemonstration of a spiculated enhancing mass in the inferior left breast at posterior depth (series 9, image 100/144). It measures 3.0 x 2.4 x 1.1 cm (previously 3.0 x 2.4 x 2.6 cm). The overall appearance is contracted with less enhancement than before.  Susceptibility artifact from an additional post biopsy clip is demonstrated in the superior subareolar aspect (series 3, image 82/144). No significant enhancement is seen in association.  Lymph nodes: No abnormal appearing lymph nodes.  Ancillary findings: None.  IMPRESSION: 1. Decreased enhancement and fullness of a spiculated enhancing mass in the inferior left breast suggesting some response to chemotherapy. The mass currently measures 3 x 2.4 x 1.1 cm (previously 3 x 2.4 x 2.6 cm). 2. Additional site of biopsy proven malignancy in the superior subareolar left breast, with no associated enhancement on today's study. 3. 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 Susceptibility artifact from post biopsy clip is located approximately 1.5 cm medial to the site of prior enhancement.  RECOMMENDATION: Per clinical treatment plan.  BI-RADS CATEGORY 6: Known biopsy-proven malignancy.   Electronically Signed By: Kristopher Oppenheim M.D. On: 08/07/2019 13:28.  The patient is a 79 year old female.   Allergies (Tanisha A. Owens Shark, [place]; 08/11/2019 11:38 AM) Penicillins Hives. Allergies Reconciled  Medication History (Tanisha A. Owens Shark, [place]; 08/11/2019 11:38 AM) Primidone (50MG  Tablet, Oral) Active. Benazepril-hydroCHLOROthiazide (20-12.5MG  Tablet, Oral) Active. Simvastatin (20MG  Tablet, Oral) Active. Aspirin (81MG  Tablet DR, Oral) Active. Multi Vitamin (Oral) Active. Medications Reconciled    Vitals (Tanisha A. Laver RMA; 08/11/2019 11:39 AM) 08/11/2019 11:38 AM Weight:  143.8 lb Height: 64in Body Surface Area: 1.7 m  Body Mass Index: 24.68 kg/m  Temp.: 4F  Pulse: 88 (Regular)  BP: 142/86(Sitting, Left Arm, Standard)        Physical Exam ([place] Chahal A. [place] Dalporto MD; 08/11/2019 1:19 PM)  General Mental Status-Alert. General Appearance-Consistent with stated age. Hydration-Well hydrated. Voice-Normal.  Chest and Lung Exam Chest and lung exam reveals -quiet, even and easy respiratory effort with no use of accessory muscles and on auscultation, normal breath sounds, no adventitious sounds and normal vocal resonance. Inspection Chest Wall - Normal. Back - normal.  Breast Breast - Left-Symmetric, Non Tender, No Biopsy scars, no Dimpling - Left, No Inflammation, No Lumpectomy scars, No Mastectomy scars, No Peau d' Orange. Breast - Right-Symmetric, Non Tender, No Biopsy scars, no Dimpling - Right, No Inflammation, No Lumpectomy scars, No Mastectomy scars, No Peau d' Orange. Breast Lump-No Palpable Breast Mass.  Cardiovascular Cardiovascular examination reveals -normal heart sounds, regular rate and rhythm with no murmurs and normal pedal pulses bilaterally.  Lymphatic Head & Neck  General Head & Neck Lymphatics: Bilateral - Description - Normal. Axillary  General Axillary Region: Bilateral - Description - Normal. Tenderness - Non Tender.    Assessment & Plan (Davianna Deutschman A. Hykeem Ojeda MD; 08/11/2019 1:19 PM)  BREAST NEOPLASM, TIS (DCIS), RIGHT (D05.11) Impression: SEED localized lumpectomy on the right Risk of lumpectomy include bleeding, infection, seroma, more surgery, use of seed/wire, wound care, cosmetic deformity and the need for other treatments, death , blood clots, death. Pt agrees to proceed.   BREAST CANCER, LEFT (C50.912) Impression: MULTIFOCAL  Seed localized lumpectomy 2 on the left with sentinel lymph node mapping Risk of lumpectomy include bleeding, infection, seroma, more surgery, use of seed/wire, wound  care, cosmetic deformity and the need for other treatments, death , blood clots, death. Pt agrees to proceed. Risk of sentinel lymph node mapping include bleeding, infection, lymphedema, shoulder pain. stiffness, dye allergy. cosmetic deformity , blood clots, death, need for more surgery. Pt agres to proceed.  Current Plans You are being scheduled for surgery- Our schedulers will call you.  You should hear from our office's scheduling department within 5 working days about the location, date, and time of surgery. We try to make accommodations for patient's preferences in scheduling surgery, but sometimes the OR schedule or the surgeon's schedule prevents [place] from making those accommodations.  If you have not heard from our office [phone] in 5 working days, call the office and ask for your surgeon's nurse.  If you have other questions about your diagnosis, plan, or surgery, call the office and ask for your surgeon's nurse.  Pt Education - [facility] Breast Cancer Information Given - Alight "Breast Journey" Package Pt Education - [facility] Breast Biopsy HCI: discussed with patient and provided information. Pt Education - flb breast cancer surgery: discussed with patient and provided information. We discussed the staging and pathophysiology of breast cancer. We discussed all of the different options for treatment for breast cancer including surgery, chemotherapy, radiation therapy, Herceptin, and antiestrogen therapy. We discussed a sentinel lymph node biopsy as she does not appear to having lymph node involvement right now. We discussed the performance of that with injection of radioactive tracer and blue dye. We discussed that she would have an incision underneath her axillary hairline. We discussed that there is a bout a 10-20% chance of having a positive node with a sentinel lymph node biopsy and we will await the permanent pathology to make any other first further decisions in terms of her treatment.  One of these options might  be to return to the operating room to perform an axillary lymph node dissection. We discussed about a 1-2% risk lifetime of chronic shoulder pain as well as lymphedema associated with a sentinel lymph node biopsy. We discussed the options for treatment of the breast cancer which included lumpectomy versus a mastectomy. We discussed the performance of the lumpectomy with a wire placement. We discussed a 10-20% chance of a positive margin requiring reexcision in the operating room. We also discussed that she may need radiation therapy or antiestrogen therapy or both if she undergoes lumpectomy. We discussed the mastectomy and the postoperative care for that as well. We discussed that there is no difference in her survival whether she undergoes lumpectomy with radiation therapy or antiestrogen therapy versus a mastectomy. There is a slight difference in the local recurrence rate being 3-5% with lumpectomy and about 1% with a mastectomy. We discussed the risks of operation including bleeding, infection, possible reoperation. She understands her further therapy will be based on what her stages at the time of her operation.  Pt Education - [facility] Breast Pains Education

## 2019-08-11 NOTE — H&P (View-Only) (Signed)
Robin Anderson Appointment: 08/11/2019 11:40 AM Location: [facility] Patient #: [id number] DOB: 1940/06/30 Undefined / Language: Robin Anderson / Race: Black or African American Female  History of Present Illness [name] A. 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 She had multifocal disease in the left and was been managed by antiestrogen therapy for last 6 months. Repeat magnetic resonance imaging showed minimal reduction in size without any evidence of expansion. A second area was noted on the right cor biopsy done in October 2020 which showed DCIS. Enhancement in the right breast was last on her most recent magnetic resonance imaging. She still desires breast conserving surgery.                   79 year old female with bilateral breast cancer status post neoadjuvant therapy.  LABS: None performed on site.  EXAM: BILATERAL BREAST MRI WITH AND WITHOUT CONTRAST  TECHNIQUE: Multiplanar, multisequence MR images of both breasts were obtained prior to and following the intravenous administration of 6 ml of Gadavist.  Three-dimensional MR images were rendered by post-processing of the original MR data on an independent workstation. The three-dimensional MR images were interpreted, and findings are reported in the following complete MRI report for this study. Three dimensional images were evaluated at the independent DynaCad workstation  COMPARISON: Previous exam(s).  FINDINGS: Breast composition: c. Heterogeneous fibroglandular tissue.  Background parenchymal enhancement: Mild.  Right breast: Susceptibility artifact is seen in the central medial right breast at middle depth (series 3, image 86/144). This is the site of the patient's biopsy proven low-grade DCIS. The biopsy clip is located approximately 1.5 cm medial to the site of previous enhancement. No focal enhancement is seen on  today's study in this location. No other suspicious mass or abnormal enhancement.  Left breast: Redemonstration of a spiculated enhancing mass in the inferior left breast at posterior depth (series 9, image 100/144). It measures 3.0 x 2.4 x 1.1 cm (previously 3.0 x 2.4 x 2.6 cm). The overall appearance is contracted with less enhancement than before.  Susceptibility artifact from an additional post biopsy clip is demonstrated in the superior subareolar aspect (series 3, image 82/144). No significant enhancement is seen in association.  Lymph nodes: No abnormal appearing lymph nodes.  Ancillary findings: None.  IMPRESSION: 1. Decreased enhancement and fullness of a spiculated enhancing mass in the inferior left breast suggesting some response to chemotherapy. The mass currently measures 3 x 2.4 x 1.1 cm (previously 3 x 2.4 x 2.6 cm). 2. Additional site of biopsy proven malignancy in the superior subareolar left breast, with no associated enhancement on today's study. 3. 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 Susceptibility artifact from post biopsy clip is located approximately 1.5 cm medial to the site of prior enhancement.  RECOMMENDATION: Per clinical treatment plan.  BI-RADS CATEGORY 6: Known biopsy-proven malignancy.   Electronically Signed By: Kristopher Oppenheim M.D. On: 08/07/2019 13:28.  The patient is a 79 year old female.   Allergies (Tanisha A. Owens Shark, [place]; 08/11/2019 11:38 AM) Penicillins Hives. Allergies Reconciled  Medication History (Tanisha A. Owens Shark, [place]; 08/11/2019 11:38 AM) Primidone (50MG  Tablet, Oral) Active. Benazepril-hydroCHLOROthiazide (20-12.5MG  Tablet, Oral) Active. Simvastatin (20MG  Tablet, Oral) Active. Aspirin (81MG  Tablet DR, Oral) Active. Multi Vitamin (Oral) Active. Medications Reconciled    Vitals (Tanisha A. Loyola RMA; 08/11/2019 11:39 AM) 08/11/2019 11:38 AM Weight:  143.8 lb Height: 64in Body Surface Area: 1.7 m  Body Mass Index: 24.68 kg/m  Temp.: 70F  Pulse: 88 (Regular)  BP: 142/86(Sitting, Left Arm, Standard)        Physical Exam (Thomas A. [name] MD; 08/11/2019 1:19 PM)  General Mental Status-Alert. General Appearance-Consistent with stated age. Hydration-Well hydrated. Voice-Normal.  Chest and Lung Exam Chest and lung exam reveals -quiet, even and easy respiratory effort with no use of accessory muscles and on auscultation, normal breath sounds, no adventitious sounds and normal vocal resonance. Inspection Chest Wall - Normal. Back - normal.  Breast Breast - Left-Symmetric, Non Tender, No Biopsy scars, no Dimpling - Left, No Inflammation, No Lumpectomy scars, No Mastectomy scars, No Peau d' Orange. Breast - Right-Symmetric, Non Tender, No Biopsy scars, no Dimpling - Right, No Inflammation, No Lumpectomy scars, No Mastectomy scars, No Peau d' Orange. Breast Lump-No Palpable Breast Mass.  Cardiovascular Cardiovascular examination reveals -normal heart sounds, regular rate and rhythm with no murmurs and normal pedal pulses bilaterally.  Lymphatic Head & Neck  General Head & Neck Lymphatics: Bilateral - Description - Normal. Axillary  General Axillary Region: Bilateral - Description - Normal. Tenderness - Non Tender.    Assessment & Plan (Thomas A. [name] MD; 08/11/2019 1:19 PM)  BREAST NEOPLASM, TIS (DCIS), RIGHT (D05.11) Impression: SEED localized lumpectomy on the right Risk of lumpectomy include bleeding, infection, seroma, more surgery, use of seed/wire, wound care, cosmetic deformity and the need for other treatments, death , blood clots, death. Pt agrees to proceed.   BREAST CANCER, LEFT (C50.912) Impression: MULTIFOCAL  Seed localized lumpectomy 2 on the left with sentinel lymph node mapping Risk of lumpectomy include bleeding, infection, seroma, more surgery, use of seed/wire, wound  care, cosmetic deformity and the need for other treatments, death , blood clots, death. Pt agrees to proceed. Risk of sentinel lymph node mapping include bleeding, infection, lymphedema, shoulder pain. stiffness, dye allergy. cosmetic deformity , blood clots, death, need for more surgery. Pt agres to proceed.  Current Plans You are being scheduled for surgery- Our schedulers will call you.  You should hear from our office's scheduling department within 5 working days about the location, date, and time of surgery. We try to make accommodations for patient's preferences in scheduling surgery, but sometimes the OR schedule or the surgeon's schedule prevents [place] from making those accommodations.  If you have not heard from our office [phone]) in 5 working days, call the office and ask for your surgeon's nurse.  If you have other questions about your diagnosis, plan, or surgery, call the office and ask for your surgeon's nurse.  Pt Education - [facility]CS Breast Cancer Information Given - Alight "Breast Journey" Package Pt Education - [facility] Breast Biopsy HCI: discussed with patient and provided information. Pt Education - flb breast cancer surgery: discussed with patient and provided information. We discussed the staging and pathophysiology of breast cancer. We discussed all of the different options for treatment for breast cancer including surgery, chemotherapy, radiation therapy, Herceptin, and antiestrogen therapy. We discussed a sentinel lymph node biopsy as she does not appear to having lymph node involvement right now. We discussed the performance of that with injection of radioactive tracer and blue dye. We discussed that she would have an incision underneath her axillary hairline. We discussed that there is a bout a 10-20% chance of having a positive node with a sentinel lymph node biopsy and we will await the permanent pathology to make any other first further decisions in terms of her treatment.  One of these options might  be to return to the operating room to perform an axillary lymph node dissection. We discussed about a 1-2% risk lifetime of chronic shoulder pain as well as lymphedema associated with a sentinel lymph node biopsy. We discussed the options for treatment of the breast cancer which included lumpectomy versus a mastectomy. We discussed the performance of the lumpectomy with a wire placement. We discussed a 10-20% chance of a positive margin requiring reexcision in the operating room. We also discussed that she may need radiation therapy or antiestrogen therapy or both if she undergoes lumpectomy. We discussed the mastectomy and the postoperative care for that as well. We discussed that there is no difference in her survival whether she undergoes lumpectomy with radiation therapy or antiestrogen therapy versus a mastectomy. There is a slight difference in the local recurrence rate being 3-5% with lumpectomy and about 1% with a mastectomy. We discussed the risks of operation including bleeding, infection, possible reoperation. She understands her further therapy will be based on what her stages at the time of her operation.  Pt Education - [facility] Breast Pains Education

## 2019-08-14 ENCOUNTER — Encounter: Payer: Self-pay | Admitting: *Deleted

## 2019-08-14 NOTE — Progress Notes (Signed)
Patient Care Team: Loretha Brasil, FNP as PCP - General (Family Medicine) Mauro Kaufmann, RN as Oncology Nurse Navigator Rockwell Germany, RN as Oncology Nurse Navigator  DIAGNOSIS:    ICD-10-CM   1. Malignant neoplasm of lower-outer quadrant of left breast of female, estrogen receptor positive ([place])  C50.512    Z17.0     SUMMARY OF ONCOLOGIC HISTORY: Oncology History  Malignant neoplasm of lower-outer quadrant of left breast of female, estrogen receptor positive ([place])  01/31/2019 Initial Diagnosis   Palpable lump in the left breast x4 months mammogram 01/27/2019: 2.5 cm mass at 6 o'clock position, 0.7 cm mass retroareolar, axilla negative: Grade 2 IDC ER 100%, PR 100%, HER-2 negative, Ki-67 15% T2N0 stage IB   02/17/2019 Cancer Staging   Staging form: Breast, AJCC 8th Edition - Clinical stage from 02/17/2019: Stage IB (cT2, cN0, cM0, G2, ER+, PR+, HER2-) - Signed by Nicholas Lose, MD on 02/17/2019   02/17/2019 -  Anti-estrogen oral therapy   Neoadjuvant anastrozole '1mg'$  daily     CHIEF COMPLIANT: Follow-up of left breast cancer on anastrozole  INTERVAL HISTORY: Robin Anderson is a 79 y.o. with above-mentioned history of left breast cancer currently on neoadjuvant antiestrogen therapy with anastrozole. Breast MRI on 08/07/19 showed decrease in the size of the inferior left breast mass from 3 x 2.4 x 2.6 cm previously to 3 x 2.4 x 1.1 cm, and the site of malignancy in the superior left breast and DCIS in the central right breast showed no enhancement. She presents to the clinic todayfor follow-up.  ALLERGIES:  is allergic to penicillins.  MEDICATIONS:  Current Outpatient Medications  Medication Sig Dispense Refill  . anastrozole (ARIMIDEX) 1 MG tablet Take 1 tablet (1 mg total) by mouth daily. 90 tablet 3  . aspirin EC 81 MG tablet Take 1 tablet (81 mg total) by mouth daily.    . benazepril-hydrochlorthiazide (LOTENSIN HCT) 20-25 MG tablet Take 1 tablet by mouth daily. 90  tablet 3  . calcium carbonate (CALCIUM 600) 600 MG TABS tablet Take 600 mg by mouth daily.    . Multiple Vitamins-Minerals (ICAPS MV PO) Take by mouth daily.    . primidone (MYSOLINE) 50 MG tablet TAKE 3 TABLETS BY MOUTH AT BEDTIME 270 tablet 3  . simvastatin (ZOCOR) 20 MG tablet TAKE 1 TABLET BY MOUTH EVERYDAY AT BEDTIME 90 tablet 3  . Vitamin D, Cholecalciferol, 10 MCG (400 UNIT) CAPS Take 1 capsule by mouth daily.     No current facility-administered medications for this visit.    PHYSICAL EXAMINATION: ECOG PERFORMANCE STATUS: 1 - Symptomatic but completely ambulatory  Vitals:   08/15/19 0919  BP: 140/64  Pulse: 67  Resp: 18  Temp: 98.9 F (37.2 C)  SpO2: 99%   Filed Weights   08/15/19 0919  Weight: 140 lb 11.2 oz (63.8 kg)       LABORATORY DATA:  I have reviewed the data as listed CMP Latest Ref Rng & Units 04/11/2019 01/11/2019 06/06/2018  Glucose 65 - 99 mg/dL 94 88 86  BUN 8 - 27 mg/dL '14 15 18  '$ Creatinine 0.57 - 1.00 mg/dL 1.05(H) 1.13(H) 1.00  Sodium 134 - 144 mmol/L 141 141 142  Potassium 3.5 - 5.2 mmol/L 3.6 3.6 3.7  Chloride 96 - 106 mmol/L 101 102 101  CO2 20 - 29 mmol/L '29 25 26  '$ Calcium 8.7 - 10.3 mg/dL 9.6 9.2 9.5  Total Protein 6.0 - 8.5 g/dL 7.1 7.1 7.3  Total Bilirubin  0.0 - 1.2 mg/dL 0.3 0.2 0.5  Alkaline Phos 39 - 117 IU/L 97 102 89  AST 0 - 40 IU/L '17 14 18  '$ ALT 0 - 32 IU/L '7 10 11    '$ Lab Results  Component Value Date   WBC 6.1 04/11/2019   HGB 11.7 04/11/2019   HCT 37.3 04/11/2019   MCV 86 04/11/2019   PLT 199 04/11/2019   NEUTROABS 4.2 01/11/2019    ASSESSMENT & PLAN:  Malignant neoplasm of lower-outer quadrant of left breast of female, estrogen receptor positive ([place]) 01/31/2019:Palpable lump in the left breast x4 months mammogram 01/27/2019: 2.5 cm mass at 6 o'clock position, 0.7 cm mass retroareolar, axilla negative: Grade 2 IDC ER 100%, PR 100%, HER-2 negative, Ki-67 15%  T2N0 stage IB 03/10/2019: Biopsy of the right breast: DCIS,  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Neoadjuvant antiestrogen therapy. 2.Breast conserving surgery followed by (patient is questioning the need for sentinel lymph node biopsy) 3. Adjuvant radiation therapy followed by (patient is also not keen on adjuvant radiation) 4.Continuation of adjuvant antiestrogen therapy --------------------------------------------------------------------------------------------------------------------------- Current treatment: Neoadjuvant anastrozole 1 mg daily started 02/17/2019 Anastrozole toxicities:Tolerating it well   Breast MRI 08/05/2019: Decrease enhancement and fullness of the spiculated mass measures 3 x 2.4 x 1.1 cm (previously 3 x 2.4 x 2.6 cm), additional site of biopsy subareolar left breast: No enhancement.  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  We will discuss her case in the breast tumor board and subsequently she will undergo surgery. We will discuss the issue her questions regarding sentinel lymph node biopsy as well as adjuvant radiation. I recommended that she at least get adjuvant radiation if she foregoes and sentinel lymph node biopsy.  Return to clinic after surgery to discuss the final pathology result.      No orders of the defined types were placed in this encounter.  The patient has a good understanding of the overall plan. she agrees with it. she will call with any problems that may develop before the next visit here.  Total time spent: 30 mins including face to face time and time spent for planning, charting and coordination of care  Nicholas Lose, MD 08/15/2019  I, Cloyde Reams Dorshimer, am acting as scribe for Dr. Nicholas Lose.  I have reviewed the above documentation for accuracy and completeness, and I agree with the above.

## 2019-08-15 ENCOUNTER — Inpatient Hospital Stay: Payer: Medicare Other | Attending: Hematology and Oncology | Admitting: Hematology and Oncology

## 2019-08-15 ENCOUNTER — Other Ambulatory Visit: Payer: Self-pay

## 2019-08-15 DIAGNOSIS — Z170 Estrogen receptor positive status [ER+]: Secondary | ICD-10-CM | POA: Diagnosis not present

## 2019-08-15 DIAGNOSIS — C50512 Malignant neoplasm of lower-outer quadrant of left female breast: Secondary | ICD-10-CM | POA: Insufficient documentation

## 2019-08-15 DIAGNOSIS — Z79811 Long term (current) use of aromatase inhibitors: Secondary | ICD-10-CM | POA: Diagnosis not present

## 2019-08-15 NOTE — Assessment & Plan Note (Signed)
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 2.Breast conserving surgery followed by 3. Adjuvant radiation therapy followed by 4.Continuation of adjuvant antiestrogen therapy --------------------------------------------------------------------------------------------------------------------------- Current treatment: Neoadjuvant anastrozole 1 mg daily started 02/17/2019 Anastrozole toxicities:Tolerating it well   Breast MRI 08/05/2019: Decrease enhancement and fullness of the spiculated mass measures 3 x 2.4 x 1.1 cm (previously 3 x 2.4 x 2.6 cm), additional site of biopsy subareolar left breast: No enhancement.  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  We will discuss her case in the breast tumor board and subsequently she will undergo surgery. Return to clinic after surgery to discuss the final pathology result.

## 2019-08-18 ENCOUNTER — Other Ambulatory Visit: Payer: Self-pay | Admitting: Nurse Practitioner

## 2019-08-23 ENCOUNTER — Other Ambulatory Visit: Payer: Self-pay | Admitting: Surgery

## 2019-08-23 DIAGNOSIS — C50912 Malignant neoplasm of unspecified site of left female breast: Secondary | ICD-10-CM

## 2019-08-23 DIAGNOSIS — Z170 Estrogen receptor positive status [ER+]: Secondary | ICD-10-CM

## 2019-08-24 ENCOUNTER — Encounter: Payer: Self-pay | Admitting: *Deleted

## 2019-08-29 ENCOUNTER — Encounter: Payer: Self-pay | Admitting: *Deleted

## 2019-08-31 DIAGNOSIS — I509 Heart failure, unspecified: Secondary | ICD-10-CM | POA: Diagnosis not present

## 2019-08-31 DIAGNOSIS — C50512 Malignant neoplasm of lower-outer quadrant of left female breast: Secondary | ICD-10-CM | POA: Diagnosis not present

## 2019-08-31 DIAGNOSIS — Z170 Estrogen receptor positive status [ER+]: Secondary | ICD-10-CM | POA: Diagnosis not present

## 2019-08-31 DIAGNOSIS — Z0000 Encounter for general adult medical examination without abnormal findings: Secondary | ICD-10-CM | POA: Diagnosis not present

## 2019-08-31 DIAGNOSIS — I110 Hypertensive heart disease with heart failure: Secondary | ICD-10-CM | POA: Diagnosis not present

## 2019-08-31 DIAGNOSIS — G319 Degenerative disease of nervous system, unspecified: Secondary | ICD-10-CM | POA: Diagnosis not present

## 2019-08-31 DIAGNOSIS — Z7189 Other specified counseling: Secondary | ICD-10-CM | POA: Diagnosis not present

## 2019-08-31 DIAGNOSIS — G250 Essential tremor: Secondary | ICD-10-CM | POA: Diagnosis not present

## 2019-08-31 DIAGNOSIS — I771 Stricture of artery: Secondary | ICD-10-CM | POA: Diagnosis not present

## 2019-08-31 NOTE — Progress Notes (Signed)
[id number] [place], [place] [id number] [place] [phone] Phone: [phone] Fax: [phone]      Your procedure is scheduled on Sep 05, 2019.  Report to [facility]tr[facility] "A" at 1:30  P.M., and check in at the Admitting office.  Call this number if you have problems the morning of surgery:  [phone]  Call [phone] if you have any questions prior to your surgery date Monday-Friday 8am-4pm    Remember:  Do not eat after midnight the night before your surgery  You may drink clear liquids until 12:30 the afternoon of your surgery.   Clear liquids allowed are: Water, Non-Citrus Juices (without pulp), Carbonated Beverages, Clear Tea, Black Coffee Only, and Gatorade  Please complete your PRE-SURGERY ENSURE that was provided to you by 12:30 PM the afternoon of surgery.  Please, if able, drink it in one setting. DO NOT SIP.    Take these medicines the morning of surgery with A SIP OF WATER:  NONE  As of today, STOP taking any Aspirin (unless otherwise instructed by your surgeon) and Aspirin containing products, Aleve, Naproxen, Ibuprofen, Motrin, Advil, Goody's, BC's, all herbal medications, fish oil, and all vitamins.                      Do not wear jewelry, make up, or nail polish            Do not wear lotions, powders, perfumes or deodorant.            Do not shave 48 hours prior to surgery.              Do not bring valuables to the hospital.            [facility] is not responsible for any belongings or valuables.  Do NOT Smoke (Tobacco/Vapping) or drink Alcohol 24 hours prior to your procedure If you use a CPAP at night, you may bring all equipment for your overnight stay.   Contacts, glasses, dentures or bridgework may not be worn into surgery.      For patients admitted to the hospital, discharge time will be determined by your treatment team.   Patients discharged the  day of surgery will not be allowed to drive home, and someone needs to stay with them for 24 hours.    Special instructions:   - Preparing For Surgery  Before surgery, you can play an important role. Because skin is not sterile, your skin needs to be as free of germs as possible. You can reduce the number of germs on your skin by washing with CHG (chlorahexidine gluconate) Soap before surgery.  CHG is an antiseptic cleaner which kills germs and bonds with the skin to continue killing germs even after washing.    Oral Hygiene is also important to reduce your risk of infection.  Remember - BRUSH YOUR TEETH THE MORNING OF SURGERY WITH YOUR REGULAR TOOTHPASTE  Please do not use if you have an allergy to CHG or antibacterial soaps. If your skin becomes reddened/irritated stop using the CHG.  Do not shave (including legs and underarms) for at least 48 hours prior to first CHG shower. It is OK to shave your face.  Please follow these instructions carefully.   1. Shower the NIGHT BEFORE SURGERY and the MORNING OF SURGERY with CHG Soap.   2. If you chose to wash your hair,  wash your hair first as usual with your normal shampoo.  3. After you shampoo, rinse your hair and body thoroughly to remove the shampoo.  4. Use CHG as you would any other liquid soap. You can apply CHG directly to the skin and wash gently with a scrungie or a clean washcloth.   5. Apply the CHG Soap to your body ONLY FROM THE NECK DOWN.  Do not use on open wounds or open sores. Avoid contact with your eyes, ears, mouth and genitals (private parts). Wash Face and genitals (private parts)  with your normal soap.   6. Wash thoroughly, paying special attention to the area where your surgery will be performed.  7. Thoroughly rinse your body with warm water from the neck down.  8. DO NOT shower/wash with your normal soap after using and rinsing off the CHG Soap.  9. Pat yourself dry with a CLEAN TOWEL.  10. Wear  CLEAN PAJAMAS to bed the night before surgery, wear comfortable clothes the morning of surgery  11. Place CLEAN SHEETS on your bed the night of your first shower and DO NOT SLEEP WITH PETS.   Day of Surgery:   Do not apply any deodorants/lotions.  Please wear clean clothes to the hospital/surgery center.   Remember to brush your teeth WITH YOUR REGULAR TOOTHPASTE.   Please read over the following fact sheets that you were given.

## 2019-09-01 ENCOUNTER — Other Ambulatory Visit: Payer: Self-pay

## 2019-09-01 ENCOUNTER — Other Ambulatory Visit (HOSPITAL_COMMUNITY)
Admission: RE | Admit: 2019-09-01 | Discharge: 2019-09-01 | Disposition: A | Discharge status: Home / Self Care | Payer: Medicare Other | Source: Ambulatory Visit | Attending: Surgery | Admitting: Surgery

## 2019-09-01 ENCOUNTER — Encounter (HOSPITAL_COMMUNITY)
Admission: RE | Admit: 2019-09-01 | Discharge: 2019-09-01 | Disposition: A | Discharge status: Home / Self Care | Payer: Medicare Other | Source: Ambulatory Visit | Attending: Surgery | Admitting: Surgery

## 2019-09-01 ENCOUNTER — Encounter (HOSPITAL_COMMUNITY): Payer: Self-pay

## 2019-09-01 DIAGNOSIS — Z01812 Encounter for preprocedural laboratory examination: Secondary | ICD-10-CM | POA: Insufficient documentation

## 2019-09-01 DIAGNOSIS — C50912 Malignant neoplasm of unspecified site of left female breast: Secondary | ICD-10-CM

## 2019-09-01 DIAGNOSIS — Z20822 Contact with and (suspected) exposure to covid-19: Secondary | ICD-10-CM | POA: Insufficient documentation

## 2019-09-01 DIAGNOSIS — Z170 Estrogen receptor positive status [ER+]: Secondary | ICD-10-CM | POA: Diagnosis not present

## 2019-09-01 HISTORY — DX: Malignant (primary) neoplasm, unspecified: C80.1

## 2019-09-01 LAB — CBC WITH DIFFERENTIAL/PLATELET
Abs Immature Granulocytes: 0.01 10*3/uL (ref 0.00–0.07)
Basophils Absolute: 0.1 10*3/uL (ref 0.0–0.1)
Basophils Relative: 1 %
Eosinophils Absolute: 0.2 10*3/uL (ref 0.0–0.5)
Eosinophils Relative: 2 %
HCT: 38.7 % (ref 36.0–46.0)
Hemoglobin: 12 g/dL (ref 12.0–15.0)
Immature Granulocytes: 0 %
Lymphocytes Relative: 13 %
Lymphs Abs: 1 10*3/uL (ref 0.7–4.0)
MCH: 27.7 pg (ref 26.0–34.0)
MCHC: 31 g/dL (ref 30.0–36.0)
MCV: 89.4 fL (ref 80.0–100.0)
Monocytes Absolute: 0.6 10*3/uL (ref 0.1–1.0)
Monocytes Relative: 8 %
Neutro Abs: 5.4 10*3/uL (ref 1.7–7.7)
Neutrophils Relative %: 76 %
Platelets: 214 10*3/uL (ref 150–400)
RBC: 4.33 MIL/uL (ref 3.87–5.11)
RDW: 13.4 % (ref 11.5–15.5)
WBC: 7.1 10*3/uL (ref 4.0–10.5)
nRBC: 0 % (ref 0.0–0.2)

## 2019-09-01 LAB — COMPREHENSIVE METABOLIC PANEL
ALT: 13 U/L (ref 0–44)
AST: 19 U/L (ref 15–41)
Albumin: 4 g/dL (ref 3.5–5.0)
Alkaline Phosphatase: 83 U/L (ref 38–126)
Anion gap: 9 (ref 5–15)
BUN: 13 mg/dL (ref 8–23)
CO2: 29 mmol/L (ref 22–32)
Calcium: 9.6 mg/dL (ref 8.9–10.3)
Chloride: 100 mmol/L (ref 98–111)
Creatinine, Ser: 1.06 mg/dL — ABNORMAL HIGH (ref 0.44–1.00)
GFR calc Af Amer: 59 mL/min — ABNORMAL LOW (ref >60)
GFR calc non Af Amer: 51 mL/min — ABNORMAL LOW (ref >60)
Glucose, Bld: 94 mg/dL (ref 70–99)
Potassium: 3.6 mmol/L (ref 3.5–5.1)
Sodium: 138 mmol/L (ref 135–145)
Total Bilirubin: 0.7 mg/dL (ref 0.3–1.2)
Total Protein: 7.2 g/dL (ref 6.5–8.1)

## 2019-09-01 LAB — SARS CORONAVIRUS 2 (TAT 6-24 HRS): SARS Coronavirus 2: NEGATIVE

## 2019-09-01 NOTE — Progress Notes (Signed)
PCP:  Andree Moro, MD Cardiologist:  Truitt Merle, NP  EKG:  02/28/19 CXR:  01/06/05 ECHO:  Denies Stress Test:  2004 Cardiac Cath: Denies  Covid test 09/01/19  Patient denies shortness of breath, fever, cough, and chest pain at PAT appointment.  Patient verbalized understanding of instructions provided today at the PAT appointment.  Patient asked to review instructions at home and day of surgery.

## 2019-09-05 ENCOUNTER — Encounter (HOSPITAL_COMMUNITY): Payer: Self-pay | Admitting: Surgery

## 2019-09-05 ENCOUNTER — Ambulatory Visit (HOSPITAL_COMMUNITY): Payer: Medicare Other | Admitting: Anesthesiology

## 2019-09-05 ENCOUNTER — Ambulatory Visit (HOSPITAL_COMMUNITY)
Admission: RE | Admit: 2019-09-05 | Discharge: 2019-09-05 | Disposition: A | Discharge status: Home / Self Care | Payer: Medicare Other | Attending: Surgery | Admitting: Surgery

## 2019-09-05 ENCOUNTER — Ambulatory Visit
Admission: RE | Admit: 2019-09-05 | Discharge: 2019-09-05 | Disposition: A | Discharge status: Home / Self Care | Payer: Medicare Other | Source: Ambulatory Visit | Attending: Surgery | Admitting: Surgery

## 2019-09-05 ENCOUNTER — Ambulatory Visit (HOSPITAL_COMMUNITY)
Admission: RE | Admit: 2019-09-05 | Discharge: 2019-09-05 | Disposition: A | Discharge status: Home / Self Care | Payer: Medicare Other | Source: Ambulatory Visit | Attending: Surgery | Admitting: Surgery

## 2019-09-05 ENCOUNTER — Other Ambulatory Visit: Payer: Self-pay

## 2019-09-05 ENCOUNTER — Encounter (HOSPITAL_COMMUNITY)
Admission: RE | Disposition: A | Discharge status: Home / Self Care | Payer: Self-pay | Source: Home / Self Care | Attending: Surgery

## 2019-09-05 DIAGNOSIS — N6459 Other signs and symptoms in breast: Secondary | ICD-10-CM | POA: Diagnosis not present

## 2019-09-05 DIAGNOSIS — D0512 Intraductal carcinoma in situ of left breast: Secondary | ICD-10-CM | POA: Diagnosis not present

## 2019-09-05 DIAGNOSIS — Z170 Estrogen receptor positive status [ER+]: Secondary | ICD-10-CM

## 2019-09-05 DIAGNOSIS — N641 Fat necrosis of breast: Secondary | ICD-10-CM | POA: Diagnosis not present

## 2019-09-05 DIAGNOSIS — C50912 Malignant neoplasm of unspecified site of left female breast: Secondary | ICD-10-CM

## 2019-09-05 DIAGNOSIS — Z87891 Personal history of nicotine dependence: Secondary | ICD-10-CM | POA: Diagnosis not present

## 2019-09-05 DIAGNOSIS — N6031 Fibrosclerosis of right breast: Secondary | ICD-10-CM | POA: Diagnosis not present

## 2019-09-05 DIAGNOSIS — D0511 Intraductal carcinoma in situ of right breast: Secondary | ICD-10-CM | POA: Insufficient documentation

## 2019-09-05 DIAGNOSIS — I10 Essential (primary) hypertension: Secondary | ICD-10-CM | POA: Diagnosis not present

## 2019-09-05 DIAGNOSIS — C50911 Malignant neoplasm of unspecified site of right female breast: Secondary | ICD-10-CM | POA: Diagnosis not present

## 2019-09-05 DIAGNOSIS — G8918 Other acute postprocedural pain: Secondary | ICD-10-CM | POA: Diagnosis not present

## 2019-09-05 DIAGNOSIS — C50512 Malignant neoplasm of lower-outer quadrant of left female breast: Secondary | ICD-10-CM | POA: Diagnosis not present

## 2019-09-05 HISTORY — PX: BREAST LUMPECTOMY WITH RADIOACTIVE SEED AND SENTINEL LYMPH NODE BIOPSY: SHX6550

## 2019-09-05 HISTORY — PX: BREAST LUMPECTOMY: SHX2

## 2019-09-05 IMAGING — MG MM BREAST SURGICAL SPECIMEN
2 series · 2 of 2 positions shown · non-contrast
Comparison: Previous exam(s).

CLINICAL DATA: Two left lumpectomies for recently diagnosed left
breast cancer, 1 with a ribbon shaped clip in the other with a coil
shaped clip. The current specimen is for the coil shaped clip.

EXAM:
SPECIMEN RADIOGRAPH OF THE LEFT BREAST

[L (1 of 2)]
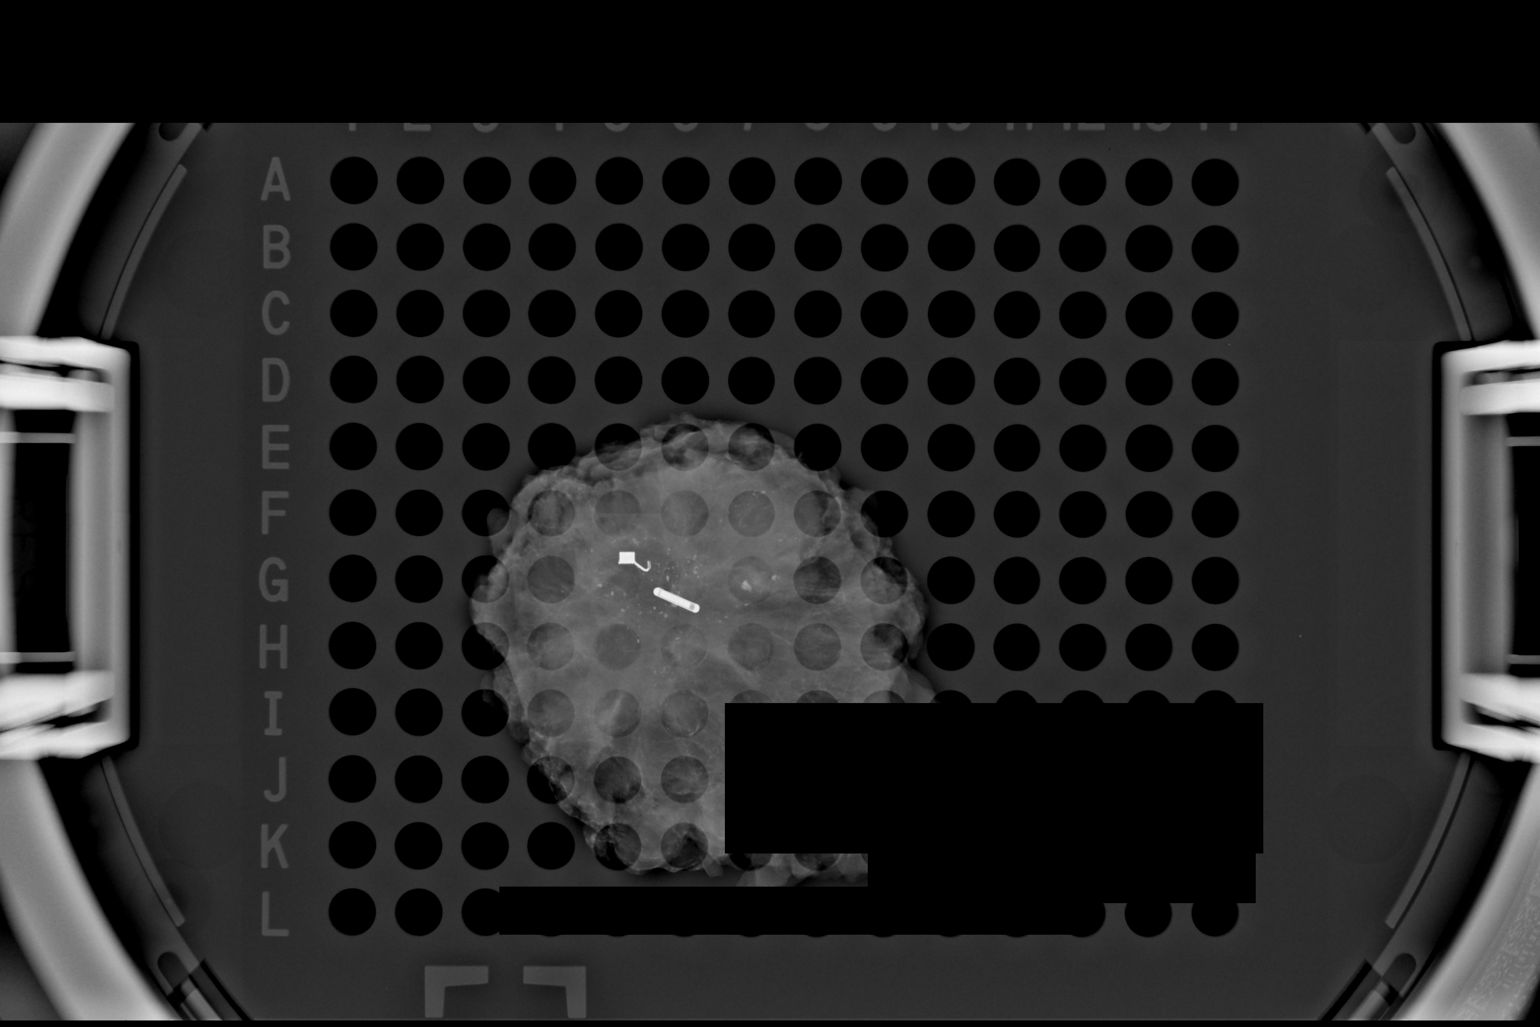

[L (2 of 2)]
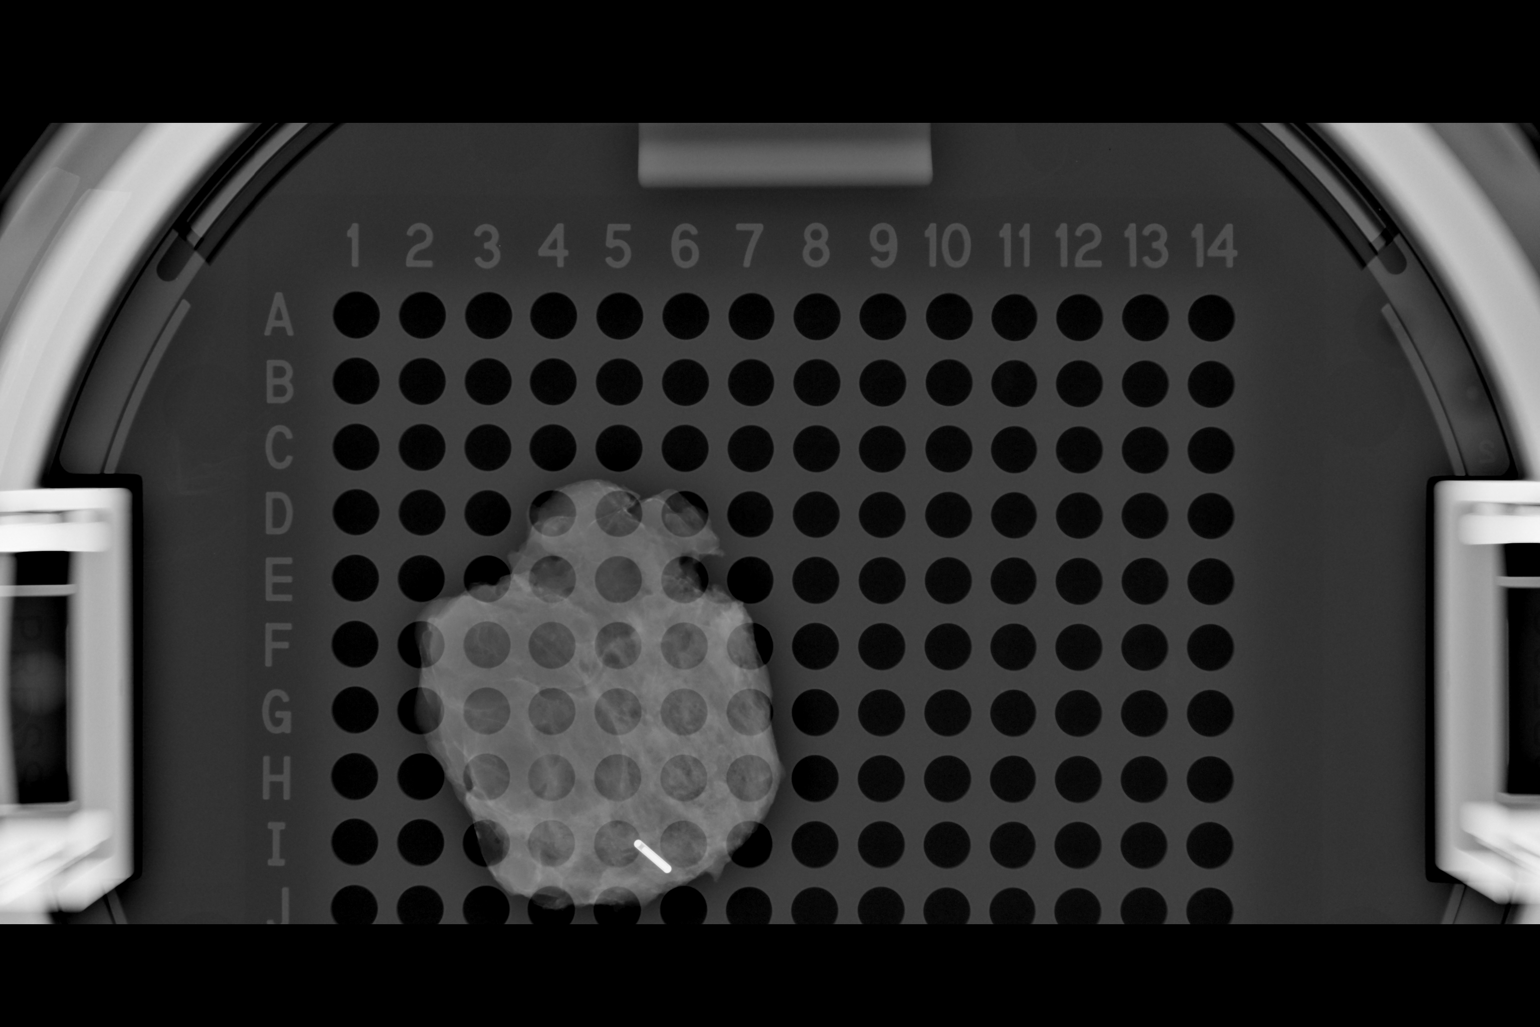

[2 of 2 positions shown; findings below may reference images not displayed]

FINDINGS: Status post excision of the left breast. The radioactive seed and
coil shaped biopsy marker clip are present, completely intact, and
were marked for pathology. There is also a mass with distortion and
multiple calcifications within the specimen.
IMPRESSION: Specimen radiograph of the left breast.

## 2019-09-05 IMAGING — MG MM BREAST SURGICAL SPECIMEN
2 series · 2 of 2 positions shown · non-contrast
Comparison: Previous exam(s).

CLINICAL DATA: Right lumpectomy for right breast cancer. A
radioactive seed was placed approximately 1 cm lateral to the
dumbbell-shaped biopsy marker clip with a goal of including the area
marked as well as the clip in the specimen.

EXAM:
SPECIMEN RADIOGRAPH OF THE RIGHT BREAST

[R (1 of 2)]
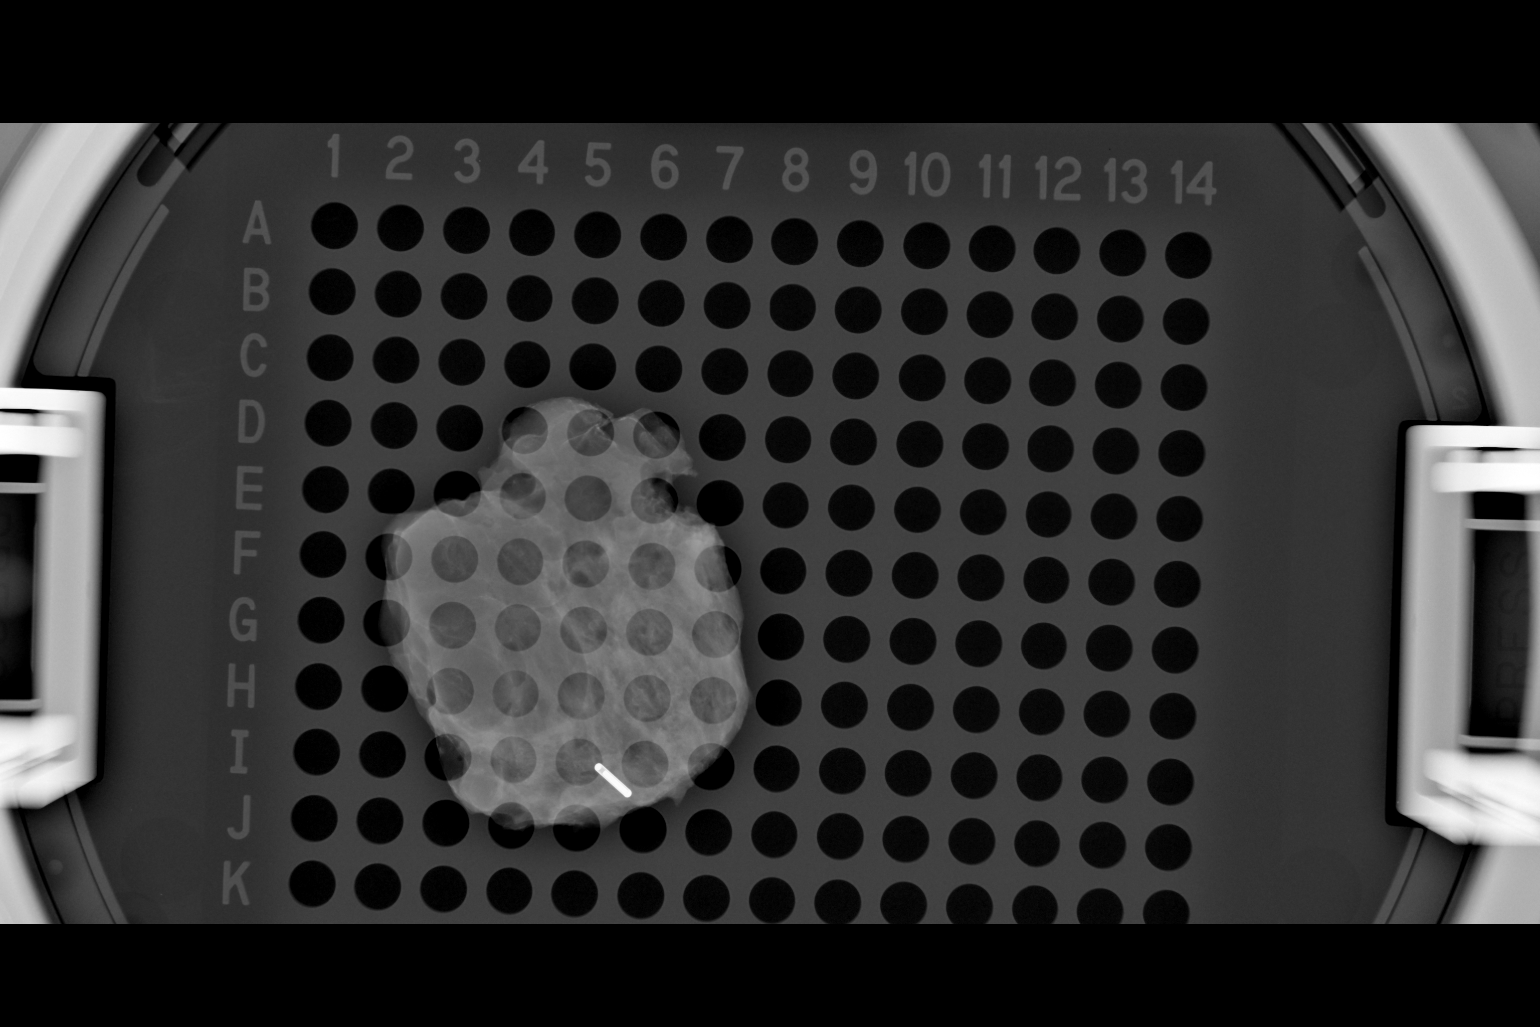

[R (2 of 2)]
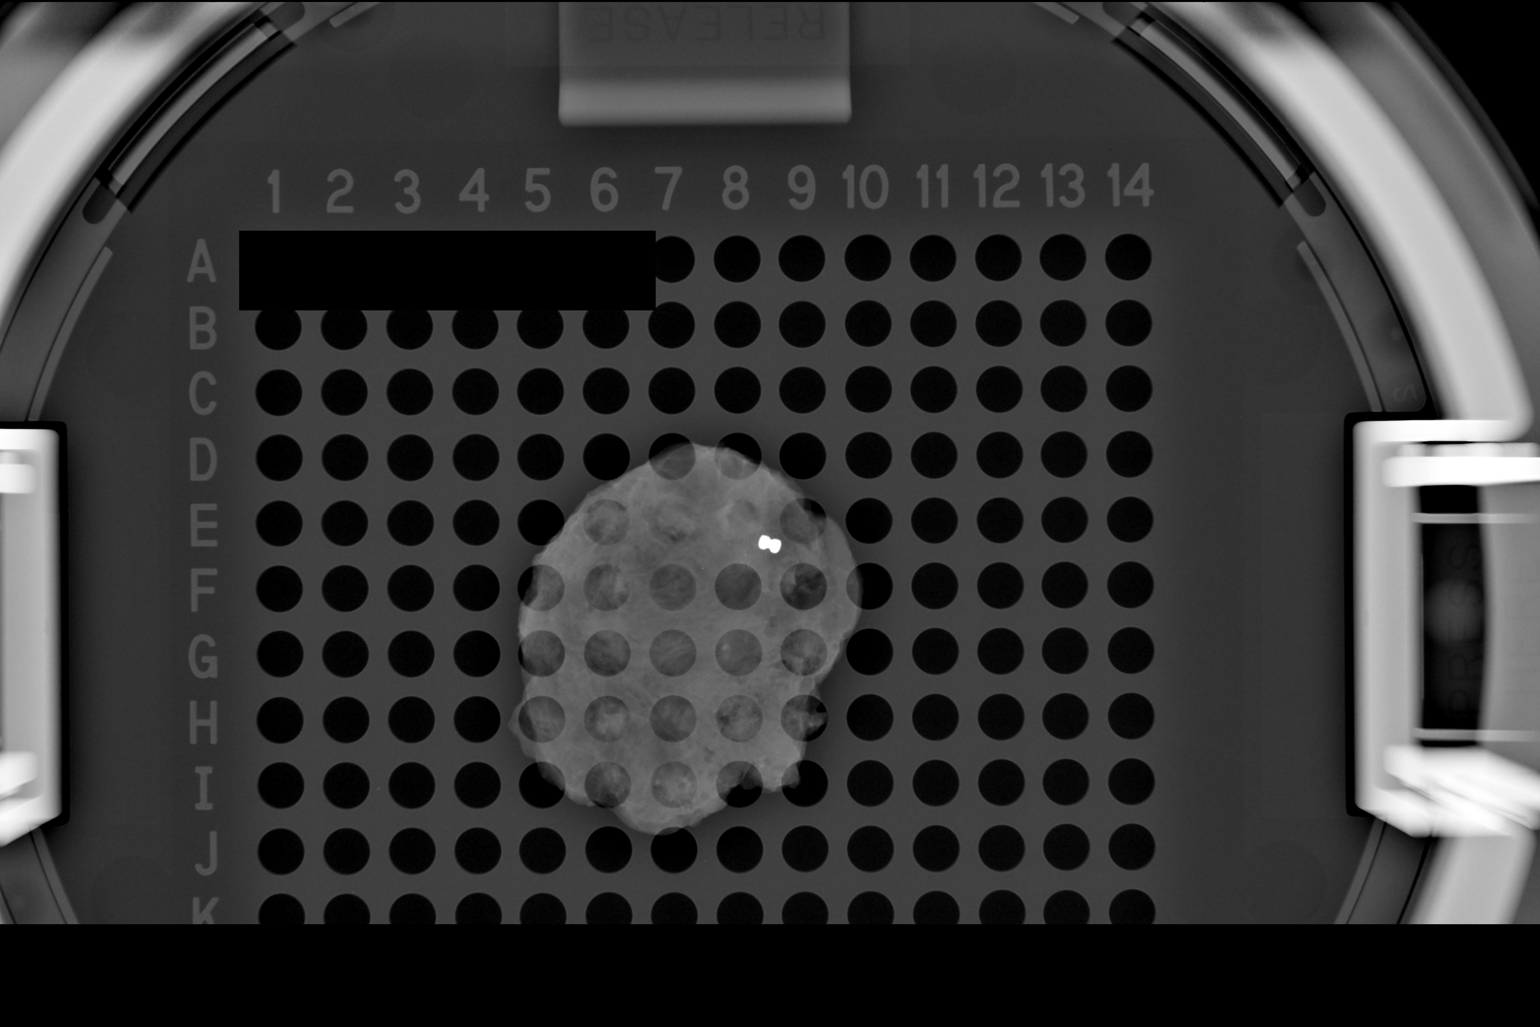

[2 of 2 positions shown; findings below may reference images not displayed]

FINDINGS: Status post excision of the right breast. The radioactive seed and
biopsy marker clip are present in 2 separate specimens, completely
intact, and were marked for pathology.
IMPRESSION: Specimen radiographs of the right breast.

## 2019-09-05 IMAGING — MG MM PLC BREAST LOC DEV 1ST LESION INC*R*
8 of 12 series · 8 of 20 positions shown · non-contrast
Comparison: Previous exam(s).

CLINICAL DATA: Patient for preoperative localization prior to left
and right lumpectomies. There are 2 sites within the left breast and
1 site within the right breast. The biopsy marking clip within the
right breast was located slightly medial to the MRI identified mass.
The seed was placed slightly lateral to the biopsy marking clip
within the right breast.

EXAM:
MAMMOGRAPHIC GUIDED RADIOACTIVE SEED LOCALIZATION OF THE BILATERAL
BREAST

[R CC (1 of 5)]
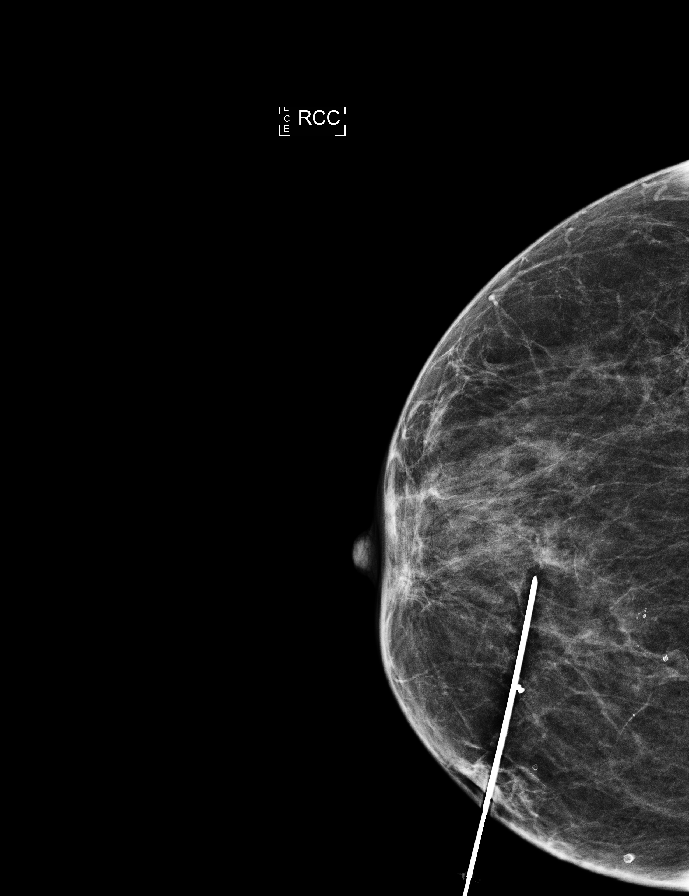

[R CC (2 of 5)]
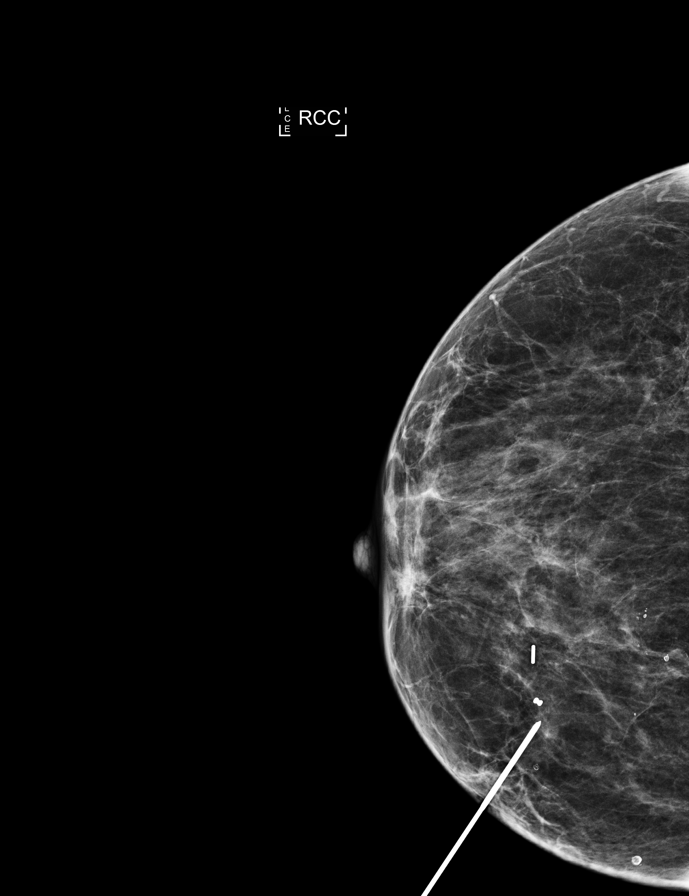

[R CC (3 of 5)]
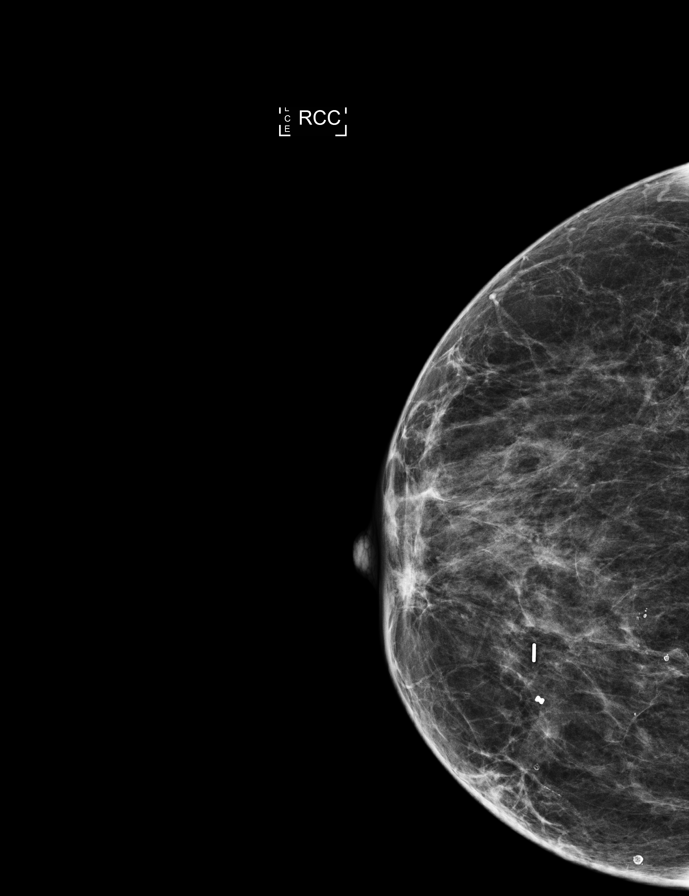

[R ML (1 of 3)]
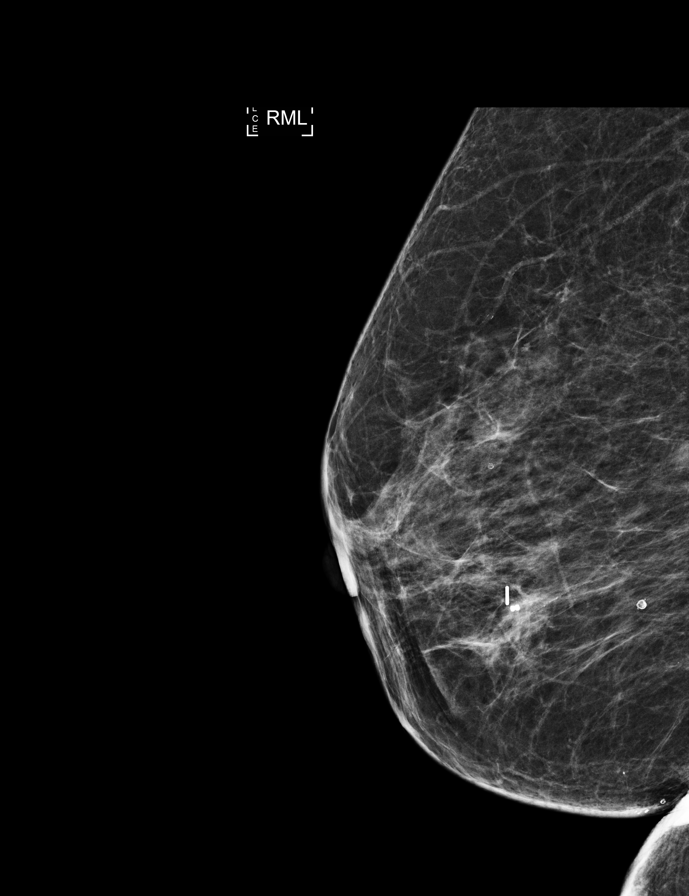

[R ML (2 of 3)]
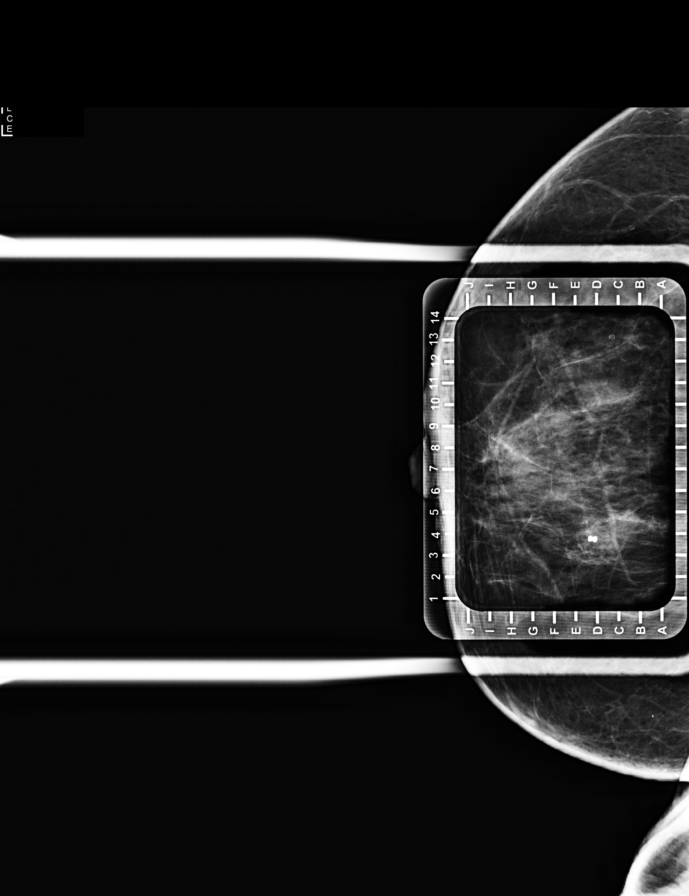

[R ML (3 of 3)]
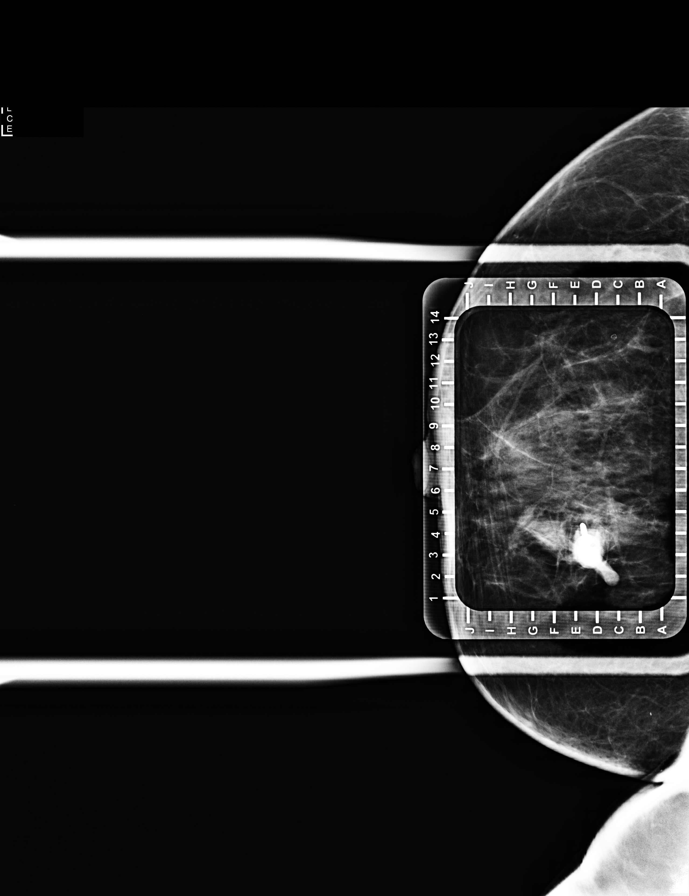

[R CC (4 of 5)]
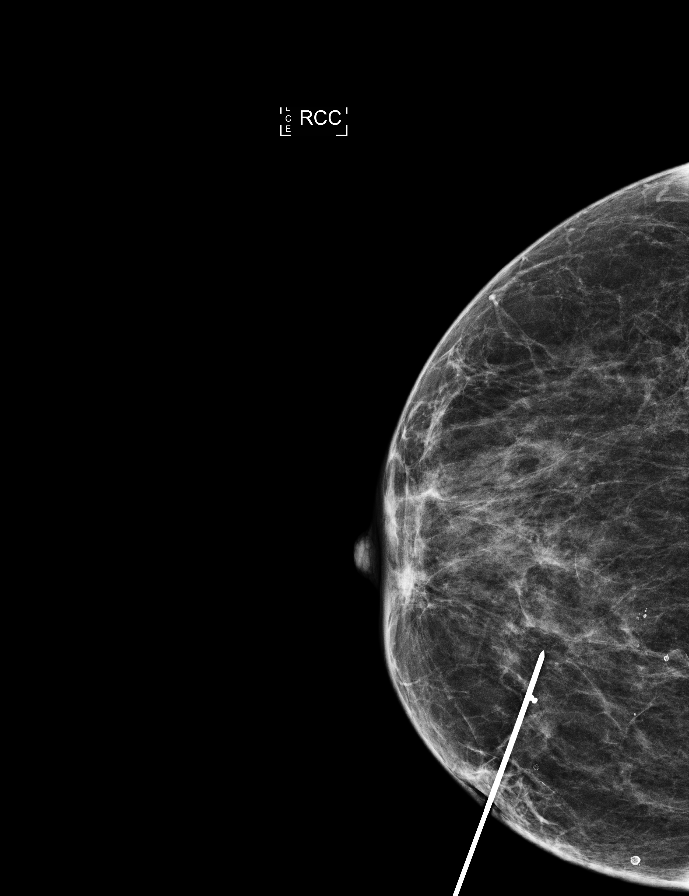

[R CC (5 of 5)]
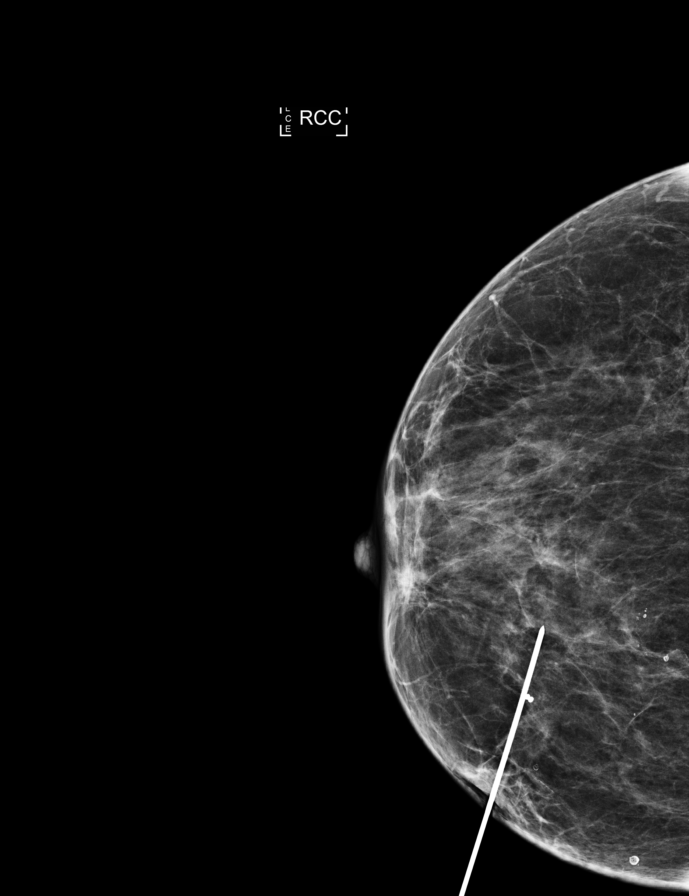

[8 of 20 positions shown; findings below may reference images not displayed]

FINDINGS: Patient presents for radioactive seed localization prior to
bilateral lumpectomies. I met with the patient and we discussed the
procedure of seed localization including benefits and alternatives.
We discussed the high likelihood of a successful procedure. We
discussed the risks of the procedure including infection, bleeding,
tissue injury and further surgery. We discussed the low dose of
radioactivity involved in the procedure. Informed, written consent
was given.

The usual time-out protocol was performed immediately prior to the
procedure.

Site 1: Right breast: Dumbbell shaped clip. Note the seed was placed
approximately 1 cm lateral to the dumbbell-shaped clip given clip
placement after MRI guided biopsy.

Using mammographic guidance, sterile technique, 1% lidocaine and an
[BD] radioactive seed, dumbbell-shaped clip was localized using a
medial approach. The follow-up mammogram images confirm the seed in
the expected location and were marked for Dr. DULAY.

Follow-up survey of the patient confirms presence of the radioactive
seed.

Order number of [BD] seed:  [PHONE_NUMBER].

Total activity:  0.255 millicuries reference Date: [DATE]

Site 2: Left breast: Coil shaped clip.

Using mammographic guidance, sterile technique, 1% lidocaine and an
[BD] radioactive seed, mass and coil shaped clip was localized
using a medial approach. The follow-up mammogram images confirm the
seed in the expected location and were marked for Dr. DULAY.

Follow-up survey of the patient confirms presence of the radioactive
seed.

Order number of [BD] seed:  [PHONE_NUMBER].

Total activity:  0.251 millicuries reference Date: [DATE]

Site 3: Left breast: Ribbon shaped clip.

Using mammographic guidance, sterile technique, 1% lidocaine and an
[BD] radioactive seed, ribbon shaped clip was localized using a
cranial approach. The follow-up mammogram images confirm the seed in
the expected location and were marked for Dr. DULAY.

Follow-up survey of the patient confirms presence of the radioactive
seed.

Order number of [BD] seed:  [PHONE_NUMBER].

Total activity:  0.247 millicuries reference Date: [DATE]

The patient tolerated the procedure well and was released from the
[REDACTED]. She was given instructions regarding seed removal.
IMPRESSION: Radioactive seed localization bilateral breast. No apparent
complications.

Note that the radioactive seed within the right breast was placed
approximately 1 is cm lateral to the site of the biopsy marking clip
as the clip was felt to be approximately 1.5 cm lateral to the MRI
identified mass. Recommend removal of both the radioactive seed and
the biopsy marking clip at the time of surgery.

## 2019-09-05 IMAGING — MG MM BREAST SURGICAL SPECIMEN
4 series · 4 of 4 positions shown · non-contrast
Comparison: Previous exam(s).

CLINICAL DATA: Two left lumpectomies for recently diagnosed breast
cancer, 1 marked with a ribbon shaped clip and the other marked coil
shaped clip. The current images are for the specimen containing the
ribbon shaped clip.

EXAM:
SPECIMEN RADIOGRAPH OF THE LEFT BREAST

[L (1 of 4)]
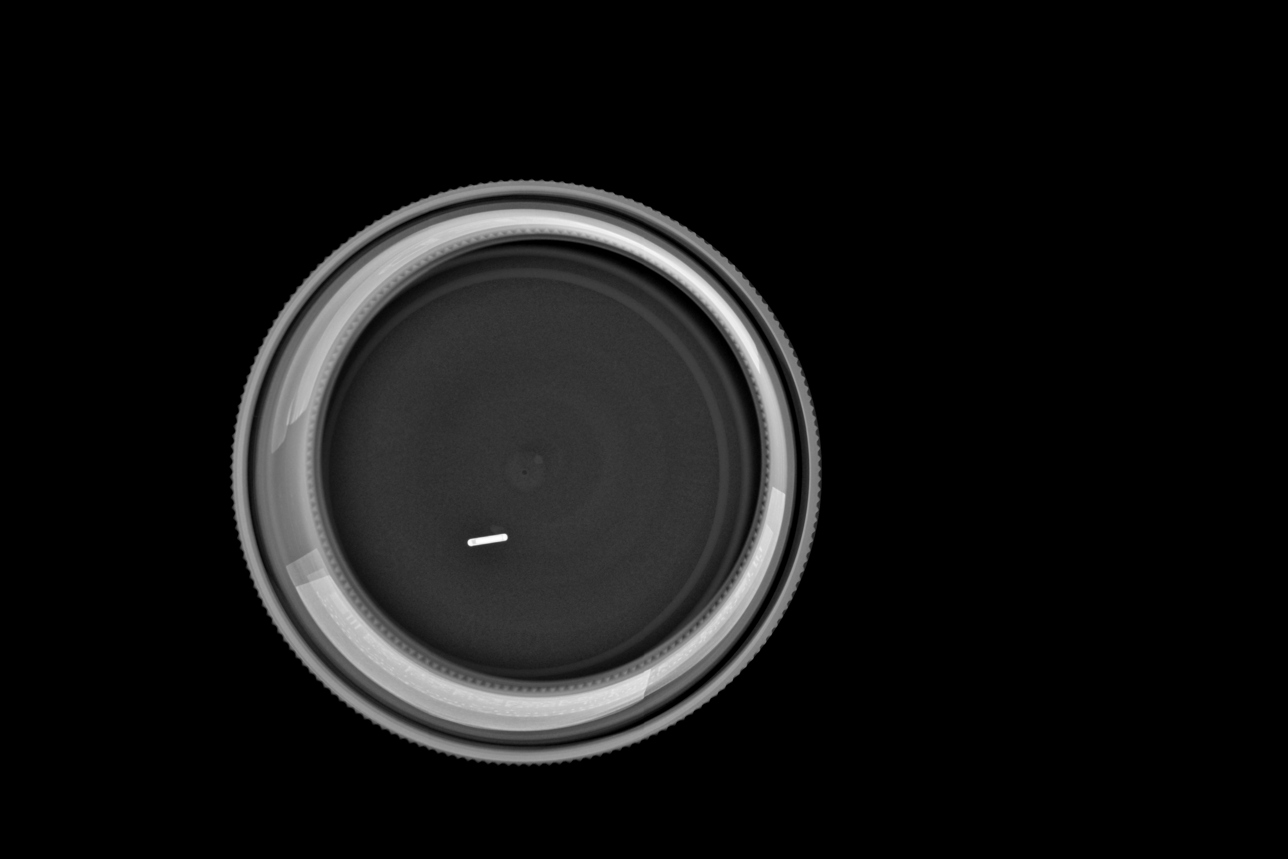

[L (2 of 4)]
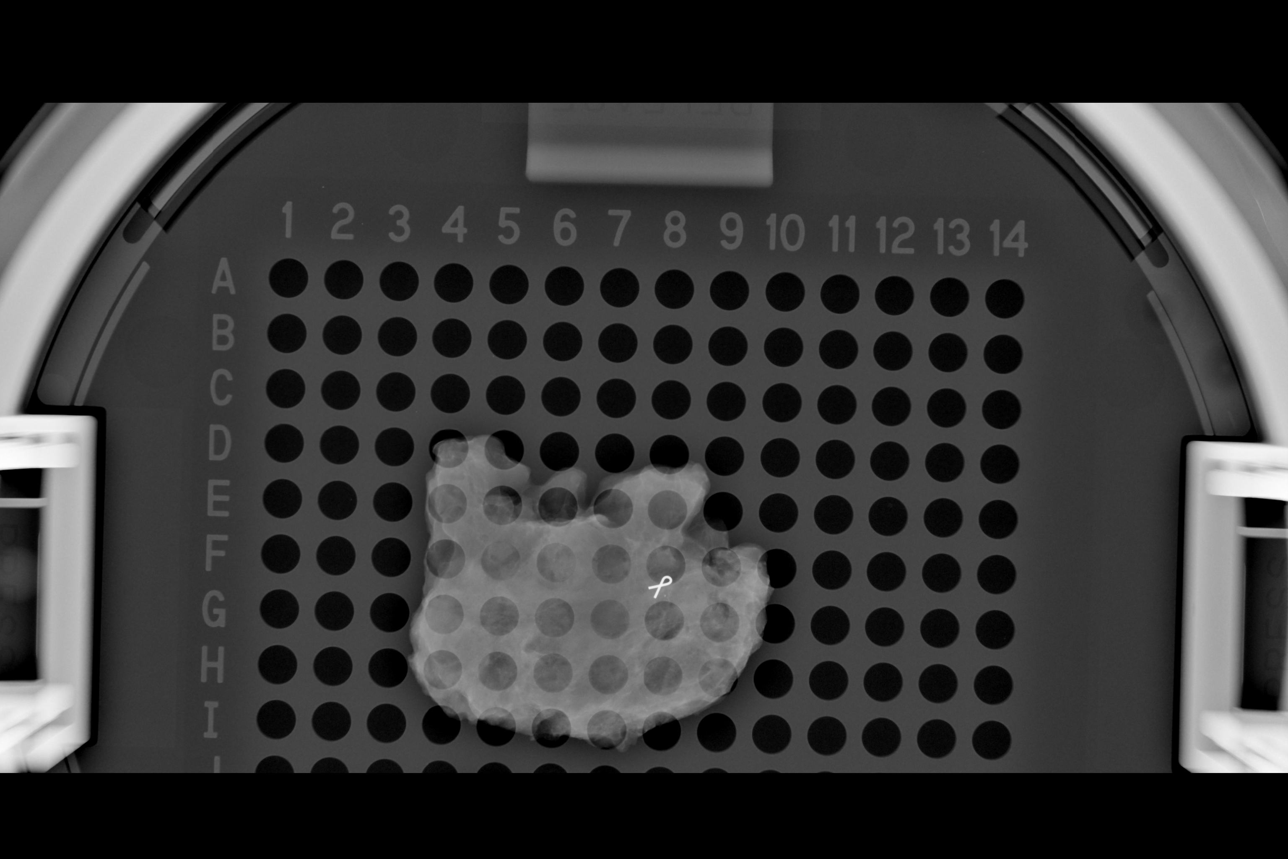

[L (3 of 4)]
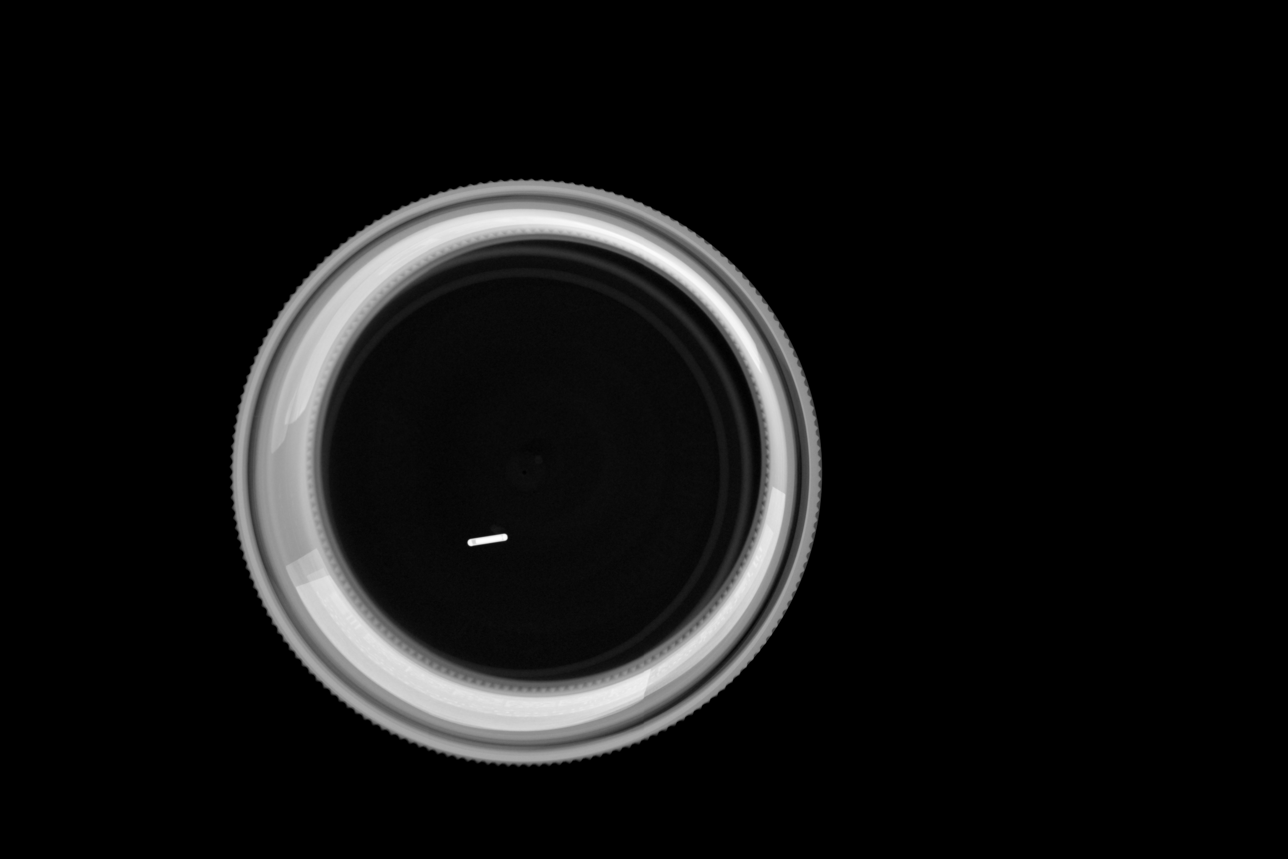

[L (4 of 4)]
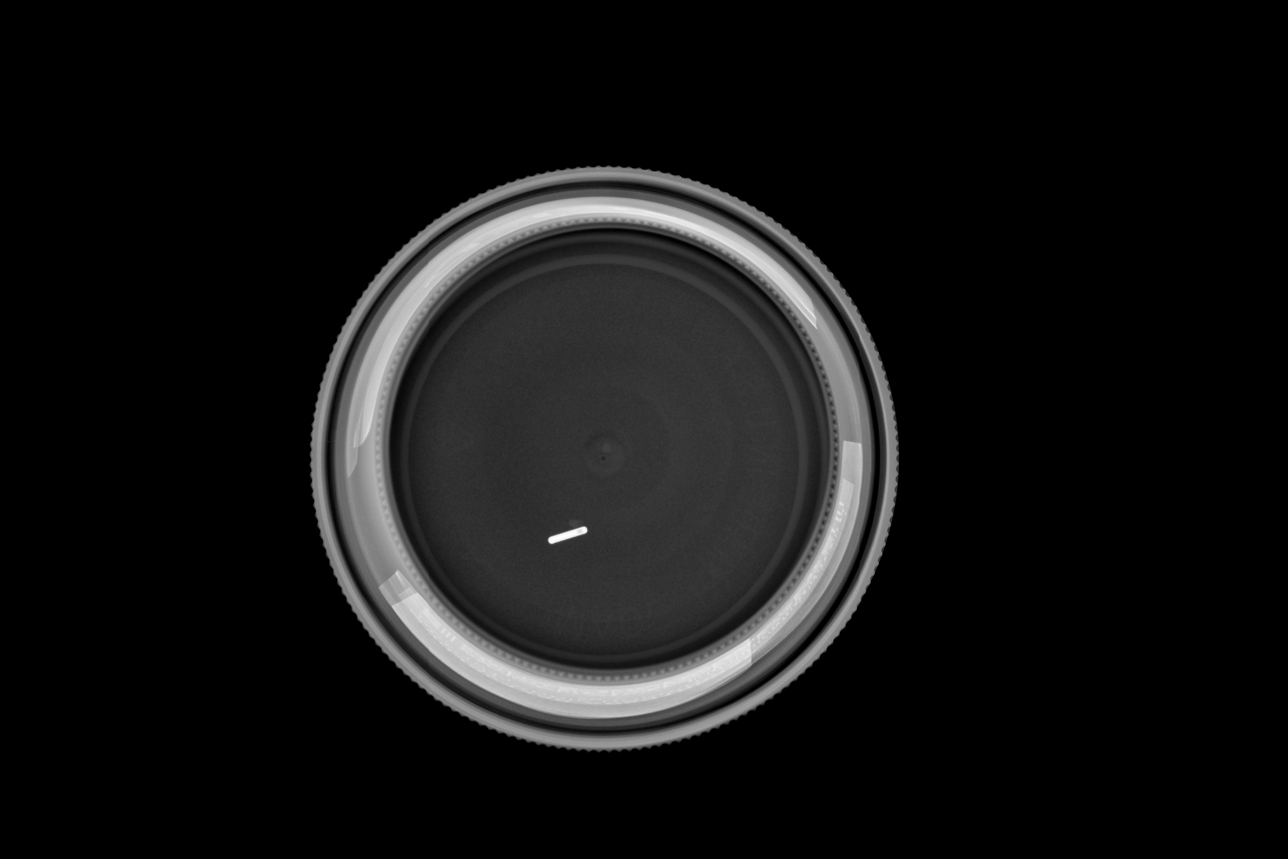

[4 of 4 positions shown; findings below may reference images not displayed]

FINDINGS: Status post excision of the left breast. The ribbon shaped biopsy
marker clip is present in the specimen, completely intact, and
marked for pathology. The radioactive seed is in a separately imaged
specimen cup.
IMPRESSION: Specimen radiograph of the left breast.

## 2019-09-05 IMAGING — MG MM PLC BREAST LOC DEV EA ADD LESION INC MAMMO GUIDE*L*
8 of 15 series · 8 of 15 positions shown · non-contrast
Comparison: Previous exam(s).

CLINICAL DATA: Patient for preoperative localization prior to left
and right lumpectomies. There are 2 sites within the left breast and
1 site within the right breast. The biopsy marking clip within the
right breast was located slightly medial to the MRI identified mass.
The seed was placed slightly lateral to the biopsy marking clip
within the right breast.

EXAM:
MAMMOGRAPHIC GUIDED RADIOACTIVE SEED LOCALIZATION OF THE BILATERAL
BREAST

[L ML (1 of 6)]
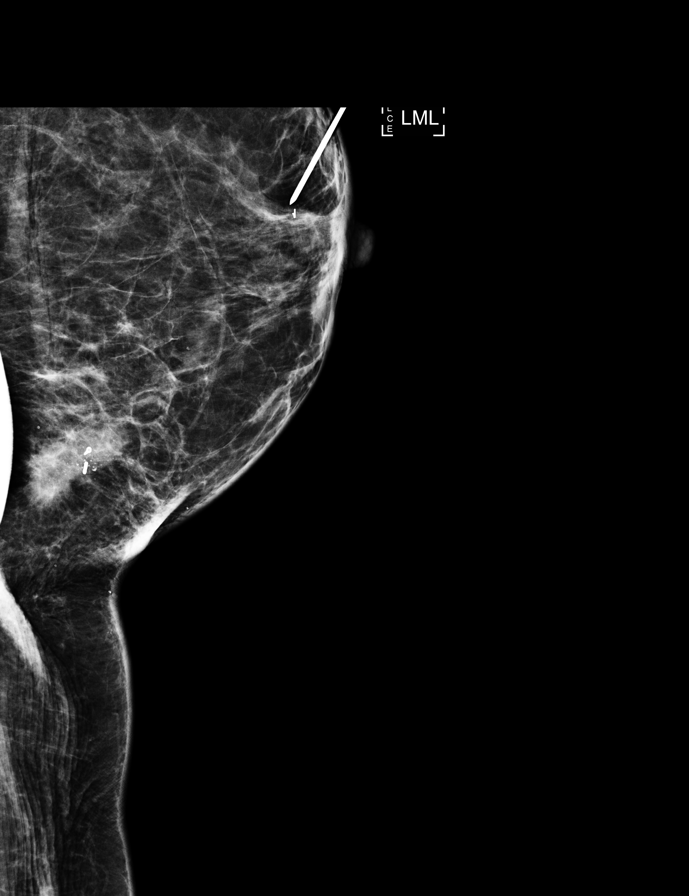

[L ML (2 of 6)]
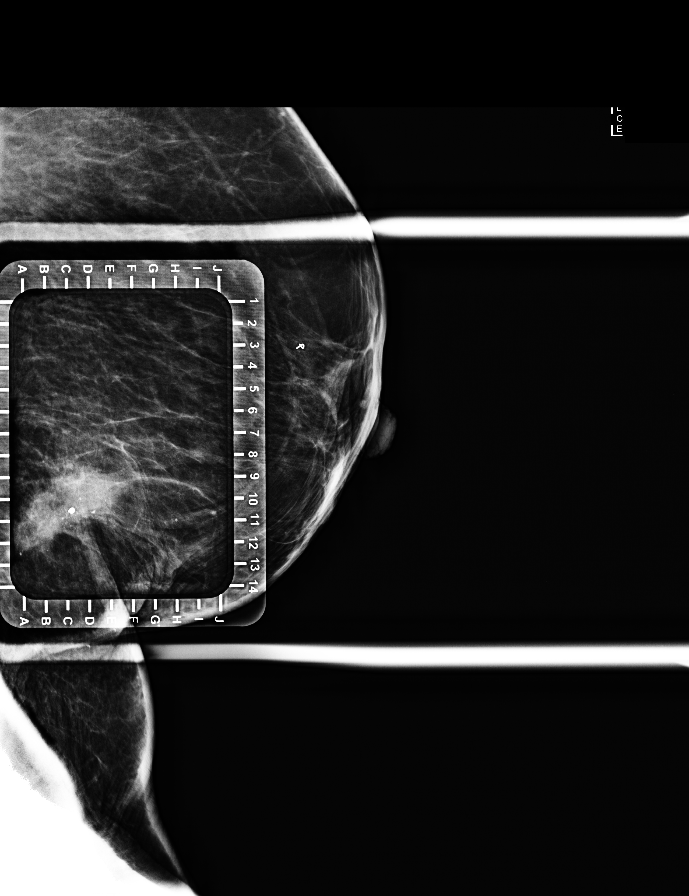

[L CC (1 of 2)]
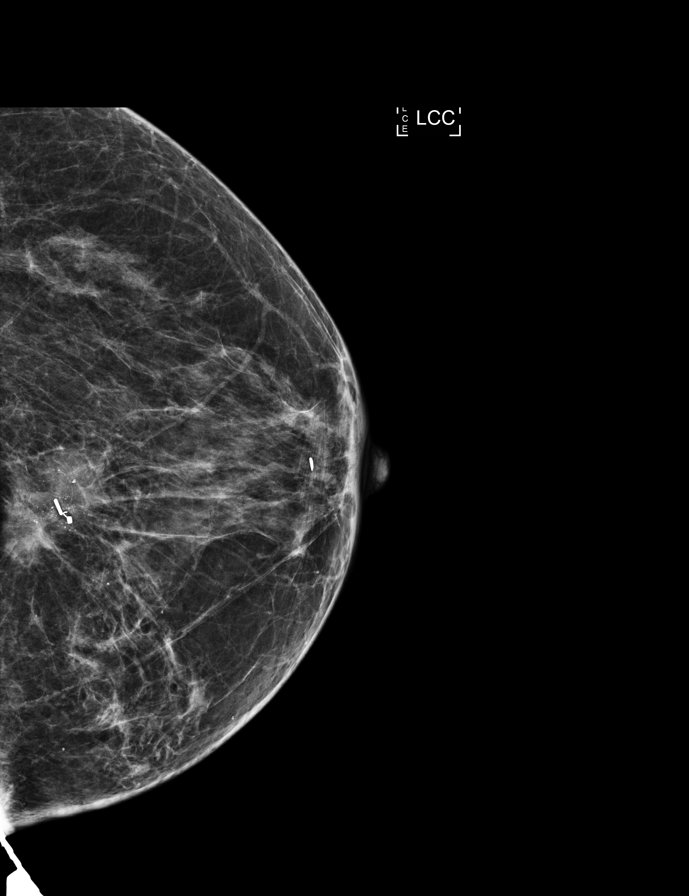

[L ML (3 of 6)]
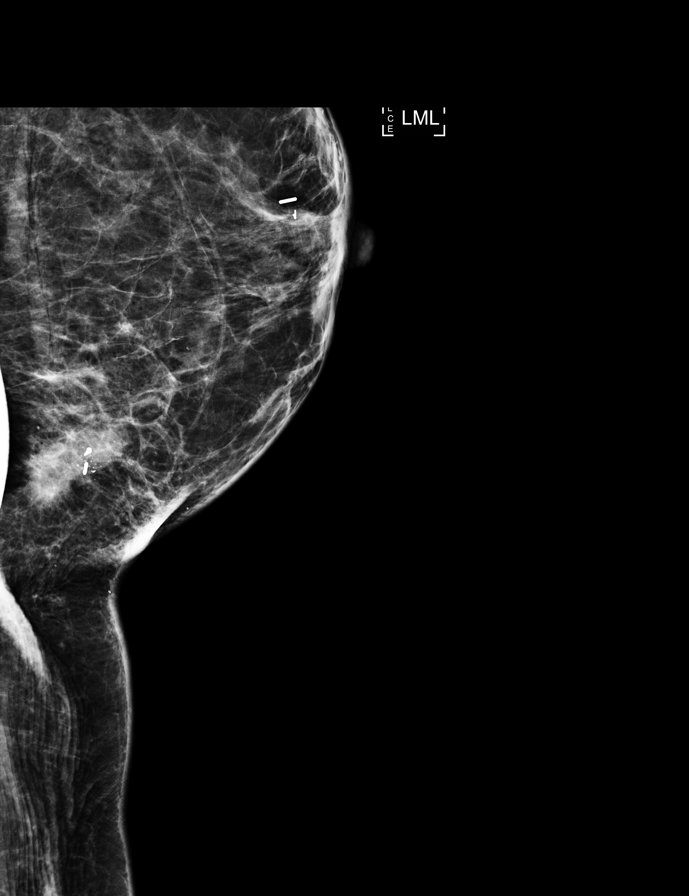

[L ML (4 of 6)]
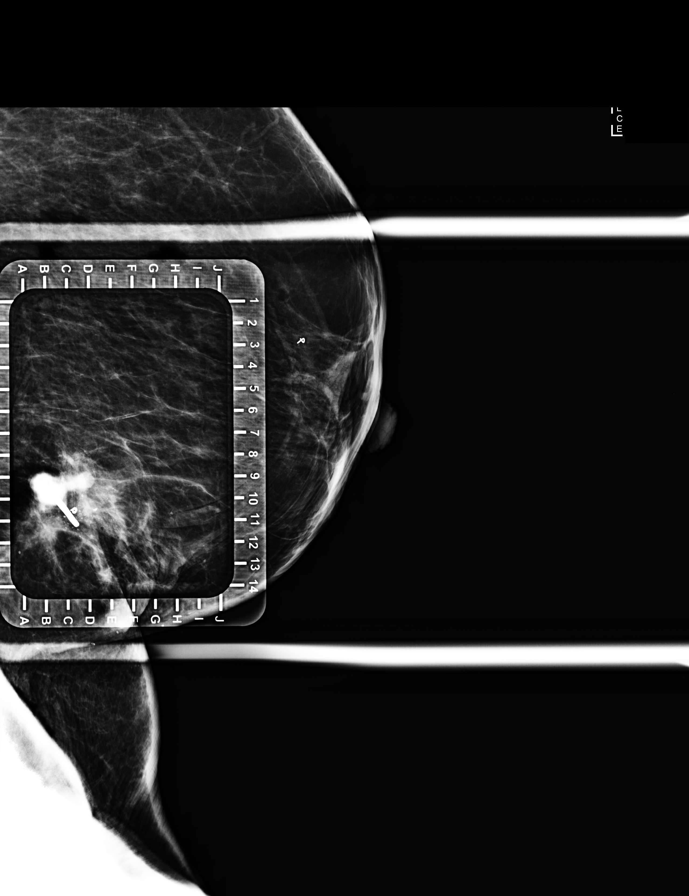

[L CC (2 of 2)]
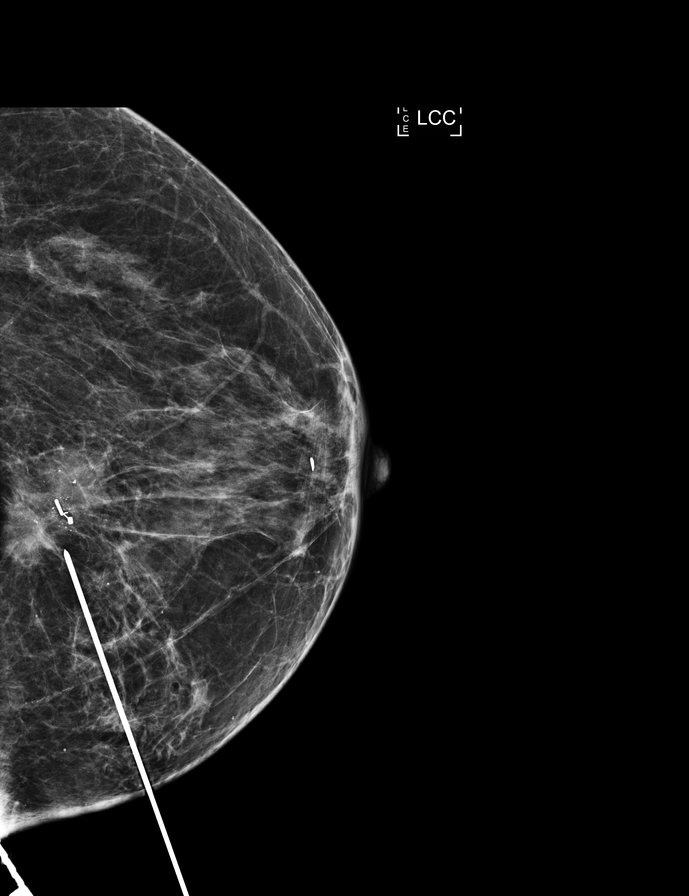

[L ML (5 of 6)]
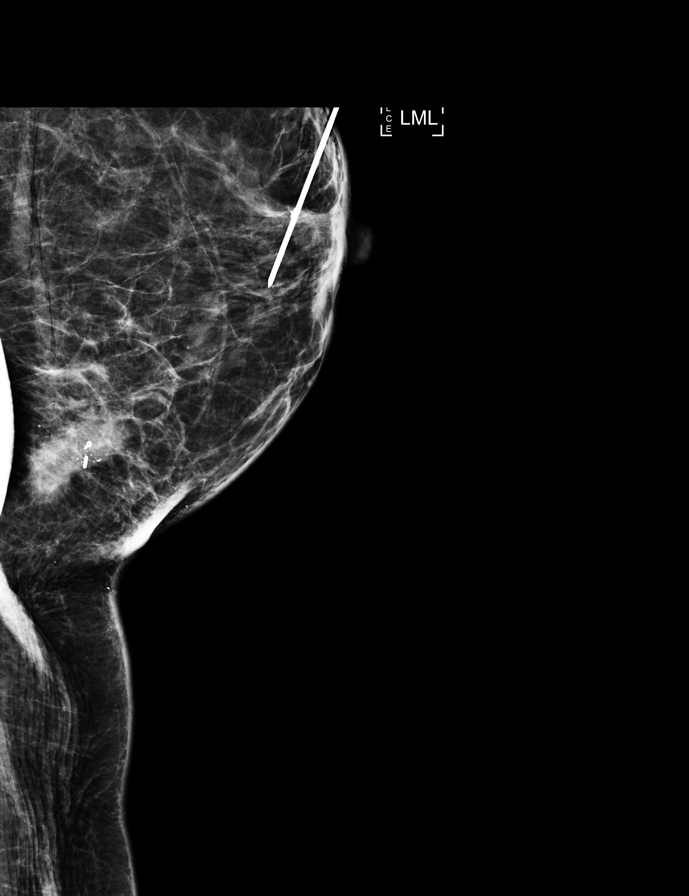

[L ML (6 of 6)]
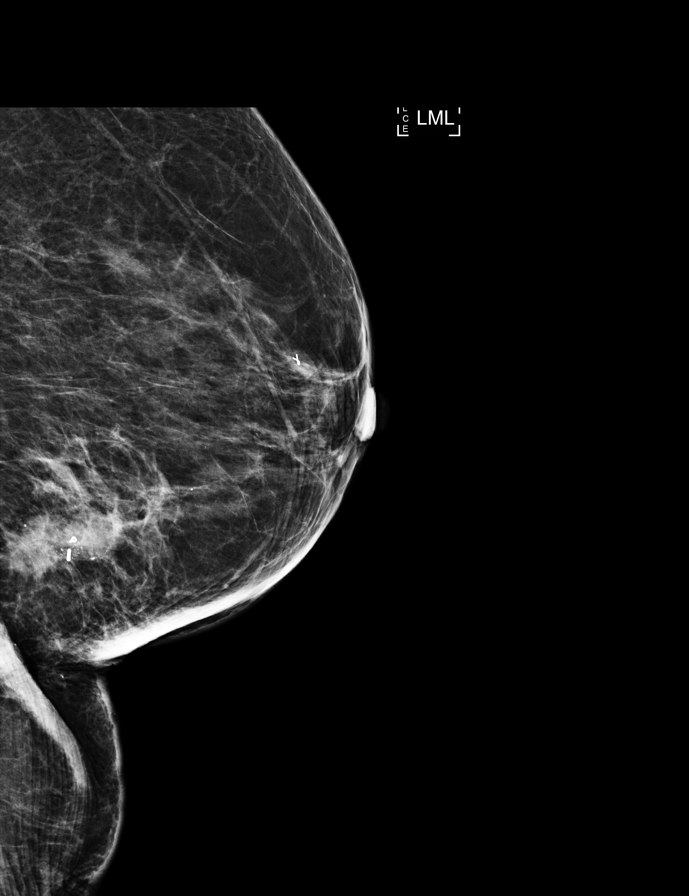

[8 of 15 positions shown; findings below may reference images not displayed]

FINDINGS: Patient presents for radioactive seed localization prior to
bilateral lumpectomies. I met with the patient and we discussed the
procedure of seed localization including benefits and alternatives.
We discussed the high likelihood of a successful procedure. We
discussed the risks of the procedure including infection, bleeding,
tissue injury and further surgery. We discussed the low dose of
radioactivity involved in the procedure. Informed, written consent
was given.

The usual time-out protocol was performed immediately prior to the
procedure.

Site 1: Right breast: Dumbbell shaped clip. Note the seed was placed
approximately 1 cm lateral to the dumbbell-shaped clip given clip
placement after MRI guided biopsy.

Using mammographic guidance, sterile technique, 1% lidocaine and an
[BD] radioactive seed, dumbbell-shaped clip was localized using a
medial approach. The follow-up mammogram images confirm the seed in
the expected location and were marked for Dr. DULAY.

Follow-up survey of the patient confirms presence of the radioactive
seed.

Order number of [BD] seed:  [PHONE_NUMBER].

Total activity:  0.255 millicuries reference Date: [DATE]

Site 2: Left breast: Coil shaped clip.

Using mammographic guidance, sterile technique, 1% lidocaine and an
[BD] radioactive seed, mass and coil shaped clip was localized
using a medial approach. The follow-up mammogram images confirm the
seed in the expected location and were marked for Dr. DULAY.

Follow-up survey of the patient confirms presence of the radioactive
seed.

Order number of [BD] seed:  [PHONE_NUMBER].

Total activity:  0.251 millicuries reference Date: [DATE]

Site 3: Left breast: Ribbon shaped clip.

Using mammographic guidance, sterile technique, 1% lidocaine and an
[BD] radioactive seed, ribbon shaped clip was localized using a
cranial approach. The follow-up mammogram images confirm the seed in
the expected location and were marked for Dr. DULAY.

Follow-up survey of the patient confirms presence of the radioactive
seed.

Order number of [BD] seed:  [PHONE_NUMBER].

Total activity:  0.247 millicuries reference Date: [DATE]

The patient tolerated the procedure well and was released from the
[REDACTED]. She was given instructions regarding seed removal.
IMPRESSION: Radioactive seed localization bilateral breast. No apparent
complications.

Note that the radioactive seed within the right breast was placed
approximately 1 is cm lateral to the site of the biopsy marking clip
as the clip was felt to be approximately 1.5 cm lateral to the MRI
identified mass. Recommend removal of both the radioactive seed and
the biopsy marking clip at the time of surgery.

## 2019-09-05 SURGERY — BREAST LUMPECTOMY WITH RADIOACTIVE SEED AND SENTINEL LYMPH NODE BIOPSY
Anesthesia: General | Site: Breast

## 2019-09-05 MED ORDER — LACTATED RINGERS IV SOLN: INTRAVENOUS | Status: DC

## 2019-09-05 MED ORDER — ONDANSETRON HCL 4 MG/2ML IJ SOLN
INTRAMUSCULAR | Status: DC | PRN
  Administered 2019-09-05: 4 mg via INTRAVENOUS

## 2019-09-05 MED ORDER — PROPOFOL 10 MG/ML IV BOLUS
INTRAVENOUS | Status: AC
  Filled 2019-09-05: qty 20

## 2019-09-05 MED ORDER — MIDAZOLAM HCL 2 MG/2ML IJ SOLN
INTRAMUSCULAR | Status: AC
  Administered 2019-09-05: 1 mg via INTRAVENOUS
  Filled 2019-09-05: qty 2

## 2019-09-05 MED ORDER — ACETAMINOPHEN 500 MG PO TABS
1000.0000 mg | ORAL_TABLET | ORAL | Status: AC
  Administered 2019-09-05: 1000 mg via ORAL
  Filled 2019-09-05: qty 2

## 2019-09-05 MED ORDER — ONDANSETRON HCL 4 MG/2ML IJ SOLN: 4.0000 mg | Freq: Once | INTRAMUSCULAR | Status: DC | PRN

## 2019-09-05 MED ORDER — ONDANSETRON HCL 4 MG/2ML IJ SOLN
INTRAMUSCULAR | Status: AC
  Filled 2019-09-05: qty 2

## 2019-09-05 MED ORDER — CHLORHEXIDINE GLUCONATE CLOTH 2 % EX PADS: 6.0000 | MEDICATED_PAD | Freq: Once | CUTANEOUS | Status: DC

## 2019-09-05 MED ORDER — FENTANYL CITRATE (PF) 250 MCG/5ML IJ SOLN
INTRAMUSCULAR | Status: DC | PRN
  Administered 2019-09-05 (×4): 25 ug via INTRAVENOUS

## 2019-09-05 MED ORDER — BUPIVACAINE HCL (PF) 0.25 % IJ SOLN
INTRAMUSCULAR | Status: AC
  Filled 2019-09-05: qty 30

## 2019-09-05 MED ORDER — CLINDAMYCIN PHOSPHATE 900 MG/50ML IV SOLN
900.0000 mg | INTRAVENOUS | Status: AC
  Administered 2019-09-05: 900 mg via INTRAVENOUS
  Filled 2019-09-05: qty 50

## 2019-09-05 MED ORDER — CEFAZOLIN SODIUM 1 G IJ SOLR
INTRAMUSCULAR | Status: AC
  Filled 2019-09-05: qty 20

## 2019-09-05 MED ORDER — OXYCODONE HCL 5 MG PO TABS: 5.0000 mg | ORAL_TABLET | Freq: Once | ORAL | Status: DC | PRN

## 2019-09-05 MED ORDER — PROPOFOL 10 MG/ML IV BOLUS
INTRAVENOUS | Status: DC | PRN
  Administered 2019-09-05: 50 mg via INTRAVENOUS
  Administered 2019-09-05: 150 mg via INTRAVENOUS

## 2019-09-05 MED ORDER — MIDAZOLAM HCL 2 MG/2ML IJ SOLN: 1.0000 mg | Freq: Once | INTRAMUSCULAR | Status: AC

## 2019-09-05 MED ORDER — LIDOCAINE HCL (CARDIAC) PF 100 MG/5ML IV SOSY
PREFILLED_SYRINGE | INTRAVENOUS | Status: DC | PRN
  Administered 2019-09-05: 40 mg via INTRAVENOUS

## 2019-09-05 MED ORDER — HYDROCODONE-ACETAMINOPHEN 5-325 MG PO TABS
1.0000 | ORAL_TABLET | Freq: Four times a day (QID) | ORAL | 0 refills | Status: DC | PRN
Start: 2019-09-05 — End: 2019-10-03

## 2019-09-05 MED ORDER — OXYCODONE HCL 5 MG/5ML PO SOLN: 5.0000 mg | Freq: Once | ORAL | Status: DC | PRN

## 2019-09-05 MED ORDER — 0.9 % SODIUM CHLORIDE (POUR BTL) OPTIME
TOPICAL | Status: DC | PRN
  Administered 2019-09-05: 1000 mL

## 2019-09-05 MED ORDER — FENTANYL CITRATE (PF) 250 MCG/5ML IJ SOLN
INTRAMUSCULAR | Status: AC
  Filled 2019-09-05: qty 5

## 2019-09-05 MED ORDER — DEXAMETHASONE SODIUM PHOSPHATE 10 MG/ML IJ SOLN
INTRAMUSCULAR | Status: DC | PRN
  Administered 2019-09-05: 4 mg via INTRAVENOUS

## 2019-09-05 MED ORDER — PHENYLEPHRINE HCL-NACL 10-0.9 MG/250ML-% IV SOLN
INTRAVENOUS | Status: DC | PRN
Start: 2019-09-05 — End: 2019-09-05
  Administered 2019-09-05: 30 ug/min via INTRAVENOUS

## 2019-09-05 MED ORDER — FENTANYL CITRATE (PF) 100 MCG/2ML IJ SOLN: 25.0000 ug | INTRAMUSCULAR | Status: DC | PRN

## 2019-09-05 MED ORDER — PHENYLEPHRINE 40 MCG/ML (10ML) SYRINGE FOR IV PUSH (FOR BLOOD PRESSURE SUPPORT)
PREFILLED_SYRINGE | INTRAVENOUS | Status: DC | PRN
  Administered 2019-09-05 (×5): 80 ug via INTRAVENOUS

## 2019-09-05 MED ORDER — LIDOCAINE 2% (20 MG/ML) 5 ML SYRINGE
INTRAMUSCULAR | Status: AC
  Filled 2019-09-05: qty 5

## 2019-09-05 MED ORDER — FENTANYL CITRATE (PF) 100 MCG/2ML IJ SOLN: 50.0000 ug | Freq: Once | INTRAMUSCULAR | Status: AC

## 2019-09-05 MED ORDER — FENTANYL CITRATE (PF) 100 MCG/2ML IJ SOLN
INTRAMUSCULAR | Status: AC
  Administered 2019-09-05: 50 ug via INTRAVENOUS
  Filled 2019-09-05: qty 2

## 2019-09-05 MED ORDER — DEXAMETHASONE SODIUM PHOSPHATE 10 MG/ML IJ SOLN
INTRAMUSCULAR | Status: AC
  Filled 2019-09-05: qty 1

## 2019-09-05 MED ORDER — TECHNETIUM TC 99M SULFUR COLLOID FILTERED
1.0000 | Freq: Once | INTRAVENOUS | Status: AC | PRN
  Administered 2019-09-05: 1 via INTRADERMAL

## 2019-09-05 MED ORDER — BUPIVACAINE HCL 0.25 % IJ SOLN
INTRAMUSCULAR | Status: DC | PRN
  Administered 2019-09-05: 30 mL

## 2019-09-05 SURGICAL SUPPLY — 39 items
APPLIER CLIP 9.375 MED OPEN (MISCELLANEOUS) ×2
BINDER BREAST LRG (GAUZE/BANDAGES/DRESSINGS) IMPLANT
BINDER BREAST XLRG (GAUZE/BANDAGES/DRESSINGS) IMPLANT
CANISTER SUCT 3000ML PPV (MISCELLANEOUS) ×2 IMPLANT
CHLORAPREP W/TINT 26 (MISCELLANEOUS) ×4 IMPLANT
CLIP APPLIE 9.375 MED OPEN (MISCELLANEOUS) ×1 IMPLANT
CNTNR URN SCR LID CUP LEK RST (MISCELLANEOUS) ×1 IMPLANT
CONT SPEC 4OZ STRL OR WHT (MISCELLANEOUS) ×2
COVER PROBE W GEL 5X96 (DRAPES) ×2 IMPLANT
COVER SURGICAL LIGHT HANDLE (MISCELLANEOUS) ×2 IMPLANT
COVER WAND RF STERILE (DRAPES) IMPLANT
DERMABOND ADVANCED (GAUZE/BANDAGES/DRESSINGS) ×1
DERMABOND ADVANCED .7 DNX12 (GAUZE/BANDAGES/DRESSINGS) ×1 IMPLANT
DEVICE DUBIN SPECIMEN MAMMOGRA (MISCELLANEOUS) ×6 IMPLANT
DRAPE CHEST BREAST 15X10 FENES (DRAPES) ×2 IMPLANT
ELECT CAUTERY BLADE 6.4 (BLADE) ×2 IMPLANT
ELECT REM PT RETURN 9FT ADLT (ELECTROSURGICAL) ×2
ELECTRODE REM PT RTRN 9FT ADLT (ELECTROSURGICAL) ×1 IMPLANT
GLOVE BIO SURGEON STRL SZ8 (GLOVE) ×2 IMPLANT
GLOVE BIOGEL PI IND STRL 8 (GLOVE) ×1 IMPLANT
GLOVE BIOGEL PI INDICATOR 8 (GLOVE) ×1
GOWN STRL REUS W/ TWL LRG LVL3 (GOWN DISPOSABLE) ×1 IMPLANT
GOWN STRL REUS W/ TWL XL LVL3 (GOWN DISPOSABLE) ×1 IMPLANT
GOWN STRL REUS W/TWL LRG LVL3 (GOWN DISPOSABLE) ×2
GOWN STRL REUS W/TWL XL LVL3 (GOWN DISPOSABLE) ×2
KIT BASIN OR (CUSTOM PROCEDURE TRAY) ×2 IMPLANT
KIT MARKER MARGIN INK (KITS) ×2 IMPLANT
LIGHT WAVEGUIDE WIDE FLAT (MISCELLANEOUS) IMPLANT
NEEDLE 18GX1X1/2 (RX/OR ONLY) (NEEDLE) IMPLANT
NEEDLE FILTER BLUNT 18X 1/2SAF (NEEDLE)
NEEDLE FILTER BLUNT 18X1 1/2 (NEEDLE) IMPLANT
NEEDLE HYPO 25GX1X1/2 BEV (NEEDLE) ×2 IMPLANT
NS IRRIG 1000ML POUR BTL (IV SOLUTION) ×2 IMPLANT
PACK GENERAL/GYN (CUSTOM PROCEDURE TRAY) ×2 IMPLANT
SUT MNCRL AB 4-0 PS2 18 (SUTURE) ×2 IMPLANT
SUT VIC AB 3-0 SH 18 (SUTURE) ×2 IMPLANT
SYR CONTROL 10ML LL (SYRINGE) ×2 IMPLANT
TOWEL GREEN STERILE (TOWEL DISPOSABLE) ×2 IMPLANT
TOWEL GREEN STERILE FF (TOWEL DISPOSABLE) ×2 IMPLANT

## 2019-09-05 NOTE — Anesthesia Procedure Notes (Signed)
Anesthesia Regional Block: Pectoralis block   Pre-Anesthetic Checklist: ,, timeout performed, Correct Patient, Correct Site, Correct Laterality, Correct Procedure, Correct Position, site marked, Risks and benefits discussed,  Surgical consent,  Pre-op evaluation,  At surgeon's request and post-op pain management  Laterality: Left  Prep: chloraprep       Needles:  Injection technique: Single-shot  Needle Type: Echogenic Needle     Needle Length: 9cm  Needle Gauge: 21     Additional Needles:   Narrative:  Start time: 09/05/2019 3:20 PM End time: 09/05/2019 3:25 PM Injection made incrementally with aspirations every 5 mL.  Performed by: Personally  Anesthesiologist: Roberts Gaudy, MD  Additional Notes: 30 cc 0.25% Bupivacaine with 1:200 epi injected easily

## 2019-09-05 NOTE — Progress Notes (Signed)
Waunita Schooner from [facility] at bedside for injections.

## 2019-09-05 NOTE — Transfer of Care (Signed)
Immediate Anesthesia Transfer of Care Note  Patient: [name]  Procedure(s) Performed: RIGHT BREAST SEED LUMPECTOMY X 1, LEFT BREAST SEED LUMPECTOMY X 2, LEFT AXILLARY SENTINEL LYMPH NODE BIOPSY (N/A Breast)  Patient Location: PACU  Anesthesia Type:GA combined with regional for post-op pain  Level of Consciousness: drowsy  Airway & Oxygen Therapy: Patient Spontanous Breathing and Patient connected to face mask oxygen  Post-op Assessment: Report given to RN and Post -op Vital signs reviewed and stable  Post vital signs: Reviewed and stable  Last Vitals:  Vitals Value Taken Time  BP 145/68 09/05/19 1740  Temp    Pulse 74 09/05/19 1741  Resp 11 09/05/19 1741  SpO2 100 % 09/05/19 1741  Vitals shown include unvalidated device data.  Last Pain:  Vitals:   09/05/19 1525  TempSrc:   PainSc: 0-No pain      Patients Stated Pain Goal: 4 ([id number])  Complications: No apparent anesthesia complications

## 2019-09-05 NOTE — Anesthesia Preprocedure Evaluation (Signed)
Anesthesia Evaluation  Patient identified by MRN, date of birth, ID band Patient awake    Reviewed: Allergy & Precautions, NPO status , Patient's Chart, lab work & pertinent test results  Airway Mallampati: II  TM Distance: >3 FB Neck ROM: Full    Dental  (+) Teeth Intact, Dental Advisory Given   Pulmonary former smoker,    breath sounds clear to auscultation       Cardiovascular hypertension,  Rhythm:Regular Rate:Normal     Neuro/Psych    GI/Hepatic   Endo/Other    Renal/GU      Musculoskeletal   Abdominal   Peds  Hematology   Anesthesia Other Findings   Reproductive/Obstetrics                             Anesthesia Physical Anesthesia Plan  ASA: II  Anesthesia Plan: General   Post-op Pain Management:  Regional for Post-op pain   Induction: Intravenous  PONV Risk Score and Plan: Ondansetron and Dexamethasone  Airway Management Planned: LMA  Additional Equipment:   Intra-op Plan:   Post-operative Plan:   Informed Consent: I have reviewed the patients History and Physical, chart, labs and discussed the procedure including the risks, benefits and alternatives for the proposed anesthesia with the patient or authorized representative who has indicated his/her understanding and acceptance.     Dental advisory given  Plan Discussed with: CRNA and Anesthesiologist  Anesthesia Plan Comments:         Anesthesia Quick Evaluation

## 2019-09-05 NOTE — Discharge Instructions (Signed)
[facility] °Office Phone Number [phone] ° °BREAST BIOPSY/ PARTIAL MASTECTOMY: POST OP INSTRUCTIONS ° °Always review your discharge instruction sheet given to you by the facility where your surgery was performed. ° °IF YOU HAVE DISABILITY OR FAMILY LEAVE FORMS, YOU MUST BRING THEM TO THE OFFICE FOR PROCESSING.  DO NOT GIVE THEM TO YOUR DOCTOR. ° °1. A prescription for pain medication may be given to you upon discharge.  Take your pain medication as prescribed, if needed.  If narcotic pain medicine is not needed, then you may take acetaminophen (Tylenol) or ibuprofen (Advil) as needed. °2. Take your usually prescribed medications unless otherwise directed °3. If you need a refill on your pain medication, please contact your pharmacy.  They will contact our office to request authorization.  Prescriptions will not be filled after 5pm or on week-ends. °4. You should eat very light the first 24 hours after surgery, such as soup, crackers, pudding, etc.  Resume your normal diet the day after surgery. °5. Most patients will experience some swelling and bruising in the breast.  Ice packs and a good support bra will help.  Swelling and bruising can take several days to resolve.  °6. It is common to experience some constipation if taking pain medication after surgery.  Increasing fluid intake and taking a stool softener will usually help or prevent this problem from occurring.  A mild laxative (Milk of Magnesia or Miralax) should be taken according to package directions if there are no bowel movements after 48 hours. °7. Unless discharge instructions indicate otherwise, you may remove your bandages 24-48 hours after surgery, and you may shower at that time.  You may have steri-strips (small skin tapes) in place directly over the incision.  These strips should be left on the skin for 7-10 days.  If your surgeon used skin glue on the incision, you may shower in 24 hours.  The glue will flake off over the  next 2-3 weeks.  Any sutures or staples will be removed at the office during your follow-up visit. °8. ACTIVITIES:  You may resume regular daily activities (gradually increasing) beginning the next day.  Wearing a good support bra or sports bra minimizes pain and swelling.  You may have sexual intercourse when it is comfortable. °a. You may drive when you no longer are taking prescription pain medication, you can comfortably wear a seatbelt, and you can safely maneuver your car and apply brakes. °b. RETURN TO WORK:  ______________________________________________________________________________________ °9. You should see your doctor in the office for a follow-up appointment approximately two weeks after your surgery.  Your doctor’s nurse will typically make your follow-up appointment when she calls you with your pathology report.  Expect your pathology report 2-3 business days after your surgery.  You may call to check if you do not hear from us after three days. °10. OTHER INSTRUCTIONS: _______________________________________________________________________________________________ _____________________________________________________________________________________________________________________________________ °_____________________________________________________________________________________________________________________________________ °_____________________________________________________________________________________________________________________________________ ° °WHEN TO CALL YOUR DOCTOR: °1. Fever over 101.0 °2. Nausea and/or vomiting. °3. Extreme swelling or bruising. °4. Continued bleeding from incision. °5. Increased pain, redness, or drainage from the incision. ° °The clinic staff is available to answer your questions during regular business hours.  Please don’t hesitate to call and ask to speak to one of the nurses for clinical concerns.  If you have a medical emergency, go to the nearest  emergency room or call 911.  A surgeon from [facility] is always on call at the hospital. ° °For further questions, please visit [email or web address]  °

## 2019-09-05 NOTE — Anesthesia Procedure Notes (Signed)
Procedure Name: LMA Insertion Date/Time: 09/05/2019 3:45 PM Performed by: Janene Harvey, CRNA Pre-anesthesia Checklist: Patient identified, Emergency Drugs available, Suction available and Patient being monitored Patient Re-evaluated:Patient Re-evaluated prior to induction Oxygen Delivery Method: Circle system utilized Preoxygenation: Pre-oxygenation with 100% oxygen Induction Type: IV induction Ventilation: Mask ventilation without difficulty LMA: LMA inserted LMA Size: 3.0 Number of attempts: 1 Placement Confirmation: positive ETCO2 and breath sounds checked- equal and bilateral Dental Injury: Teeth and Oropharynx as per pre-operative assessment

## 2019-09-05 NOTE — Interval H&P Note (Signed)
History and Physical Interval Note:  09/05/2019 3:13 PM  Robin Anderson  has presented today for surgery, with the diagnosis of left breast cancer, RIGHT BREAST DCIS.  The various methods of treatment have been discussed with the patient and family. After consideration of risks, benefits and other options for treatment, the patient has consented to  Procedure(s): RIGHT BREAST SEED LUMPECTOMY X 1, LEFT BREAST SEED LUMPECTOMY X 2, LEFT AXILLARY SENTINEL LYMPH NODE BIOPSY (N/A) as a surgical intervention.  The patient's history has been reviewed, patient examined, no change in status, stable for surgery.  I have reviewed the patient's chart and labs.  Questions were answered to the patient's satisfaction.     [place]

## 2019-09-05 NOTE — Anesthesia Procedure Notes (Signed)
Anesthesia Regional Block: Pectoralis block   Pre-Anesthetic Checklist: ,, timeout performed, Correct Patient, Correct Site, Correct Laterality, Correct Procedure, Correct Position, site marked, Risks and benefits discussed,  Surgical consent,  Pre-op evaluation,  At surgeon's request and post-op pain management  Laterality: Right  Prep: chloraprep       Needles:  Injection technique: Single-shot  Needle Type: Echogenic Needle     Needle Length: 9cm  Needle Gauge: 21     Additional Needles:   Narrative:  Start time: 09/05/2019 3:15 PM End time: 09/05/2019 3:20 PM Injection made incrementally with aspirations every 5 mL.  Performed by: Personally  Anesthesiologist: Roberts Gaudy, MD  Additional Notes: 30 cc 0.25% Bupivacaine with 1:200 epi injected easily

## 2019-09-05 NOTE — Op Note (Signed)
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  She had minimal response but had some reduction in size.  She desired breast conserving surgery.  She presents today for left breast lumpectomy x2 with left axillary sentinel lymph node mapping and right breast lumpectomy.The procedure has been discussed with the patient. Alternatives to surgery have been discussed with the patient.  Risks of surgery include bleeding,  Infection,  Seroma formation, death,  and the need for further surgery.   The patient understands and wishes to proceed.Sentinel lymph node mapping and dissection has been discussed with the patient.  Risk of bleeding,  Infection,  Seroma formation,  Additional procedures,,  Shoulder weakness ,  Shoulder stiffness,  Nerve and blood vessel injury arm swelling  and reaction to the mapping dyes have been discussed.  Alternatives to surgery have been discussed with the patient.  The patient agrees to proceed.  Description of procedure: The patient was met in the holding area.  All seeds were identified using the neoprobe in both breast.  Both breasts were marked as correct.  She underwent technetium injection left  breast and block on the left.  All questions answered.  She was placed supine upon the OR table.  If induction of general esthesia both breasts were prepped and draped sterile fashion timeout performed.  Left side was done first.  Neoprobe used to identify both seeds.  1 was more superior and superficial and one was more deep and inferior.  The right breast seed was just below the nipple were complex.  On the left an incision was made along the superior border the nipple areolar complex.  The first seed was excised.  Margins were grossly negative.  Additional margin was taken as well.  Hemostasis achieved.  Through an inferior mammary crease incision, the second session was made.  All tissue around the second seed and clip were excised.  The superior margin was close therefore additional superior margin taken and sent separately.  Faxitron revealed the seed and clip to be in specimen.  Of note the for specimen the seed was separate from the specimen.  Both cavities were clipped.  Both made hemostatic with cautery and closed with 3-0 Vicryl for Monocryl.  Neoprobe settings were switched technetium.  Hotspot identified in left axilla.  A 4 cm left axillary incision was made.  Dissection was carried down to the level 1 lymph node basin.  There were 2 hot sentinel nodes removed.  Background counts approached 0.  Hemostasis was achieved.  Wound closed with 3-0 Vicryl and 4-0 Monocryl.  The right breast was addressed in similar fashion.  Neoprobe used to identify the seed into the right nipple.  Curvilinear incision made along the inferior border of the nipple areolar complex.  Dissection carried down all  tissue and the seed and clip were excised.  Faxitron revealed only the seed initial specimen.  This had migrated away from the clip.  I took additional medial margin which had the clip in it in the Faxitron verified this.  These were oriented with ink and passed off the field.  Clips were used in the cavity.  Incision  closed with 3-0 Vicryl and 4 Monocryl.  Dermabond applied.  All counts were found to be correct of sponge, needles and instruments.  Breast binder placed.  Patient was awoke extubated taken recovery in satisfactory condition.

## 2019-09-06 ENCOUNTER — Encounter: Payer: Self-pay | Admitting: *Deleted

## 2019-09-06 NOTE — Anesthesia Postprocedure Evaluation (Signed)
Anesthesia Post Note  Patient: Robin Anderson  Procedure(s) Performed: RIGHT BREAST SEED LUMPECTOMY X 1, LEFT BREAST SEED LUMPECTOMY X 2, LEFT AXILLARY SENTINEL LYMPH NODE BIOPSY (N/A Breast)     Patient location during evaluation: PACU Anesthesia Type: General Level of consciousness: awake and alert Pain management: pain level controlled Vital Signs Assessment: post-procedure vital signs reviewed and stable Respiratory status: spontaneous breathing, nonlabored ventilation and respiratory function stable Cardiovascular status: blood pressure returned to baseline and stable Postop Assessment: no apparent nausea or vomiting Anesthetic complications: no    Last Vitals:  Vitals:   09/05/19 1755 09/05/19 1810  BP: (!) 141/73 (!) 169/80  Pulse: 71 72  Resp: 12 13  Temp:  (!) 36.3 C  SpO2: 100% 99%    Last Pain:  Vitals:   09/05/19 1810  TempSrc:   PainSc: 0-No pain                 Audry Pili

## 2019-09-09 LAB — GLUCOSE, CAPILLARY: Glucose-Capillary: 106 mg/dL — ABNORMAL HIGH (ref 70–99)

## 2019-09-11 LAB — SURGICAL PATHOLOGY

## 2019-09-12 ENCOUNTER — Encounter: Payer: Self-pay | Admitting: *Deleted

## 2019-09-12 NOTE — Progress Notes (Signed)
 Patient Care Team: Daye, Deneda T, FNP as PCP - General (Family Medicine) Stuart, Dawn C, RN as Oncology Nurse Navigator Martini, Keisha N, RN as Oncology Nurse Navigator  DIAGNOSIS:    ICD-10-CM   1. Malignant neoplasm of lower-outer quadrant of left breast of female, estrogen receptor positive (HCC)  C50.512    Z17.0     SUMMARY OF ONCOLOGIC HISTORY: Oncology History  Malignant neoplasm of lower-outer quadrant of left breast of female, estrogen receptor positive (HCC)  01/31/2019 Initial Diagnosis   Palpable lump in the left breast x4 months mammogram 01/27/2019: 2.5 cm mass at 6 o'clock position, 0.7 cm mass retroareolar, axilla negative: Grade 2 IDC ER 100%, PR 100%, HER-2 negative, Ki-67 15% T2N0 stage IB   02/17/2019 Cancer Staging   Staging form: Breast, AJCC 8th Edition - Clinical stage from 02/17/2019: Stage IB (cT2, cN0, cM0, G2, ER+, PR+, HER2-) - Signed by Gudena, Vinay, MD on 02/17/2019   02/17/2019 - 09/04/2019 Anti-estrogen oral therapy   Neoadjuvant anastrozole 1mg daily   09/05/2019 Surgery   Left lumpectomy ([name]): IDC, grade 2, 3.0cm, with intermediate grade DCIS, 2 left axillary lymph nodes negative.  Margins positive, right lumpectomy: Benign     CHIEF COMPLIANT: Follow-up s/p lumpectomy to review pathology   INTERVAL HISTORY: Robin Anderson is a 79 y.o. with above-mentioned history of left breast cancer who was on neoadjuvant antiestrogen therapy with anastrozole. She underwent a left lumpectomy on 09/05/19 with Dr. [name] for which pathology showed invasive ductal carcinoma, grade 2, 3.0cm, with intermediate grade DCIS, 2 left axillary lymph nodes negative for carcinoma. She presents to the clinic today to review the pathology report and further treatment.   ALLERGIES:  is allergic to penicillins.  MEDICATIONS:  Current Outpatient Medications  Medication Sig Dispense Refill  . anastrozole (ARIMIDEX) 1 MG tablet Take 1 tablet (1 mg total) by mouth  daily. (Patient taking differently: Take 1 mg by mouth daily at 12 noon. ) 90 tablet 3  . benazepril-hydrochlorthiazide (LOTENSIN HCT) 20-25 MG tablet Take 1 tablet by mouth daily. 90 tablet 3  . calcium carbonate (CALCIUM 600) 600 MG TABS tablet Take 600 mg by mouth daily.    . Cholecalciferol (VITAMIN D3) 50 MCG (2000 UT) TABS Take 2,000 Units by mouth daily.    . HYDROcodone-acetaminophen (NORCO/VICODIN) 5-325 MG tablet Take 1 tablet by mouth every 6 (six) hours as needed for moderate pain. 15 tablet 0  . primidone (MYSOLINE) 50 MG tablet TAKE 3 TABLETS BY MOUTH AT BEDTIME (Patient taking differently: Take 150 mg by mouth at bedtime. ) 270 tablet 3  . simvastatin (ZOCOR) 20 MG tablet TAKE 1 TABLET BY MOUTH EVERYDAY AT BEDTIME (Patient taking differently: Take 20 mg by mouth at bedtime. TAKE 1 TABLET BY MOUTH EVERYDAY AT BEDTIME) 90 tablet 2   No current facility-administered medications for this visit.    PHYSICAL EXAMINATION: ECOG PERFORMANCE STATUS: 1 - Symptomatic but completely ambulatory  Vitals:   09/13/19 1049  BP: (!) 169/58  Pulse: 63  Resp: 18  Temp: 98.3 F (36.8 C)  SpO2: 100%   Filed Weights   09/13/19 1049  Weight: 141 lb 8 oz (64.2 kg)    LABORATORY DATA:  I have reviewed the data as listed CMP Latest Ref Rng & Units 09/01/2019 04/11/2019 01/11/2019  Glucose 70 - 99 mg/dL 94 94 88  BUN 8 - 23 mg/dL 13 14 15  Creatinine 0.44 - 1.00 mg/dL 1.06(H) 1.05(H) 1.13(H)  Sodium 135 - 145   mmol/L 138 141 141  Potassium 3.5 - 5.1 mmol/L 3.6 3.6 3.6  Chloride 98 - 111 mmol/L 100 101 102  CO2 22 - 32 mmol/L _0 Calcium 8.9 - 10.3 mg/dL 9.6 9.6 9.2  Total Protein 6.5 - 8.1 g/dL 7.2 7.1 7.1  Total Bilirubin 0.3 - 1.2 mg/dL 0.7 0.3 0.2  Alkaline Phos 38 - 126 U/L 83 97 102  AST 15 - 41 U/L _1 ALT 0 - 44 U/L _2 Lab Results  Component Value Date   WBC 7.1 09/01/2019   HGB 12.0 09/01/2019   HCT 38.7 09/01/2019   MCV 89.4 09/01/2019   PLT 214  09/01/2019   NEUTROABS 5.4 09/01/2019    ASSESSMENT & PLAN:  Malignant neoplasm of lower-outer quadrant of left breast of female, estrogen receptor positive ([place]) 01/31/2019:Palpable lump in the left breast x4 months mammogram 01/27/2019: 2.5 cm mass at 6 o'clock position, 0.7 cm mass retroareolar, axilla negative: Grade 2 IDC ER 100%, PR 100%, HER-2 negative, Ki-67 15%  T2N0 stage IB 03/10/2019: Biopsy of the right breast: DCIS, 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Neoadjuvant antiestrogen therapy anastrozole 1 mg daily 02/17/2019 2.Breast conserving surgery 09/05/2019 3. Adjuvant radiation therapy followed by (patient is also not keen on adjuvant radiation) 4.Continuation of adjuvant antiestrogen therapy --------------------------------------------------------------------------------------------------------------------------- Current treatment: Neoadjuvant anastrozole 1 mg daily started 02/17/2019 Anastrozole toxicities:Tolerating it well   09/05/2019:Left lumpectomy ([name]): IDC, grade 2, 3.0cm, with intermediate grade DCIS, 2 left axillary lymph nodes negative.  Invasive carcinoma present posterior margin focally, inferior margin focally, anterior superior margin broadly and anterior margin broadly. Right lumpectomy: Benign  Pathology counseling: I discussed with the patient of the margins being positive would require additional surgery. Following clearance of the margins she will need adjuvant radiation and continuation of antiestrogen therapy. I discussed with Dr. Brantley Stage who agrees that she will need margins excised. Return to clinic after radiation is complete.   No orders of the defined types were placed in this encounter.  The patient has a good understanding of the overall plan. she agrees with it. she will call with any problems that may develop before the next visit here.  Total time spent: 30 mins including face to face time and time spent for planning, charting and  coordination of care  Nicholas Lose, MD 09/13/2019  I, Cloyde Reams Dorshimer, am acting as scribe for Dr. Nicholas Lose.  I have reviewed the above documentation for accuracy and completeness, and I agree with the above.

## 2019-09-13 ENCOUNTER — Inpatient Hospital Stay: Payer: Medicare Other | Attending: Hematology and Oncology | Admitting: Hematology and Oncology

## 2019-09-13 ENCOUNTER — Ambulatory Visit: Payer: Self-pay | Admitting: Surgery

## 2019-09-13 ENCOUNTER — Other Ambulatory Visit: Payer: Self-pay

## 2019-09-13 DIAGNOSIS — Z79811 Long term (current) use of aromatase inhibitors: Secondary | ICD-10-CM | POA: Diagnosis not present

## 2019-09-13 DIAGNOSIS — C50512 Malignant neoplasm of lower-outer quadrant of left female breast: Secondary | ICD-10-CM | POA: Diagnosis not present

## 2019-09-13 DIAGNOSIS — Z170 Estrogen receptor positive status [ER+]: Secondary | ICD-10-CM | POA: Diagnosis not present

## 2019-09-13 NOTE — Assessment & Plan Note (Signed)
01/31/2019:Palpable lump in the left breast x4 months mammogram 01/27/2019: 2.5 cm mass at 6 o'clock position, 0.7 cm mass retroareolar, axilla negative: Grade 2 IDC ER 100%, PR 100%, HER-2 negative, Ki-67 15%  T2N0 stage IB 03/10/2019: Biopsy of the right breast: DCIS, 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Neoadjuvant antiestrogen therapy anastrozole 1 mg daily 02/17/2019 2.Breast conserving surgery 09/05/2019 3. Adjuvant radiation therapy followed by (patient is also not keen on adjuvant radiation) 4.Continuation of adjuvant antiestrogen therapy --------------------------------------------------------------------------------------------------------------------------- Current treatment: Neoadjuvant anastrozole 1 mg daily started 02/17/2019 Anastrozole toxicities:Tolerating it well   09/05/2019:Left lumpectomy ([name]): IDC, grade 2, 3.0cm, with intermediate grade DCIS, 2 left axillary lymph nodes negative.  Invasive carcinoma present posterior margin focally, inferior margin focally, anterior superior margin broadly and anterior margin broadly. Right lumpectomy: Benign  Pathology counseling: I discussed with the patient of the margins being positive would require additional surgery. Following clearance of the margins she will need adjuvant radiation and continuation of antiestrogen therapy.

## 2019-09-19 ENCOUNTER — Other Ambulatory Visit (HOSPITAL_COMMUNITY): Payer: Medicare Other

## 2019-09-20 ENCOUNTER — Ambulatory Visit: Admission: RE | Admit: 2019-09-20 | Payer: Medicare Other | Source: Ambulatory Visit | Admitting: Radiation Oncology

## 2019-09-20 ENCOUNTER — Encounter: Payer: Self-pay | Admitting: Adult Health

## 2019-09-20 ENCOUNTER — Ambulatory Visit: Payer: Medicare Other

## 2019-09-20 DIAGNOSIS — Z170 Estrogen receptor positive status [ER+]: Secondary | ICD-10-CM | POA: Insufficient documentation

## 2019-09-20 DIAGNOSIS — C50111 Malignant neoplasm of central portion of right female breast: Secondary | ICD-10-CM | POA: Insufficient documentation

## 2019-09-21 ENCOUNTER — Encounter: Payer: Self-pay | Admitting: *Deleted

## 2019-09-22 ENCOUNTER — Ambulatory Visit: Payer: Self-pay | Admitting: Surgery

## 2019-09-22 NOTE — H&P (View-Only) (Signed)
Robin Anderson Appointment: 09/22/2019 9:10 AM Location: [facility] Patient #: [id number] DOB: 02/02/1941 Undefined / Language: Robin Anderson / Race: Black or African American Female  History of Present Illness Robin Anderson A. Robin Fleener MD; 09/22/2019 9:55 AM) Patient words: Patient returns after bilateral lumpectomies for breast cancer. Left lumpectomy specimen multiple had multiple positive margins and she is scheduled for excision. The right side is clear.     FINAL MICROSCOPIC DIAGNOSIS:  A. BREAST, LEFT SUPERFICIAL, LUMPECTOMY: - Benign breast tissue.  B. RADIOACTIVE SEED, LEFT BREAST, REMOVAL: - Gross diagnosis only: Radioactive seed.  C. BREAST, LEFT INFERIOR, LUMPECTOMY: - Invasive ductal carcinoma, grade 2, spanning 3 cm. 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 - Invasive carcinoma is present at the posterior margin focally (on ink), inferior margin focally (on ink), anterior-superior margin broadly (on ink), and anterior margin broadly (on ink). - In situ carcinoma is <0.1 cm of the anterior-superior, anterior, and lateral margins focally (not on ink). - Biopsy site. - See oncology table.  D. BREAST, LEFT ADDITIONAL SUPERIOR MARGIN, EXCISION: - Benign breast tissue.  E. BREAST, RIGHT, LUMPECTOMY: - Benign breast tissue with dilated ducts. - No residual carcinoma. - See oncology table and comment.  F. LYMPH NODE, LEFT AXILLARY, SENTINEL, BIOPSY: - One of one lymph nodes negative for carcinoma (0/1).  G. LYMPH NODE, LEFT AXILLARY, SENTINEL, BIOPSY: - One of one lymph nodes negative for carcinoma (0/1).  H. BREAST, RIGHT ADDITIONAL MEDIAL MARGIN, EXCISION: - Benign breast tissue with hemorrhage and fibrosis. - Biopsy site.  ONCOLOGY TABLE:  C. INVASIVE CARCINOMA OF THE BREAST: STATUS POST NEOADJUVANT TREATMENT: Resection  Procedure: Left breast lumpectomy with axillary sentinel lymph node biopsies Specimen Laterality: Left Tumor Size:  3 cm Histologic Type: Invasive ductal carcinoma Histologic Grade: Glandular (Acinar)/Tubular Differentiation: 3 Nuclear Pleomorphism: 2 Mitotic Rate: 1 Overall Grade: 2 Ductal Carcinoma In Situ: Present, intermediate grade with necrosis Tumor Extension: Limited to breast parenchyma Margins: Distance from closest margin (millimeters): Present at the posterior margin focally (on ink), inferior margin focally (on ink), anterior-superior margin broadly (on ink), and anterior margin broadly (on ink). Specify closest margin (required only if <58m): See above. DCIS Margins (required only if DCIS is present in specimen): Distance from closest margin (millimeters): <0.1 cm of the anterior-superior, anterior, and lateral margins focally (not on ink). Specify closest margin (required only if <160m: See above. Regional Lymph Nodes: Number of Lymph Nodes Examined: 2 Number of Sentinel Nodes Examined (if applicable): 2 Number of Lymph Nodes with Macrometastases (>2 mm): 0 Number of Lymph Nodes with Micrometastases: 0 Number of Lymph Nodes with Isolated Tumor Cells (=0.2 mm or =200 cells): 0 Size of Largest Metastatic Deposit (millimeters): Not applicable Extranodal Extension: Not applicable Treatment Effect: No definitive response to presurgical therapy (anti-estrogens) Breast Prognostic Profile (pre-neoadjuvant case #: : [id number] Receptor: Positive, 100%, strong staining Progesterone Receptor: Positive, 100%, strong staining Her2: Equivocal IHC reflex negative FISH Ki-67: 15% Will be repeated on the current case (Block #: C1) and the results reported separately. Residual Cancer Burden (RCB): Primary Tumor Bed: 30 mm x 26 mm Overall Cancer Cellularity: 40% Percentage of Cancer that is in Situ: 10% Number of Positive Lymph Nodes: 0 Diameter of Largest Lymph Node metastasis: Not  applicable Residual Cancer Burden: 2.073 Residual Cancer Burden Class: RCB-II Representative Tumor Block: C1 Pathologic Stage Classification (pTNM, AJCC 8th Edition): ypT2, ypN0  (v4.4.0.0)  D. INVASIVE CARCINOMA OF THE BREAST: Resection  Where applicable based on biopsy [id number]  Procedure: Right  breast lumpectomy Specimen Laterality: Right Tumor Size: 0.1 cm Histologic Type: Invasive ductal carcinoma Histologic Grade: Glandular (Acinar)/Tubular Differentiation: 2 Nuclear Pleomorphism: 1 Mitotic Rate: 1 Overall Grade: 1 Ductal Carcinoma In Situ: Present in biopsy, low grade. Tumor Extension: Limited to breast parenchyma Margins: Not applicable DCIS Margins (required only if DCIS is present in specimen): Not applicable Regional Lymph Nodes: No regional lymph nodes examined Breast Prognostic Profile (pre-neoadjuvant case #: [id number]) DCIS component only, see comment. Estrogen Receptor: Positive, 100% strong staining. Progesterone Receptor: Positive, 40%, moderate staining Representative Tumor Block: Biopsy [id number] Pathologic Stage Classification (pTNM, AJCC 8th Edition): pT63m, pNX Comment: The biopsy was reviewed and found to have a small focus of invasive tumor which is present only on the initial 2 levels. The tumor cut away on deeper levels and prognostic IHC. Robin Anderson Stagewas paged by Dr. CJeannie Doneconcerning the biopsy on 09/11/2019. The focus is small and felt to have been completely excised in the biopsy. There is no residual tumor in the excision and thus the tumor is graded as pre-treatment and prognostic markers cannot be performed.  The patient is a 79year old female.   Allergies (Robin Anderson CMA; 09/22/2019 8:40 AM) Penicillins Hives. Allergies Reconciled  Medication History (Robin Anderson CMA; 09/22/2019 8:41 AM) Anastrozole ('1MG'$  Tablet, Oral) Active. Benazepril-hydroCHLOROthiazide (20-'25MG'$  Tablet, Oral)  Active. HYDROcodone-Acetaminophen (5-'325MG'$  Tablet, Oral) Active. Primidone ('50MG'$  Tablet, Oral) Active. Simvastatin ('20MG'$  Tablet, Oral) Active. Medications Reconciled    Vitals (Robin Anderson CMA; 09/22/2019 8:42 AM) 09/22/2019 8:41 AM Weight: 142.13 lb Height: 64in Body Surface Area: 1.69 m Body Mass Index: 24.4 kg/m  Temp.: 98.6F  Pulse: 76 (Regular)  BP: 124/76(Sitting, Left Arm, Standard)        Physical Exam (Javares Kaufhold A. Heavenlee Maiorana MD; 09/22/2019 9:55 AM)  Breast Note: Right breast incision clean dry and intact. Right axial incision clean dry and intact. The 2 left breast incisions are clean dry and intact. Left axillary incision shows a small seroma was otherwise intact    Assessment & Plan (Tanekia Ryans A. Mylene Bow MD; 09/22/2019 9:20 AM)  POST-OPERATIVE STATE ([phone] Impression: plan re excision left breast Risk of lumpectomy include bleeding, infection, seroma, more surgery, use of seed/wire, wound care, cosmetic deformity and the need for other treatments, death , blood clots, death. Pt agrees to proceed.  Current Plans Pt Education - [facility] Free Text Education/Instructions: discussed with patient and provided information.

## 2019-09-22 NOTE — H&P (Signed)
Robin Anderson Appointment: 09/22/2019 9:10 AM Location: [facility] Patient #: [id number] DOB: 05/14/1940 Undefined / Language: Robin Anderson / Race: Black or African American Female  History of Present Illness Robin Moores [name] MD; 09/22/2019 9:55 AM) Patient words: Patient returns after bilateral lumpectomies for breast cancer. Left lumpectomy specimen multiple had multiple positive margins and she is scheduled for excision. The right side is clear.     FINAL MICROSCOPIC DIAGNOSIS:  A. BREAST, LEFT SUPERFICIAL, LUMPECTOMY: - Benign breast tissue.  B. RADIOACTIVE SEED, LEFT BREAST, REMOVAL: - Gross diagnosis only: Radioactive seed.  C. BREAST, LEFT INFERIOR, LUMPECTOMY: - Invasive ductal carcinoma, grade 2, spanning 3 cm. 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 - Invasive carcinoma is present at the posterior margin focally (on ink), inferior margin focally (on ink), anterior-superior margin broadly (on ink), and anterior margin broadly (on ink). - In situ carcinoma is <0.1 cm of the anterior-superior, anterior, and lateral margins focally (not on ink). - Biopsy site. - See oncology table.  D. BREAST, LEFT ADDITIONAL SUPERIOR MARGIN, EXCISION: - Benign breast tissue.  E. BREAST, RIGHT, LUMPECTOMY: - Benign breast tissue with dilated ducts. - No residual carcinoma. - See oncology table and comment.  F. LYMPH NODE, LEFT AXILLARY, SENTINEL, BIOPSY: - One of one lymph nodes negative for carcinoma (0/1).  G. LYMPH NODE, LEFT AXILLARY, SENTINEL, BIOPSY: - One of one lymph nodes negative for carcinoma (0/1).  H. BREAST, RIGHT ADDITIONAL MEDIAL MARGIN, EXCISION: - Benign breast tissue with hemorrhage and fibrosis. - Biopsy site.  ONCOLOGY TABLE:  C. INVASIVE CARCINOMA OF THE BREAST: STATUS POST NEOADJUVANT TREATMENT: Resection  Procedure: Left breast lumpectomy with axillary sentinel lymph node biopsies Specimen Laterality: Left Tumor Size:  3 cm Histologic Type: Invasive ductal carcinoma Histologic Grade: Glandular (Acinar)/Tubular Differentiation: 3 Nuclear Pleomorphism: 2 Mitotic Rate: 1 Overall Grade: 2 Ductal Carcinoma In Situ: Present, intermediate grade with necrosis Tumor Extension: Limited to breast parenchyma Margins: Distance from closest margin (millimeters): Present at the posterior margin focally (on ink), inferior margin focally (on ink), anterior-superior margin broadly (on ink), and anterior margin broadly (on ink). Specify closest margin (required only if <35m): See above. DCIS Margins (required only if DCIS is present in specimen): Distance from closest margin (millimeters): <0.1 cm of the anterior-superior, anterior, and lateral margins focally (not on ink). Specify closest margin (required only if <121m: See above. Regional Lymph Nodes: Number of Lymph Nodes Examined: 2 Number of Sentinel Nodes Examined (if applicable): 2 Number of Lymph Nodes with Macrometastases (>2 mm): 0 Number of Lymph Nodes with Micrometastases: 0 Number of Lymph Nodes with Isolated Tumor Cells (=0.2 mm or =200 cells): 0 Size of Largest Metastatic Deposit (millimeters): Not applicable Extranodal Extension: Not applicable Treatment Effect: No definitive response to presurgical therapy (anti-estrogens) Breast Prognostic Profile (pre-neoadjuvant case #: : [id number] Receptor: Positive, 100%, strong staining Progesterone Receptor: Positive, 100%, strong staining Her2: Equivocal IHC reflex negative FISH Ki-67: 15% Will be repeated on the current case (Block #: C1) and the results reported separately. Residual Cancer Burden (RCB): Primary Tumor Bed: 30 mm x 26 mm Overall Cancer Cellularity: 40% Percentage of Cancer that is in Situ: 10% Number of Positive Lymph Nodes: 0 Diameter of Largest Lymph Node metastasis: Not  applicable Residual Cancer Burden: 2.073 Residual Cancer Burden Class: RCB-II Representative Tumor Block: C1 Pathologic Stage Classification (pTNM, AJCC 8th Edition): ypT2, ypN0  (v4.4.0.0)  D. INVASIVE CARCINOMA OF THE BREAST: Resection  Where applicable based on biopsy [id number]  Procedure: Right  breast lumpectomy Specimen Laterality: Right Tumor Size: 0.1 cm Histologic Type: Invasive ductal carcinoma Histologic Grade: Glandular (Acinar)/Tubular Differentiation: 2 Nuclear Pleomorphism: 1 Mitotic Rate: 1 Overall Grade: 1 Ductal Carcinoma In Situ: Present in biopsy, low grade. Tumor Extension: Limited to breast parenchyma Margins: Not applicable DCIS Margins (required only if DCIS is present in specimen): Not applicable Regional Lymph Nodes: No regional lymph nodes examined Breast Prognostic Profile (pre-neoadjuvant case #: [id number]) DCIS component only, see comment. Estrogen Receptor: Positive, 100% strong staining. Progesterone Receptor: Positive, 40%, moderate staining Representative Tumor Block: Biopsy [id number] Pathologic Stage Classification (pTNM, AJCC 8th Edition): pT29m, pNX Comment: The biopsy was reviewed and found to have a small focus of invasive tumor which is present only on the initial 2 levels. The tumor cut away on deeper levels and prognostic IHC. Robin Anderson Stagewas paged by Dr. CJeannie Doneconcerning the biopsy on 09/11/2019. The focus is small and felt to have been completely excised in the biopsy. There is no residual tumor in the excision and thus the tumor is graded as pre-treatment and prognostic markers cannot be performed.  The patient is a 79year old female.   Allergies (Robin Anderson; 09/22/2019 8:40 AM) Penicillins Hives. Allergies Reconciled  Medication History (Robin Anderson; 09/22/2019 8:41 AM) Anastrozole ('1MG'$  Tablet, Oral) Active. Benazepril-hydroCHLOROthiazide (20-'25MG'$  Tablet, Oral)  Active. HYDROcodone-Acetaminophen (5-'325MG'$  Tablet, Oral) Active. Primidone ('50MG'$  Tablet, Oral) Active. Simvastatin ('20MG'$  Tablet, Oral) Active. Medications Reconciled    Vitals (Robin Anderson; 09/22/2019 8:42 AM) 09/22/2019 8:41 AM Weight: 142.13 lb Height: 64in Body Surface Area: 1.69 m Body Mass Index: 24.4 kg/m  Temp.: 98.91F  Pulse: 76 (Regular)  BP: 124/76(Sitting, Left Arm, Standard)        Physical Exam (Tove Wideman A. Bay Wayson MD; 09/22/2019 9:55 AM)  Breast Note: Right breast incision clean dry and intact. Right axial incision clean dry and intact. The 2 left breast incisions are clean dry and intact. Left axillary incision shows a small seroma was otherwise intact    Assessment & Plan (Shiloh Southern A. Kinley Ferrentino MD; 09/22/2019 9:20 AM)  POST-OPERATIVE STATE ([phone] Impression: plan re excision left breast Risk of lumpectomy include bleeding, infection, seroma, more surgery, use of seed/wire, wound care, cosmetic deformity and the need for other treatments, death , blood clots, death. Pt agrees to proceed.  Current Plans Pt Education - [employer] Free Text Education/Instructions: discussed with patient and provided information.

## 2019-09-25 NOTE — Progress Notes (Signed)
Location of Breast Cancer: Malignant neoplasm of lower-outer quadrant of LEFT breast, estrogen receptor positive  Histology per Pathology Report: 09/05/2019 FINAL MICROSCOPIC DIAGNOSIS:  A. BREAST, LEFT SUPERFICIAL, LUMPECTOMY:  - Benign breast tissue.  B. RADIOACTIVE SEED, LEFT BREAST, REMOVAL:  - Gross diagnosis only: Radioactive seed.  C. BREAST, LEFT INFERIOR, LUMPECTOMY:  - Invasive ductal carcinoma, grade 2, spanning 3 cm.  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  - Invasive carcinoma is present at the posterior margin focally (on  ink), inferior margin focally (on ink), anterior-superior margin broadly  (on ink), and anterior margin broadly (on ink).  - In situ carcinoma is <0.1 cm of the anterior-superior, anterior, and  lateral margins focally (not on ink).  - Biopsy site.  - See oncology table.  D. BREAST, LEFT ADDITIONAL SUPERIOR MARGIN, EXCISION:  - Benign breast tissue.  E. BREAST, RIGHT, LUMPECTOMY:  - Benign breast tissue with dilated ducts.  - No residual carcinoma.  - See oncology table and comment.  F. LYMPH NODE, LEFT AXILLARY, SENTINEL, BIOPSY:  - One of one lymph nodes negative for carcinoma (0/1).  G. LYMPH NODE, LEFT AXILLARY, SENTINEL, BIOPSY:  - One of one lymph nodes negative for carcinoma (0/1).  H. BREAST, RIGHT ADDITIONAL MEDIAL MARGIN, EXCISION:  - Benign breast tissue with hemorrhage and fibrosis.  - Biopsy site.   Receptor Status:  01/31/2019 LEFT: ER(100%), PR (100%), Her2-neu (negative), Ki-67(15%) 03/10/2019 RIGHT: ER(100%), PR (40%), Her2-neu   Did patient present with symptoms (if so, please note symptoms) or was this found on screening mammography?:  Patient palpated a lump in her left breast last yeat. Diagnostic mammogram and [place] on 01/27/2019 showed a 2.5cm mass at the 6 o'clock position, a 0.7cm mass in the retroareolar left breast, and no abnormal left axillary adenopathy  Past/Anticipated interventions by  surgeon, if any: 09/22/2019 Left lumpectomy specimen multiple had multiple positive margins and she is scheduled for excision (10/03/2019). The right side is clear. 09/05/2019 Dr. [name] [name] Left breast seed lumpectomy x2 with left axillary sentinel lymph node mapping and right breast seed lumpectomy  Past/Anticipated interventions by medical oncology, if any:  Under care of Dr. Nicholas Lose Recommendations: 1.Neoadjuvant antiestrogen therapy anastrozole 1 mg daily 02/17/2019 2.Breast conserving surgery 09/05/2019 3. Adjuvant radiation therapy followed by(patient is also not keen on adjuvant radiation) 4.Continuation of adjuvant antiestrogen therapy  Lymphedema issues, if any: Patient states that her left arm/hand are always slightly more swollen, and that the left breast feels significantly more swollen and heavy than the right.   Pain issues, if any:  Some mild discomfort at incisional site from lumpectomy   SAFETY ISSUES:  Prior radiation? No  Pacemaker/ICD? No  Possible current pregnancy? No  Is the patient on methotrexate? No  Current Complaints / other details:  Nothing of note.    Zola Button, RN 09/25/2019,9:51 AM

## 2019-09-26 ENCOUNTER — Ambulatory Visit
Admission: RE | Admit: 2019-09-26 | Discharge: 2019-09-26 | Disposition: A | Discharge status: Home / Self Care | Payer: Medicare Other | Source: Ambulatory Visit | Attending: Radiation Oncology | Admitting: Radiation Oncology

## 2019-09-26 ENCOUNTER — Encounter: Payer: Self-pay | Admitting: Radiation Oncology

## 2019-09-26 ENCOUNTER — Other Ambulatory Visit: Payer: Self-pay

## 2019-09-26 VITALS — BP 138/65 | HR 64 | Temp 97.0°F | Resp 20 | Ht 64.0 in | Wt 140.0 lb

## 2019-09-26 DIAGNOSIS — C50512 Malignant neoplasm of lower-outer quadrant of left female breast: Secondary | ICD-10-CM

## 2019-09-26 DIAGNOSIS — Z170 Estrogen receptor positive status [ER+]: Secondary | ICD-10-CM | POA: Diagnosis not present

## 2019-09-26 DIAGNOSIS — C50111 Malignant neoplasm of central portion of right female breast: Secondary | ICD-10-CM

## 2019-09-26 DIAGNOSIS — Z9889 Other specified postprocedural states: Secondary | ICD-10-CM | POA: Diagnosis not present

## 2019-09-26 DIAGNOSIS — D0511 Intraductal carcinoma in situ of right breast: Secondary | ICD-10-CM | POA: Diagnosis not present

## 2019-09-26 NOTE — Progress Notes (Signed)
Radiation Oncology         [phone] ________________________________  Name: Robin Anderson MRN: [id number]  Date: 09/26/2019  DOB: 1940-04-30  Follow-Up Visit Note  Outpatient  CC: Andree Moro, DO  Daye, Deneda T, FNP  Diagnosis:      ICD-10-CM   1. Malignant neoplasm of lower-outer quadrant of left breast of female, estrogen receptor positive ([place])  C50.512    Z17.0   2. Malignant neoplasm of central portion of right breast in female, estrogen receptor positive ([place])  C50.111    Z17.0      Cancer Staging Malignant neoplasm of central portion of right breast in female, estrogen receptor positive ([place]) Staging form: Breast, AJCC 8th Edition - Pathologic stage from 09/05/2018: No Stage Recommended (ypT0, pN0, cM0) - Signed by Gardenia Phlegm, NP on 09/20/2019 - Clinical stage from 03/10/2019: Stage IA (cT48m, cN0, cM0, G1, ER+, PR+, HER2-) - Signed by CGardenia Phlegm NP on 09/20/2019  Malignant neoplasm of lower-outer quadrant of left breast of female, estrogen receptor positive ([place] Staging form: Breast, AJCC 8th Edition - Clinical stage from 02/17/2019: Stage IB (cT2, cN0, cM0, G2, ER+, PR+, HER2-) - Signed by GNicholas Lose MD on 02/17/2019 - Pathologic stage from 09/05/2019: No Stage Recommended (ypT2, pN0, cM0, G2, ER+, PR+, HER2-) - Signed by CGardenia Phlegm NP on 09/20/2019   CHIEF COMPLAINT: Here to discuss 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 She was seen in the multidisciplinary breast clinic on 02/14/2019.     She was started on neoadjuvant antiestrogen therapy with anastrozole on 02/17/2019.  She underwent bilateral mammography and left breast ultrasound on 05/12/19 showing treatment effect. Breast MRI on 08/07/19 again showed decreased enhancement in left breast and no further right breast enhancement.  She opted to proceed  with left lumpectomy on date of 09/05/19 with pathology report revealing: tumor size of 3 cm; histology of ductal carcinoma; margin status to invasive disease of present and margin status to in situ disease of negative; nodal status of negative (0/2); Grade 2. Right lumpectomy also performed that day was without residual disease.  She is scheduled for re-excision of the positive margins on 10/03/19.  Symptomatically, the patient reports: Patient states that her left arm/hand are always slightly more swollen, and that the left breast feels significantly more swollen and heavy than the right.   Pain issues, if any:  Some mild discomfort at incisional site from lumpectomy   SAFETY ISSUES:  Prior radiation? No  Pacemaker/ICD? No  Possible current pregnancy? No  Is the patient on methotrexate? No         ALLERGIES:  is allergic to penicillins.  Meds: Current Outpatient Medications  Medication Sig Dispense Refill  . anastrozole (ARIMIDEX) 1 MG tablet Take 1 tablet (1 mg total) by mouth daily. (Patient taking differently: Take 1 mg by mouth daily at 12 noon. ) 90 tablet 3  . aspirin EC 81 MG tablet Take 81 mg by mouth daily.    . benazepril-hydrochlorthiazide (LOTENSIN HCT) 20-25 MG tablet Take 1 tablet by mouth daily. 90 tablet 3  . calcium carbonate (CALCIUM 600) 600 MG TABS tablet Take 600 mg by mouth daily.    . Cholecalciferol (VITAMIN D3) 50 MCG (2000 UT) TABS Take 2,000 Units by mouth daily.    . primidone (MYSOLINE) 50 MG tablet TAKE 3 TABLETS BY MOUTH AT BEDTIME (Patient taking differently: Take  150 mg by mouth at bedtime. ) 270 tablet 3  . simvastatin (ZOCOR) 20 MG tablet TAKE 1 TABLET BY MOUTH EVERYDAY AT BEDTIME (Patient taking differently: Take 20 mg by mouth at bedtime. TAKE 1 TABLET BY MOUTH EVERYDAY AT BEDTIME) 90 tablet 2  . HYDROcodone-acetaminophen (NORCO/VICODIN) 5-325 MG tablet Take 1 tablet by mouth every 6 (six) hours as needed for moderate pain. (Patient not taking:  Reported on 09/21/2019) 15 tablet 0   No current facility-administered medications for this encounter.    Physical Findings:  height is '5\' 4"'$  (1.626 m) and weight is 140 lb (63.5 kg). Her temperature is 97 F (36.1 C) (abnormal). Her blood pressure is 138/65 and her pulse is 64. Her respiration is 20 and oxygen saturation is 99%. .     General: Alert and oriented, in no acute distress Psychiatric: Judgment and insight are intact. Affect is appropriate. Breast exam reveals healing lumpectomy scars bilaterally, healing axillary scar on left.   Lab Findings: Lab Results  Component Value Date   WBC 7.1 09/01/2019   HGB 12.0 09/01/2019   HCT 38.7 09/01/2019   MCV 89.4 09/01/2019   PLT 214 09/01/2019    Radiographic Findings: NM Sentinel Node Inj-No Rpt (Breast)  Result Date: 09/05/2019 Sulfur colloid was injected by the nuclear medicine technologist for melanoma sentinel node.   MM Breast Surgical Specimen  Result Date: 09/05/2019 CLINICAL DATA:  Right lumpectomy for right breast cancer. A radioactive seed was placed approximately 1 cm lateral to the dumbbell-shaped biopsy marker clip with a goal of including the area marked as well as the clip in the specimen. EXAM: SPECIMEN RADIOGRAPH OF THE RIGHT BREAST COMPARISON:  Previous exam(s). FINDINGS: Status post excision of the right breast. The radioactive seed and biopsy marker clip are present in 2 separate specimens, completely intact, and were marked for pathology. IMPRESSION: Specimen radiographs of the right breast. Electronically Signed   By: Claudie Revering M.D.   On: 09/05/2019 17:18   MM Breast Surgical Specimen  Result Date: 09/05/2019 CLINICAL DATA:  Two left lumpectomies for recently diagnosed left breast cancer, 1 with a ribbon shaped clip in the other with a coil shaped clip. The current specimen is for the coil shaped clip. EXAM: SPECIMEN RADIOGRAPH OF THE LEFT BREAST COMPARISON:  Previous exam(s). FINDINGS: Status post excision  of the left breast. The radioactive seed and coil shaped biopsy marker clip are present, completely intact, and were marked for pathology. There is also a mass with distortion and multiple calcifications within the specimen. IMPRESSION: Specimen radiograph of the left breast. Electronically Signed   By: Claudie Revering M.D.   On: 09/05/2019 17:08   MM Breast Surgical Specimen  Result Date: 09/05/2019 CLINICAL DATA:  Two left lumpectomies for recently diagnosed breast cancer, 1 marked with a ribbon shaped clip and the other marked coil shaped clip. The current images are for the specimen containing the ribbon shaped clip. EXAM: SPECIMEN RADIOGRAPH OF THE LEFT BREAST COMPARISON:  Previous exam(s). FINDINGS: Status post excision of the left breast. The ribbon shaped biopsy marker clip is present in the specimen, completely intact, and marked for pathology. The radioactive seed is in a separately imaged specimen cup. IMPRESSION: Specimen radiograph of the left breast. Electronically Signed   By: Claudie Revering M.D.   On: 09/05/2019 16:59   MM LT RADIOACTIVE SEED LOC MAMMO GUIDE  Result Date: 09/05/2019 CLINICAL DATA:  Patient for preoperative localization prior to left and right lumpectomies. There are 2 sites  within the left breast and 1 site within the right breast. The biopsy marking clip within the right breast was located slightly medial to the MRI identified mass. The seed was placed slightly lateral to the biopsy marking clip within the right breast. EXAM: MAMMOGRAPHIC GUIDED RADIOACTIVE SEED LOCALIZATION OF THE BILATERAL BREAST COMPARISON:  Previous exam(s). FINDINGS: Patient presents for radioactive seed localization prior to bilateral lumpectomies. I met with the patient and we discussed the procedure of seed localization including benefits and alternatives. We discussed the high likelihood of a successful procedure. We discussed the risks of the procedure including infection, bleeding, tissue injury and  further surgery. We discussed the low dose of radioactivity involved in the procedure. Informed, written consent was given. The usual time-out protocol was performed immediately prior to the procedure. Site 1: Right breast: Dumbbell shaped clip. Note the seed was placed approximately 1 cm lateral to the dumbbell-shaped clip given clip placement after MRI guided biopsy. Using mammographic guidance, sterile technique, 1% lidocaine and an I-125 radioactive seed, dumbbell-shaped clip was localized using a medial approach. The follow-up mammogram images confirm the seed in the expected location and were marked for Dr. Brantley Stage. Follow-up survey of the patient confirms presence of the radioactive seed. Order number of I-125 seed:  [id number]. Total activity:  9.373 millicuries reference Date: 09/01/2019 Site 2: Left breast: Coil shaped clip. Using mammographic guidance, sterile technique, 1% lidocaine and an I-125 radioactive seed, mass and coil shaped clip was localized using a medial approach. The follow-up mammogram images confirm the seed in the expected location and were marked for Dr. Brantley Stage. Follow-up survey of the patient confirms presence of the radioactive seed. Order number of I-125 seed:  [id number]. Total activity:  7.262 millicuries reference Date: 09/01/2019 Site 3: Left breast: Ribbon shaped clip. Using mammographic guidance, sterile technique, 1% lidocaine and an I-125 radioactive seed, ribbon shaped clip was localized using a cranial approach. The follow-up mammogram images confirm the seed in the expected location and were marked for Dr. Brantley Stage. Follow-up survey of the patient confirms presence of the radioactive seed. Order number of I-125 seed:  [id number]. Total activity:  3.845 millicuries reference Date: 08/16/2019 The patient tolerated the procedure well and was released from the [facility]. She was given instructions regarding seed removal. IMPRESSION: Radioactive seed localization bilateral  breast. No apparent complications. Note that the radioactive seed within the right breast was placed approximately 1 is cm lateral to the site of the biopsy marking clip as the clip was felt to be approximately 1.5 cm lateral to the MRI identified mass. Recommend removal of both the radioactive seed and the biopsy marking clip at the time of surgery. Electronically Signed   By: Lovey Newcomer M.D.   On: 09/05/2019 10:03   MM LT RAD SEED EA ADD LESION LOC MAMMO  Result Date: 09/05/2019 CLINICAL DATA:  Patient for preoperative localization prior to left and right lumpectomies. There are 2 sites within the left breast and 1 site within the right breast. The biopsy marking clip within the right breast was located slightly medial to the MRI identified mass. The seed was placed slightly lateral to the biopsy marking clip within the right breast. EXAM: MAMMOGRAPHIC GUIDED RADIOACTIVE SEED LOCALIZATION OF THE BILATERAL BREAST COMPARISON:  Previous exam(s). FINDINGS: Patient presents for radioactive seed localization prior to bilateral lumpectomies. I met with the patient and we discussed the procedure of seed localization including benefits and alternatives. We discussed the high likelihood of a successful procedure. We discussed  the risks of the procedure including infection, bleeding, tissue injury and further surgery. We discussed the low dose of radioactivity involved in the procedure. Informed, written consent was given. The usual time-out protocol was performed immediately prior to the procedure. Site 1: Right breast: Dumbbell shaped clip. Note the seed was placed approximately 1 cm lateral to the dumbbell-shaped clip given clip placement after MRI guided biopsy. Using mammographic guidance, sterile technique, 1% lidocaine and an I-125 radioactive seed, dumbbell-shaped clip was localized using a medial approach. The follow-up mammogram images confirm the seed in the expected location and were marked for Dr. Brantley Stage.  Follow-up survey of the patient confirms presence of the radioactive seed. Order number of I-125 seed:  [id number]. Total activity:  9.417 millicuries reference Date: 09/01/2019 Site 2: Left breast: Coil shaped clip. Using mammographic guidance, sterile technique, 1% lidocaine and an I-125 radioactive seed, mass and coil shaped clip was localized using a medial approach. The follow-up mammogram images confirm the seed in the expected location and were marked for Dr. Brantley Stage. Follow-up survey of the patient confirms presence of the radioactive seed. Order number of I-125 seed:  [id number]. Total activity:  5.631 millicuries reference Date: 09/01/2019 Site 3: Left breast: Ribbon shaped clip. Using mammographic guidance, sterile technique, 1% lidocaine and an I-125 radioactive seed, ribbon shaped clip was localized using a cranial approach. The follow-up mammogram images confirm the seed in the expected location and were marked for Dr. Brantley Stage. Follow-up survey of the patient confirms presence of the radioactive seed. Order number of I-125 seed:  [id number]. Total activity:  5.885 millicuries reference Date: 08/16/2019 The patient tolerated the procedure well and was released from the [facility]. She was given instructions regarding seed removal. IMPRESSION: Radioactive seed localization bilateral breast. No apparent complications. Note that the radioactive seed within the right breast was placed approximately 1 is cm lateral to the site of the biopsy marking clip as the clip was felt to be approximately 1.5 cm lateral to the MRI identified mass. Recommend removal of both the radioactive seed and the biopsy marking clip at the time of surgery. Electronically Signed   By: Lovey Newcomer M.D.   On: 09/05/2019 10:03   MM RT RADIOACTIVE SEED LOC MAMMO GUIDE  Result Date: 09/05/2019 CLINICAL DATA:  Patient for preoperative localization prior to left and right lumpectomies. There are 2 sites within the left breast and 1  site within the right breast. The biopsy marking clip within the right breast was located slightly medial to the MRI identified mass. The seed was placed slightly lateral to the biopsy marking clip within the right breast. EXAM: MAMMOGRAPHIC GUIDED RADIOACTIVE SEED LOCALIZATION OF THE BILATERAL BREAST COMPARISON:  Previous exam(s). FINDINGS: Patient presents for radioactive seed localization prior to bilateral lumpectomies. I met with the patient and we discussed the procedure of seed localization including benefits and alternatives. We discussed the high likelihood of a successful procedure. We discussed the risks of the procedure including infection, bleeding, tissue injury and further surgery. We discussed the low dose of radioactivity involved in the procedure. Informed, written consent was given. The usual time-out protocol was performed immediately prior to the procedure. Site 1: Right breast: Dumbbell shaped clip. Note the seed was placed approximately 1 cm lateral to the dumbbell-shaped clip given clip placement after MRI guided biopsy. Using mammographic guidance, sterile technique, 1% lidocaine and an I-125 radioactive seed, dumbbell-shaped clip was localized using a medial approach. The follow-up mammogram images confirm the seed in the expected location  and were marked for Dr. Brantley Stage. Follow-up survey of the patient confirms presence of the radioactive seed. Order number of I-125 seed:  [id number]. Total activity:  6.734 millicuries reference Date: 09/01/2019 Site 2: Left breast: Coil shaped clip. Using mammographic guidance, sterile technique, 1% lidocaine and an I-125 radioactive seed, mass and coil shaped clip was localized using a medial approach. The follow-up mammogram images confirm the seed in the expected location and were marked for Dr. Brantley Stage. Follow-up survey of the patient confirms presence of the radioactive seed. Order number of I-125 seed:  [id number]. Total activity:  9.735 millicuries  reference Date: 09/01/2019 Site 3: Left breast: Ribbon shaped clip. Using mammographic guidance, sterile technique, 1% lidocaine and an I-125 radioactive seed, ribbon shaped clip was localized using a cranial approach. The follow-up mammogram images confirm the seed in the expected location and were marked for Dr. Brantley Stage. Follow-up survey of the patient confirms presence of the radioactive seed. Order number of I-125 seed:  [id number]. Total activity:  3.419 millicuries reference Date: 08/16/2019 The patient tolerated the procedure well and was released from the [facility]. She was given instructions regarding seed removal. IMPRESSION: Radioactive seed localization bilateral breast. No apparent complications. Note that the radioactive seed within the right breast was placed approximately 1 is cm lateral to the site of the biopsy marking clip as the clip was felt to be approximately 1.5 cm lateral to the MRI identified mass. Recommend removal of both the radioactive seed and the biopsy marking clip at the time of surgery. Electronically Signed   By: Lovey Newcomer M.D.   On: 09/05/2019 10:03    Impression/Plan: Bilateral breast cancer  We discussed adjuvant radiotherapy today.  I recommend 4 weeks directed at the left breast and 3 weeks of radiotherapy to the right breast in order to reduce the risk of locoregional recurrences by 2/3.  The risks, benefits and side effects of this treatment were discussed in detail.  She understands that radiotherapy is associated with skin irritation and fatigue in the acute setting. Late effects can include cosmetic changes and rare injury to internal organs.  She is enthusiastic about proceeding with treatment. She understands that initially she needs more surgery to clear her margins -- surgery is scheduled.  I explained that the right breast is significantly lower risk than the left breast, but that treating the right breast is still very reasonable to pursue  simultaneously for the lowest chance of recurrence.  I will use special techniques to minimize exposure to her lungs and block her heart from radiotherapy.  She would like to pursue radiotherapy to both breasts.  Consent form was discussed in detail and signed today.  She will be referred back to me when appropriate for RT planning postoperatively.  We discussed measures to reduce the risk of infection during the COVID-19 pandemic.  She has been vaccinated.   On date of service, in total, I spent 30 minutes on this encounter.  She was seen in person. _____________________________________   Eppie Gibson, MD   This document serves as a record of services personally performed by Eppie Gibson, MD. It was created on her behalf by Wilburn Mylar, a trained medical scribe. The creation of this record is based on the scribe's personal observations and the provider's statements to them. This document has been checked and approved by the attending provider.

## 2019-09-28 ENCOUNTER — Encounter: Payer: Self-pay | Admitting: *Deleted

## 2019-09-29 ENCOUNTER — Other Ambulatory Visit (HOSPITAL_COMMUNITY)
Admission: RE | Admit: 2019-09-29 | Discharge: 2019-09-29 | Disposition: A | Discharge status: Home / Self Care | Payer: Medicare Other | Source: Ambulatory Visit | Attending: Surgery | Admitting: Surgery

## 2019-09-29 DIAGNOSIS — Z01812 Encounter for preprocedural laboratory examination: Secondary | ICD-10-CM | POA: Diagnosis not present

## 2019-09-29 DIAGNOSIS — Z20822 Contact with and (suspected) exposure to covid-19: Secondary | ICD-10-CM | POA: Diagnosis not present

## 2019-09-29 LAB — SARS CORONAVIRUS 2 (TAT 6-24 HRS): SARS Coronavirus 2: NEGATIVE

## 2019-10-02 ENCOUNTER — Other Ambulatory Visit: Payer: Self-pay

## 2019-10-02 ENCOUNTER — Encounter (HOSPITAL_COMMUNITY): Payer: Self-pay | Admitting: Surgery

## 2019-10-02 NOTE — Progress Notes (Signed)
.  Pt denies SOB and chest pain. {Pt stated that she is under the care of Truitt Merle, NP, Cardiology and Dr. Andree Moro, PCP. Pt denies having an echo and cardiac cath. Pt denies having a chest x ray in the last year. Pt made aware to stop taking Aspirin (unless otherwise advised by surgeon), vitamins, fish oil and herbal medications. Do not take any NSAIDs ie: Ibuprofen, Advil, Naproxen (Aleve), Motrin, BC and Goody Powder. Pt reminded to quarantine. Pt verbalized understanding of all pre-op instructions.

## 2019-10-03 ENCOUNTER — Encounter: Payer: Self-pay | Admitting: *Deleted

## 2019-10-03 ENCOUNTER — Ambulatory Visit (HOSPITAL_COMMUNITY): Payer: Medicare Other | Admitting: Certified Registered"

## 2019-10-03 ENCOUNTER — Encounter (HOSPITAL_COMMUNITY)
Admission: RE | Disposition: A | Discharge status: Home / Self Care | Payer: Self-pay | Source: Home / Self Care | Attending: Surgery

## 2019-10-03 ENCOUNTER — Encounter (HOSPITAL_COMMUNITY): Payer: Self-pay | Admitting: Surgery

## 2019-10-03 ENCOUNTER — Ambulatory Visit (HOSPITAL_COMMUNITY)
Admission: RE | Admit: 2019-10-03 | Discharge: 2019-10-03 | Disposition: A | Discharge status: Home / Self Care | Payer: Medicare Other | Attending: Surgery | Admitting: Surgery

## 2019-10-03 DIAGNOSIS — I10 Essential (primary) hypertension: Secondary | ICD-10-CM | POA: Diagnosis not present

## 2019-10-03 DIAGNOSIS — Z87891 Personal history of nicotine dependence: Secondary | ICD-10-CM | POA: Insufficient documentation

## 2019-10-03 DIAGNOSIS — C50912 Malignant neoplasm of unspecified site of left female breast: Secondary | ICD-10-CM | POA: Diagnosis not present

## 2019-10-03 HISTORY — DX: Unspecified cataract: H26.9

## 2019-10-03 HISTORY — DX: Presence of spectacles and contact lenses: Z97.3

## 2019-10-03 HISTORY — PX: RE-EXCISION OF BREAST LUMPECTOMY: SHX6048

## 2019-10-03 LAB — CBC WITH DIFFERENTIAL/PLATELET
Abs Immature Granulocytes: 0.01 10*3/uL (ref 0.00–0.07)
Basophils Absolute: 0.1 10*3/uL (ref 0.0–0.1)
Basophils Relative: 1 %
Eosinophils Absolute: 0.4 10*3/uL (ref 0.0–0.5)
Eosinophils Relative: 6 %
HCT: 38.9 % (ref 36.0–46.0)
Hemoglobin: 12.4 g/dL (ref 12.0–15.0)
Immature Granulocytes: 0 %
Lymphocytes Relative: 15 %
Lymphs Abs: 1 10*3/uL (ref 0.7–4.0)
MCH: 28.2 pg (ref 26.0–34.0)
MCHC: 31.9 g/dL (ref 30.0–36.0)
MCV: 88.4 fL (ref 80.0–100.0)
Monocytes Absolute: 0.5 10*3/uL (ref 0.1–1.0)
Monocytes Relative: 7 %
Neutro Abs: 4.6 10*3/uL (ref 1.7–7.7)
Neutrophils Relative %: 71 %
Platelets: 220 10*3/uL (ref 150–400)
RBC: 4.4 MIL/uL (ref 3.87–5.11)
RDW: 13.1 % (ref 11.5–15.5)
WBC: 6.5 10*3/uL (ref 4.0–10.5)
nRBC: 0 % (ref 0.0–0.2)

## 2019-10-03 LAB — COMPREHENSIVE METABOLIC PANEL
ALT: 12 U/L (ref 0–44)
AST: 17 U/L (ref 15–41)
Albumin: 4.1 g/dL (ref 3.5–5.0)
Alkaline Phosphatase: 87 U/L (ref 38–126)
Anion gap: 10 (ref 5–15)
BUN: 11 mg/dL (ref 8–23)
CO2: 28 mmol/L (ref 22–32)
Calcium: 9.6 mg/dL (ref 8.9–10.3)
Chloride: 99 mmol/L (ref 98–111)
Creatinine, Ser: 1.02 mg/dL — ABNORMAL HIGH (ref 0.44–1.00)
GFR calc Af Amer: >60 mL/min (ref >60)
GFR calc non Af Amer: 53 mL/min — ABNORMAL LOW (ref >60)
Glucose, Bld: 110 mg/dL — ABNORMAL HIGH (ref 70–99)
Potassium: 3 mmol/L — ABNORMAL LOW (ref 3.5–5.1)
Sodium: 137 mmol/L (ref 135–145)
Total Bilirubin: 0.7 mg/dL (ref 0.3–1.2)
Total Protein: 7.6 g/dL (ref 6.5–8.1)

## 2019-10-03 SURGERY — EXCISION, LESION, BREAST
Anesthesia: General | Site: Breast | Laterality: Left

## 2019-10-03 MED ORDER — PROPOFOL 10 MG/ML IV BOLUS
INTRAVENOUS | Status: DC | PRN
Start: 2019-10-03 — End: 2019-10-03
  Administered 2019-10-03: 150 mg via INTRAVENOUS
  Administered 2019-10-03: 50 mg via INTRAVENOUS

## 2019-10-03 MED ORDER — LIDOCAINE 2% (20 MG/ML) 5 ML SYRINGE
INTRAMUSCULAR | Status: DC | PRN
Start: 2019-10-03 — End: 2019-10-03
  Administered 2019-10-03: 100 mg via INTRAVENOUS

## 2019-10-03 MED ORDER — BUPIVACAINE HCL (PF) 0.25 % IJ SOLN
INTRAMUSCULAR | Status: AC
  Filled 2019-10-03: qty 30

## 2019-10-03 MED ORDER — FENTANYL CITRATE (PF) 250 MCG/5ML IJ SOLN
INTRAMUSCULAR | Status: DC | PRN
  Administered 2019-10-03: 50 ug via INTRAVENOUS
  Administered 2019-10-03 (×2): 25 ug via INTRAVENOUS

## 2019-10-03 MED ORDER — LABETALOL HCL 5 MG/ML IV SOLN
INTRAVENOUS | Status: DC | PRN
  Administered 2019-10-03: 5 mg via INTRAVENOUS
  Administered 2019-10-03 (×2): 2.5 mg via INTRAVENOUS

## 2019-10-03 MED ORDER — CHLORHEXIDINE GLUCONATE CLOTH 2 % EX PADS
6.0000 | MEDICATED_PAD | Freq: Once | CUTANEOUS | Status: DC
Start: 2019-10-03 — End: 2019-10-03

## 2019-10-03 MED ORDER — CHLORHEXIDINE GLUCONATE 0.12 % MT SOLN
15.0000 mL | Freq: Once | OROMUCOSAL | Status: AC
  Administered 2019-10-03: 15 mL via OROMUCOSAL
  Filled 2019-10-03: qty 15

## 2019-10-03 MED ORDER — HYDROMORPHONE HCL 1 MG/ML IJ SOLN: 0.2500 mg | INTRAMUSCULAR | Status: DC | PRN

## 2019-10-03 MED ORDER — CLINDAMYCIN PHOSPHATE 900 MG/50ML IV SOLN
900.0000 mg | INTRAVENOUS | Status: AC
  Administered 2019-10-03: 900 mg via INTRAVENOUS
  Filled 2019-10-03: qty 50

## 2019-10-03 MED ORDER — ONDANSETRON HCL 4 MG/2ML IJ SOLN
INTRAMUSCULAR | Status: DC | PRN
Start: 2019-10-03 — End: 2019-10-03
  Administered 2019-10-03: 4 mg via INTRAVENOUS

## 2019-10-03 MED ORDER — ONDANSETRON HCL 4 MG/2ML IJ SOLN: 4.0000 mg | Freq: Once | INTRAMUSCULAR | Status: DC | PRN

## 2019-10-03 MED ORDER — PHENYLEPHRINE 40 MCG/ML (10ML) SYRINGE FOR IV PUSH (FOR BLOOD PRESSURE SUPPORT)
PREFILLED_SYRINGE | INTRAVENOUS | Status: DC | PRN
  Administered 2019-10-03: 120 ug via INTRAVENOUS
  Administered 2019-10-03 (×2): 80 ug via INTRAVENOUS

## 2019-10-03 MED ORDER — PHENYLEPHRINE HCL-NACL 10-0.9 MG/250ML-% IV SOLN
INTRAVENOUS | Status: DC | PRN
  Administered 2019-10-03: 25 ug/min via INTRAVENOUS

## 2019-10-03 MED ORDER — 0.9 % SODIUM CHLORIDE (POUR BTL) OPTIME
TOPICAL | Status: DC | PRN
  Administered 2019-10-03: 1000 mL

## 2019-10-03 MED ORDER — DEXAMETHASONE SODIUM PHOSPHATE 10 MG/ML IJ SOLN
INTRAMUSCULAR | Status: DC | PRN
  Administered 2019-10-03: 5 mg via INTRAVENOUS

## 2019-10-03 MED ORDER — ORAL CARE MOUTH RINSE: 15.0000 mL | Freq: Once | OROMUCOSAL | Status: AC

## 2019-10-03 MED ORDER — BUPIVACAINE HCL (PF) 0.25 % IJ SOLN
INTRAMUSCULAR | Status: DC | PRN
  Administered 2019-10-03: 19 mL

## 2019-10-03 MED ORDER — STERILE WATER FOR IRRIGATION IR SOLN
Status: DC | PRN
  Administered 2019-10-03: 1000 mL

## 2019-10-03 MED ORDER — LACTATED RINGERS IV SOLN: INTRAVENOUS | Status: DC

## 2019-10-03 MED ORDER — CHLORHEXIDINE GLUCONATE CLOTH 2 % EX PADS: 6.0000 | MEDICATED_PAD | Freq: Once | CUTANEOUS | Status: DC

## 2019-10-03 MED ORDER — MEPERIDINE HCL 25 MG/ML IJ SOLN: 6.2500 mg | INTRAMUSCULAR | Status: DC | PRN

## 2019-10-03 MED ORDER — HYDROCODONE-ACETAMINOPHEN 5-325 MG PO TABS
1.0000 | ORAL_TABLET | Freq: Four times a day (QID) | ORAL | 0 refills | Status: DC | PRN
Start: 2019-10-03 — End: 2019-10-17

## 2019-10-03 MED ORDER — PROPOFOL 10 MG/ML IV BOLUS
INTRAVENOUS | Status: AC
  Filled 2019-10-03: qty 40

## 2019-10-03 MED ORDER — FENTANYL CITRATE (PF) 250 MCG/5ML IJ SOLN
INTRAMUSCULAR | Status: AC
  Filled 2019-10-03: qty 5

## 2019-10-03 SURGICAL SUPPLY — 36 items
APPLIER CLIP 9.375 MED OPEN (MISCELLANEOUS) ×2
BINDER BREAST LRG (GAUZE/BANDAGES/DRESSINGS) ×2 IMPLANT
CANISTER SUCT 3000ML PPV (MISCELLANEOUS) ×2 IMPLANT
CHLORAPREP W/TINT 26 (MISCELLANEOUS) ×2 IMPLANT
CLIP APPLIE 9.375 MED OPEN (MISCELLANEOUS) ×1 IMPLANT
CNTNR URN SCR LID CUP LEK RST (MISCELLANEOUS) ×6 IMPLANT
CONT SPEC 4OZ STRL OR WHT (MISCELLANEOUS) ×12
DECANTER SPIKE VIAL GLASS SM (MISCELLANEOUS) ×2 IMPLANT
DERMABOND ADVANCED (GAUZE/BANDAGES/DRESSINGS) ×1
DERMABOND ADVANCED .7 DNX12 (GAUZE/BANDAGES/DRESSINGS) ×1 IMPLANT
DRAPE CHEST BREAST 15X10 FENES (DRAPES) ×2 IMPLANT
ELECT BLADE 6.5 EXT (BLADE) ×2 IMPLANT
ELECT REM PT RETURN 9FT ADLT (ELECTROSURGICAL) ×2
ELECTRODE REM PT RTRN 9FT ADLT (ELECTROSURGICAL) ×1 IMPLANT
GAUZE SPONGE 4X4 12PLY STRL (GAUZE/BANDAGES/DRESSINGS) ×2 IMPLANT
GLOVE BIO SURGEON STRL SZ8 (GLOVE) ×2 IMPLANT
GLOVE BIOGEL PI IND STRL 8 (GLOVE) ×1 IMPLANT
GLOVE BIOGEL PI INDICATOR 8 (GLOVE) ×1
GOWN STRL REUS W/ TWL LRG LVL3 (GOWN DISPOSABLE) ×2 IMPLANT
GOWN STRL REUS W/ TWL XL LVL3 (GOWN DISPOSABLE) ×1 IMPLANT
GOWN STRL REUS W/TWL LRG LVL3 (GOWN DISPOSABLE) ×4
GOWN STRL REUS W/TWL XL LVL3 (GOWN DISPOSABLE) ×2
KIT BASIN OR (CUSTOM PROCEDURE TRAY) ×2 IMPLANT
KIT MARKER MARGIN INK (KITS) ×2 IMPLANT
KIT TURNOVER KIT B (KITS) ×2 IMPLANT
NEEDLE HYPO 25GX1X1/2 BEV (NEEDLE) ×2 IMPLANT
NS IRRIG 1000ML POUR BTL (IV SOLUTION) ×2 IMPLANT
PACK GENERAL/GYN (CUSTOM PROCEDURE TRAY) ×2 IMPLANT
PAD ABD 8X10 STRL (GAUZE/BANDAGES/DRESSINGS) ×2 IMPLANT
PAD ARMBOARD 7.5X6 YLW CONV (MISCELLANEOUS) ×2 IMPLANT
PENCIL SMOKE EVACUATOR (MISCELLANEOUS) ×2 IMPLANT
SLEEVE SUCTION CATH 165 (SLEEVE) ×2 IMPLANT
SUT MNCRL AB 4-0 PS2 18 (SUTURE) ×2 IMPLANT
SUT VIC AB 3-0 SH 18 (SUTURE) ×2 IMPLANT
SYR CONTROL 10ML LL (SYRINGE) ×2 IMPLANT
TOWEL GREEN STERILE FF (TOWEL DISPOSABLE) ×2 IMPLANT

## 2019-10-03 NOTE — Interval H&P Note (Signed)
History and Physical Interval Note:  10/03/2019 7:18 AM  Robin Anderson  has presented today for surgery, with the diagnosis of LEFT BREAST CANCER.  The various methods of treatment have been discussed with the patient and family. After consideration of risks, benefits and other options for treatment, the patient has consented to  Procedure(s): LEFT BREAST RE-EXCISION OF BREAST LUMPECTOMY (Left) as a surgical intervention.  The patient's history has been reviewed, patient examined, no change in status, stable for surgery.  I have reviewed the patient's chart and labs.  Questions were answered to the patient's satisfaction.     [place]

## 2019-10-03 NOTE — Anesthesia Postprocedure Evaluation (Signed)
Anesthesia Post Note  Patient: Melody Cirrincione  Procedure(s) Performed: LEFT BREAST RE-EXCISION OF BREAST LUMPECTOMY (Left Breast)     Patient location during evaluation: PACU Anesthesia Type: General Level of consciousness: awake and alert Pain management: pain level controlled Vital Signs Assessment: post-procedure vital signs reviewed and stable Respiratory status: spontaneous breathing, nonlabored ventilation, respiratory function stable and patient connected to nasal cannula oxygen Cardiovascular status: blood pressure returned to baseline and stable Postop Assessment: no apparent nausea or vomiting Anesthetic complications: no   No complications documented.  Last Vitals:  Vitals:   10/03/19 0900 10/03/19 0910  BP: (!) 163/77 (!) 167/71  Pulse: (!) 55 (!) 55  Resp: 16 15  Temp:  (!) 36.3 C  SpO2: 98% 96%    Last Pain:  Vitals:   10/03/19 0910  PainSc: [place] DAVID

## 2019-10-03 NOTE — Discharge Instructions (Signed)
[facility] °Office Phone Number [phone] ° °BREAST BIOPSY/ PARTIAL MASTECTOMY: POST OP INSTRUCTIONS ° °Always review your discharge instruction sheet given to you by the facility where your surgery was performed. ° °IF YOU HAVE DISABILITY OR FAMILY LEAVE FORMS, YOU MUST BRING THEM TO THE OFFICE FOR PROCESSING.  DO NOT GIVE THEM TO YOUR DOCTOR. ° °1. A prescription for pain medication may be given to you upon discharge.  Take your pain medication as prescribed, if needed.  If narcotic pain medicine is not needed, then you may take acetaminophen (Tylenol) or ibuprofen (Advil) as needed. °2. Take your usually prescribed medications unless otherwise directed °3. If you need a refill on your pain medication, please contact your pharmacy.  They will contact our office to request authorization.  Prescriptions will not be filled after 5pm or on week-ends. °4. You should eat very light the first 24 hours after surgery, such as soup, crackers, pudding, etc.  Resume your normal diet the day after surgery. °5. Most patients will experience some swelling and bruising in the breast.  Ice packs and a good support bra will help.  Swelling and bruising can take several days to resolve.  °6. It is common to experience some constipation if taking pain medication after surgery.  Increasing fluid intake and taking a stool softener will usually help or prevent this problem from occurring.  A mild laxative (Milk of Magnesia or Miralax) should be taken according to package directions if there are no bowel movements after 48 hours. °7. Unless discharge instructions indicate otherwise, you may remove your bandages 24-48 hours after surgery, and you may shower at that time.  You may have steri-strips (small skin tapes) in place directly over the incision.  These strips should be left on the skin for 7-10 days.  If your surgeon used skin glue on the incision, you may shower in 24 hours.  The glue will flake off over the  next 2-3 weeks.  Any sutures or staples will be removed at the office during your follow-up visit. °8. ACTIVITIES:  You may resume regular daily activities (gradually increasing) beginning the next day.  Wearing a good support bra or sports bra minimizes pain and swelling.  You may have sexual intercourse when it is comfortable. °a. You may drive when you no longer are taking prescription pain medication, you can comfortably wear a seatbelt, and you can safely maneuver your car and apply brakes. °b. RETURN TO WORK:  ______________________________________________________________________________________ °9. You should see your doctor in the office for a follow-up appointment approximately two weeks after your surgery.  Your doctor’s nurse will typically make your follow-up appointment when she calls you with your pathology report.  Expect your pathology report 2-3 business days after your surgery.  You may call to check if you do not hear from us after three days. °10. OTHER INSTRUCTIONS: _______________________________________________________________________________________________ _____________________________________________________________________________________________________________________________________ °_____________________________________________________________________________________________________________________________________ °_____________________________________________________________________________________________________________________________________ ° °WHEN TO CALL YOUR DOCTOR: °1. Fever over 101.0 °2. Nausea and/or vomiting. °3. Extreme swelling or bruising. °4. Continued bleeding from incision. °5. Increased pain, redness, or drainage from the incision. ° °The clinic staff is available to answer your questions during regular business hours.  Please don’t hesitate to call and ask to speak to one of the nurses for clinical concerns.  If you have a medical emergency, go to the nearest  emergency room or call 911.  A surgeon from [facility] is always on call at the hospital. ° °For further questions, please visit [email or web address]  °

## 2019-10-03 NOTE — Anesthesia Procedure Notes (Signed)
Procedure Name: LMA Insertion Date/Time: 10/03/2019 7:36 AM Performed by: Lieutenant Diego, CRNA Pre-anesthesia Checklist: Patient identified, Emergency Drugs available, Suction available and Patient being monitored Patient Re-evaluated:Patient Re-evaluated prior to induction Oxygen Delivery Method: Circle system utilized Preoxygenation: Pre-oxygenation with 100% oxygen Induction Type: IV induction LMA: LMA inserted LMA Size: 3.0 Number of attempts: 1 Airway Equipment and Method: Bite block Placement Confirmation: positive ETCO2 Tube secured with: Tape Dental Injury: Teeth and Oropharynx as per pre-operative assessment  Comments: Placed by D. [place], [place]

## 2019-10-03 NOTE — Transfer of Care (Signed)
Immediate Anesthesia Transfer of Care Note  Patient: Robin Anderson  Procedure(s) Performed: LEFT BREAST RE-EXCISION OF BREAST LUMPECTOMY (Left Breast)  Patient Location: PACU  Anesthesia Type:General  Level of Consciousness: drowsy  Airway & Oxygen Therapy: Patient Spontanous Breathing and Patient connected to face mask oxygen  Post-op Assessment: Report given to RN and Post -op Vital signs reviewed and stable  Post vital signs: Reviewed and stable  Last Vitals:  Vitals Value Taken Time  BP 216/107 10/03/19 0840  Temp    Pulse 69 10/03/19 0841  Resp 17 10/03/19 0841  SpO2 100 % 10/03/19 0841  Vitals shown include unvalidated device data.  Last Pain:  Vitals:   10/03/19 0641  PainSc: 0-No pain         Complications: No complications documented.

## 2019-10-03 NOTE — Op Note (Signed)
Left Breast Re-excison Lumpectomy Procedure Note  Indications:  This patient returns following an initial lumpectomy.  Analysis of the pathology specimen revealed microscopic involvement of the medial, lateral, posterior, anterior, inferior and superior margins.  The patient now returns for re-excision.  Pre-operative Diagnosis: left breast cancer  Post-operative Diagnosis: left breast cancer  Surgeon: Turner Daniels  MD   Assistants: OR staff  Anesthesia: General endotracheal anesthesia and Local anesthesia 0.25.% bupivacaine  ASA Class: 3  Procedure Details  The patient was seen in the Holding Room. The risks, benefits, complications, treatment options, and expected outcomes were discussed with the patient. The possibilities of reaction to medication, pulmonary aspiration, bleeding, infection, the need for additional procedures, failure to diagnose a condition, and creating a complication requiring transfusion or operation were discussed with the patient. The patient concurred with the proposed plan, giving informed consent.  The site of surgery properly noted/marked. The patient was taken to Operating Room # 2, identified as Robin Anderson and the procedure verified as Breast Re-excision Lumpectomy. A Time Out was held and the above information confirmed.  the patient was placed supine.  The breast was prepped and draped in standard fashion. Marcaine 0.25% with epinephrine was used to anesthetize the skin around the previous lumpectomy incision.  The inferior  incision was opened.  A  seroma was evacuated.  Additional local anesthesia was delivered medial, lateral, posterior, anterior, inferior and superiorly within the lumpectomy cavity.  A full thickness re-excision was performed.  An orientation suture was placed anteriorly.  The new margin was inked and the specimen was submitted to pathology.  Hemostasis was achieved with cautery.  Closure was performed in 2 layers with a 3-0 Vicryl  4 -0  subcuticular closure.    Dermabond  applied.  At the end of the operation, all sponge, instrument and needle counts were correct.   Findings: grossly clear surgical margins  Estimated Blood Loss:  Minimal         Drains: none          Total IV Fluids: per record          Specimens: see above          Implants: none          Complications:  None; patient tolerated the procedure well.         Disposition: PACU - hemodynamically stable.         Condition: stable  Attending Attestation: I performed the procedure.

## 2019-10-03 NOTE — Anesthesia Preprocedure Evaluation (Signed)
Anesthesia Evaluation  Patient identified by MRN, date of birth, ID band Patient awake    Reviewed: Allergy & Precautions, NPO status , Patient's Chart, lab work & pertinent test results  Airway Mallampati: I  TM Distance: >3 FB Neck ROM: Full    Dental   Pulmonary former smoker,    Pulmonary exam normal        Cardiovascular hypertension, Pt. on medications Normal cardiovascular exam     Neuro/Psych    GI/Hepatic   Endo/Other    Renal/GU      Musculoskeletal   Abdominal   Peds  Hematology   Anesthesia Other Findings   Reproductive/Obstetrics                             Anesthesia Physical Anesthesia Plan  ASA: II  Anesthesia Plan: General   Post-op Pain Management:    Induction: Intravenous  PONV Risk Score and Plan: 3 and Ondansetron, Midazolam and Treatment may vary due to age or medical condition  Airway Management Planned: LMA  Additional Equipment:   Intra-op Plan:   Post-operative Plan: Extubation in OR  Informed Consent: I have reviewed the patients History and Physical, chart, labs and discussed the procedure including the risks, benefits and alternatives for the proposed anesthesia with the patient or authorized representative who has indicated his/her understanding and acceptance.       Plan Discussed with: CRNA and Surgeon  Anesthesia Plan Comments:         Anesthesia Quick Evaluation

## 2019-10-04 ENCOUNTER — Encounter (HOSPITAL_COMMUNITY): Payer: Self-pay | Admitting: Surgery

## 2019-10-04 LAB — SURGICAL PATHOLOGY

## 2019-10-09 ENCOUNTER — Other Ambulatory Visit: Payer: Self-pay | Admitting: *Deleted

## 2019-10-09 ENCOUNTER — Encounter: Payer: Self-pay | Admitting: *Deleted

## 2019-10-09 DIAGNOSIS — Z170 Estrogen receptor positive status [ER+]: Secondary | ICD-10-CM

## 2019-10-09 DIAGNOSIS — C50512 Malignant neoplasm of lower-outer quadrant of left female breast: Secondary | ICD-10-CM

## 2019-10-10 NOTE — Progress Notes (Signed)
CARDIOLOGY OFFICE NOTE  Date:  10/17/2019    Robin Anderson Date of Birth: [birth year] Medical Record #[id number]  PCP:  Andree Moro, DO  Cardiologist:  Cyndra Numbers  Chief Complaint  Patient presents with  . Follow-up    History of Present Illness: Robin Anderson is a 79 y.o. female who presents today for a 6 month check. She is a former patient of Dr. Winnifred Friar. Was to establish with Dr. Cheryll Cockayne never did.She has seen me over the past several years.  She has had atypical chest pain, HTN, HLD and glucose intolerance. Other issues as noted below and include seizure disorder. Remote normal Myoview from 2004 noted.   Has seen me over the past several years - hadstopped her Tegretolat a prior visitand I advised her to alert neurology.She has tended to stop medicines on her ownand we have added back and made adjustments as needed.She had been found to have breast cancer. Was on anti-estrogen therapy and then would have surgery followed by XRT.  Last seen in December - she was felt to be doing ok. She had been found to have cancer in both breasts and was awaiting surgery.   The patient does not have symptoms concerning for COVID-19 infection (fever, chills, cough, or new shortness of breath).   Comes in today. Here alone. She has ended up 2 surgeries per her report for her breast cancer. To start radiation soon. Does not sound like any chemo will be needed - she is on Arimidex. This makes her left arm swell and feel a little tight at time - using a squeeze ball and this helps. Otherwise feels alright. No chest pain. Breathing is ok. Potassium was low at the time of her last surgery.   Past Medical History:  Diagnosis Date  . Blood in stool   . Cancer [facility])    breast - left  . Cataracts, bilateral   . Chest pain, unspecified   . Family history of breast cancer   . Headache 07/02/2014  . Heart murmur   . Hepatitis   . History of colon polyps   . HLD  (hyperlipidemia)   . HTN (hypertension)   . Prediabetes 07/28/2011   no meds, diet controlled  . Seizure disorder ([place])    seizures controlled with med  . Wears glasses     Past Surgical History:  Procedure Laterality Date  . ABDOMINAL HERNIA REPAIR     lower mid line hernia which has reherniatied  . BREAST BIOPSY Left 01/31/2019   Malignant  . BREAST LUMPECTOMY WITH RADIOACTIVE SEED AND SENTINEL LYMPH NODE BIOPSY N/A 09/05/2019   Procedure: RIGHT BREAST SEED LUMPECTOMY X 1, LEFT BREAST SEED LUMPECTOMY X 2, LEFT AXILLARY SENTINEL LYMPH NODE BIOPSY;  Surgeon: Erroll Luna, MD;  Location: [facility];  Service: General;  Laterality: N/A;  . BUNIONECTOMY Right    traumatic injury to her foot that was repaired  . RE-EXCISION OF BREAST LUMPECTOMY Left 10/03/2019   Procedure: LEFT BREAST RE-EXCISION OF BREAST LUMPECTOMY;  Surgeon: Erroll Luna, MD;  Location: [facility];  Service: General;  Laterality: Left;  . TONSILLECTOMY    . TUBAL LIGATION       Medications: Current Meds  Medication Sig  . anastrozole (ARIMIDEX) 1 MG tablet Take 1 tablet (1 mg total) by mouth daily.  Marland Kitchen aspirin EC 81 MG tablet Take 81 mg by mouth daily.  . benazepril-hydrochlorthiazide (LOTENSIN HCT) 20-25 MG tablet Take 1 tablet by mouth daily.  Marland Kitchen  calcium carbonate (CALCIUM 600) 600 MG TABS tablet Take 600 mg by mouth daily.  . Cholecalciferol (VITAMIN D3) 50 MCG (2000 UT) TABS Take 2,000 Units by mouth daily.  . primidone (MYSOLINE) 50 MG tablet TAKE 3 TABLETS BY MOUTH AT BEDTIME  . simvastatin (ZOCOR) 20 MG tablet TAKE 1 TABLET BY MOUTH EVERYDAY AT BEDTIME     Allergies: Allergies  Allergen Reactions  . Penicillins Rash    Social History: The patient  reports that she has quit smoking. Her smoking use included cigarettes. She has never used smokeless tobacco. She reports that she does not drink alcohol and does not use drugs.   Family History: The patient's family history includes Breast cancer in her  sister; Heart attack in her daughter and father; Hypertension in her brother, mother, and sister.   Review of Systems: Please see the history of present illness.   All other systems are reviewed and negative.   Physical Exam: VS:  BP 130/70   Pulse 71   Ht 5\' 3"  (1.6 m)   Wt 139 lb (63 kg)   LMP  (LMP Unknown)   SpO2 99%   BMI 24.62 kg/m  .  BMI Body mass index is 24.62 kg/m.  Wt Readings from Last 3 Encounters:  10/17/19 139 lb (63 kg)  10/03/19 140 lb (63.5 kg)  09/26/19 140 lb (63.5 kg)    General: Pleasant. Well developed, well nourished and in no acute distress.  Her weight is down 4 pounds.  HEENT: Normal.  Neck: Supple, no JVD, carotid bruits, or masses noted.  Cardiac: Regular rate and rhythm. No murmurs, rubs, or gallops. No edema.  Respiratory:  Lungs are clear to auscultation bilaterally with normal work of breathing.  GI: Soft and nontender.  MS: No deformity or atrophy. Gait and ROM intact.  Skin: Warm and dry. Color is normal.  Neuro:  Strength and sensation are intact and no gross focal deficits noted.  Psych: Alert, appropriate and with normal affect.   LABORATORY DATA:  EKG:  EKG is not ordered today.    Lab Results  Component Value Date   WBC 6.5 10/03/2019   HGB 12.4 10/03/2019   HCT 38.9 10/03/2019   PLT 220 10/03/2019   GLUCOSE 110 (H) 10/03/2019   CHOL 172 04/11/2019   TRIG 59 04/11/2019   HDL 79 04/11/2019   LDLCALC 81 04/11/2019   ALT 12 10/03/2019   AST 17 10/03/2019   NA 137 10/03/2019   K 3.0 (L) 10/03/2019   CL 99 10/03/2019   CREATININE 1.02 (H) 10/03/2019   BUN 11 10/03/2019   CO2 28 10/03/2019   TSH 1.880 09/01/2017   HGBA1C 5.8 08/10/2012     BNP (last 3 results) No results for input(s): BNP in the last 8760 hours.  ProBNP (last 3 results) No results for input(s): PROBNP in the last 8760 hours.   Other Studies Reviewed Today:  MRI IMPRESSION of brain 06/2014:  Unremarkable MRI brain (without). Mild diffuse  atrophy. No acute findings.    ASSESSMENT & PLAN:   1. HTN - BP is fine on her current regimen.   2. Breast cancer - plan per oncology - looks to be having radiation starting soon.   3. HLD - on statin  4. Hypokalemia - recheck today.   5. Seizure disorder - not discussed.   6. History of asymptomatic bradycardia - not noted today. HR is fine.   Current medicines are reviewed with the patient today.  The  patient does not have concerns regarding medicines other than what has been noted above.  The following changes have been made:  See above.  Labs/ tests ordered today include:    Orders Placed This Encounter  Procedures  . Basic metabolic panel     Disposition:   FU with me in 6 months.   Patient is agreeable to this plan and will call if any problems develop in the interim.   SignedTruitt Merle, NP  10/17/2019 12:20 PM  [facility] [address] [place], [place]  [address] Phone: [phone] Fax: [phone]

## 2019-10-11 ENCOUNTER — Encounter: Payer: Self-pay | Admitting: *Deleted

## 2019-10-17 ENCOUNTER — Other Ambulatory Visit: Payer: Self-pay

## 2019-10-17 ENCOUNTER — Ambulatory Visit (INDEPENDENT_AMBULATORY_CARE_PROVIDER_SITE_OTHER): Payer: Medicare Other | Admitting: Nurse Practitioner

## 2019-10-17 ENCOUNTER — Encounter: Payer: Self-pay | Admitting: Nurse Practitioner

## 2019-10-17 VITALS — BP 130/70 | HR 71 | Ht 63.0 in | Wt 139.0 lb

## 2019-10-17 DIAGNOSIS — E876 Hypokalemia: Secondary | ICD-10-CM | POA: Diagnosis not present

## 2019-10-17 DIAGNOSIS — I10 Essential (primary) hypertension: Secondary | ICD-10-CM

## 2019-10-17 DIAGNOSIS — E7800 Pure hypercholesterolemia, unspecified: Secondary | ICD-10-CM

## 2019-10-17 NOTE — Patient Instructions (Addendum)
After Visit Summary:  We will be checking the following labs today - BMET   Medication Instructions:    Continue with your current medicines.    If you need a refill on your cardiac medications before your next appointment, please call your pharmacy.     Testing/Procedures To Be Arranged:  N/A  Follow-Up:   See me in 6 months    At [facility], you and your health needs are our priority.  As part of our continuing mission to provide you with exceptional heart care, we have created designated Provider Care Teams.  These Care Teams include your primary Cardiologist (physician) and Advanced Practice Providers (APPs -  Physician Assistants and Nurse Practitioners) who all work together to provide you with the care you need, when you need it.  Special Instructions:  . Stay safe, wash your hands for at least 20 seconds and wear a mask when needed.  . It was good to talk with you today.    Call the [place] office at [phone] if you have any questions, problems or concerns.

## 2019-10-18 LAB — BASIC METABOLIC PANEL
BUN/Creatinine Ratio: 11 — ABNORMAL LOW (ref 12–28)
BUN: 11 mg/dL (ref 8–27)
CO2: 29 mmol/L (ref 20–29)
Calcium: 9.6 mg/dL (ref 8.7–10.3)
Chloride: 99 mmol/L (ref 96–106)
Creatinine, Ser: 0.96 mg/dL (ref 0.57–1.00)
GFR calc Af Amer: 66 mL/min/{1.73_m2} (ref >59)
GFR calc non Af Amer: 58 mL/min/{1.73_m2} — ABNORMAL LOW (ref >59)
Glucose: 74 mg/dL (ref 65–99)
Potassium: 3.5 mmol/L (ref 3.5–5.2)
Sodium: 140 mmol/L (ref 134–144)

## 2019-10-24 NOTE — Progress Notes (Signed)
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 BREAST, LEFT ANTERIOR MARGIN, RE-EXCISION:  - Prior procedure site changes in otherwise benign breast tissue.  - No carcinoma identified.  B. BREAST, LEFT INFERIOR MARGIN, RE-EXCISION:  - Prior procedure site changes in otherwise benign breast tissue.  - No carcinoma identified.  C. BREAST, LEFT LATERAL MARGIN, RE-EXCISION:  - Prior procedure site changes in otherwise benign breast tissue.  - No carcinoma identified.  D. BREAST, LEFT MEDIAL MARGIN, RE-EXCISION:  - Prior procedure site changes in otherwise benign breast tissue.  - No carcinoma identified.  E. BREAST, LEFT POSTERIOR MARGIN, RE-EXCISION:  - Prior procedure site changes in otherwise benign breast tissue.  - No carcinoma identified.  F. BREAST, LEFT SUPERIOR MARGIN, RE-EXCISION:  - Prior procedure site changes in otherwise benign breast tissue.  - No carcinoma identified.   09/05/2019 FINAL MICROSCOPIC DIAGNOSIS:  A. BREAST, LEFT SUPERFICIAL, LUMPECTOMY:  - Benign breast tissue.  B. RADIOACTIVE SEED, LEFT BREAST, REMOVAL:  - Gross diagnosis only: Radioactive seed.  C. BREAST, LEFT INFERIOR, LUMPECTOMY:  - Invasive ductal carcinoma, grade 2, spanning 3 cm.  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  - Invasive carcinoma is present at the posterior margin focally (on  ink), inferior margin focally (on ink), anterior-superior margin broadly  (on ink), and anterior margin broadly (on ink).  - In situ carcinoma is <0.1 cm of the anterior-superior, anterior, and  lateral margins focally (not on ink).  - Biopsy site.  - See oncology table.  D. BREAST, LEFT ADDITIONAL SUPERIOR MARGIN, EXCISION:  - Benign breast tissue.  E.  BREAST, RIGHT, LUMPECTOMY:  - Benign breast tissue with dilated ducts.  - No residual carcinoma.  - See oncology table and comment.  F. LYMPH NODE, LEFT AXILLARY, SENTINEL, BIOPSY:  - One of one lymph nodes negative for carcinoma (0/1).  G. LYMPH NODE, LEFT AXILLARY, SENTINEL, BIOPSY:  - One of one lymph nodes negative for carcinoma (0/1).  H. BREAST, RIGHT ADDITIONAL MEDIAL MARGIN, EXCISION:  - Benign breast tissue with hemorrhage and fibrosis.  - Biopsy site.   Receptor Status:  01/31/2019 LEFT: ER(100%), PR (100%), Her2-neu (negative), Ki-67(15%) 03/10/2019 RIGHT: ER(100%), PR (40%), Her2-neu   Did patient present with symptoms (if so, please note symptoms) or was this found on screening mammography?:  Patient palpated a lump in her left breast last year. Diagnostic mammogram and [place] on 01/27/2019 showed a 2.5cm mass at the 6 o'clock position, a 0.7cm mass in the retroareolar left breast, and no abnormal left axillary adenopathy  Past/Anticipated interventions by surgeon, if any: 10/03/2019 Dr. [name] [name] Left Breast Re-excison Lumpectomy 09/05/2019 Dr. [name] [name] Left breast seed lumpectomy x2 with left axillary sentinel lymph node mapping and right breast seed lumpectomy  Past/Anticipated interventions by medical oncology, if any:  Under care of Dr. Nicholas Lose Recommendations: 1.Neoadjuvant antiestrogen therapy anastrozole 1 mg daily 02/17/2019 2.Breast conserving surgery 09/05/2019 3. Adjuvant radiation therapy followed by(patient is also not keen on adjuvant radiation) 4.Continuation of adjuvant antiestrogen therapy  Lymphedema issues, if any:  Patient feels her left breast is significantly larger than her right. She also reports some swelling to her left hand that she says has been present since her first lumpectomy on 09/05/2019. She also states she cannot fully close that hand to make a  fist. She has told Dr. Lindi Adie who believes it may be a side effect from  her anastrozole.     Pain issues, if any:  Patient denies   SAFETY ISSUES:  Prior radiation? No  Pacemaker/ICD? No  Possible current pregnancy? No  Is the patient on methotrexate? No  Current Complaints / other details:  Nothing of note

## 2019-10-25 ENCOUNTER — Other Ambulatory Visit: Payer: Self-pay

## 2019-10-25 ENCOUNTER — Ambulatory Visit
Admission: RE | Admit: 2019-10-25 | Discharge: 2019-10-25 | Disposition: A | Discharge status: Home / Self Care | Payer: Medicare Other | Source: Ambulatory Visit | Attending: Radiation Oncology | Admitting: Radiation Oncology

## 2019-10-25 ENCOUNTER — Ambulatory Visit: Payer: Medicare Other | Admitting: Radiation Oncology

## 2019-10-25 ENCOUNTER — Ambulatory Visit: Payer: Medicare Other

## 2019-10-25 VITALS — BP 165/65 | HR 56 | Temp 98.0°F | Resp 18 | Ht 63.0 in | Wt 141.2 lb

## 2019-10-25 DIAGNOSIS — Z170 Estrogen receptor positive status [ER+]: Secondary | ICD-10-CM

## 2019-10-25 DIAGNOSIS — Z7982 Long term (current) use of aspirin: Secondary | ICD-10-CM | POA: Insufficient documentation

## 2019-10-25 DIAGNOSIS — C50511 Malignant neoplasm of lower-outer quadrant of right female breast: Secondary | ICD-10-CM | POA: Diagnosis not present

## 2019-10-25 DIAGNOSIS — C50111 Malignant neoplasm of central portion of right female breast: Secondary | ICD-10-CM | POA: Diagnosis not present

## 2019-10-25 DIAGNOSIS — Z79899 Other long term (current) drug therapy: Secondary | ICD-10-CM | POA: Diagnosis not present

## 2019-10-25 DIAGNOSIS — C50512 Malignant neoplasm of lower-outer quadrant of left female breast: Secondary | ICD-10-CM | POA: Insufficient documentation

## 2019-10-25 DIAGNOSIS — Z510 Encounter for antineoplastic radiation therapy: Secondary | ICD-10-CM | POA: Insufficient documentation

## 2019-10-25 DIAGNOSIS — Z9889 Other specified postprocedural states: Secondary | ICD-10-CM | POA: Diagnosis not present

## 2019-10-25 NOTE — Progress Notes (Signed)
Radiation Oncology         [phone] ________________________________  Name: Robin Anderson MRN: [id number]  Date: 10/25/2019  DOB: 04-10-1941  Follow-Up Visit Note  Outpatient  CC: Andree Moro, DO  Nicholas Lose, MD  Diagnosis:      ICD-10-CM   1. Malignant neoplasm of lower-outer quadrant of left breast of female, estrogen receptor positive ([place])  C50.512    Z17.0   2. Malignant neoplasm of central portion of right breast in female, estrogen receptor positive ([place])  C50.111    Z17.0      Cancer Staging Malignant neoplasm of central portion of right breast in female, estrogen receptor positive ([place]) Staging form: Breast, AJCC 8th Edition - Pathologic stage from 09/05/2018: No Stage Recommended (ypT0, pN0, cM0) - Signed by Gardenia Phlegm, NP on 09/20/2019 - Clinical stage from 03/10/2019: Stage IA (cT53m, cN0, cM0, G1, ER+, PR+, HER2-) - Signed by CGardenia Phlegm NP on 09/20/2019  Malignant neoplasm of lower-outer quadrant of left breast of female, estrogen receptor positive ([place] Staging form: Breast, AJCC 8th Edition - Clinical stage from 02/17/2019: Stage IB (cT2, cN0, cM0, G2, ER+, PR+, HER2-) - Signed by GNicholas Lose MD on 02/17/2019 - Pathologic stage from 09/05/2019: No Stage Recommended (ypT2, pN0, cM0, G2, ER+, PR+, HER2-) - Signed by CGardenia Phlegm NP on 09/20/2019   CHIEF COMPLAINT: Here to discuss 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 I last saw her on 09/26/2019 to discuss radiation therapy.     Since her last visit, she underwent re-excision of the positive left breast margins on 10/03/2019. Pathology from the procedure was negative for carcinoma.  Symptomatically, the patient reports: no pain. She is doing well.  SAFETY ISSUES:  Prior radiation? No  Pacemaker/ICD? No  Possible current pregnancy? No  Is the patient on  methotrexate? No         ALLERGIES:  is allergic to penicillins.  Meds: Current Outpatient Medications  Medication Sig Dispense Refill  . anastrozole (ARIMIDEX) 1 MG tablet Take 1 tablet (1 mg total) by mouth daily. 90 tablet 3  . aspirin EC 81 MG tablet Take 81 mg by mouth daily.    . benazepril-hydrochlorthiazide (LOTENSIN HCT) 20-25 MG tablet Take 1 tablet by mouth daily. 90 tablet 3  . calcium carbonate (CALCIUM 600) 600 MG TABS tablet Take 600 mg by mouth daily.    . Cholecalciferol (VITAMIN D3) 50 MCG (2000 UT) TABS Take 2,000 Units by mouth daily.    . primidone (MYSOLINE) 50 MG tablet TAKE 3 TABLETS BY MOUTH AT BEDTIME 270 tablet 3  . simvastatin (ZOCOR) 20 MG tablet TAKE 1 TABLET BY MOUTH EVERYDAY AT BEDTIME 90 tablet 2   No current facility-administered medications for this encounter.    Physical Findings:  height is '5\' 3"'$  (1.6 m) and weight is 141 lb 4 oz (64.1 kg). Her oral temperature is 98 F (36.7 C). Her blood pressure is 165/65 (abnormal) and her pulse is 56 (abnormal). Her respiration is 18 and oxygen saturation is 100%. .     General: Alert and oriented, in no acute distress Psychiatric: Judgment and insight are intact. Affect is appropriate. Breast exam reveals excellent healing of lumpectomy scars bilaterally, excellent healing of axillary scar on left.  She has some postoperative swelling in the left breast.   Lab Findings: Lab Results  Component Value Date   WBC 6.5  10/03/2019   HGB 12.4 10/03/2019   HCT 38.9 10/03/2019   MCV 88.4 10/03/2019   PLT 220 10/03/2019    Radiographic Findings: No results found.  Impression/Plan: Bilateral breast cancer  We again discussed adjuvant radiotherapy today.  I recommend 4 weeks directed at the left breast and 3 weeks of radiotherapy to the right breast in order to reduce the risk of locoregional recurrences by 2/3.  The risks, benefits and side effects of this treatment were discussed in detail.  She understands  that radiotherapy is associated with skin irritation and fatigue in the acute setting. Late effects can include cosmetic changes and rare injury to internal organs.  She is enthusiastic about proceeding with treatment.   Simulation will proceed today and we will plan on starting treatment next week.  On date of service, in total, I spent 20 minutes on this encounter.  She was seen in person. _____________________________________   Eppie Gibson, MD   This document serves as a record of services personally performed by Eppie Gibson, MD. It was created on her behalf by Wilburn Mylar, a trained medical scribe. The creation of this record is based on the scribe's personal observations and the provider's statements to them. This document has been checked and approved by the attending provider.

## 2019-10-26 ENCOUNTER — Encounter: Payer: Self-pay | Admitting: *Deleted

## 2019-10-27 ENCOUNTER — Encounter: Payer: Self-pay | Admitting: Radiation Oncology

## 2019-10-27 ENCOUNTER — Telehealth: Payer: Self-pay | Admitting: Hematology and Oncology

## 2019-10-27 DIAGNOSIS — C50111 Malignant neoplasm of central portion of right female breast: Secondary | ICD-10-CM | POA: Diagnosis not present

## 2019-10-27 DIAGNOSIS — C50512 Malignant neoplasm of lower-outer quadrant of left female breast: Secondary | ICD-10-CM | POA: Diagnosis not present

## 2019-10-27 DIAGNOSIS — Z510 Encounter for antineoplastic radiation therapy: Secondary | ICD-10-CM | POA: Diagnosis not present

## 2019-10-27 DIAGNOSIS — C50511 Malignant neoplasm of lower-outer quadrant of right female breast: Secondary | ICD-10-CM | POA: Diagnosis not present

## 2019-10-27 NOTE — Telephone Encounter (Signed)
Scheduled appt per 7/8 sch msg - mailed reminder letter with appt date and time

## 2019-10-31 ENCOUNTER — Ambulatory Visit: Payer: Medicare Other | Admitting: Radiation Oncology

## 2019-11-01 ENCOUNTER — Ambulatory Visit: Payer: Medicare Other

## 2019-11-02 ENCOUNTER — Ambulatory Visit: Payer: Medicare Other

## 2019-11-03 ENCOUNTER — Encounter: Payer: Self-pay | Admitting: *Deleted

## 2019-11-03 ENCOUNTER — Ambulatory Visit: Payer: Medicare Other

## 2019-11-06 ENCOUNTER — Ambulatory Visit: Payer: Medicare Other

## 2019-11-06 ENCOUNTER — Ambulatory Visit
Admission: RE | Admit: 2019-11-06 | Discharge: 2019-11-06 | Disposition: A | Discharge status: Home / Self Care | Payer: Medicare Other | Source: Ambulatory Visit | Attending: Radiation Oncology | Admitting: Radiation Oncology

## 2019-11-06 ENCOUNTER — Other Ambulatory Visit: Payer: Self-pay

## 2019-11-06 DIAGNOSIS — Z510 Encounter for antineoplastic radiation therapy: Secondary | ICD-10-CM | POA: Diagnosis not present

## 2019-11-06 DIAGNOSIS — C50111 Malignant neoplasm of central portion of right female breast: Secondary | ICD-10-CM | POA: Diagnosis not present

## 2019-11-06 DIAGNOSIS — C50512 Malignant neoplasm of lower-outer quadrant of left female breast: Secondary | ICD-10-CM | POA: Diagnosis not present

## 2019-11-06 DIAGNOSIS — C50511 Malignant neoplasm of lower-outer quadrant of right female breast: Secondary | ICD-10-CM | POA: Diagnosis not present

## 2019-11-07 ENCOUNTER — Ambulatory Visit
Admission: RE | Admit: 2019-11-07 | Discharge: 2019-11-07 | Disposition: A | Discharge status: Home / Self Care | Payer: Medicare Other | Source: Ambulatory Visit | Attending: Radiation Oncology | Admitting: Radiation Oncology

## 2019-11-07 ENCOUNTER — Other Ambulatory Visit: Payer: Self-pay | Admitting: *Deleted

## 2019-11-07 ENCOUNTER — Other Ambulatory Visit: Payer: Self-pay

## 2019-11-07 DIAGNOSIS — Z170 Estrogen receptor positive status [ER+]: Secondary | ICD-10-CM

## 2019-11-07 DIAGNOSIS — Z510 Encounter for antineoplastic radiation therapy: Secondary | ICD-10-CM | POA: Diagnosis not present

## 2019-11-07 DIAGNOSIS — C50512 Malignant neoplasm of lower-outer quadrant of left female breast: Secondary | ICD-10-CM

## 2019-11-07 DIAGNOSIS — C50111 Malignant neoplasm of central portion of right female breast: Secondary | ICD-10-CM | POA: Diagnosis not present

## 2019-11-07 DIAGNOSIS — C50511 Malignant neoplasm of lower-outer quadrant of right female breast: Secondary | ICD-10-CM | POA: Diagnosis not present

## 2019-11-07 MED ORDER — SONAFINE EX EMUL
1.0000 "application " | Freq: Two times a day (BID) | CUTANEOUS | Status: DC
  Administered 2019-11-07: 1 via TOPICAL

## 2019-11-07 MED ORDER — ALRA NON-METALLIC DEODORANT (RAD-ONC)
1.0000 "application " | Freq: Once | TOPICAL | Status: AC
  Administered 2019-11-07: 1 via TOPICAL

## 2019-11-07 NOTE — Progress Notes (Signed)
Pt here for patient teaching.  Pt given Radiation and You booklet, skin care instructions, [place] deodorant and Sonafine.  Reviewed areas of pertinence such as fatigue, hair loss, skin changes, breast tenderness and breast swelling . Pt able to give teach back of to pat skin and use unscented/gentle soap,apply Sonafine bid, avoid applying anything to skin within 4 hours of treatment, avoid wearing an under wire bra and to use an electric razor if they must shave. Pt demonstrated understanding of information given and will contact nursing with any questions or concerns.     Http://[email or web address]/treatmentinformation/whattoexpect/index

## 2019-11-08 ENCOUNTER — Other Ambulatory Visit: Payer: Self-pay

## 2019-11-08 ENCOUNTER — Ambulatory Visit
Admission: RE | Admit: 2019-11-08 | Discharge: 2019-11-08 | Disposition: A | Discharge status: Home / Self Care | Payer: Medicare Other | Source: Ambulatory Visit | Attending: Radiation Oncology | Admitting: Radiation Oncology

## 2019-11-08 DIAGNOSIS — C50512 Malignant neoplasm of lower-outer quadrant of left female breast: Secondary | ICD-10-CM | POA: Diagnosis not present

## 2019-11-08 DIAGNOSIS — Z510 Encounter for antineoplastic radiation therapy: Secondary | ICD-10-CM | POA: Diagnosis not present

## 2019-11-08 DIAGNOSIS — C50511 Malignant neoplasm of lower-outer quadrant of right female breast: Secondary | ICD-10-CM | POA: Diagnosis not present

## 2019-11-08 DIAGNOSIS — C50111 Malignant neoplasm of central portion of right female breast: Secondary | ICD-10-CM | POA: Diagnosis not present

## 2019-11-09 ENCOUNTER — Other Ambulatory Visit: Payer: Self-pay

## 2019-11-09 ENCOUNTER — Ambulatory Visit
Admission: RE | Admit: 2019-11-09 | Discharge: 2019-11-09 | Disposition: A | Discharge status: Home / Self Care | Payer: Medicare Other | Source: Ambulatory Visit | Attending: Radiation Oncology | Admitting: Radiation Oncology

## 2019-11-09 DIAGNOSIS — C50512 Malignant neoplasm of lower-outer quadrant of left female breast: Secondary | ICD-10-CM | POA: Diagnosis not present

## 2019-11-09 DIAGNOSIS — C50511 Malignant neoplasm of lower-outer quadrant of right female breast: Secondary | ICD-10-CM | POA: Diagnosis not present

## 2019-11-09 DIAGNOSIS — Z510 Encounter for antineoplastic radiation therapy: Secondary | ICD-10-CM | POA: Diagnosis not present

## 2019-11-09 DIAGNOSIS — C50111 Malignant neoplasm of central portion of right female breast: Secondary | ICD-10-CM | POA: Diagnosis not present

## 2019-11-10 ENCOUNTER — Ambulatory Visit
Admission: RE | Admit: 2019-11-10 | Discharge: 2019-11-10 | Disposition: A | Discharge status: Home / Self Care | Payer: Medicare Other | Source: Ambulatory Visit | Attending: Radiation Oncology | Admitting: Radiation Oncology

## 2019-11-10 DIAGNOSIS — C50511 Malignant neoplasm of lower-outer quadrant of right female breast: Secondary | ICD-10-CM | POA: Diagnosis not present

## 2019-11-10 DIAGNOSIS — C50111 Malignant neoplasm of central portion of right female breast: Secondary | ICD-10-CM | POA: Diagnosis not present

## 2019-11-10 DIAGNOSIS — C50512 Malignant neoplasm of lower-outer quadrant of left female breast: Secondary | ICD-10-CM | POA: Diagnosis not present

## 2019-11-10 DIAGNOSIS — Z510 Encounter for antineoplastic radiation therapy: Secondary | ICD-10-CM | POA: Diagnosis not present

## 2019-11-13 ENCOUNTER — Other Ambulatory Visit: Payer: Self-pay

## 2019-11-13 ENCOUNTER — Ambulatory Visit
Admission: RE | Admit: 2019-11-13 | Discharge: 2019-11-13 | Disposition: A | Discharge status: Home / Self Care | Payer: Medicare Other | Source: Ambulatory Visit | Attending: Radiation Oncology | Admitting: Radiation Oncology

## 2019-11-13 DIAGNOSIS — C50511 Malignant neoplasm of lower-outer quadrant of right female breast: Secondary | ICD-10-CM | POA: Diagnosis not present

## 2019-11-13 DIAGNOSIS — C50512 Malignant neoplasm of lower-outer quadrant of left female breast: Secondary | ICD-10-CM | POA: Diagnosis not present

## 2019-11-13 DIAGNOSIS — C50111 Malignant neoplasm of central portion of right female breast: Secondary | ICD-10-CM | POA: Diagnosis not present

## 2019-11-13 DIAGNOSIS — Z510 Encounter for antineoplastic radiation therapy: Secondary | ICD-10-CM | POA: Diagnosis not present

## 2019-11-14 ENCOUNTER — Ambulatory Visit
Admission: RE | Admit: 2019-11-14 | Discharge: 2019-11-14 | Disposition: A | Discharge status: Home / Self Care | Payer: Medicare Other | Source: Ambulatory Visit | Attending: Radiation Oncology | Admitting: Radiation Oncology

## 2019-11-14 ENCOUNTER — Other Ambulatory Visit: Payer: Self-pay

## 2019-11-14 DIAGNOSIS — C50512 Malignant neoplasm of lower-outer quadrant of left female breast: Secondary | ICD-10-CM | POA: Diagnosis not present

## 2019-11-14 DIAGNOSIS — C50511 Malignant neoplasm of lower-outer quadrant of right female breast: Secondary | ICD-10-CM | POA: Diagnosis not present

## 2019-11-14 DIAGNOSIS — C50111 Malignant neoplasm of central portion of right female breast: Secondary | ICD-10-CM | POA: Diagnosis not present

## 2019-11-14 DIAGNOSIS — Z510 Encounter for antineoplastic radiation therapy: Secondary | ICD-10-CM | POA: Diagnosis not present

## 2019-11-15 ENCOUNTER — Other Ambulatory Visit: Payer: Self-pay

## 2019-11-15 ENCOUNTER — Ambulatory Visit
Admission: RE | Admit: 2019-11-15 | Discharge: 2019-11-15 | Disposition: A | Discharge status: Home / Self Care | Payer: Medicare Other | Source: Ambulatory Visit | Attending: Radiation Oncology | Admitting: Radiation Oncology

## 2019-11-15 DIAGNOSIS — Z510 Encounter for antineoplastic radiation therapy: Secondary | ICD-10-CM | POA: Diagnosis not present

## 2019-11-15 DIAGNOSIS — C50511 Malignant neoplasm of lower-outer quadrant of right female breast: Secondary | ICD-10-CM | POA: Diagnosis not present

## 2019-11-15 DIAGNOSIS — C50111 Malignant neoplasm of central portion of right female breast: Secondary | ICD-10-CM | POA: Diagnosis not present

## 2019-11-15 DIAGNOSIS — C50512 Malignant neoplasm of lower-outer quadrant of left female breast: Secondary | ICD-10-CM | POA: Diagnosis not present

## 2019-11-16 ENCOUNTER — Ambulatory Visit
Admission: RE | Admit: 2019-11-16 | Discharge: 2019-11-16 | Disposition: A | Discharge status: Home / Self Care | Payer: Medicare Other | Source: Ambulatory Visit | Attending: Radiation Oncology | Admitting: Radiation Oncology

## 2019-11-16 ENCOUNTER — Other Ambulatory Visit: Payer: Self-pay

## 2019-11-16 DIAGNOSIS — C50511 Malignant neoplasm of lower-outer quadrant of right female breast: Secondary | ICD-10-CM | POA: Diagnosis not present

## 2019-11-16 DIAGNOSIS — Z510 Encounter for antineoplastic radiation therapy: Secondary | ICD-10-CM | POA: Diagnosis not present

## 2019-11-16 DIAGNOSIS — C50512 Malignant neoplasm of lower-outer quadrant of left female breast: Secondary | ICD-10-CM | POA: Diagnosis not present

## 2019-11-16 DIAGNOSIS — C50111 Malignant neoplasm of central portion of right female breast: Secondary | ICD-10-CM | POA: Diagnosis not present

## 2019-11-17 ENCOUNTER — Ambulatory Visit
Admission: RE | Admit: 2019-11-17 | Discharge: 2019-11-17 | Disposition: A | Discharge status: Home / Self Care | Payer: Medicare Other | Source: Ambulatory Visit | Attending: Radiation Oncology | Admitting: Radiation Oncology

## 2019-11-17 ENCOUNTER — Other Ambulatory Visit: Payer: Self-pay

## 2019-11-17 DIAGNOSIS — Z510 Encounter for antineoplastic radiation therapy: Secondary | ICD-10-CM | POA: Diagnosis not present

## 2019-11-17 DIAGNOSIS — C50511 Malignant neoplasm of lower-outer quadrant of right female breast: Secondary | ICD-10-CM | POA: Diagnosis not present

## 2019-11-17 DIAGNOSIS — C50111 Malignant neoplasm of central portion of right female breast: Secondary | ICD-10-CM | POA: Diagnosis not present

## 2019-11-17 DIAGNOSIS — C50512 Malignant neoplasm of lower-outer quadrant of left female breast: Secondary | ICD-10-CM | POA: Diagnosis not present

## 2019-11-20 ENCOUNTER — Ambulatory Visit
Admission: RE | Admit: 2019-11-20 | Discharge: 2019-11-20 | Disposition: A | Discharge status: Home / Self Care | Payer: Medicare Other | Source: Ambulatory Visit | Attending: Radiation Oncology | Admitting: Radiation Oncology

## 2019-11-20 ENCOUNTER — Other Ambulatory Visit: Payer: Self-pay

## 2019-11-20 DIAGNOSIS — C50511 Malignant neoplasm of lower-outer quadrant of right female breast: Secondary | ICD-10-CM | POA: Insufficient documentation

## 2019-11-20 DIAGNOSIS — C50512 Malignant neoplasm of lower-outer quadrant of left female breast: Secondary | ICD-10-CM | POA: Diagnosis not present

## 2019-11-20 DIAGNOSIS — C50111 Malignant neoplasm of central portion of right female breast: Secondary | ICD-10-CM | POA: Diagnosis not present

## 2019-11-20 DIAGNOSIS — Z510 Encounter for antineoplastic radiation therapy: Secondary | ICD-10-CM | POA: Diagnosis not present

## 2019-11-21 ENCOUNTER — Ambulatory Visit: Payer: Medicare Other

## 2019-11-21 ENCOUNTER — Ambulatory Visit
Admission: RE | Admit: 2019-11-21 | Discharge: 2019-11-21 | Disposition: A | Discharge status: Home / Self Care | Payer: Medicare Other | Source: Ambulatory Visit | Attending: Radiation Oncology | Admitting: Radiation Oncology

## 2019-11-21 ENCOUNTER — Other Ambulatory Visit: Payer: Self-pay

## 2019-11-21 DIAGNOSIS — C50511 Malignant neoplasm of lower-outer quadrant of right female breast: Secondary | ICD-10-CM | POA: Diagnosis not present

## 2019-11-21 DIAGNOSIS — Z510 Encounter for antineoplastic radiation therapy: Secondary | ICD-10-CM | POA: Diagnosis not present

## 2019-11-21 DIAGNOSIS — C50512 Malignant neoplasm of lower-outer quadrant of left female breast: Secondary | ICD-10-CM | POA: Diagnosis not present

## 2019-11-21 DIAGNOSIS — C50111 Malignant neoplasm of central portion of right female breast: Secondary | ICD-10-CM | POA: Diagnosis not present

## 2019-11-22 ENCOUNTER — Ambulatory Visit: Payer: Medicare Other

## 2019-11-22 ENCOUNTER — Other Ambulatory Visit: Payer: Self-pay

## 2019-11-22 ENCOUNTER — Ambulatory Visit
Admission: RE | Admit: 2019-11-22 | Discharge: 2019-11-22 | Disposition: A | Discharge status: Home / Self Care | Payer: Medicare Other | Source: Ambulatory Visit | Attending: Radiation Oncology | Admitting: Radiation Oncology

## 2019-11-22 DIAGNOSIS — Z510 Encounter for antineoplastic radiation therapy: Secondary | ICD-10-CM | POA: Diagnosis not present

## 2019-11-22 DIAGNOSIS — C50512 Malignant neoplasm of lower-outer quadrant of left female breast: Secondary | ICD-10-CM | POA: Diagnosis not present

## 2019-11-22 DIAGNOSIS — C50111 Malignant neoplasm of central portion of right female breast: Secondary | ICD-10-CM | POA: Diagnosis not present

## 2019-11-22 DIAGNOSIS — C50511 Malignant neoplasm of lower-outer quadrant of right female breast: Secondary | ICD-10-CM | POA: Diagnosis not present

## 2019-11-23 ENCOUNTER — Other Ambulatory Visit: Payer: Self-pay

## 2019-11-23 ENCOUNTER — Encounter: Payer: Self-pay | Admitting: Physical Therapy

## 2019-11-23 ENCOUNTER — Ambulatory Visit
Admission: RE | Admit: 2019-11-23 | Discharge: 2019-11-23 | Disposition: A | Discharge status: Home / Self Care | Payer: Medicare Other | Source: Ambulatory Visit | Attending: Radiation Oncology | Admitting: Radiation Oncology

## 2019-11-23 ENCOUNTER — Ambulatory Visit: Payer: Medicare Other

## 2019-11-23 ENCOUNTER — Ambulatory Visit: Payer: Medicare Other | Attending: Hematology and Oncology | Admitting: Physical Therapy

## 2019-11-23 DIAGNOSIS — M25611 Stiffness of right shoulder, not elsewhere classified: Secondary | ICD-10-CM | POA: Diagnosis not present

## 2019-11-23 DIAGNOSIS — Z510 Encounter for antineoplastic radiation therapy: Secondary | ICD-10-CM | POA: Diagnosis not present

## 2019-11-23 DIAGNOSIS — M25612 Stiffness of left shoulder, not elsewhere classified: Secondary | ICD-10-CM | POA: Diagnosis not present

## 2019-11-23 DIAGNOSIS — C50512 Malignant neoplasm of lower-outer quadrant of left female breast: Secondary | ICD-10-CM | POA: Diagnosis not present

## 2019-11-23 DIAGNOSIS — I890 Lymphedema, not elsewhere classified: Secondary | ICD-10-CM | POA: Diagnosis not present

## 2019-11-23 DIAGNOSIS — Z170 Estrogen receptor positive status [ER+]: Secondary | ICD-10-CM | POA: Insufficient documentation

## 2019-11-23 DIAGNOSIS — C50511 Malignant neoplasm of lower-outer quadrant of right female breast: Secondary | ICD-10-CM | POA: Insufficient documentation

## 2019-11-23 DIAGNOSIS — R293 Abnormal posture: Secondary | ICD-10-CM

## 2019-11-23 DIAGNOSIS — C50111 Malignant neoplasm of central portion of right female breast: Secondary | ICD-10-CM | POA: Diagnosis not present

## 2019-11-23 NOTE — Therapy (Signed)
[place], [place], [address] Phone: [phone]   Fax:  [phone]  Physical Therapy Evaluation  Patient Details  Name: Robin Anderson MRN: [id number] Date of Birth: 02/03/1941 Referring Provider (PT): Lindi Adie   Encounter Date: 11/23/2019   PT End of Session - 11/23/19 1400    Visit Number 1    Number of Visits 9    Date for PT Re-Evaluation 12/21/19    PT Start Time 6979    PT Stop Time 1348    PT Time Calculation (min) 50 min    Activity Tolerance Patient tolerated treatment well    Behavior During Therapy [facility] for tasks assessed/performed           Past Medical History:  Diagnosis Date  . Blood in stool   . Cancer [facility])    breast - left  . Cataracts, bilateral   . Chest pain, unspecified   . Family history of breast cancer   . Headache 07/02/2014  . Heart murmur   . Hepatitis   . History of colon polyps   . HLD (hyperlipidemia)   . HTN (hypertension)   . Prediabetes 07/28/2011   no meds, diet controlled  . Seizure disorder ([place])    seizures controlled with med  . Wears glasses     Past Surgical History:  Procedure Laterality Date  . ABDOMINAL HERNIA REPAIR     lower mid line hernia which has reherniatied  . BREAST BIOPSY Left 01/31/2019   Malignant  . BREAST LUMPECTOMY WITH RADIOACTIVE SEED AND SENTINEL LYMPH NODE BIOPSY N/A 09/05/2019   Procedure: RIGHT BREAST SEED LUMPECTOMY X 1, LEFT BREAST SEED LUMPECTOMY X 2, LEFT AXILLARY SENTINEL LYMPH NODE BIOPSY;  Surgeon: Erroll Luna, MD;  Location: [place];  Service: General;  Laterality: N/A;  . BUNIONECTOMY Right    traumatic injury to her foot that was repaired  . RE-EXCISION OF BREAST LUMPECTOMY Left 10/03/2019   Procedure: LEFT BREAST RE-EXCISION OF BREAST LUMPECTOMY;  Surgeon: Erroll Luna, MD;  Location: [place];  Service: General;  Laterality: Left;  . TONSILLECTOMY    . TUBAL LIGATION      There were no vitals filed  for this visit.    Subjective Assessment - 11/23/19 1303    Subjective I have been having swelling in my left arm and breast since the surgery. Both the arm and breast are swollen and stay hot. I had radiation this morning. I have the rest of next week before I finish.    Patient Stated Goals to get the pain and swelling gone and be able to use this hand    Currently in Pain? Yes    Pain Score 9     Pain Location Finger (Comment which one)    Pain Orientation Left    Pain Descriptors / Indicators Tightness;Aching;Heaviness    Pain Type Chronic pain    Pain Onset More than a month ago    Pain Frequency Constant    Aggravating Factors  nothing    Pain Relieving Factors nothing    Effect of Pain on Daily Activities hard to use her hand due to swelling in fingers, she can close her hand              [facility] PT Assessment - 11/23/19 0001      Assessment   Medical Diagnosis L breast cancer    Referring Provider (PT) Lindi Adie    Onset Date/Surgical Date 09/05/19  Hand Dominance Right    Prior Therapy none      Precautions   Precautions Other (comment)    Precaution Comments at risk of lymphedema      Restrictions   Weight Bearing Restrictions No      Balance Screen   Has the patient fallen in the past 6 months No    Has the patient had a decrease in activity level because of a fear of falling?  No    Is the patient reluctant to leave their home because of a fear of falling?  No      Home Ecologist residence    Living Arrangements Spouse/significant other    Available Help at Discharge Family    Type of [place] to enter    Entrance Stairs-Number of Steps 3    [place] One level      Prior Function   Level of Independence Independent    Vocation Retired    Leisure pt has ball that she squeezes, does seated exercises      Cognition   Overall Cognitive Status Within Functional  Limits for tasks assessed      Observation/Other Assessments   Observations Swelling visible in left fingers- less wrinkles noted, pt unable to bend fingers, left breast larger than right especially in upper breast- possible seroma present due to hard "ball like" texture in this area      Posture/Postural Control   Posture/Postural Control Postural limitations    Postural Limitations Rounded Shoulders;Forward head      ROM / Strength   AROM / PROM / Strength AROM      AROM   AROM Assessment Site Shoulder    Right/Left Shoulder Left;Right    Right Shoulder Flexion 142 Degrees    Right Shoulder ABduction 143 Degrees    Right Shoulder Internal Rotation 53 Degrees    Right Shoulder External Rotation 89 Degrees    Left Shoulder Flexion 137 Degrees    Left Shoulder ABduction 126 Degrees    Left Shoulder Internal Rotation 47 Degrees    Left Shoulder External Rotation 78 Degrees             LYMPHEDEMA/ONCOLOGY QUESTIONNAIRE - 11/23/19 0001      Type   Cancer Type left breast cancer      Surgeries   Lumpectomy Date 09/05/19    Other Surgery Date 10/03/19    Number Lymph Nodes Removed 2   both nodes negative     Date Lymphedema/Swelling Started   Date 09/05/19      Treatment   Active Chemotherapy Treatment No    Past Chemotherapy Treatment No    Active Radiation Treatment Yes    Past Radiation Treatment No    Current Hormone Treatment Yes    Drug Name anastrozole      What other symptoms do you have   Are you Having Heaviness or Tightness Yes    Are you having Pain Yes    Are you having pitting edema No    Is it Hard or Difficult finding clothes that fit No    Do you have infections No    Is there Decreased scar mobility Yes      Lymphedema Assessments   Lymphedema Assessments Upper extremities      Right Upper Extremity Lymphedema   15 cm Proximal to Olecranon Process 27.5 cm    10  cm Proximal to Olecranon Process 26 cm    Olecranon Process 23 cm    15 cm  Proximal to Ulnar Styloid Process 21 cm    10 cm Proximal to Ulnar Styloid Process 17.3 cm    Just Proximal to Ulnar Styloid Process 14.5 cm    Across Hand at [employer] 18 cm    At [place] of 2nd Digit 5.8 cm      Left Upper Extremity Lymphedema   15 cm Proximal to Olecranon Process 27.5 cm    10 cm Proximal to Olecranon Process 26.5 cm    Olecranon Process 22.9 cm    15 cm Proximal to Ulnar Styloid Process 20.7 cm    10 cm Proximal to Ulnar Styloid Process 16 cm    Just Proximal to Ulnar Styloid Process 14.5 cm    Across Hand at [employer] 17.5 cm    At [place] of 2nd Digit 6 cm                   Objective measurements completed on examination: See above findings.       [place] Adult PT Treatment/Exercise - 11/23/19 0001      Manual Therapy   Manual Therapy Edema management    Edema Management created foam chip pack for pt to wear in her bra over area of swelling, wrapped digits 1-5 of L hand with elastomul then covered with paper tape and place thick stockinette over hand to protect elastomull                  PT Education - 11/23/19 1359    Education Details anatomy and physiology of lymphatic system, signs of lymphedema, use of chip pack and compression bra and bandaging for management of swelling    Person(s) Educated Patient    Methods Explanation;Handout    Comprehension Verbalized understanding               PT Long Term Goals - 11/23/19 1356      PT LONG TERM GOAL #1   Title Pt will demonstrate 155 degrees of bilateral shoulder flexion to allow her to reach overhead    Baseline R 142, L 137    Time 4    Period Weeks    Status New    Target Date 12/21/19      PT LONG TERM GOAL #2   Title Pt will demonstrate 155 degrees of bilateral shoulder abduction to allow pt to reach out to the side    Baseline R 143 L 126    Time 4    Period Weeks    Status New    Target Date 12/21/19      PT LONG TERM GOAL #3   Title Pt will report a 50%  improvement in left breast swelling to allow improved comfort    Time 4    Period Weeks    Status New    Target Date 12/21/19      PT LONG TERM GOAL #4   Title Pt will report she is able to close her left fist without trouble due to decreased swelling to allow improved comfort    Baseline unable to close hand in a fist    Time 4    Period Weeks    Status New    Target Date 12/21/19      PT LONG TERM GOAL #5   Title Pt will obtain appropriate compression garments for long term management  of LUE and L breast lymphedema to decrease risk of cellulitis    Time 4    Period Weeks    Status New    Target Date 12/21/19      Additional Long Term Goals   Additional Long Term Goals Yes      PT LONG TERM GOAL #6   Title Pt will be independent in a home exercise program for long term managment of lymphedema    Time 4    Period Weeks    Status New    Target Date 12/21/19                  Plan - 11/23/19 1350    Clinical Impression Statement Pt presents to PT following bilateral lumpectomies for treatment of L breast cancer (R was benign). Pt has a SLNB on the L with 2 nodes removed both negative. Pt reports that since her surgery on 09/05/19 she has had swelling in the LUE that fluctuates and L breast swelling. She has decreased bilateral shoulder ROM with left worse than right. Pt has a hard area of swelling in superior left breast that feels like it could be a seroma. She is currently undergoing radiation and will be complete by the end of next week. Pt's left fingers are visibly swollen and pt reports it is hard for her to close her hand in to a fist due to the swelling. Pt would benefit from skilled PT services to increase bilateral shoulder ROM and strength, decrease L UE and hand swelling and decrease L breast swelling and progress pt towards independence with a home exercise program.    Personal Factors and Comorbidities Time since onset of injury/illness/exacerbation     Examination-Activity Limitations Lift;Dressing;Carry;Reach Overhead    Examination-Participation Restrictions Cleaning;Yard Work;Community Activity    Stability/Clinical Decision Making Evolving/Moderate complexity    Clinical Decision Making Moderate    Rehab Potential Good    PT Frequency 2x / week    PT Duration 4 weeks    PT Treatment/Interventions ADLs/Self Care Home Management;Therapeutic exercise;Patient/family education;Manual lymph drainage;Compression bandaging;Manual techniques;Scar mobilization;Passive range of motion;Vasopneumatic Device    PT Next Visit Plan instruct in lymphedema risk reduction practices, begin PROM to bilateral shoulders progressing to pulleys and ball, MLD to L breast and LUE, see if finger bandages helped, see if chip pack helped- if bandages helped then assist with obtaining compression sleeve and glove    PT Home Exercise Plan wear chip pack in bra    Recommended Other Services issued script and info to obtain compression bra    Consulted and Agree with Plan of Care Patient           Patient will benefit from skilled therapeutic intervention in order to improve the following deficits and impairments:  Decreased range of motion, Decreased scar mobility, Increased edema, Decreased knowledge of precautions, Decreased strength, Increased fascial restricitons, Impaired UE functional use, Postural dysfunction, Pain  Visit Diagnosis: Lymphedema, not elsewhere classified  Stiffness of left shoulder, not elsewhere classified  Stiffness of right shoulder, not elsewhere classified  Abnormal posture  Malignant neoplasm of lower-outer quadrant of right breast of female, estrogen receptor positive ([place])     Problem List Patient Active Problem List   Diagnosis Date Noted  . Malignant neoplasm of central portion of right breast in female, estrogen receptor positive ([place]) 09/20/2019  . Family history of breast cancer   . Malignant neoplasm of lower-outer  quadrant of left breast of female, estrogen receptor positive (  [place]) 02/17/2019  . Tremor 06/06/2018  . Headache 07/02/2014  . Tonic clonic seizures ([place]) 08/03/2013  . Glucose intolerance (pre-diabetes) 07/29/2011  . HYPOKALEMIA 11/26/2008  . HYPERLIPIDEMIA-MIXED 11/17/2008  . HYPERTENSION, UNSPECIFIED 11/17/2008  . CHEST PAIN-UNSPECIFIED 11/17/2008    Allyson Sabal [facility] 11/23/2019, 2:01 PM  [place], [place], [address] Phone: [phone]   Fax:  [phone]  Name: Robin Anderson MRN: [id number] Date of Birth: Sep 08, 1940  Allyson Sabal [place], PT 11/23/19 2:02 PM

## 2019-11-24 ENCOUNTER — Other Ambulatory Visit: Payer: Self-pay

## 2019-11-24 ENCOUNTER — Ambulatory Visit
Admission: RE | Admit: 2019-11-24 | Discharge: 2019-11-24 | Disposition: A | Discharge status: Home / Self Care | Payer: Medicare Other | Source: Ambulatory Visit | Attending: Radiation Oncology | Admitting: Radiation Oncology

## 2019-11-24 ENCOUNTER — Ambulatory Visit: Payer: Medicare Other

## 2019-11-24 DIAGNOSIS — C50111 Malignant neoplasm of central portion of right female breast: Secondary | ICD-10-CM | POA: Diagnosis not present

## 2019-11-24 DIAGNOSIS — C50511 Malignant neoplasm of lower-outer quadrant of right female breast: Secondary | ICD-10-CM | POA: Diagnosis not present

## 2019-11-24 DIAGNOSIS — Z510 Encounter for antineoplastic radiation therapy: Secondary | ICD-10-CM | POA: Diagnosis not present

## 2019-11-24 DIAGNOSIS — C50512 Malignant neoplasm of lower-outer quadrant of left female breast: Secondary | ICD-10-CM | POA: Diagnosis not present

## 2019-11-27 ENCOUNTER — Ambulatory Visit: Payer: Medicare Other

## 2019-11-27 ENCOUNTER — Ambulatory Visit
Admission: RE | Admit: 2019-11-27 | Discharge: 2019-11-27 | Disposition: A | Discharge status: Home / Self Care | Payer: Medicare Other | Source: Ambulatory Visit | Attending: Radiation Oncology | Admitting: Radiation Oncology

## 2019-11-27 ENCOUNTER — Other Ambulatory Visit: Payer: Self-pay

## 2019-11-27 DIAGNOSIS — C50512 Malignant neoplasm of lower-outer quadrant of left female breast: Secondary | ICD-10-CM | POA: Diagnosis not present

## 2019-11-27 DIAGNOSIS — C50511 Malignant neoplasm of lower-outer quadrant of right female breast: Secondary | ICD-10-CM | POA: Diagnosis not present

## 2019-11-27 DIAGNOSIS — C50111 Malignant neoplasm of central portion of right female breast: Secondary | ICD-10-CM | POA: Diagnosis not present

## 2019-11-27 DIAGNOSIS — Z510 Encounter for antineoplastic radiation therapy: Secondary | ICD-10-CM | POA: Diagnosis not present

## 2019-11-28 ENCOUNTER — Telehealth: Payer: Self-pay

## 2019-11-28 ENCOUNTER — Ambulatory Visit
Admission: RE | Admit: 2019-11-28 | Discharge: 2019-11-28 | Disposition: A | Discharge status: Home / Self Care | Payer: Medicare Other | Source: Ambulatory Visit | Attending: Radiation Oncology | Admitting: Radiation Oncology

## 2019-11-28 ENCOUNTER — Other Ambulatory Visit: Payer: Self-pay

## 2019-11-28 DIAGNOSIS — C50512 Malignant neoplasm of lower-outer quadrant of left female breast: Secondary | ICD-10-CM | POA: Diagnosis not present

## 2019-11-28 DIAGNOSIS — Z7189 Other specified counseling: Secondary | ICD-10-CM | POA: Diagnosis not present

## 2019-11-28 DIAGNOSIS — I890 Lymphedema, not elsewhere classified: Secondary | ICD-10-CM | POA: Diagnosis not present

## 2019-11-28 DIAGNOSIS — Z510 Encounter for antineoplastic radiation therapy: Secondary | ICD-10-CM | POA: Diagnosis not present

## 2019-11-28 DIAGNOSIS — Z170 Estrogen receptor positive status [ER+]: Secondary | ICD-10-CM | POA: Diagnosis not present

## 2019-11-28 DIAGNOSIS — C50111 Malignant neoplasm of central portion of right female breast: Secondary | ICD-10-CM | POA: Diagnosis not present

## 2019-11-28 DIAGNOSIS — C50511 Malignant neoplasm of lower-outer quadrant of right female breast: Secondary | ICD-10-CM | POA: Diagnosis not present

## 2019-11-28 DIAGNOSIS — Z0001 Encounter for general adult medical examination with abnormal findings: Secondary | ICD-10-CM | POA: Diagnosis not present

## 2019-11-28 DIAGNOSIS — G40409 Other generalized epilepsy and epileptic syndromes, not intractable, without status epilepticus: Secondary | ICD-10-CM | POA: Diagnosis not present

## 2019-11-28 NOTE — Telephone Encounter (Signed)
During undertreat visit patient mentioned to Dr. Sondra Come that her PCP (Dr. Andree Moro) ordered a mammogram for the patient. Dr. Sondra Come told patient that she should not have a mammogram while receiving active radiation since her breasts will be very sore and inflamed from treatment.   Called patient's PCP office and relayed Dr. Clabe Seal recommendations to staff, as well as the fact that patient's medical oncologist team will help patient get set up for her yearly mammogram once she is medically cleared. Provided my direct call back number should Dr. Posey Pronto have any other questions/concerns from patient's oncology providers.

## 2019-11-29 ENCOUNTER — Ambulatory Visit
Admission: RE | Admit: 2019-11-29 | Discharge: 2019-11-29 | Disposition: A | Discharge status: Home / Self Care | Payer: Medicare Other | Source: Ambulatory Visit | Attending: Radiation Oncology | Admitting: Radiation Oncology

## 2019-11-29 ENCOUNTER — Other Ambulatory Visit: Payer: Self-pay

## 2019-11-29 ENCOUNTER — Ambulatory Visit: Payer: Medicare Other

## 2019-11-29 DIAGNOSIS — C50111 Malignant neoplasm of central portion of right female breast: Secondary | ICD-10-CM | POA: Diagnosis not present

## 2019-11-29 DIAGNOSIS — Z510 Encounter for antineoplastic radiation therapy: Secondary | ICD-10-CM | POA: Diagnosis not present

## 2019-11-29 DIAGNOSIS — C50511 Malignant neoplasm of lower-outer quadrant of right female breast: Secondary | ICD-10-CM | POA: Diagnosis not present

## 2019-11-29 DIAGNOSIS — C50512 Malignant neoplasm of lower-outer quadrant of left female breast: Secondary | ICD-10-CM | POA: Diagnosis not present

## 2019-11-30 ENCOUNTER — Encounter: Payer: Self-pay | Admitting: *Deleted

## 2019-11-30 ENCOUNTER — Ambulatory Visit
Admission: RE | Admit: 2019-11-30 | Discharge: 2019-11-30 | Disposition: A | Discharge status: Home / Self Care | Payer: Medicare Other | Source: Ambulatory Visit | Attending: Radiation Oncology | Admitting: Radiation Oncology

## 2019-11-30 ENCOUNTER — Other Ambulatory Visit: Payer: Self-pay

## 2019-11-30 ENCOUNTER — Ambulatory Visit: Payer: Medicare Other

## 2019-11-30 DIAGNOSIS — C50111 Malignant neoplasm of central portion of right female breast: Secondary | ICD-10-CM | POA: Diagnosis not present

## 2019-11-30 DIAGNOSIS — C50511 Malignant neoplasm of lower-outer quadrant of right female breast: Secondary | ICD-10-CM | POA: Diagnosis not present

## 2019-11-30 DIAGNOSIS — Z510 Encounter for antineoplastic radiation therapy: Secondary | ICD-10-CM | POA: Diagnosis not present

## 2019-11-30 DIAGNOSIS — C50512 Malignant neoplasm of lower-outer quadrant of left female breast: Secondary | ICD-10-CM | POA: Diagnosis not present

## 2019-12-01 ENCOUNTER — Encounter: Payer: Self-pay | Admitting: Radiation Oncology

## 2019-12-01 ENCOUNTER — Ambulatory Visit
Admission: RE | Admit: 2019-12-01 | Discharge: 2019-12-01 | Disposition: A | Discharge status: Home / Self Care | Payer: Medicare Other | Source: Ambulatory Visit | Attending: Radiation Oncology | Admitting: Radiation Oncology

## 2019-12-01 ENCOUNTER — Other Ambulatory Visit: Payer: Self-pay | Admitting: General Practice

## 2019-12-01 ENCOUNTER — Other Ambulatory Visit: Payer: Self-pay

## 2019-12-01 ENCOUNTER — Inpatient Hospital Stay: Payer: Medicare Other | Attending: Hematology and Oncology | Admitting: Hematology and Oncology

## 2019-12-01 ENCOUNTER — Encounter: Payer: Self-pay | Admitting: *Deleted

## 2019-12-01 VITALS — BP 159/72 | HR 74 | Temp 98.2°F | Resp 16 | Ht 63.0 in | Wt 138.3 lb

## 2019-12-01 DIAGNOSIS — C50512 Malignant neoplasm of lower-outer quadrant of left female breast: Secondary | ICD-10-CM | POA: Diagnosis not present

## 2019-12-01 DIAGNOSIS — C50511 Malignant neoplasm of lower-outer quadrant of right female breast: Secondary | ICD-10-CM | POA: Diagnosis not present

## 2019-12-01 DIAGNOSIS — Z79811 Long term (current) use of aromatase inhibitors: Secondary | ICD-10-CM | POA: Insufficient documentation

## 2019-12-01 DIAGNOSIS — C50111 Malignant neoplasm of central portion of right female breast: Secondary | ICD-10-CM | POA: Insufficient documentation

## 2019-12-01 DIAGNOSIS — E2839 Other primary ovarian failure: Secondary | ICD-10-CM

## 2019-12-01 DIAGNOSIS — Z170 Estrogen receptor positive status [ER+]: Secondary | ICD-10-CM | POA: Diagnosis not present

## 2019-12-01 DIAGNOSIS — Z510 Encounter for antineoplastic radiation therapy: Secondary | ICD-10-CM | POA: Diagnosis not present

## 2019-12-01 NOTE — Progress Notes (Signed)
Patient Care Team: Andree Moro, DO as PCP - General (General Practice) Mauro Kaufmann, RN as Oncology Nurse Navigator Rockwell Germany, RN as Oncology Nurse Navigator  DIAGNOSIS:    ICD-10-CM   1. Malignant neoplasm of lower-outer quadrant of left breast of female, estrogen receptor positive ([place])  C50.512    Z17.0   2. Malignant neoplasm of central portion of right breast in female, estrogen receptor positive ([place])  C50.111    Z17.0     SUMMARY OF ONCOLOGIC HISTORY: Oncology History  Malignant neoplasm of lower-outer quadrant of left breast of female, estrogen receptor positive ([place])  01/31/2019 Initial Diagnosis   Palpable lump in the left breast x4 months mammogram 01/27/2019: 2.5 cm mass at 6 o'clock position, 0.7 cm mass retroareolar, axilla negative: Grade 2 IDC ER 100%, PR 100%, HER-2 negative, Ki-67 15% T2N0 stage IB   02/17/2019 Cancer Staging   Staging form: Breast, AJCC 8th Edition - Clinical stage from 02/17/2019: Stage IB (cT2, cN0, cM0, G2, ER+, PR+, HER2-) - Signed by Nicholas Lose, MD on 02/17/2019   02/17/2019 - 09/04/2019 Anti-estrogen oral therapy   Neoadjuvant anastrozole '1mg'$  daily   09/05/2019 Surgery   Left lumpectomy ([name]): IDC, grade 2, 3.0cm, with intermediate grade DCIS, 2 left axillary lymph nodes negative.  Margins positive, right lumpectomy: Benign   09/05/2019 Cancer Staging   Staging form: Breast, AJCC 8th Edition - Pathologic stage from 09/05/2019: No Stage Recommended (ypT2, pN0, cM0, G2, ER+, PR+, HER2-) - Signed by Gardenia Phlegm, NP on 09/20/2019   11/07/2019 -  Radiation Therapy   Adjuvant radiation   Malignant neoplasm of central portion of right breast in female, estrogen receptor positive ([place])  09/05/2018 Cancer Staging   Staging form: Breast, AJCC 8th Edition - Pathologic stage from 09/05/2018: No Stage Recommended (ypT0, pN0, cM0) - Signed by Gardenia Phlegm, NP on 09/20/2019   03/10/2019 Cancer Staging   Staging  form: Breast, AJCC 8th Edition - Clinical stage from 03/10/2019: Stage IA (cT83m, cN0, cM0, G1, ER+, PR+, HER2-) - Signed by CGardenia Phlegm NP on 09/20/2019   09/20/2019 Initial Diagnosis   Malignant neoplasm of central portion of right breast in female, estrogen receptor positive ([place]     CHIEF COMPLIANT: Follow-up to discuss antiestrogen therapy  INTERVAL HISTORY: Robin Diclementeis a 79y.o. with above-mentioned history of left breast cancer who was on neoadjuvant antiestrogen therapy with anastrozole, underwent a left lumpectomy, and is currently on radiation therapy. She presents to the clinic today to discuss antiestrogen therapy. Her major complaint today is swelling of her arm for which she is seeing physical therapy and they have wrapped her arm. She is also complaining of swelling of the breast. She has been continuing to take anastrozole and does not appear to have any problems taking it.  ALLERGIES:  is allergic to penicillins.  MEDICATIONS:  Current Outpatient Medications  Medication Sig Dispense Refill  . anastrozole (ARIMIDEX) 1 MG tablet Take 1 tablet (1 mg total) by mouth daily. 90 tablet 3  . aspirin EC 81 MG tablet Take 81 mg by mouth daily.    . benazepril-hydrochlorthiazide (LOTENSIN HCT) 20-25 MG tablet Take 1 tablet by mouth daily. 90 tablet 3  . calcium carbonate (CALCIUM 600) 600 MG TABS tablet Take 600 mg by mouth daily.    . Cholecalciferol (VITAMIN D3) 50 MCG (2000 UT) TABS Take 2,000 Units by mouth daily.    . primidone (MYSOLINE) 50 MG tablet TAKE 3 TABLETS  BY MOUTH AT BEDTIME 270 tablet 3  . simvastatin (ZOCOR) 20 MG tablet TAKE 1 TABLET BY MOUTH EVERYDAY AT BEDTIME 90 tablet 2   No current facility-administered medications for this visit.    PHYSICAL EXAMINATION: ECOG PERFORMANCE STATUS: 1 - Symptomatic but completely ambulatory  Vitals:   12/01/19 1132  BP: (!) 159/72  Pulse: 74  Resp: 16  Temp: 98.2 F (36.8 C)  SpO2: 100%   Filed  Weights   12/01/19 1132  Weight: 138 lb 4.8 oz (62.7 kg)     LABORATORY DATA:  I have reviewed the data as listed CMP Latest Ref Rng & Units 10/17/2019 10/03/2019 09/01/2019  Glucose 65 - 99 mg/dL 74 110(H) 94  BUN 8 - 27 mg/dL '11 11 13  '$ Creatinine 0.57 - 1.00 mg/dL 0.96 1.02(H) 1.06(H)  Sodium 134 - 144 mmol/L 140 137 138  Potassium 3.5 - 5.2 mmol/L 3.5 3.0(L) 3.6  Chloride 96 - 106 mmol/L 99 99 100  CO2 20 - 29 mmol/L '29 28 29  '$ Calcium 8.7 - 10.3 mg/dL 9.6 9.6 9.6  Total Protein 6.5 - 8.1 g/dL - 7.6 7.2  Total Bilirubin 0.3 - 1.2 mg/dL - 0.7 0.7  Alkaline Phos 38 - 126 U/L - 87 83  AST 15 - 41 U/L - 17 19  ALT 0 - 44 U/L - 12 13    Lab Results  Component Value Date   WBC 6.5 10/03/2019   HGB 12.4 10/03/2019   HCT 38.9 10/03/2019   MCV 88.4 10/03/2019   PLT 220 10/03/2019   NEUTROABS 4.6 10/03/2019    ASSESSMENT & PLAN:  Malignant neoplasm of lower-outer quadrant of left breast of female, estrogen receptor positive ([place]) 01/31/2019:Palpable lump in the left breast x4 months mammogram 01/27/2019: 2.5 cm mass at 6 o'clock position, 0.7 cm mass retroareolar, axilla negative: Grade 2 IDC ER 100%, PR 100%, HER-2 negative, Ki-67 15%  T2N0 stage IB 03/10/2019: Biopsy of the right breast: DCIS, 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Neoadjuvant antiestrogen therapy anastrozole 1 mg daily 02/17/2019 2.Breast conserving surgery 09/05/2019 3. Adjuvant radiation therapy followed by(patient is also not keen on adjuvant radiation) 4.Continuation of adjuvant antiestrogen therapy --------------------------------------------------------------------------------------------------------------------------- Current treatment: Neoadjuvant anastrozole 1 mg daily started 02/17/2019 Anastrozole toxicities:Tolerating it well   09/05/2019:Left lumpectomy ([name]): IDC, grade 2, 3.0cm, with intermediate grade DCIS, 2 left axillary lymph nodes negative.  Invasive carcinoma present posterior  margin focally, inferior margin focally, anterior superior margin broadly and anterior margin broadly. Right lumpectomy: Benign 10/03/2019: Resection of the margins: Negative for malignancy.  Adjuvant radiation 11/07/2019-12/01/2019  Treatment plan: Continuation of antiestrogen therapy with anastrozole 1 mg daily. Patient has been taking anastrozole throughout radiation and does not appear to have any side effects or concerns.  Lymphedema of the arm and the breast: Seeing physical therapy.  Return to clinic in 3 months for survivorship care plan visit.    No orders of the defined types were placed in this encounter.  The patient has a good understanding of the overall plan. she agrees with it. she will call with any problems that may develop before the next visit here.  Total time spent: 30 mins including face to face time and time spent for planning, charting and coordination of care  Nicholas Lose, MD 12/01/2019  I, Cloyde Reams Dorshimer, am acting as scribe for Dr. Nicholas Lose.  I have reviewed the above documentation for accuracy and completeness, and I agree with the above.

## 2019-12-01 NOTE — Assessment & Plan Note (Signed)
01/31/2019:Palpable lump in the left breast x4 months mammogram 01/27/2019: 2.5 cm mass at 6 o'clock position, 0.7 cm mass retroareolar, axilla negative: Grade 2 IDC ER 100%, PR 100%, HER-2 negative, Ki-67 15%  T2N0 stage IB 03/10/2019: Biopsy of the right breast: DCIS, 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Neoadjuvant antiestrogen therapy anastrozole 1 mg daily 02/17/2019 2.Breast conserving surgery 09/05/2019 3. Adjuvant radiation therapy followed by(patient is also not keen on adjuvant radiation) 4.Continuation of adjuvant antiestrogen therapy --------------------------------------------------------------------------------------------------------------------------- Current treatment: Neoadjuvant anastrozole 1 mg daily started 02/17/2019 Anastrozole toxicities:Tolerating it well   09/05/2019:Left lumpectomy ([name]): IDC, grade 2, 3.0cm, with intermediate grade DCIS, 2 left axillary lymph nodes negative.  Invasive carcinoma present posterior margin focally, inferior margin focally, anterior superior margin broadly and anterior margin broadly. Right lumpectomy: Benign 10/03/2019: Resection of the margins: Negative for malignancy.  Adjuvant radiation 11/07/2019-12/01/2019  Treatment plan: Continuation of antiestrogen therapy with anastrozole 1 mg daily.  Return to clinic in 3 months for survivorship care plan visit.

## 2019-12-04 ENCOUNTER — Telehealth: Payer: Self-pay | Admitting: Hematology and Oncology

## 2019-12-04 NOTE — Telephone Encounter (Signed)
Scheduled per 8/13 los. Called and spoke with pt, confirmed 11/12 appt

## 2019-12-14 ENCOUNTER — Other Ambulatory Visit: Payer: Self-pay

## 2019-12-14 ENCOUNTER — Ambulatory Visit: Payer: Medicare Other | Admitting: Physical Therapy

## 2019-12-14 ENCOUNTER — Encounter: Payer: Self-pay | Admitting: Physical Therapy

## 2019-12-14 DIAGNOSIS — R293 Abnormal posture: Secondary | ICD-10-CM | POA: Diagnosis not present

## 2019-12-14 DIAGNOSIS — M25611 Stiffness of right shoulder, not elsewhere classified: Secondary | ICD-10-CM

## 2019-12-14 DIAGNOSIS — C50511 Malignant neoplasm of lower-outer quadrant of right female breast: Secondary | ICD-10-CM | POA: Diagnosis not present

## 2019-12-14 DIAGNOSIS — I890 Lymphedema, not elsewhere classified: Secondary | ICD-10-CM | POA: Diagnosis not present

## 2019-12-14 DIAGNOSIS — M25612 Stiffness of left shoulder, not elsewhere classified: Secondary | ICD-10-CM | POA: Diagnosis not present

## 2019-12-14 DIAGNOSIS — Z170 Estrogen receptor positive status [ER+]: Secondary | ICD-10-CM | POA: Diagnosis not present

## 2019-12-14 NOTE — Therapy (Signed)
[place], [place], [address] Phone: [phone]   Fax:  [phone]  Physical Therapy Treatment  Patient Details  Name: Robin Anderson MRN: [id number] Date of Birth: 01-Aug-1940 Referring Provider (PT): Lindi Adie   Encounter Date: 12/14/2019   PT End of Session - 12/14/19 1248    Visit Number 2    Number of Visits 9    Date for PT Re-Evaluation 12/21/19    PT Start Time 1115    PT Stop Time 1200    PT Time Calculation (min) 45 min    Activity Tolerance Patient tolerated treatment well    Behavior During Therapy [facility] for tasks assessed/performed           Past Medical History:  Diagnosis Date  . Blood in stool   . Cancer [facility])    breast - left  . Cataracts, bilateral   . Chest pain, unspecified   . Family history of breast cancer   . Headache 07/02/2014  . Heart murmur   . Hepatitis   . History of colon polyps   . HLD (hyperlipidemia)   . HTN (hypertension)   . Prediabetes 07/28/2011   no meds, diet controlled  . Seizure disorder ([place])    seizures controlled with med  . Wears glasses     Past Surgical History:  Procedure Laterality Date  . ABDOMINAL HERNIA REPAIR     lower mid line hernia which has reherniatied  . BREAST BIOPSY Left 01/31/2019   Malignant  . BREAST LUMPECTOMY WITH RADIOACTIVE SEED AND SENTINEL LYMPH NODE BIOPSY N/A 09/05/2019   Procedure: RIGHT BREAST SEED LUMPECTOMY X 1, LEFT BREAST SEED LUMPECTOMY X 2, LEFT AXILLARY SENTINEL LYMPH NODE BIOPSY;  Surgeon: Erroll Luna, MD;  Location: [place];  Service: General;  Laterality: N/A;  . BUNIONECTOMY Right    traumatic injury to her foot that was repaired  . RE-EXCISION OF BREAST LUMPECTOMY Left 10/03/2019   Procedure: LEFT BREAST RE-EXCISION OF BREAST LUMPECTOMY;  Surgeon: Erroll Luna, MD;  Location: [place];  Service: General;  Laterality: Left;  . TONSILLECTOMY    . TUBAL LIGATION      There were no vitals filed  for this visit.   Subjective Assessment - 12/14/19 1216    Subjective Pt comes in with hand still wrapped from 11/23/2019.  She has no swelling in hand but fingers are very stiff.  She has hardness in her lentire eft breast with fimness around scar at inferior portion    Pertinent History 01/31/2019:Palpable lump in the left breast x4 months mammogram 01/27/2019: 2.5 cm mass at 6 o'clock position, 0.7 cm mass retroareolar, axilla negative: Grade 2 IDC ER 100%, PR 100%, HER-2 negative, Ki-67 15% T2N0 stage IB11/20/2020: Biopsy of the right breast: DCIS, ER 80%, PR 40%  On 5/18/202 bilateral lumpectomies for treatment of L breast cancer (R was benign). Pt has a SLNB on the L with 2 nodes removed both negative. Pt reports that since her surgery on 09/05/19 she has had swelling in the LUE.  Pt completed radiation on 12/01/2019    Currently in Pain? No/denies                       Outpatient Re[facility]23/[facility]  Lymphedema Life Impact Scale Total Score 44.12 %            [facility] Adult PT Treatment/Exercise - 12/14/19 0001  Exercises   Exercises Shoulder      Shoulder Exercises: Supine   Flexion AAROM;Both;5 reps    Flexion Limitations with dowel rod       Manual Therapy   Manual Therapy Edema management;Manual Lymphatic Drainage (MLD);Passive ROM    Manual therapy comments encouraged [facility] to [facility].She said she has the information to call them;    Edema Management helped pt apply the playtex bra she brought in that should apply mild compression to left breast.  She was repeatedly cautioned to remove bra is she senses any irritation at inferior breast     Manual Lymphatic Drainage (MLD) short neck, 5 diaphragmatic breaths,left inguinal nodes,  stationaly circles to left anterior chest going up over shoulder and down toward abdomen, extra time on left breast being mindful of radiated  skin, then to right sidelying for posterior interaxillay anastamosis and back.     Passive ROM with coconut oil.gentle massage and ROM to fingers of left hand especillay at DIP and PIP joints, PROM to left shoulder.                   PT Education - 12/14/19 1247    Education Details Remove bra at any sign of skin irritation, call to get better fitting compression bra    Person(s) Educated Patient    Methods Explanation    Comprehension Verbalized understanding               PT Long Term Goals - 11/23/19 1356      PT LONG TERM GOAL #1   Title Pt will demonstrate 155 degrees of bilateral shoulder flexion to allow her to reach overhead    Baseline R 142, L 137    Time 4    Period Weeks    Status New    Target Date 12/21/19      PT LONG TERM GOAL #2   Title Pt will demonstrate 155 degrees of bilateral shoulder abduction to allow pt to reach out to the side    Baseline R 143 L 126    Time 4    Period Weeks    Status New    Target Date 12/21/19      PT LONG TERM GOAL #3   Title Pt will report a 50% improvement in left breast swelling to allow improved comfort    Time 4    Period Weeks    Status New    Target Date 12/21/19      PT LONG TERM GOAL #4   Title Pt will report she is able to close her left fist without trouble due to decreased swelling to allow improved comfort    Baseline unable to close hand in a fist    Time 4    Period Weeks    Status New    Target Date 12/21/19      PT LONG TERM GOAL #5   Title Pt will obtain appropriate compression garments for long term management of LUE and L breast lymphedema to decrease risk of cellulitis    Time 4    Period Weeks    Status New    Target Date 12/21/19      Additional Long Term Goals   Additional Long Term Goals Yes      PT LONG TERM GOAL #6   Title Pt will be independent in a home exercise program for long term managment of lymphedema  Time 4    Period Weeks    Status New    Target Date  12/21/19                 Plan - 12/14/19 1248    Clinical Impression Statement Pt presents to PT with hard congested left breast and stiff left fingers ( she had not taken off elastomull since last visit. Pt got some decongestion of left breast with intital MLD but will need continued treatment . She will need caution with compression due to skin fragility and difficulty removing it on her own with signs of discomfort    Personal Factors and Comorbidities Time since onset of injury/illness/exacerbation    Examination-Activity Limitations Lift;Dressing;Carry;Reach Overhead    Stability/Clinical Decision Making Evolving/Moderate complexity    Rehab Potential Good    PT Frequency 2x / week    PT Duration 4 weeks    PT Treatment/Interventions ADLs/Self Care Home Management;Therapeutic exercise;Patient/family education;Manual lymph drainage;Compression bandaging;Manual techniques;Scar mobilization;Passive range of motion;Vasopneumatic Device    PT Next Visit Plan Assess use of compressio and see if she got appt with [facility] [place]. continue MLD to breast and montor skin.  Pt will need careful self care instructions. instruct in lymphedema risk reduction practices, begin PROM to bilateral shoulders progressing to pulleys and ball, MLD to L breast and LUE, check finger ROM and monitor for arm and hand swelling    PT Home Exercise Plan wear chip pack in bra    Consulted and Agree with Plan of Care Patient           Patient will benefit from skilled therapeutic intervention in order to improve the following deficits and impairments:  Decreased range of motion, Decreased scar mobility, Increased edema, Decreased knowledge of precautions, Decreased strength, Increased fascial restricitons, Impaired UE functional use, Postural dysfunction, Pain  Visit Diagnosis: Lymphedema, not elsewhere classified  Stiffness of left shoulder, not elsewhere classified  Stiffness of right shoulder, not  elsewhere classified  Abnormal posture     Problem List Patient Active Problem List   Diagnosis Date Noted  . Malignant neoplasm of central portion of right breast in female, estrogen receptor positive ([place]) 09/20/2019  . Family history of breast cancer   . Malignant neoplasm of lower-outer quadrant of left breast of female, estrogen receptor positive ([place]) 02/17/2019  . Tremor 06/06/2018  . Headache 07/02/2014  . Tonic clonic seizures ([place]) 08/03/2013  . Glucose intolerance (pre-diabetes) 07/29/2011  . HYPOKALEMIA 11/26/2008  . HYPERLIPIDEMIA-MIXED 11/17/2008  . HYPERTENSION, UNSPECIFIED 11/17/2008  . CHEST PAIN-UNSPECIFIED 11/17/2008   Donato Heinz. Owens Shark PT  [place] 12/14/2019, 12:54 PM  [place], [place], [address] Phone: [phone]   Fax:  [phone]  Name: Mayce Noyes MRN: [id number] Date of Birth: [birth year]

## 2019-12-19 ENCOUNTER — Ambulatory Visit: Payer: Medicare Other | Admitting: Physical Therapy

## 2019-12-19 ENCOUNTER — Encounter: Payer: Self-pay | Admitting: Physical Therapy

## 2019-12-19 ENCOUNTER — Other Ambulatory Visit: Payer: Self-pay

## 2019-12-19 DIAGNOSIS — R293 Abnormal posture: Secondary | ICD-10-CM | POA: Diagnosis not present

## 2019-12-19 DIAGNOSIS — I890 Lymphedema, not elsewhere classified: Secondary | ICD-10-CM

## 2019-12-19 DIAGNOSIS — Z170 Estrogen receptor positive status [ER+]: Secondary | ICD-10-CM | POA: Diagnosis not present

## 2019-12-19 DIAGNOSIS — C50511 Malignant neoplasm of lower-outer quadrant of right female breast: Secondary | ICD-10-CM | POA: Diagnosis not present

## 2019-12-19 DIAGNOSIS — M25612 Stiffness of left shoulder, not elsewhere classified: Secondary | ICD-10-CM | POA: Diagnosis not present

## 2019-12-19 DIAGNOSIS — M25611 Stiffness of right shoulder, not elsewhere classified: Secondary | ICD-10-CM | POA: Diagnosis not present

## 2019-12-19 NOTE — Therapy (Signed)
[place], [place], [address] Phone: [phone]   Fax:  [phone]  Physical Therapy Treatment  Patient Details  Name: Robin Anderson MRN: [id number] Date of Birth: 1941-01-30 Referring Provider (PT): Lindi Adie   Encounter Date: 12/19/2019   PT End of Session - 12/19/19 1220    Visit Number 3    Number of Visits 9    Date for PT Re-Evaluation 12/21/19    PT Start Time 1110    PT Stop Time 1150    PT Time Calculation (min) 40 min    Activity Tolerance Patient tolerated treatment well    Behavior During Therapy [facility] for tasks assessed/performed           Past Medical History:  Diagnosis Date  . Blood in stool   . Cancer [facility])    breast - left  . Cataracts, bilateral   . Chest pain, unspecified   . Family history of breast cancer   . Headache 07/02/2014  . Heart murmur   . Hepatitis   . History of colon polyps   . HLD (hyperlipidemia)   . HTN (hypertension)   . Prediabetes 07/28/2011   no meds, diet controlled  . Seizure disorder ([place])    seizures controlled with med  . Wears glasses     Past Surgical History:  Procedure Laterality Date  . ABDOMINAL HERNIA REPAIR     lower mid line hernia which has reherniatied  . BREAST BIOPSY Left 01/31/2019   Malignant  . BREAST LUMPECTOMY WITH RADIOACTIVE SEED AND SENTINEL LYMPH NODE BIOPSY N/A 09/05/2019   Procedure: RIGHT BREAST SEED LUMPECTOMY X 1, LEFT BREAST SEED LUMPECTOMY X 2, LEFT AXILLARY SENTINEL LYMPH NODE BIOPSY;  Surgeon: Erroll Luna, MD;  Location: [place];  Service: General;  Laterality: N/A;  . BUNIONECTOMY Right    traumatic injury to her foot that was repaired  . RE-EXCISION OF BREAST LUMPECTOMY Left 10/03/2019   Procedure: LEFT BREAST RE-EXCISION OF BREAST LUMPECTOMY;  Surgeon: Erroll Luna, MD;  Location: [place];  Service: General;  Laterality: Left;  . TONSILLECTOMY    . TUBAL LIGATION      There were no vitals filed  for this visit.   Subjective Assessment - 12/19/19 1010    Subjective I did not make a call to make an appointment at [facility]. I forgot it. My fingers are still stiff and it has been that way since my second surgery.    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 Pt has a SLNB on the L with 2 nodes removed both negative. Pt reports that since her surgery on 09/05/19 she has had swelling in the LUE.  Pt completed radiation on 12/01/2019    Patient Stated Goals to get the pain and swelling gone and be able to use this hand    Currently in Pain? No/denies    Pain Score 0-No pain                       Outpatient Rehab from 11/23/2019 in [place]  Lymphedema Life Impact Scale Total Score 44.12 %  [facility] Adult PT Treatment/Exercise - 12/19/19 0001      Manual Therapy   Manual Lymphatic Drainage (MLD) short neck, 5 diaphragmatic breaths, right axillary nodes and establishment of interaxillary pathway, left inguinal nodes and establishment of axillo inguinal pathway, L breast moving fluid towards pathways then retracing all steps    Passive ROM PROM to L PIP and DIP joints, DIP joints tight                       PT Long Term Goals - 11/23/19 1356      PT LONG TERM GOAL #1   Title Pt will demonstrate 155 degrees of bilateral shoulder flexion to allow her to reach overhead    Baseline R 142, L 137    Time 4    Period Weeks    Status New    Target Date 12/21/19      PT LONG TERM GOAL #2   Title Pt will demonstrate 155 degrees of bilateral shoulder abduction to allow pt to reach  out to the side    Baseline R 143 L 126    Time 4    Period Weeks    Status New    Target Date 12/21/19      PT LONG TERM GOAL #3   Title Pt will report a 50% improvement in left breast swelling to allow improved comfort    Time 4    Period Weeks    Status New    Target Date 12/21/19      PT LONG TERM GOAL #4   Title Pt will report she is able to close her left fist without trouble due to decreased swelling to allow improved comfort    Baseline unable to close hand in a fist    Time 4    Period Weeks    Status New    Target Date 12/21/19      PT LONG TERM GOAL #5   Title Pt will obtain appropriate compression garments for long term management of LUE and L breast lymphedema to decrease risk of cellulitis    Time 4    Period Weeks    Status New    Target Date 12/21/19      Additional Long Term Goals   Additional Long Term Goals Yes      PT LONG TERM GOAL #6   Title Pt will be independent in a home exercise program for long term managment of lymphedema    Time 4    Period Weeks    Status New    Target Date 12/21/19                 Plan - 12/19/19 1220    Clinical Impression Statement Focused on MLD to L breast today. L breast is extremely fibrotic with possible seroma present. Pt reports she has been trying to massage her breast at home. Some softening of fibrosis noted by end of session. L breast is much larger than R breast. Pt re educated on importance of obtaining a compression bra, she reports she will make an appointment.    PT Frequency 2x / week    PT Duration 4 weeks    PT Treatment/Interventions ADLs/Self Care Home Management;Therapeutic exercise;Patient/family education;Manual lymph drainage;Compression bandaging;Manual techniques;Scar mobilization;Passive range of motion;Vasopneumatic Device    PT Next Visit Plan Is this seroma that needs to be drained?, would compression glove help hand swelling? Assess use of compressio and see if she  got appt with  [facility]. continue MLD to breast and montor skin.  Pt will need careful self care instructions. instruct in lymphedema risk reduction practices, begin PROM to bilateral shoulders progressing to pulleys and ball, MLD to L breast and LUE, check finger ROM and monitor for arm and hand swelling    PT Home Exercise Plan wear chip pack in bra    Consulted and Agree with Plan of Care Patient           Patient will benefit from skilled therapeutic intervention in order to improve the following deficits and impairments:  Decreased range of motion, Decreased scar mobility, Increased edema, Decreased knowledge of precautions, Decreased strength, Increased fascial restricitons, Impaired UE functional use, Postural dysfunction, Pain  Visit Diagnosis: Lymphedema, not elsewhere classified     Problem List Patient Active Problem List   Diagnosis Date Noted  . Malignant neoplasm of central portion of right breast in female, estrogen receptor positive ([place]) 09/20/2019  . Family history of breast cancer   . Malignant neoplasm of lower-outer quadrant of left breast of female, estrogen receptor positive ([place]) 02/17/2019  . Tremor 06/06/2018  . Headache 07/02/2014  . 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 Glucose intolerance (pre-diabetes) 07/29/2011  . HYPOKALEMIA 11/26/2008  . HYPERLIPIDEMIA-MIXED 11/17/2008  . HYPERTENSION, UNSPECIFIED 11/17/2008  . CHEST PAIN-UNSPECIFIED 11/17/2008    Allyson Sabal [facility] 12/19/2019, 12:23 PM  [place], [place], [address] Phone: [phone]   Fax:  [phone]  Name: Robin Anderson MRN: [id number] Date of Birth: [birth year]  Allyson Sabal [place], PT 12/19/19 12:23 PM

## 2019-12-21 ENCOUNTER — Ambulatory Visit: Payer: Medicare Other | Attending: Hematology and Oncology

## 2019-12-21 ENCOUNTER — Other Ambulatory Visit: Payer: Self-pay

## 2019-12-21 DIAGNOSIS — Z170 Estrogen receptor positive status [ER+]: Secondary | ICD-10-CM | POA: Insufficient documentation

## 2019-12-21 DIAGNOSIS — C50511 Malignant neoplasm of lower-outer quadrant of right female breast: Secondary | ICD-10-CM | POA: Insufficient documentation

## 2019-12-21 DIAGNOSIS — R293 Abnormal posture: Secondary | ICD-10-CM

## 2019-12-21 DIAGNOSIS — M25612 Stiffness of left shoulder, not elsewhere classified: Secondary | ICD-10-CM | POA: Diagnosis not present

## 2019-12-21 DIAGNOSIS — M25611 Stiffness of right shoulder, not elsewhere classified: Secondary | ICD-10-CM | POA: Diagnosis not present

## 2019-12-21 DIAGNOSIS — I890 Lymphedema, not elsewhere classified: Secondary | ICD-10-CM | POA: Insufficient documentation

## 2019-12-21 NOTE — Therapy (Signed)
[place], [place], [address] Phone: [phone]   Fax:  [phone]  Physical Therapy Treatment  Patient Details  Name: Robin Anderson MRN: [id number] Date of Birth: [birth year] Referring Provider (PT): Lindi Adie   Encounter Date: 12/21/2019   PT End of Session - 12/21/19 1215    Visit Number 4    Number of Visits 9    Date for PT Re-Evaluation 12/21/19    PT Start Time 1112    PT Stop Time 1209    PT Time Calculation (min) 57 min    Activity Tolerance Patient tolerated treatment well    Behavior During Therapy [facility] for tasks assessed/performed           Past Medical History:  Diagnosis Date  . Blood in stool   . Cancer [facility])    breast - left  . Cataracts, bilateral   . Chest pain, unspecified   . Family history of breast cancer   . Headache 07/02/2014  . Heart murmur   . Hepatitis   . History of colon polyps   . HLD (hyperlipidemia)   . HTN (hypertension)   . Prediabetes 07/28/2011   no meds, diet controlled  . Seizure disorder ([place])    seizures controlled with med  . Wears glasses     Past Surgical History:  Procedure Laterality Date  . ABDOMINAL HERNIA REPAIR     lower mid line hernia which has reherniatied  . BREAST BIOPSY Left 01/31/2019   Malignant  . BREAST LUMPECTOMY WITH RADIOACTIVE SEED AND SENTINEL LYMPH NODE BIOPSY N/A 09/05/2019   Procedure: RIGHT BREAST SEED LUMPECTOMY X 1, LEFT BREAST SEED LUMPECTOMY X 2, LEFT AXILLARY SENTINEL LYMPH NODE BIOPSY;  Surgeon: Erroll Luna, MD;  Location: [place];  Service: General;  Laterality: N/A;  . BUNIONECTOMY Right    traumatic injury to her foot that was repaired  . RE-EXCISION OF BREAST LUMPECTOMY Left 10/03/2019   Procedure: LEFT BREAST RE-EXCISION OF BREAST LUMPECTOMY;  Surgeon: Erroll Luna, MD;  Location: [place];  Service: General;  Laterality: Left;  . TONSILLECTOMY    . TUBAL LIGATION      There were no vitals filed  for this visit.   Subjective Assessment - 12/21/19 1116    Subjective I'm trying to get an appt at [facility] but we've been playing phone tag. My fingers still feel stiff since I was bandaged, but I've noticed my Rt hand feeling stiff too.    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 Pt has a SLNB on the L with 2 nodes removed both negative. Pt reports that since her surgery on 09/05/19 she has had swelling in the LUE.  Pt completed radiation on 12/01/2019    Patient Stated Goals to get the pain and swelling gone and be able to use this hand    Currently in Pain? No/denies   stiff in fingers, sore in wrist                      Outpatient Rehab from 11/23/2019 in [place]  Lymphedema Life Impact Scale Total Score 44.12 %  [facility] Adult PT Treatment/Exercise - 12/21/19 0001      Manual Therapy   Manual Lymphatic Drainage (MLD) short neck, 5 diaphragmatic breaths, right axillary nodes, right intat upper quadrant sequence and establishment of anterior inter-axillary anastomosis, left inguinal nodes and establishment of left axillo-inguinal pathway, Lt breast moving fluid towards pathways then into Rt S/L for further work to lateral breast and redirecting towards Lt axillo-inguinal and posterior inter-axillary anastomosis, then finished in supine retracing all steps reviewing technique and pressure througout with pt as she was doing up and down stretches and to firm.     Passive ROM PROM to L PIP and DIP joints, DIP joints briefly and instructing pt how to do this gently and slowly as she was doing  jerky movements                       PT Long Term Goals - 11/23/19 1356      PT LONG TERM GOAL #1   Title Pt will demonstrate 155 degrees of bilateral shoulder flexion to allow her to reach overhead    Baseline R 142, L 137    Time 4    Period Weeks    Status New    Target Date 12/21/19      PT LONG TERM GOAL #2   Title Pt will demonstrate 155 degrees of bilateral shoulder abduction to allow pt to reach out to the side    Baseline R 143 L 126    Time 4    Period Weeks    Status New    Target Date 12/21/19      PT LONG TERM GOAL #3   Title Pt will report a 50% improvement in left breast swelling to allow improved comfort    Time 4    Period Weeks    Status New    Target Date 12/21/19      PT LONG TERM GOAL #4   Title Pt will report she is able to close her left fist without trouble due to decreased swelling to allow improved comfort    Baseline unable to close hand in a fist    Time 4    Period Weeks    Status New    Target Date 12/21/19      PT LONG TERM GOAL #5   Title Pt will obtain appropriate compression garments for long term management of LUE and L breast lymphedema to decrease risk of cellulitis    Time 4    Period Weeks    Status New    Target Date 12/21/19      Additional Long Term Goals   Additional Long Term Goals Yes      PT LONG TERM GOAL #6   Title Pt will be independent in a home exercise program for long term managment of lymphedema    Time 4    Period Weeks    Status New    Target Date 12/21/19                 Plan - 12/21/19 1216    Clinical Impression Statement Continued with manual lymph drainage to Lt breast reviewing with pt throughout. She demonstrates that she has been doing firm, circular motions around areolar so reviewed proper direction of skin stretches and light pressure only. She was able to return slightly improved demo but required multiple hand over hand and verbal cuing and will need further review. Also  instructed her how to  best passively stretch fingers to be slow and gentle as she was doing quick, jery movements. She was able to return better demo of this. She does demonstrate good osftening of fibrosis of breast, but does appear to have a seroma at superior aspect. She has appt with Dr. Isidore Moos on 01/05/20 so we'll make sure she is updated about our concerns by then. This also gives pt some time to work on improved technique with self MLD. Pt has been playing phone tag with [facility] so encouraged her to call them when she leaves today to see if they could see her now, if not to make an appt ASAP. She verbalized understanding.    Personal Factors and Comorbidities Time since onset of injury/illness/exacerbation    Examination-Activity Limitations Lift;Dressing;Carry;Reach Overhead    Examination-Participation Restrictions Cleaning;Yard Work;Community Activity    Stability/Clinical Decision Making Evolving/Moderate complexity    Rehab Potential Good    PT Frequency 2x / week    PT Duration 4 weeks    PT Treatment/Interventions ADLs/Self Care Home Management;Therapeutic exercise;Patient/family education;Manual lymph drainage;Compression bandaging;Manual techniques;Scar mobilization;Passive range of motion;Vasopneumatic Device    PT Next Visit Plan Start with pulleys and ball roll up wall; Is this seroma that needs to be drained? (pt sees Dr. Isidore Moos 9/17, sent inbox to her), was she able to get appt with [facility] for compression bra?  would compression glove help hand swelling? Assess use of compression; continue MLD to breast and montor skin.  Pt will need careful self care instructions. instruct in lymphedema risk reduction practices, begin PROM to bilateral shoulders, cont MLD to L breast and LUE (prn), check finger ROM and monitor for arm and hand swelling    PT Home Exercise Plan wear chip pack in bra    Consulted and Agree with Plan of Care Patient           Patient will benefit  from skilled therapeutic intervention in order to improve the following deficits and impairments:  Decreased range of motion, Decreased scar mobility, Increased edema, Decreased knowledge of precautions, Decreased strength, Increased fascial restricitons, Impaired UE functional use, Postural dysfunction, Pain  Visit Diagnosis: Lymphedema, not elsewhere classified  Stiffness of left shoulder, not elsewhere classified  Stiffness of right shoulder, not elsewhere classified  Abnormal posture  Malignant neoplasm of lower-outer quadrant of right breast of female, estrogen receptor positive ([place])     Problem List Patient Active Problem List   Diagnosis Date Noted  . Malignant neoplasm of central portion of right breast in female, estrogen receptor positive ([place]) 09/20/2019  . Family history of breast cancer   . Malignant neoplasm of lower-outer quadrant of left breast of female, estrogen receptor positive ([place]) 02/17/2019  . Tremor 06/06/2018  . Headache 07/02/2014  . 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 Glucose intolerance (pre-diabetes) 07/29/2011  . HYPOKALEMIA 11/26/2008  . HYPERLIPIDEMIA-MIXED 11/17/2008  . HYPERTENSION, UNSPECIFIED 11/17/2008  . CHEST PAIN-UNSPECIFIED 11/17/2008    Otelia Limes, PTA 12/21/2019, 12:28 PM  [place], [place], [address] Phone: [phone]   Fax:  [phone]  Name: Naija Troost MRN: [id number] Date of Birth: 06-Sep-1940

## 2019-12-27 ENCOUNTER — Ambulatory Visit: Payer: Medicare Other

## 2019-12-27 ENCOUNTER — Other Ambulatory Visit: Payer: Self-pay

## 2019-12-27 DIAGNOSIS — C50511 Malignant neoplasm of lower-outer quadrant of right female breast: Secondary | ICD-10-CM

## 2019-12-27 DIAGNOSIS — M25612 Stiffness of left shoulder, not elsewhere classified: Secondary | ICD-10-CM | POA: Diagnosis not present

## 2019-12-27 DIAGNOSIS — Z170 Estrogen receptor positive status [ER+]: Secondary | ICD-10-CM

## 2019-12-27 DIAGNOSIS — M25611 Stiffness of right shoulder, not elsewhere classified: Secondary | ICD-10-CM | POA: Diagnosis not present

## 2019-12-27 DIAGNOSIS — R293 Abnormal posture: Secondary | ICD-10-CM

## 2019-12-27 DIAGNOSIS — I890 Lymphedema, not elsewhere classified: Secondary | ICD-10-CM

## 2019-12-27 NOTE — Addendum Note (Signed)
Addended by: Kipp Laurence on: 12/27/2019 04:57 PM   Modules accepted: Orders

## 2019-12-27 NOTE — Therapy (Signed)
[place], [place], [address] Phone: [phone]   Fax:  [phone]  Physical Therapy Treatment  Patient Details  Name: Robin Anderson MRN: [id number] Date of Birth: Aug 12, 1940 Referring Provider (PT): Lindi Adie   Encounter Date: 12/27/2019   PT End of Session - 12/27/19 1105    Visit Number 5    Number of Visits 17    Date for PT Re-Evaluation 01/24/20    PT Start Time 1006    PT Stop Time 1108    PT Time Calculation (min) 62 min    Activity Tolerance Patient tolerated treatment well    Behavior During Therapy [facility] for tasks assessed/performed           Past Medical History:  Diagnosis Date  . Blood in stool   . Cancer [facility]    breast - left  . Cataracts, bilateral   . Chest pain, unspecified   . Family history of breast cancer   . Headache 07/02/2014  . Heart murmur   . Hepatitis   . History of colon polyps   . HLD (hyperlipidemia)   . HTN (hypertension)   . Prediabetes 07/28/2011   no meds, diet controlled  . Seizure disorder ([place])    seizures controlled with med  . Wears glasses     Past Surgical History:  Procedure Laterality Date  . ABDOMINAL HERNIA REPAIR     lower mid line hernia which has reherniatied  . BREAST BIOPSY Left 01/31/2019   Malignant  . BREAST LUMPECTOMY WITH RADIOACTIVE SEED AND SENTINEL LYMPH NODE BIOPSY N/A 09/05/2019   Procedure: RIGHT BREAST SEED LUMPECTOMY X 1, LEFT BREAST SEED LUMPECTOMY X 2, LEFT AXILLARY SENTINEL LYMPH NODE BIOPSY;  Surgeon: Erroll Luna, MD;  Location: [place];  Service: General;  Laterality: N/A;  . BUNIONECTOMY Right    traumatic injury to her foot that was repaired  . RE-EXCISION OF BREAST LUMPECTOMY Left 10/03/2019   Procedure: LEFT BREAST RE-EXCISION OF BREAST LUMPECTOMY;  Surgeon: Erroll Luna, MD;  Location: [place];  Service: General;  Laterality: Left;  . TONSILLECTOMY    . TUBAL LIGATION      There were no vitals filed  for this visit.   Subjective Assessment - 12/27/19 1012    Subjective I started doing epsom salt soaks for my Lt hand and I can tell it's gotten just a little bit looser so I'm going to keep doing that. My Lt breast is doing about the same but I go tomorrow to get fitted for a compression bra.    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 Pt has a SLNB on the L with 2 nodes removed both negative. Pt reports that since her surgery on 09/05/19 she has had swelling in the LUE.  Pt completed radiation on 12/01/2019    Patient Stated Goals to get the pain and swelling gone and be able to use this hand    Currently in Pain? No/denies   just some stiffness in fingers, but improving             [facility] PT Assessment - 12/27/19 0001      AROM  Right Shoulder Flexion 145 Degrees    Right Shoulder ABduction 137 Degrees    Left Shoulder Flexion 140 Degrees    Left Shoulder ABduction 130 Degrees                   Out[facility]  Lymphedema Life Impact Scale Total Score 44.12 %            [facility] Adult PT Treatment/Exercise - 12/27/19 0001      Shoulder Exercises: Pulleys   Flexion 2 minutes    Flexion Limitations Demo for pt and she returned demo but tactile and VCs to decrease Lt shoulder hike    ABduction 2 minutes    ABduction Limitations Same cues as above      Shoulder Exercises: Therapy Ball   Flexion Both;10 reps   forward lean into end of stretch   Flexion Limitations Pt returned pts demo      Manual Therapy   Manual Lymphatic Drainage (MLD) short neck, 5 diaphragmatic  breaths, right axillary nodes, right intact upper quadrant sequence and establishment of anterior inter-axillary anastomosis, left inguinal nodes and establishment of left axillo-inguinal pathway, Lt breast moving fluid towards pathways then into Rt S/L for further work to lateral breast and redirecting towards Lt axillo-inguinal and posterior inter-axillary anastomosis, then finished in supine retracing all steps reviewing technique and pressure througout with pt as she was doing up and down stretches and to firm.     Passive ROM To LT shoulder into flexion and abduction; then to L PIP and DIP joints, DIP joints briefly and reviewing with pt how to do this gently and slowly as she was still doing jerky movements                       PT Long Term Goals - 12/27/19 1229      PT LONG TERM GOAL #1   Title Pt will demonstrate 155 degrees of bilateral shoulder flexion to allow her to reach overhead    Baseline R 142, L 137; R 145 and L 140 - 12/27/19    Status On-going      PT LONG TERM GOAL #2   Title Pt will demonstrate 155 degrees of bilateral shoulder abduction to allow pt to reach out to the side    Baseline R 143 L 126; R 137 and L 130 - 12/27/19    Status On-going      PT LONG TERM GOAL #3   Title Pt will report a 50% improvement in left breast swelling to allow improved comfort    Baseline Pt did not rate today but today is first time she is able to report some softening noted at superior aspect of breast-12/27/19    Status On-going      PT LONG TERM GOAL #4   Title Pt will report she is able to close her left fist without trouble due to decreased swelling to allow improved comfort    Baseline unable to close hand in a fist; pt has had min improvement with this as of late with continued P/ROM and epsom salt soaks-12/27/19    Status On-going      PT LONG TERM GOAL #5   Title Pt will obtain appropriate compression garments for long term management of LUE and L breast lymphedema to  decrease risk of cellulitis    Baseline Pt has appt to be [facility] [place] [facility] compression  bra tomorrow - 12/27/19    Status On-going      PT LONG TERM GOAL #6   Title Pt will be independent in a home exercise program for long term managment of lymphedema    Baseline Pt is independent with current HEP of self MLD-12/27/19    Status On-going                 Plan - 12/27/19 1233    Clinical Impression Statement Pt comes in reporting she has appt tomorrow to be measured for compression bra and that her hand has started feeling some better with doing epsom salt soaks for past 3 days. She demonstrates P/ROM stretching she has been doing and this is still quick, jerky movements so, with demo, reviewed importance with her of slow, prolonged hold motions with stretching and how this will help the muscles and tendons relax. Also how the quick movements will cause the muscles and tendons to tighten up. Also encouarged her to try these stretches after she has been soaking her hand for a few minutes and to strtch in the warm water. She verbalized understanding all. Continued with manual therapy for Lt breast MLDa nd shoulder tightness. Some softening is noted at Lt breast, mostly superior aspect, so hopefully with wear of compression bra, once she receives this, further progress will be noted. Bil A/ROM of shoulders shows some improvement with all except Rt abduction but pt seemed to have trouble understanding, even with demo, and this was affecting measurements. Renewal sent today as pt will benefit from continuing towards al unment goals and progressing HEP.    Personal Factors and Comorbidities Time since onset of injury/illness/exacerbation    Examination-Activity Limitations Lift;Dressing;Carry;Reach Overhead    Examination-Participation Restrictions Cleaning;Yard Work;Community Activity    Stability/Clinical Decision Making Evolving/Moderate complexity    Rehab Potential Good    PT  Frequency 2x / week    PT Duration 4 weeks    PT Treatment/Interventions ADLs/Self Care Home Management;Therapeutic exercise;Patient/family education;Manual lymph drainage;Compression bandaging;Manual techniques;Scar mobilization;Passive range of motion;Vasopneumatic Device    PT Next Visit Plan Try adding supine dowel exercises to HEP; How was P/ROM of Lt hand in epsom salt? Cont with pulleys and ball roll up wall, would compression glove help hand swelling? Assess new compression bra when pt brings this; continue MLD to breast and montor skin.  instruct in lymphedema risk reduction practices, cont PROM to bilateral shoulders, cont MLD to L breast and LUE (prn), check finger ROM and monitor for arm and hand swelling    PT Home Exercise Plan wear chip pack in bra; cont self MLD           Patient will benefit from skilled therapeutic intervention in order to improve the following deficits and impairments:  Decreased range of motion, Decreased scar mobility, Increased edema, Decreased knowledge of precautions, Decreased strength, Increased fascial restricitons, Impaired UE functional use, Postural dysfunction, Pain  Visit Diagnosis: Lymphedema, not elsewhere classified  Stiffness of left shoulder, not elsewhere classified  Stiffness of right shoulder, not elsewhere classified  Abnormal posture  Malignant neoplasm of lower-outer quadrant of right breast of female, estrogen receptor positive ([place])     Problem List Patient Active Problem List   Diagnosis Date Noted  . Malignant neoplasm of central portion of right breast in female, estrogen receptor positive ([place]) 09/20/2019  . Family history of breast cancer   . Malignant neoplasm of lower-outer quadrant of left breast of female, estrogen receptor positive ([place]) 02/17/2019  .  Tremor 06/06/2018  . Headache 07/02/2014  . 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 Glucose intolerance (pre-diabetes) 07/29/2011  . HYPOKALEMIA 11/26/2008  .  HYPERLIPIDEMIA-MIXED 11/17/2008  . HYPERTENSION, UNSPECIFIED 11/17/2008  . CHEST PAIN-UNSPECIFIED 11/17/2008    Otelia Limes, PTA 12/27/2019, 12:42 PM  [place], [place], [address] Phone: [phone]   Fax:  [phone]  Name: Robin Anderson MRN: [id number] Date of Birth: 18-Jul-1940

## 2019-12-29 ENCOUNTER — Ambulatory Visit: Payer: Medicare Other

## 2019-12-29 ENCOUNTER — Other Ambulatory Visit: Payer: Self-pay

## 2019-12-29 DIAGNOSIS — Z170 Estrogen receptor positive status [ER+]: Secondary | ICD-10-CM | POA: Diagnosis not present

## 2019-12-29 DIAGNOSIS — M25611 Stiffness of right shoulder, not elsewhere classified: Secondary | ICD-10-CM

## 2019-12-29 DIAGNOSIS — C50511 Malignant neoplasm of lower-outer quadrant of right female breast: Secondary | ICD-10-CM

## 2019-12-29 DIAGNOSIS — I890 Lymphedema, not elsewhere classified: Secondary | ICD-10-CM

## 2019-12-29 DIAGNOSIS — M25612 Stiffness of left shoulder, not elsewhere classified: Secondary | ICD-10-CM

## 2019-12-29 DIAGNOSIS — R293 Abnormal posture: Secondary | ICD-10-CM

## 2019-12-29 NOTE — Therapy (Signed)
[place], [place], [address] Phone: [phone]   Fax:  [phone]  Physical Therapy Treatment  Patient Details  Name: Robin Anderson MRN: [id number] Date of Birth: [birth year] Referring Provider (PT): Lindi Adie   Encounter Date: 12/29/2019   PT End of Session - 12/29/19 1101    Visit Number 6    Number of Visits 17    Date for PT Re-Evaluation 01/24/20    PT Start Time 1006    PT Stop Time 1103    PT Time Calculation (min) 57 min    Activity Tolerance Patient tolerated treatment well    Behavior During Therapy [facility] for tasks assessed/performed           Past Medical History:  Diagnosis Date  . Blood in stool   . Cancer [facility])    breast - left  . Cataracts, bilateral   . Chest pain, unspecified   . Family history of breast cancer   . Headache 07/02/2014  . Heart murmur   . Hepatitis   . History of colon polyps   . HLD (hyperlipidemia)   . HTN (hypertension)   . Prediabetes 07/28/2011   no meds, diet controlled  . Seizure disorder ([place])    seizures controlled with med  . Wears glasses     Past Surgical History:  Procedure Laterality Date  . ABDOMINAL HERNIA REPAIR     lower mid line hernia which has reherniatied  . BREAST BIOPSY Left 01/31/2019   Malignant  . BREAST LUMPECTOMY WITH RADIOACTIVE SEED AND SENTINEL LYMPH NODE BIOPSY N/A 09/05/2019   Procedure: RIGHT BREAST SEED LUMPECTOMY X 1, LEFT BREAST SEED LUMPECTOMY X 2, LEFT AXILLARY SENTINEL LYMPH NODE BIOPSY;  Surgeon: Erroll Luna, MD;  Location: [place];  Service: General;  Laterality: N/A;  . BUNIONECTOMY Right    traumatic injury to her foot that was repaired  . RE-EXCISION OF BREAST LUMPECTOMY Left 10/03/2019   Procedure: LEFT BREAST RE-EXCISION OF BREAST LUMPECTOMY;  Surgeon: Erroll Luna, MD;  Location: [place];  Service: General;  Laterality: Left;  . TONSILLECTOMY    . TUBAL LIGATION      There were no vitals  filed for this visit.   Subjective Assessment - 12/29/19 1015    Subjective I got my compression bra yesterday and want you to check it. It feels comfortable and I like it. I tried doing the ROM for my hand like you showed me in the epsom salt and that felt good. Just have stiffness in my hand and fingers but I think it's getting better.    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 Pt has a SLNB on the L with 2 nodes removed both negative. Pt reports that since her surgery on 09/05/19 she has had swelling in the LUE.  Pt completed radiation on 12/01/2019    Patient Stated Goals to get the pain and swelling gone and be able to use this hand    Currently in Pain? No/denies   just stiff in fingers/hand  [facility]patient [facility]  Lymphedema Life Impact Scale Total Score 44.12 %            [facility] Adult PT Treatment/Exercise - 12/29/19 0001      Shoulder Exercises: Pulleys   Flexion 2 minutes    Flexion Limitations Continued to require CVs to decresae Lt sapula compensation      Manual Therapy   Manual Lymphatic Drainage (MLD) short neck, 5 diaphragmatic breaths, right axillary nodes, right intact upper quadrant sequence and establishment of anterior inter-axillary anastomosis, left inguinal nodes and establishment of left axillo-inguinal pathway, Lt breast moving fluid towards pathways then into Rt S/L for further work to lateral breast and redirecting towards Lt axillo-inguinal and posterior inter-axillary anastomosis, then finished in supine retracing all steps reviewing  technique and pressure througout with pt as she was doing up and down stretches and to firm.     Passive ROM To Lt shoulder into flexion and abduction; then to L PIP and DIP joints, DIP joints briefly and reviewing with pt how to do this gently and slowly as she was still doing jerky movements                       PT Long Term Goals - 12/27/19 1229      PT LONG TERM GOAL #1   Title Pt will demonstrate 155 degrees of bilateral shoulder flexion to allow her to reach overhead    Baseline R 142, L 137; R 145 and L 140 - 12/27/19    Status On-going      PT LONG TERM GOAL #2   Title Pt will demonstrate 155 degrees of bilateral shoulder abduction to allow pt to reach out to the side    Baseline R 143 L 126; R 137 and L 130 - 12/27/19    Status On-going      PT LONG TERM GOAL #3   Title Pt will report a 50% improvement in left breast swelling to allow improved comfort    Baseline Pt did not rate today but today is first time she is able to report some softening noted at superior aspect of breast-12/27/19    Status On-going      PT LONG TERM GOAL #4   Title Pt will report she is able to close her left fist without trouble due to decreased swelling to allow improved comfort    Baseline unable to close hand in a fist; pt has had min improvement with this as of late with continued P/ROM and epsom salt soaks-12/27/19    Status On-going      PT LONG TERM GOAL #5   Title Pt will obtain appropriate compression garments for long term management of LUE and L breast lymphedema to decrease risk of cellulitis    Baseline Pt has appt to be [facility] compression bra tomorrow - 12/27/19    Status On-going      PT LONG TERM GOAL #6   Title Pt will be independent in a home exercise program for long term managment of lymphedema    Baseline Pt is independent with current HEP of self MLD-12/27/19    Status On-going                 Plan - 12/29/19 1102    Clinical Impression  Statement Pt comes in wearing her new compresion bra she received yesterday. This was a very good fit, she  just requires assistance donning with hooks and zipper. Husband able to assist with this at home. Encouraged pt to wear her chip pack as able also in her compression bra if it fits. She verbalized understanding. Continued manual lymph drainage to Lt breast and P/ROM of Lt shoulder.    Personal Factors and Comorbidities Time since onset of injury/illness/exacerbation    Examination-Activity Limitations Lift;Dressing;Carry;Reach Overhead    Examination-Participation Restrictions Cleaning;Yard Work;Community Activity    Stability/Clinical Decision Making Evolving/Moderate complexity    Rehab Potential Good    PT Frequency 2x / week    PT Duration 4 weeks    PT Treatment/Interventions ADLs/Self Care Home Management;Therapeutic exercise;Patient/family education;Manual lymph drainage;Compression bandaging;Manual techniques;Scar mobilization;Passive range of motion;Vasopneumatic Device    PT Next Visit Plan Try adding supine dowel exercises to HEP; May inbox Dr. Isidore Moos about possible seroma of Lt breast for her appt on 9/17; how is breast after wearing compression bra more? Cont with pulleys and ball roll up wall, would compression glove help hand swelling? Assess new compression bra when pt brings this; continue MLD to breast and montor skin.  instruct in lymphedema risk reduction practices, cont PROM to bilateral shoulders, cont MLD to L breast and LUE (prn), check finger ROM and monitor for arm and hand swelling    PT Home Exercise Plan wear chip pack in bra; cont self MLD    Consulted and Agree with Plan of Care Patient           Patient will benefit from skilled therapeutic intervention in order to improve the following deficits and impairments:  Decreased range of motion, Decreased scar mobility, Increased edema, Decreased knowledge of precautions, Decreased strength, Increased fascial  restricitons, Impaired UE functional use, Postural dysfunction, Pain  Visit Diagnosis: Lymphedema, not elsewhere classified  Stiffness of left shoulder, not elsewhere classified  Stiffness of right shoulder, not elsewhere classified  Abnormal posture  Malignant neoplasm of lower-outer quadrant of right breast of female, estrogen receptor positive ([place])     Problem List Patient Active Problem List   Diagnosis Date Noted  . Malignant neoplasm of central portion of right breast in female, estrogen receptor positive ([place]) 09/20/2019  . Family history of breast cancer   . Malignant neoplasm of lower-outer quadrant of left breast of female, estrogen receptor positive ([place]) 02/17/2019  . Tremor 06/06/2018  . Headache 07/02/2014  . 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 Glucose intolerance (pre-diabetes) 07/29/2011  . HYPOKALEMIA 11/26/2008  . HYPERLIPIDEMIA-MIXED 11/17/2008  . HYPERTENSION, UNSPECIFIED 11/17/2008  . CHEST PAIN-UNSPECIFIED 11/17/2008    Otelia Limes, PTA 12/29/2019, 11:14 AM  [place], [place], [address] Phone: [phone]   Fax:  [phone]  Name: Robin Anderson MRN: [id number] Date of Birth: [birth year]

## 2020-01-01 ENCOUNTER — Ambulatory Visit: Payer: Medicare Other | Admitting: Physical Therapy

## 2020-01-01 ENCOUNTER — Encounter: Payer: Self-pay | Admitting: Physical Therapy

## 2020-01-01 ENCOUNTER — Other Ambulatory Visit: Payer: Self-pay

## 2020-01-01 DIAGNOSIS — R293 Abnormal posture: Secondary | ICD-10-CM | POA: Diagnosis not present

## 2020-01-01 DIAGNOSIS — C50511 Malignant neoplasm of lower-outer quadrant of right female breast: Secondary | ICD-10-CM | POA: Diagnosis not present

## 2020-01-01 DIAGNOSIS — M25611 Stiffness of right shoulder, not elsewhere classified: Secondary | ICD-10-CM | POA: Diagnosis not present

## 2020-01-01 DIAGNOSIS — I890 Lymphedema, not elsewhere classified: Secondary | ICD-10-CM | POA: Diagnosis not present

## 2020-01-01 DIAGNOSIS — Z170 Estrogen receptor positive status [ER+]: Secondary | ICD-10-CM | POA: Diagnosis not present

## 2020-01-01 DIAGNOSIS — M25612 Stiffness of left shoulder, not elsewhere classified: Secondary | ICD-10-CM | POA: Diagnosis not present

## 2020-01-01 NOTE — Patient Instructions (Signed)
Shoulder: Flexion (Supine)    With hands shoulder width apart, slowly lower dowel to floor behind head. Do not let elbows bend. Keep back flat. Hold _5___ seconds. Repeat _10___ times. Do _2___ sessions per day. CAUTION: Stretch slowly and gently.  Copyright  VHI. All rights reserved.  Shoulder: Abduction (Supine)    With left arm flat on floor, hold dowel in palm. Slowly move arm up to side of head by pushing with opposite arm. Do not let elbow bend. Hold _5___ seconds. Repeat __10__ times. Do _2___ sessions per day. CAUTION: Stretch slowly and gently.  Copyright  VHI. All rights reserved.

## 2020-01-01 NOTE — Therapy (Signed)
[place], [place], [address] Phone: [phone]   Fax:  [phone]  Physical Therapy Treatment  Patient Details  Name: Robin Anderson MRN: [id number] Date of Birth: December 18, 1940 Referring Provider (PT): Lindi Adie   Encounter Date: 01/01/2020   PT End of Session - 01/01/20 1002    Visit Number 7    Number of Visits 17    Date for PT Re-Evaluation 01/24/20    PT Start Time 0908    PT Stop Time 0958    PT Time Calculation (min) 50 min    Activity Tolerance Patient tolerated treatment well    Behavior During Therapy [facility] for tasks assessed/performed           Past Medical History:  Diagnosis Date  . Blood in stool   . Cancer [facility])    breast - left  . Cataracts, bilateral   . Chest pain, unspecified   . Family history of breast cancer   . Headache 07/02/2014  . Heart murmur   . Hepatitis   . History of colon polyps   . HLD (hyperlipidemia)   . HTN (hypertension)   . Prediabetes 07/28/2011   no meds, diet controlled  . Seizure disorder ([place])    seizures controlled with med  . Wears glasses     Past Surgical History:  Procedure Laterality Date  . ABDOMINAL HERNIA REPAIR     lower mid line hernia which has reherniatied  . BREAST BIOPSY Left 01/31/2019   Malignant  . BREAST LUMPECTOMY WITH RADIOACTIVE SEED AND SENTINEL LYMPH NODE BIOPSY N/A 09/05/2019   Procedure: RIGHT BREAST SEED LUMPECTOMY X 1, LEFT BREAST SEED LUMPECTOMY X 2, LEFT AXILLARY SENTINEL LYMPH NODE BIOPSY;  Surgeon: Erroll Luna, MD;  Location: [place];  Service: General;  Laterality: N/A;  . BUNIONECTOMY Right    traumatic injury to her foot that was repaired  . RE-EXCISION OF BREAST LUMPECTOMY Left 10/03/2019   Procedure: LEFT BREAST RE-EXCISION OF BREAST LUMPECTOMY;  Surgeon: Erroll Luna, MD;  Location: [place];  Service: General;  Laterality: Left;  . TONSILLECTOMY    . TUBAL LIGATION      There were no vitals  filed for this visit.   Subjective Assessment - 01/01/20 0909    Subjective The compression bra is going well if I can just get it snapped and zipped. The epsom salt water is helping. I can move them more.    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 Pt has a SLNB on the L with 2 nodes removed both negative. Pt reports that since her surgery on 09/05/19 she has had swelling in the LUE.  Pt completed radiation on 12/01/2019    Patient Stated Goals to get the pain and swelling gone and be able to use this hand    Currently in Pain? No/denies    Pain Score 0-No pain                       Outpatient Rehab from 11/23/2019 in [facility]  Lymphedema Life Impact Scale Total Score 44.12 %  [facility] Adult PT Treatment/Exercise - 01/01/20 0001      Shoulder Exercises: Supine   Flexion AAROM;Both;10 reps   with dowel and 5 sec holds, pt returned therapist demo   ABduction AAROM;Left;10 reps   with dowel, 5 sec holds, pt returned therapist demo     Manual Therapy   Manual Lymphatic Drainage (MLD) short neck, 5 diaphragmatic breaths, right axillary nodes, right intact upper quadrant sequence and establishment of anterior inter-axillary anastomosis, left inguinal nodes and establishment of left axillo-inguinal pathway, Lt breast moving fluid towards pathways then into Rt S/L for further work to lateral breast and redirecting towards Lt axillo-inguinal and posterior inter-axillary anastomosis, then finished in supine retracing all steps reviewing technique and pressure througout with pt as she was doing up and down  stretches and to firm.     Passive ROM To Lt shoulder into flexion and abduction; then to L PIP and DIP joints, DIP joints                        PT Long Term Goals - 12/27/19 1229      PT LONG TERM GOAL #1   Title Pt will demonstrate 155 degrees of bilateral shoulder flexion to allow her to reach overhead    Baseline R 142, L 137; R 145 and L 140 - 12/27/19    Status On-going      PT LONG TERM GOAL #2   Title Pt will demonstrate 155 degrees of bilateral shoulder abduction to allow pt to reach out to the side    Baseline R 143 L 126; R 137 and L 130 - 12/27/19    Status On-going      PT LONG TERM GOAL #3   Title Pt will report a 50% improvement in left breast swelling to allow improved comfort    Baseline Pt did not rate today but today is first time she is able to report some softening noted at superior aspect of breast-12/27/19    Status On-going      PT LONG TERM GOAL #4   Title Pt will report she is able to close her left fist without trouble due to decreased swelling to allow improved comfort    Baseline unable to close hand in a fist; pt has had min improvement with this as of late with continued P/ROM and epsom salt soaks-12/27/19    Status On-going      PT LONG TERM GOAL #5   Title Pt will obtain appropriate compression garments for long term management of LUE and L breast lymphedema to decrease risk of cellulitis    Baseline Pt has appt to be [facility] for compression bra tomorrow - 12/27/19    Status On-going      PT LONG TERM GOAL #6   Title Pt will be independent in a home exercise program for long term managment of lymphedema    Baseline Pt is independent with current HEP of self MLD-12/27/19    Status On-going                 Plan - 01/01/20 0956    Clinical Impression Statement Pt reports that the swelling goes down at times but at night it seems to get worse. Educated pt to wear her compression bra at night with the chip pack in it.  Added new AAROM exercises today to pt's HEP to improve L shoulder ROM. Continued with MLD to L breast to  help reduce swelling.    PT Frequency 2x / week    PT Duration 4 weeks    PT Treatment/Interventions ADLs/Self Care Home Management;Therapeutic exercise;Patient/family education;Manual lymph drainage;Compression bandaging;Manual techniques;Scar mobilization;Passive range of motion;Vasopneumatic Device    PT Next Visit Plan see if dowel exercises are going ok, encourage pt to make additional appts - pt wanted to see Dr Isidore Moos first but she has limited ROM and afraid she will not be able to get in for a few weeks if she waits, see if wearing bra at night helped, continue ROM to L shoulder and MLD to L breast    PT Home Exercise Plan wear chip pack in bra; cont self MLD, supine dowel ex    Consulted and Agree with Plan of Care Patient           Patient will benefit from skilled therapeutic intervention in order to improve the following deficits and impairments:  Decreased range of motion, Decreased scar mobility, Increased edema, Decreased knowledge of precautions, Decreased strength, Increased fascial restricitons, Impaired UE functional use, Postural dysfunction, Pain  Visit Diagnosis: Lymphedema, not elsewhere classified  Stiffness of left shoulder, not elsewhere classified  Abnormal posture     Problem List Patient Active Problem List   Diagnosis Date Noted  . Malignant neoplasm of central portion of right breast in female, estrogen receptor positive ([place]) 09/20/2019  . Family history of breast cancer   . Malignant neoplasm of lower-outer quadrant of left breast of female, estrogen receptor positive ([place]) 02/17/2019  . Tremor 06/06/2018  . Headache 07/02/2014  . 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 Glucose intolerance (pre-diabetes) 07/29/2011  . HYPOKALEMIA 11/26/2008  . HYPERLIPIDEMIA-MIXED 11/17/2008  . HYPERTENSION, UNSPECIFIED 11/17/2008  . CHEST PAIN-UNSPECIFIED  11/17/2008    Allyson Sabal [facility] 01/01/2020, 10:03 AM  [place], [place], [address] Phone: [phone]   Fax:  [phone]  Name: Robin Anderson MRN: [id number] Date of Birth: 28-Mar-1941  Manus Gunning, PT 01/01/20 10:03 AM

## 2020-01-03 ENCOUNTER — Ambulatory Visit: Payer: Medicare Other

## 2020-01-03 ENCOUNTER — Other Ambulatory Visit: Payer: Self-pay

## 2020-01-03 DIAGNOSIS — R293 Abnormal posture: Secondary | ICD-10-CM | POA: Diagnosis not present

## 2020-01-03 DIAGNOSIS — Z170 Estrogen receptor positive status [ER+]: Secondary | ICD-10-CM | POA: Diagnosis not present

## 2020-01-03 DIAGNOSIS — I890 Lymphedema, not elsewhere classified: Secondary | ICD-10-CM

## 2020-01-03 DIAGNOSIS — M25611 Stiffness of right shoulder, not elsewhere classified: Secondary | ICD-10-CM

## 2020-01-03 DIAGNOSIS — C50511 Malignant neoplasm of lower-outer quadrant of right female breast: Secondary | ICD-10-CM | POA: Diagnosis not present

## 2020-01-03 DIAGNOSIS — M25612 Stiffness of left shoulder, not elsewhere classified: Secondary | ICD-10-CM

## 2020-01-03 NOTE — Patient Instructions (Signed)
SHOULDER: Flexion - Supine (Cane)        Cancer Rehab [phone]    Hold cane in both hands. Raise arms up overhead. Do not allow back to arch. Hold _5__ seconds. Do __5-10__ times; __1-2__ times a day.   SELF ASSISTED WITH OBJECT: Shoulder Abduction / Adduction - Supine    Hold cane with both hands. Move both arms from side to side, keep elbows straight.  Hold when stretch felt for __5__ seconds. Repeat __5-10__ times; __1-2__ times a day. Once this becomes easier progress to third picture bringing affected arm towards ear by staying out to side. Same hold for _5_seconds. Repeat  _5-10_ times, _1-2_ times/day.  SHOULDER: External Rotation - Supine (Cane)    Hold cane with both hands. Push  forearm away from body. Keep elbow on floor and next to body. _5-10__ reps per set, hold 5 seconds, _1-2__ sets per day. Add rolled washcloth or small towel to keep elbow at side.  Shoulder Blade Stretch    Clasp fingers behind head with elbows touching in front of face. Pull elbows back while pressing shoulder blades together. Relax and hold as tolerated, can place pillow under elbow here for comfort as needed and to allow for prolonged stretch.  Repeat __5__ times. Do __1-2__ sessions per day.

## 2020-01-03 NOTE — Therapy (Signed)
[place], [place], [address] Phone: [phone]   Fax:  [phone]  Physical Therapy Treatment  Patient Details  Name: Robin Anderson MRN: [id number] Date of Birth: 1941-01-13 Referring Provider (PT): Lindi Adie   Encounter Date: 01/03/2020   PT End of Session - 01/03/20 1045    Visit Number 8    Number of Visits 17    Date for PT Re-Evaluation 01/24/20    PT Start Time 1006    PT Stop Time 1106    PT Time Calculation (min) 60 min    Activity Tolerance Patient tolerated treatment well    Behavior During Therapy [facility] for tasks assessed/performed           Past Medical History:  Diagnosis Date  . Blood in stool   . Cancer [facility])    breast - left  . Cataracts, bilateral   . Chest pain, unspecified   . Family history of breast cancer   . Headache 07/02/2014  . Heart murmur   . Hepatitis   . History of colon polyps   . HLD (hyperlipidemia)   . HTN (hypertension)   . Prediabetes 07/28/2011   no meds, diet controlled  . Seizure disorder ([place])    seizures controlled with med  . Wears glasses     Past Surgical History:  Procedure Laterality Date  . ABDOMINAL HERNIA REPAIR     lower mid line hernia which has reherniatied  . BREAST BIOPSY Left 01/31/2019   Malignant  . BREAST LUMPECTOMY WITH RADIOACTIVE SEED AND SENTINEL LYMPH NODE BIOPSY N/A 09/05/2019   Procedure: RIGHT BREAST SEED LUMPECTOMY X 1, LEFT BREAST SEED LUMPECTOMY X 2, LEFT AXILLARY SENTINEL LYMPH NODE BIOPSY;  Surgeon: Erroll Luna, MD;  Location: [place];  Service: General;  Laterality: N/A;  . BUNIONECTOMY Right    traumatic injury to her foot that was repaired  . RE-EXCISION OF BREAST LUMPECTOMY Left 10/03/2019   Procedure: LEFT BREAST RE-EXCISION OF BREAST LUMPECTOMY;  Surgeon: Erroll Luna, MD;  Location: [place];  Service: General;  Laterality: Left;  . TONSILLECTOMY    . TUBAL LIGATION      There were no vitals  filed for this visit.   Subjective Assessment - 01/03/20 1011    Subjective I did those exercises she gave me last time and I felt a real good pull in my Lt armpit. Epsom salt is helping my fingers and wearing my compression bra.    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 Pt has a SLNB on the L with 2 nodes removed both negative. Pt reports that since her surgery on 09/05/19 she has had swelling in the LUE.  Pt completed radiation on 12/01/2019    Patient Stated Goals to get the pain and swelling gone and be able to use this hand    Currently in Pain? No/denies                       Outpatient [facility]  Lymphedema Life Impact Scale Total Score 44.12 %  [facility] Adult PT Treatment/Exercise - 01/03/20 0001      Shoulder Exercises: Supine   External Rotation AAROM;Left;5 reps   5 sec hold with dowel   Flexion AAROM;Both;5 reps   5 sec hold with dowel   ABduction AAROM;Left;5 reps   5 sec holds with dowel   ABduction Limitations Tactile cuing for correct UE postioning and technique    Other Supine Exercises Fingers clasped behind head for elbow abduction/adduction holding 5 sec, 5x returning demo      Manual Therapy   Manual Lymphatic Drainage (MLD) short neck, 5 diaphragmatic breaths, right axillary nodes, right intact upper quadrant sequence and establishment of anterior inter-axillary anastomosis, left inguinal nodes and establishment of left axillo-inguinal pathway, Lt breast moving fluid towards pathways then into Rt S/L for further work to lateral breast and redirecting  towards Lt axillo-inguinal and posterior inter-axillary anastomosis, then finished in supine retracing all steps     Passive ROM To Lt shoulder into flexion and abduction; then to L PIP and DIP joints, DIP joints                   PT Education - 01/03/20 1027    Education Details Reviewed supine dowel exercises and added er and fingers clasped behind head    Person(s) Educated Patient    Methods Explanation;Demonstration;Handout    Comprehension Verbalized understanding;Returned demonstration;Tactile cues required;Need further instruction               PT Long Term Goals - 12/27/19 1229      PT LONG TERM GOAL #1   Title Pt will demonstrate 155 degrees of bilateral shoulder flexion to allow her to reach overhead    Baseline R 142, L 137; R 145 and L 140 - 12/27/19    Status On-going      PT LONG TERM GOAL #2   Title Pt will demonstrate 155 degrees of bilateral shoulder abduction to allow pt to reach out to the side    Baseline R 143 L 126; R 137 and L 130 - 12/27/19    Status On-going      PT LONG TERM GOAL #3   Title Pt will report a 50% improvement in left breast swelling to allow improved comfort    Baseline Pt did not rate today but today is first time she is able to report some softening noted at superior aspect of breast-12/27/19    Status On-going      PT LONG TERM GOAL #4   Title Pt will report she is able to close her left fist without trouble due to decreased swelling to allow improved comfort    Baseline unable to close hand in a fist; pt has had min improvement with this as of late with continued P/ROM and epsom salt soaks-12/27/19    Status On-going      PT LONG TERM GOAL #5   Title Pt will obtain appropriate compression garments for long term management of LUE and L breast lymphedema to decrease risk of cellulitis    Baseline Pt has appt to be [facility] for compression bra tomorrow - 12/27/19    Status On-going      PT LONG TERM GOAL #6    Title Pt will be independent in a home exercise program for long term managment of lymphedema    Baseline Pt is independent with current HEP of self MLD-12/27/19    Status On-going  Plan - 01/03/20 1106    Clinical Impression Statement Reviewed supine dowel exercises issued to HEP and added er and fingers clasped behind head. She toelrated these well but required tactile cues for technique and will benefit from further review to assess technique. Also continued with manual lymph drainage to Lt breast. Increased fibrosis noticed at inferior aspect of breast today so focused time here. Pt reports this is where her tumor was and she had required second surgery for clear margins, so some of this is probable to be scar tissue. She sees Dr. Isidore Moos for follow up Friday and PT at last session sent inbox to Dr. Isidore Moos letting her know of possible seroma. Pt would like to come a few more visits but 1x/wk.    Personal Factors and Comorbidities Time since onset of injury/illness/exacerbation    Examination-Activity Limitations Lift;Dressing;Carry;Reach Overhead    Examination-Participation Restrictions Cleaning;Yard Work;Community Activity    Stability/Clinical Decision Making Evolving/Moderate complexity    Rehab Potential Good    PT Frequency 2x / week    PT Duration 4 weeks    PT Treatment/Interventions ADLs/Self Care Home Management;Therapeutic exercise;Patient/family education;Manual lymph drainage;Compression bandaging;Manual techniques;Scar mobilization;Passive range of motion;Vasopneumatic Device    PT Next Visit Plan see if dowel exercises are going ok,how was appt with Dr. Isidore Moos? see if wearing bra at night helped, continue ROM to L shoulder and MLD to L breast    PT Home Exercise Plan wear chip pack in bra; cont self MLD, supine dowel ex    Consulted and Agree with Plan of Care Patient           Patient will benefit from skilled therapeutic intervention in order to improve  the following deficits and impairments:  Decreased range of motion, Decreased scar mobility, Increased edema, Decreased knowledge of precautions, Decreased strength, Increased fascial restricitons, Impaired UE functional use, Postural dysfunction, Pain  Visit Diagnosis: Lymphedema, not elsewhere classified  Stiffness of left shoulder, not elsewhere classified  Abnormal posture  Stiffness of right shoulder, not elsewhere classified  Malignant neoplasm of lower-outer quadrant of right breast of female, estrogen receptor positive ([place])     Problem List Patient Active Problem List   Diagnosis Date Noted  . Malignant neoplasm of central portion of right breast in female, estrogen receptor positive ([place]) 09/20/2019  . Family history of breast cancer   . Malignant neoplasm of lower-outer quadrant of left breast of female, estrogen receptor positive ([place]) 02/17/2019  . Tremor 06/06/2018  . Headache 07/02/2014  . Tonic clonic seizures ([place]) 08/03/2013  . Glucose intolerance (pre-diabetes) 07/29/2011  . HYPOKALEMIA 11/26/2008  . HYPERLIPIDEMIA-MIXED 11/17/2008  . HYPERTENSION, UNSPECIFIED 11/17/2008  . CHEST PAIN-UNSPECIFIED 11/17/2008    Otelia Limes, PTA 01/03/2020, 11:11 AM  [place], [place], [address] Phone: [phone]   Fax:  [phone]  Name: Brittanie Dosanjh MRN: [id number] Date of Birth: [birth year]

## 2020-01-04 NOTE — Progress Notes (Signed)
[name] presents today for follow-up of radiation to her left breast completed on 12/01/2019  Pain: patient denies any pain to the breast, but does report occasional burning pain (usually at night) to the left hand Skin: patient reports residual hyperpigmentation to treatment field, but reports intact skin and denies any itching/irritation to skin. Swelling: Continued swelling/painful lump to breast that has not improved with physical therapy. ROM: Patient denies any issues in her left shoulder, but she continues to have difficulty making a fist/using her left hand. She also reports ongoing numbness to hand  MedOnc F/U: scheduled for Survivorship Care Plan with Mendel Ryder Causey-NP on 03/01/2020  Other concerns: Patient denies  Wt Readings from Last 3 Encounters:  01/05/20 136 lb 3.2 oz (61.8 kg)  12/01/19 138 lb 4.8 oz (62.7 kg)  10/25/19 141 lb 4 oz (64.1 kg)   Vitals:   01/05/20 1055  BP: (!) 159/76  Pulse: 89  Resp: 20  Temp: 98.4 F (36.9 C)  SpO2: 100%

## 2020-01-05 ENCOUNTER — Ambulatory Visit
Admission: RE | Admit: 2020-01-05 | Discharge: 2020-01-05 | Disposition: A | Discharge status: Home / Self Care | Payer: Medicare Other | Source: Ambulatory Visit | Attending: Radiation Oncology | Admitting: Radiation Oncology

## 2020-01-05 ENCOUNTER — Other Ambulatory Visit: Payer: Self-pay

## 2020-01-05 VITALS — BP 159/76 | HR 89 | Temp 98.4°F | Resp 20 | Ht 63.0 in | Wt 136.2 lb

## 2020-01-05 DIAGNOSIS — Z7982 Long term (current) use of aspirin: Secondary | ICD-10-CM | POA: Insufficient documentation

## 2020-01-05 DIAGNOSIS — Z923 Personal history of irradiation: Secondary | ICD-10-CM | POA: Diagnosis not present

## 2020-01-05 DIAGNOSIS — T148XXA Other injury of unspecified body region, initial encounter: Secondary | ICD-10-CM | POA: Insufficient documentation

## 2020-01-05 DIAGNOSIS — Z170 Estrogen receptor positive status [ER+]: Secondary | ICD-10-CM | POA: Diagnosis not present

## 2020-01-05 DIAGNOSIS — Z79899 Other long term (current) drug therapy: Secondary | ICD-10-CM | POA: Insufficient documentation

## 2020-01-05 DIAGNOSIS — C50512 Malignant neoplasm of lower-outer quadrant of left female breast: Secondary | ICD-10-CM | POA: Insufficient documentation

## 2020-01-05 NOTE — Progress Notes (Signed)
Radiation Oncology         [phone]) [phone] ________________________________  Name: Robin Anderson MRN: [id number]  Date: 01/05/2020  DOB: [birth year]  Follow-Up Visit Note  Outpatient  CC: Andree Moro, DO  Daye, Deneda T, FNP  Diagnosis and Prior Radiotherapy:    ICD-10-CM   1. Malignant neoplasm of lower-outer quadrant of left breast of female, estrogen receptor positive ([place])  C50.512    Z17.0     CHIEF COMPLAINT: Here for follow-up and surveillance of breast cancer  Narrative:  The patient returns today for routine follow-up.  She completed radiotherapy to her left breast 1 month ago.  Since surgery she has had a significant seroma of the left breast.  It has persisted despite aggressive physical therapy.  She also reports that since surgery she has trouble clenching her left hand into a fist.  She reports that her follow-up with Dr. Brantley Stage is not for a while from now.                              ALLERGIES:  is allergic to penicillins.  Meds: Current Outpatient Medications  Medication Sig Dispense Refill  . anastrozole (ARIMIDEX) 1 MG tablet Take 1 tablet (1 mg total) by mouth daily. 90 tablet 3  . aspirin EC 81 MG tablet Take 81 mg by mouth daily.    . benazepril-hydrochlorthiazide (LOTENSIN HCT) 20-25 MG tablet Take 1 tablet by mouth daily. 90 tablet 3  . calcium carbonate (CALCIUM 600) 600 MG TABS tablet Take 600 mg by mouth daily.    . Cholecalciferol (VITAMIN D3) 50 MCG (2000 UT) TABS Take 2,000 Units by mouth daily.    . primidone (MYSOLINE) 50 MG tablet TAKE 3 TABLETS BY MOUTH AT BEDTIME 270 tablet 3  . simvastatin (ZOCOR) 20 MG tablet TAKE 1 TABLET BY MOUTH EVERYDAY AT BEDTIME 90 tablet 2   No current facility-administered medications for this encounter.    Physical Findings: The patient is in no acute distress. Patient is alert and oriented.  height is 5\' 3"  (1.6 m) and weight is 136 lb 3.2 oz (61.8 kg). Her temperature is 98.4 F (36.9 C). Her blood pressure  is 159/76 (abnormal) and her pulse is 89. Her respiration is 20 and oxygen saturation is 100%. .    Satisfactory skin healing in radiotherapy fields.  She has a large firm seroma of the left breast.  No signs of cellulitis or excessive warmth of the left breast. MSK: She cannot form her left hand into a fist.   Lab Findings: Lab Results  Component Value Date   WBC 6.5 10/03/2019   HGB 12.4 10/03/2019   HCT 38.9 10/03/2019   MCV 88.4 10/03/2019   PLT 220 10/03/2019    Radiographic Findings: No results found.  Impression/Plan: Healing well from radiotherapy to the breast tissue.  She does have a bothersome large left seroma that persists despite physical therapy.  She is wondering if this can be drained to provide some symptomatic relief.  I will asked my staff to move up her appointment with Dr. Brantley Stage so she can be evaluated.  I told her not sure if he will recommended given that there is some risk of infection, and the fluid can certainly reaccumulate.  However, I do think it is worth looking into given her discomfort.  Continue skin care with topical Vitamin E Oil and / or lotion for at least 2 more months for  further healing.  Follow-up as scheduled with survivorship.  I will see her back on an as-needed basis. I have encouraged her to call if she has any issues or concerns in the future. I wished her the very best.  On date of service, in total, I spent 20 minutes on this encounter. Patient was seen in person.  _____________________________________   Eppie Gibson, MD

## 2020-01-08 ENCOUNTER — Telehealth: Payer: Self-pay | Admitting: *Deleted

## 2020-01-08 NOTE — Telephone Encounter (Signed)
CALLED PATIENT TO INFORM OF APPT. WITH DR. Brantley Stage ON 01-22-20 - ARRIVAL TIME- 10:15 AM, SPOKE WITH PATIENT AND SHE IS AWARE OF THIS APPT.

## 2020-01-09 ENCOUNTER — Ambulatory Visit: Payer: Self-pay | Admitting: Radiation Oncology

## 2020-01-15 ENCOUNTER — Ambulatory Visit: Payer: Medicare Other | Admitting: Neurology

## 2020-01-17 ENCOUNTER — Other Ambulatory Visit: Payer: Self-pay

## 2020-01-17 ENCOUNTER — Encounter: Payer: Self-pay | Admitting: Adult Health

## 2020-01-17 ENCOUNTER — Ambulatory Visit (INDEPENDENT_AMBULATORY_CARE_PROVIDER_SITE_OTHER): Payer: Medicare Other | Admitting: Adult Health

## 2020-01-17 VITALS — BP 130/70 | HR 87 | Ht 63.0 in | Wt 136.0 lb

## 2020-01-17 DIAGNOSIS — R251 Tremor, unspecified: Secondary | ICD-10-CM | POA: Diagnosis not present

## 2020-01-17 DIAGNOSIS — G40409 Other generalized epilepsy and epileptic syndromes, not intractable, without status epilepticus: Secondary | ICD-10-CM

## 2020-01-17 NOTE — Progress Notes (Signed)
I have read the note, and I agree with the clinical assessment and plan.  Kearia Yin K Skylin Kennerson   

## 2020-01-17 NOTE — Progress Notes (Signed)
PATIENT: [name] DOB: 07-21-1940  REASON FOR VISIT: follow up HISTORY FROM: patient  HISTORY OF PRESENT ILLNESS: Today 01/17/20:  Robin Anderson is a 79 year old female with a history of seizure disorder and essential tremor. She returns today for follow-up. Denies any seizures. Continues on primidone 50 mg 3 tablets at bedtime. Reports that she notices the tremor with handwriting. Feels that it might be worse. She had breast cancer had surgery in May and again in June. She is having swelling in the left breast. Will follow-up with surgeon on Monday. She returns today for follow-up.  HISTORY 07/12/19  Robin Anderson is a 79 year old female with history of seizure disorder and tremor in her right hand. She has previously been on Keppra, but stopped due to report of moles on her face and scalp. She remains on primidone for seizure prevention and tremor. Her last seizure occurred in 2018 while taking Keppra 250 mg in the morning, 500 mg in the evening. The seizure episode was described as having woken up in the morning, had bitten her lip. She is taking primidone 150 mg at bedtime. She has breast cancer, is on oral chemo.  She has not had recurrent seizure.  The tremor is improved with primidone in the right hand, it may increase during times of anxiety. When she takes the primidone at night, she may have a chalky taste in her mouth.  She does not drive a car.  She presents today for follow-up unaccompanied.  REVIEW OF SYSTEMS: Out of a complete 14 system review of symptoms, the patient complains only of the following symptoms, and all other reviewed systems are negative.  See HPI  ALLERGIES: Allergies  Allergen Reactions  . Penicillins Rash    HOME MEDICATIONS: Outpatient Medications Prior to Visit  Medication Sig Dispense Refill  . anastrozole (ARIMIDEX) 1 MG tablet Take 1 tablet (1 mg total) by mouth daily. 90 tablet 3  . aspirin EC 81 MG tablet Take 81 mg by mouth daily.    .  benazepril-hydrochlorthiazide (LOTENSIN HCT) 20-25 MG tablet Take 1 tablet by mouth daily. 90 tablet 3  . calcium carbonate (CALCIUM 600) 600 MG TABS tablet Take 600 mg by mouth daily.    . Cholecalciferol (VITAMIN D3) 50 MCG (2000 UT) TABS Take 2,000 Units by mouth daily.    . primidone (MYSOLINE) 50 MG tablet TAKE 3 TABLETS BY MOUTH AT BEDTIME 270 tablet 3  . simvastatin (ZOCOR) 20 MG tablet TAKE 1 TABLET BY MOUTH EVERYDAY AT BEDTIME 90 tablet 2   No facility-administered medications prior to visit.    PAST MEDICAL HISTORY: Past Medical History:  Diagnosis Date  . Blood in stool   . Cancer [facility])    breast - left  . Cataracts, bilateral   . Chest pain, unspecified   . Family history of breast cancer   . Headache 07/02/2014  . Heart murmur   . Hepatitis   . History of colon polyps   . HLD (hyperlipidemia)   . HTN (hypertension)   . Prediabetes 07/28/2011   no meds, diet controlled  . Seizure disorder ([place])    seizures controlled with med  . Wears glasses     PAST SURGICAL HISTORY: Past Surgical History:  Procedure Laterality Date  . ABDOMINAL HERNIA REPAIR     lower mid line hernia which has reherniatied  . BREAST BIOPSY Left 01/31/2019   Malignant  . BREAST LUMPECTOMY WITH RADIOACTIVE SEED AND SENTINEL LYMPH NODE BIOPSY N/A  09/05/2019   Procedure: RIGHT BREAST SEED LUMPECTOMY X 1, LEFT BREAST SEED LUMPECTOMY X 2, LEFT AXILLARY SENTINEL LYMPH NODE BIOPSY;  Surgeon: Erroll Luna, MD;  Location: [place];  Service: General;  Laterality: N/A;  . BUNIONECTOMY Right    traumatic injury to her foot that was repaired  . RE-EXCISION OF BREAST LUMPECTOMY Left 10/03/2019   Procedure: LEFT BREAST RE-EXCISION OF BREAST LUMPECTOMY;  Surgeon: Erroll Luna, MD;  Location: [place];  Service: General;  Laterality: Left;  . TONSILLECTOMY    . TUBAL LIGATION      FAMILY HISTORY: Family History  Problem Relation Age of Onset  . Hypertension Mother   . Heart attack Father   .  Hypertension Sister        5 sisters....3 have HTN  . Hypertension Brother   . Breast cancer Sister   . Heart attack Daughter     SOCIAL HISTORY: Social History   Socioeconomic History  . Marital status: Married    Spouse name: [name]  . Number of children: 6  . Years of education: 92  . Highest education level: Not on file  Occupational History  . Occupation: retired  Tobacco Use  . Smoking status: Former Smoker    Types: Cigarettes  . Smokeless tobacco: Never Used  . Tobacco comment: when she was young for a short period of time  Vaping Use  . Vaping Use: Never used  Substance and Sexual Activity  . Alcohol use: No    Alcohol/week: 0.0 standard drinks  . Drug use: No  . Sexual activity: Not on file  Other Topics Concern  . Not on file  Social History Narrative   Patient lives at home with her husband Enriqueta Augusta.   Retired.   Education high school.   Right handed..      Regular exercise-yes   Caffeine Use-yes   Social Determinants of Health   Financial Resource Strain:   . Difficulty of Paying Living Expenses: Not on file  Food Insecurity:   . Worried About Charity fundraiser in the Last Year: Not on file  . Ran Out of Food in the Last Year: Not on file  Transportation Needs: No Transportation Needs  . Lack of Transportation (Medical): No  . Lack of Transportation (Non-Medical): No  Physical Activity:   . Days of Exercise per Week: Not on file  . Minutes of Exercise per Session: Not on file  Stress:   . Feeling of Stress : Not on file  Social Connections:   . Frequency of Communication with Friends and Family: Not on file  . Frequency of Social Gatherings with Friends and Family: Not on file  . Attends Religious Services: Not on file  . Active Member of Clubs or Organizations: Not on file  . Attends Archivist Meetings: Not on file  . Marital Status: Not on file  Intimate Partner Violence: Not At Risk  . Fear of Current or Ex-Partner: No   . Emotionally Abused: No  . Physically Abused: No  . Sexually Abused: No      PHYSICAL EXAM  Vitals:   01/17/20 1054  BP: 130/70  Pulse: 87  Weight: 136 lb (61.7 kg)  Height: 5\' 3"  (1.6 m)   Body mass index is 24.09 kg/m.  Generalized: Well developed, in no acute distress   Neurological examination  Mentation: Alert oriented to time, place, history taking. Follows all commands speech and language fluent Cranial nerve II-XII: Pupils were equal round  reactive to light. Extraocular movements were full, visual field were full on confrontational test. Facial sensation and strength were normal. Uvula tongue midline. Head turning and shoulder shrug  were normal and symmetric. Motor: The motor testing reveals 5 over 5 strength of all 4 extremities. Good symmetric motor tone is noted throughout.  Sensory: Sensory testing is intact to soft touch on all 4 extremities. No evidence of extinction is noted.  Coordination: Cerebellar testing reveals good finger-nose-finger and heel-to-shin bilaterally.  Gait and station: Gait is normal.   Reflexes: Deep tendon reflexes are symmetric and normal bilaterally.   DIAGNOSTIC DATA (LABS, IMAGING, TESTING) - I reviewed patient records, labs, notes, testing and imaging myself where available.  Lab Results  Component Value Date   WBC 6.5 10/03/2019   HGB 12.4 10/03/2019   HCT 38.9 10/03/2019   MCV 88.4 10/03/2019   PLT 220 10/03/2019      Component Value Date/Time   NA 140 10/17/2019 1221   K 3.5 10/17/2019 1221   CL 99 10/17/2019 1221   CO2 29 10/17/2019 1221   GLUCOSE 74 10/17/2019 1221   GLUCOSE 110 (H) 10/03/2019 0612   BUN 11 10/17/2019 1221   CREATININE 0.96 10/17/2019 1221   CREATININE 0.95 (H) 09/11/2015 0944   CALCIUM 9.6 10/17/2019 1221   PROT 7.6 10/03/2019 0612   PROT 7.1 04/11/2019 1032   ALBUMIN 4.1 10/03/2019 0612   ALBUMIN 4.4 04/11/2019 1032   AST 17 10/03/2019 0612   ALT 12 10/03/2019 0612   ALKPHOS 87 10/03/2019  0612   BILITOT 0.7 10/03/2019 0612   BILITOT 0.3 04/11/2019 1032   GFRNONAA 58 (L) 10/17/2019 1221   GFRAA 66 10/17/2019 1221   Lab Results  Component Value Date   CHOL 172 04/11/2019   HDL 79 04/11/2019   LDLCALC 81 04/11/2019   TRIG 59 04/11/2019   CHOLHDL 2.2 04/11/2019   Lab Results  Component Value Date   HGBA1C 5.8 08/10/2012   No results found for: QBHALPFX90 Lab Results  Component Value Date   TSH 1.880 09/01/2017      ASSESSMENT AND PLAN 79 y.o. year old female  has a past medical history of Blood in stool, Cancer ([place]), Cataracts, bilateral, Chest pain, unspecified, Family history of breast cancer, Headache (07/02/2014), Heart murmur, Hepatitis, History of colon polyps, HLD (hyperlipidemia), HTN (hypertension), Prediabetes (07/28/2011), Seizure disorder ([place]), and Wears glasses. here with:  1: Seizures 2. Essential Tremor   - Continue Primidone 3 tablets daily- Ok to space out dosing to TID. Could be helpful for tremor.  - advised if symptoms worsen or she develops new symptoms she should let us know.  - FU in 1 year or sooner if needed.    I spent 25 minutes of face-to-face and non-face-to-face time with patient.  This included previsit chart review, lab review, study review, order entry, electronic health record documentation, patient education.  Ward Givens, MSN, NP-C 01/17/2020, 11:12 AM [facility] [address], [place], [place] [address] [phone]

## 2020-01-17 NOTE — Patient Instructions (Signed)
Your Plan:  Continue primidone- ok to take 1 tablet AM, noon and at bedtime. Instead of all at bedtime If your symptoms worsen or you develop new symptoms please let us know.    Thank you for coming to see [place] at [facility]. I hope we have been able to provide you high quality care today.  You may receive a patient satisfaction survey over the next few weeks. We would appreciate your feedback and comments so that we may continue to improve ourselves and the health of our patients.

## 2020-01-19 ENCOUNTER — Ambulatory Visit: Payer: Medicare Other | Attending: Hematology and Oncology

## 2020-01-19 ENCOUNTER — Other Ambulatory Visit: Payer: Self-pay

## 2020-01-19 DIAGNOSIS — M25612 Stiffness of left shoulder, not elsewhere classified: Secondary | ICD-10-CM | POA: Insufficient documentation

## 2020-01-19 DIAGNOSIS — I890 Lymphedema, not elsewhere classified: Secondary | ICD-10-CM | POA: Diagnosis not present

## 2020-01-19 DIAGNOSIS — M25611 Stiffness of right shoulder, not elsewhere classified: Secondary | ICD-10-CM

## 2020-01-19 DIAGNOSIS — R293 Abnormal posture: Secondary | ICD-10-CM | POA: Insufficient documentation

## 2020-01-19 DIAGNOSIS — Z170 Estrogen receptor positive status [ER+]: Secondary | ICD-10-CM | POA: Insufficient documentation

## 2020-01-19 DIAGNOSIS — C50511 Malignant neoplasm of lower-outer quadrant of right female breast: Secondary | ICD-10-CM | POA: Insufficient documentation

## 2020-01-19 NOTE — Therapy (Signed)
[place], [place], [address] Phone: [phone]   Fax:  [phone]  Physical Therapy Treatment  Patient Details  Name: Robin Anderson MRN: [id number] Date of Birth: 1941/04/13 Referring Provider (PT): Lindi Adie   Encounter Date: 01/19/2020   PT End of Session - 01/19/20 1102    Visit Number 9    Number of Visits 17    Date for PT Re-Evaluation 01/24/20    PT Start Time 7510    PT Stop Time 1102    PT Time Calculation (min) 60 min    Activity Tolerance Patient tolerated treatment well    Behavior During Therapy [facility] for tasks assessed/performed           Past Medical History:  Diagnosis Date  . Blood in stool   . Cancer [facility])    breast - left  . Cataracts, bilateral   . Chest pain, unspecified   . Family history of breast cancer   . Headache 07/02/2014  . Heart murmur   . Hepatitis   . History of colon polyps   . HLD (hyperlipidemia)   . HTN (hypertension)   . Prediabetes 07/28/2011   no meds, diet controlled  . Seizure disorder ([place])    seizures controlled with med  . Wears glasses     Past Surgical History:  Procedure Laterality Date  . ABDOMINAL HERNIA REPAIR     lower mid line hernia which has reherniatied  . BREAST BIOPSY Left 01/31/2019   Malignant  . BREAST LUMPECTOMY WITH RADIOACTIVE SEED AND SENTINEL LYMPH NODE BIOPSY N/A 09/05/2019   Procedure: RIGHT BREAST SEED LUMPECTOMY X 1, LEFT BREAST SEED LUMPECTOMY X 2, LEFT AXILLARY SENTINEL LYMPH NODE BIOPSY;  Surgeon: Erroll Luna, MD;  Location: [place];  Service: General;  Laterality: N/A;  . BUNIONECTOMY Right    traumatic injury to her foot that was repaired  . RE-EXCISION OF BREAST LUMPECTOMY Left 10/03/2019   Procedure: LEFT BREAST RE-EXCISION OF BREAST LUMPECTOMY;  Surgeon: Erroll Luna, MD;  Location: [place];  Service: General;  Laterality: Left;  . TONSILLECTOMY    . TUBAL LIGATION      There were no vitals  filed for this visit.   Subjective Assessment - 01/19/20 1006    Subjective I can move my fingers better with less pain now and I'm gonna keep working with them. I can also reach better with my Lt arm and I've been doing the exercises with the stick. My Lt breast just keeps not wanting to go down, but I go Monday to see Dr. Brantley Stage to see if he can drain my seroma I will say the heaviness and firmness has improved some now though from when we started.    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 Pt has a SLNB on the L with 2 nodes removed both negative. Pt reports that since her surgery on 09/05/19 she has had swelling in the LUE.  Pt completed radiation on 12/01/2019    Patient Stated Goals to get the pain and swelling gone and be able to use this hand    Currently in Pain? No/denies  [facility] PT Assessment - 01/19/20 0001      AROM   Left Shoulder Flexion 141 Degrees    Left Shoulder ABduction 142 Degrees                   Outpatient [facility]  Lymphedema Life Impact Scale Total Score 44.12 %            [facility] Adult PT Treatment/Exercise - 01/19/20 0001      Shoulder Exercises: Standing   Flexion AAROM;Left   3 reps in front of mirror   Flexion Limitations Demo, tactile cues to decrease Lt scapular compensation and performed with pt in front of mirror to instruct in correct technique    ABduction AAROM;Left   3 reps   ABduction Limitations same cuing as with flexion      Shoulder Exercises: Pulleys   Flexion 2 minutes    Flexion Limitations Pt returned theerapist  demo and tactile cues to decrease Lt scapular compensation    ABduction 2 minutes    ABduction Limitations Same cues as above and to decrease side trunk lean      Shoulder Exercises: Therapy Ball   Flexion Both;10 reps   forward lean into end of stretch   Flexion Limitations Demo and VCs for correct technique      Shoulder Exercises: Stretch   Wall Stretch - ABduction 3 reps    Wall Stretch - ABduction Limitations Tried using finger ladder but limited mobility of fingers prevented correct technique    Other Shoulder Stretches Tried modified downward dog on wall but limited Lt wrist mobility limited this      Manual Therapy   Manual Lymphatic Drainage (MLD) short neck, 5 diaphragmatic breaths, right axillary nodes, right intact upper quadrant sequence and establishment of anterior inter-axillary anastomosis, left inguinal nodes and establishment of left axillo-inguinal pathway, Lt breast moving fluid towards pathways then into Rt S/L for further work to lateral breast and redirecting towards Lt axillo-inguinal and posterior inter-axillary anastomosis, then finished in supine retracing all steps     Passive ROM To Lt shoulder into flexion, abduction, and er to pts tolerance                       PT Long Term Goals - 12/27/19 1229      PT LONG TERM GOAL #1   Title Pt will demonstrate 155 degrees of bilateral shoulder flexion to allow her to reach overhead    Baseline R 142, L 137; R 145 and L 140 - 12/27/19    Status On-going      PT LONG TERM GOAL #2   Title Pt will demonstrate 155 degrees of bilateral shoulder abduction to allow pt to reach out to the side    Baseline R 143 L 126; R 137 and L 130 - 12/27/19    Status On-going      PT LONG TERM GOAL #3   Title Pt will report a 50% improvement in left breast swelling to allow improved comfort    Baseline Pt did not rate today but today is first time she is able to report some softening noted at superior aspect of breast-12/27/19     Status On-going      PT LONG TERM GOAL #4   Title Pt will report she is able to close her left fist without trouble due to decreased swelling to allow improved comfort    Baseline unable  to close hand in a fist; pt has had min improvement with this as of late with continued P/ROM and epsom salt soaks-12/27/19    Status On-going      PT LONG TERM GOAL #5   Title Pt will obtain appropriate compression garments for long term management of LUE and L breast lymphedema to decrease risk of cellulitis    Baseline Pt has appt to be [facility] for compression bra tomorrow - 12/27/19    Status On-going      PT LONG TERM GOAL #6   Title Pt will be independent in a home exercise program for long term managment of lymphedema    Baseline Pt is independent with current HEP of self MLD-12/27/19    Status On-going                 Plan - 01/19/20 1103    Clinical Impression Statement Pt returns after about 2 weeks since last visit reporting since improvement in heaviness and firmness of Lt breast. This was also noticeable to this therapist since last seeing pt. The superior and medial aspect of breast is no longer firm, and some softness noted at lateral breast, but this is where seroma is mostly palpable when pt in Rt S/L, also when in supine directly posterior to areolar. Pt reports has an appt with Dr. Brantley Stage that Dr. Isidore Moos helped get scheduled, for this Monday to see if he needs to drain seroma. Pt has been compliant with HEP for Lt shoulder ROM and with wear of her compression bra.    Personal Factors and Comorbidities Time since onset of injury/illness/exacerbation    Examination-Activity Limitations Lift;Dressing;Carry;Reach Overhead    Examination-Participation Restrictions Cleaning;Yard Work;Community Activity    Stability/Clinical Decision Making Evolving/Moderate complexity    Rehab Potential Good    PT Frequency 2x / week    PT Duration 4 weeks    PT  Treatment/Interventions ADLs/Self Care Home Management;Therapeutic exercise;Patient/family education;Manual lymph drainage;Compression bandaging;Manual techniques;Scar mobilization;Passive range of motion;Vasopneumatic Device    PT Next Visit Plan how was appt with Dr. Brantley Stage?  continue ROM to L shoulder and MLD to L breast    PT Home Exercise Plan wear chip pack in bra; cont self MLD, supine dowel ex    Consulted and Agree with Plan of Care Patient           Patient will benefit from skilled therapeutic intervention in order to improve the following deficits and impairments:  Decreased range of motion, Decreased scar mobility, Increased edema, Decreased knowledge of precautions, Decreased strength, Increased fascial restricitons, Impaired UE functional use, Postural dysfunction, Pain  Visit Diagnosis: Lymphedema, not elsewhere classified  Stiffness of left shoulder, not elsewhere classified  Abnormal posture  Stiffness of right shoulder, not elsewhere classified  Malignant neoplasm of lower-outer quadrant of right breast of female, estrogen receptor positive ([place])     Problem List Patient Active Problem List   Diagnosis Date Noted  . Malignant neoplasm of central portion of right breast in female, estrogen receptor positive ([place]) 09/20/2019  . Family history of breast cancer   . Malignant neoplasm of lower-outer quadrant of left breast of female, estrogen receptor positive ([place]) 02/17/2019  . Tremor 06/06/2018  . Headache 07/02/2014  . Tonic clonic seizures ([place]) 08/03/2013  . Glucose intolerance (pre-diabetes) 07/29/2011  . HYPOKALEMIA 11/26/2008  . HYPERLIPIDEMIA-MIXED 11/17/2008  . HYPERTENSION, UNSPECIFIED 11/17/2008  . CHEST PAIN-UNSPECIFIED 11/17/2008    Otelia Limes, PTA 01/19/2020, 11:08 AM  [place], [place], [address] Phone: [phone]   Fax:  [phone]  Name:  Robin Anderson MRN: [id number] Date of Birth: July 20, 1940

## 2020-01-20 ENCOUNTER — Other Ambulatory Visit: Payer: Self-pay | Admitting: Nurse Practitioner

## 2020-01-20 NOTE — Progress Notes (Signed)
  Patient Name: Robin Anderson MRN: [id number] DOB: 01-03-1941 Referring Physician: Novella Rob Date of Service: 12/01/2019 [facility], [place]                                                        End Of Treatment Note  Diagnoses: C50.111-Malignant neoplasm of central portion of right female breast C50.512-Malignant neoplasm of lower-outer quadrant of left female breast Z17.0-Estrogen receptor positive status [ER+]  Cancer Staging: Cancer Staging Malignant neoplasm of central portion of right breast in female, estrogen receptor positive ([place]) Staging form: Breast, AJCC 8th Edition - Pathologic stage from 09/05/2018: No Stage Recommended (ypT0, pN0, cM0) - Signed by Gardenia Phlegm, NP on 09/20/2019 - Clinical stage from 03/10/2019: Stage IA (cT38mi, cN0, cM0, G1, ER+, PR+, HER2-) - Signed by Gardenia Phlegm, NP on 09/20/2019  Malignant neoplasm of lower-outer quadrant of left breast of female, estrogen receptor positive ([place]) Staging form: Breast, AJCC 8th Edition - Clinical stage from 02/17/2019: Stage IB (cT2, cN0, cM0, G2, ER+, PR+, HER2-) - Signed by Nicholas Lose, MD on 02/17/2019 - Pathologic stage from 09/05/2019: No Stage Recommended (ypT2, pN0, cM0, G2, ER+, PR+, HER2-) - Signed by Gardenia Phlegm, NP on 09/20/2019  Intent: Curative  Radiation Treatment Dates: 11/06/2019 through 12/01/2019 Site Technique Total Dose (Gy) Dose per Fx (Gy) Completed Fx Beam Energies  Breast, Left: Breast_Lt 3D 40.05/40.05 2.67 15/15 6X, 10X  Breast, Left: Breast_Lt_Bst 3D 10/10 2 5/5 6X, 10X  Breast, Right: Breast_Rt 3D 40.05/40.05 2.67 15/15 6X, 10X   Narrative: The patient tolerated radiation therapy relatively well.   Plan: The patient will follow-up with radiation oncology in 65mo  .  -----------------------------------  Eppie Gibson, MD

## 2020-01-22 DIAGNOSIS — I890 Lymphedema, not elsewhere classified: Secondary | ICD-10-CM | POA: Diagnosis not present

## 2020-01-22 DIAGNOSIS — C50912 Malignant neoplasm of unspecified site of left female breast: Secondary | ICD-10-CM | POA: Diagnosis not present

## 2020-01-25 ENCOUNTER — Ambulatory Visit: Payer: Medicare Other

## 2020-01-25 ENCOUNTER — Other Ambulatory Visit: Payer: Self-pay

## 2020-01-25 DIAGNOSIS — I890 Lymphedema, not elsewhere classified: Secondary | ICD-10-CM

## 2020-01-25 DIAGNOSIS — Z170 Estrogen receptor positive status [ER+]: Secondary | ICD-10-CM | POA: Diagnosis not present

## 2020-01-25 DIAGNOSIS — M25611 Stiffness of right shoulder, not elsewhere classified: Secondary | ICD-10-CM | POA: Diagnosis not present

## 2020-01-25 DIAGNOSIS — R293 Abnormal posture: Secondary | ICD-10-CM

## 2020-01-25 DIAGNOSIS — C50511 Malignant neoplasm of lower-outer quadrant of right female breast: Secondary | ICD-10-CM | POA: Diagnosis not present

## 2020-01-25 DIAGNOSIS — M25612 Stiffness of left shoulder, not elsewhere classified: Secondary | ICD-10-CM | POA: Diagnosis not present

## 2020-01-25 NOTE — Therapy (Signed)
Progress Note Reporting Period 11/23/19 to 01/25/20   See note below for Objective Data and Assessment of Progress/Goals.      [place], [place], [address] Phone: [phone]   Fax:  [phone]  Physical Therapy Treatment  Patient Details  Name: Robin Anderson MRN: [id number] Date of Birth: [birth year] Referring Provider (PT): Lindi Adie   Encounter Date: 01/25/2020   PT End of Session - 01/25/20 1237    Visit Number 10    Number of Visits 23    Date for PT Re-Evaluation 03/07/20    PT Start Time 0907    PT Stop Time 1008    PT Time Calculation (min) 61 min    Activity Tolerance Patient tolerated treatment well    Behavior During Therapy [facility] for tasks assessed/performed           Past Medical History:  Diagnosis Date  . Blood in stool   . Cancer [facility])    breast - left  . Cataracts, bilateral   . Chest pain, unspecified   . Family history of breast cancer   . Headache 07/02/2014  . Heart murmur   . Hepatitis   . History of colon polyps   . HLD (hyperlipidemia)   . HTN (hypertension)   . Prediabetes 07/28/2011   no meds, diet controlled  . Seizure disorder ([place])    seizures controlled with med  . Wears glasses     Past Surgical History:  Procedure Laterality Date  . ABDOMINAL HERNIA REPAIR     lower mid line hernia which has reherniatied  . BREAST BIOPSY Left 01/31/2019   Malignant  . BREAST LUMPECTOMY WITH RADIOACTIVE SEED AND SENTINEL LYMPH NODE BIOPSY N/A 09/05/2019   Procedure: RIGHT BREAST SEED LUMPECTOMY X 1, LEFT BREAST SEED LUMPECTOMY X 2, LEFT AXILLARY SENTINEL LYMPH NODE BIOPSY;  Surgeon: Erroll Luna, MD;  Location: [place];  Service: General;  Laterality: N/A;  . BUNIONECTOMY Right    traumatic injury to her foot that was repaired  . RE-EXCISION OF BREAST LUMPECTOMY Left 10/03/2019   Procedure: LEFT BREAST RE-EXCISION OF BREAST LUMPECTOMY;  Surgeon: Erroll Luna, MD;  Location: [place];  Service: General;  Laterality: Left;  . TONSILLECTOMY    . TUBAL LIGATION      There were no vitals filed for this visit.   Subjective Assessment - 01/25/20 0915    Subjective I saw Dr. Brantley Stage and he is going to wait a little longer to drain my seroma because he said with me having finished radiation so recently I'm at a higher risk of infection. He's going to wait to see me in about 3 months to reassess. My Lt hand is still my biggest issue/problem.    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 Pt has a SLNB on the L with 2 nodes removed both negative. Pt reports that since her surgery on 09/05/19 she has had swelling in the LUE.  Pt completed radiation on 12/01/2019    Patient Stated Goals to get the pain and swelling gone and be able to use this hand    Currently in Pain? No/denies  [facility] PT Assessment - 01/25/20 0001      AROM   Left Shoulder Flexion 144 Degrees    Left Shoulder ABduction 138 Degrees                   Outpatient Re[facility] from [facility]23/[facility]  Lymphedema Life Impact Scale Total Score 44.12 %            [facility] Adult PT Treatment/Exercise - 01/25/20 0001      Manual Therapy   Manual Therapy Soft tissue mobilization;Manual Lymphatic Drainage (MLD);Passive ROM    Soft tissue mobilization To Lt hand and wrist incorporating P/ROM of all hand and wrist with coconut oil; pt still with pain at end ROM of fingers    Manual Lymphatic Drainage (MLD) short neck, 5 diaphragmatic breaths, right axillary nodes, right intact upper quadrant sequence  and establishment of anterior inter-axillary anastomosis, left inguinal nodes and establishment of left axillo-inguinal pathway, Lt breast moving fluid towards pathways then into Rt S/L for further work to lateral breast and redirecting towards Lt axillo-inguinal and posterior inter-axillary anastomosis, then finished in supine retracing all steps     Passive ROM To Lt shoulder into flexion, abduction, and er to pts tolerance; to Lt hand and wrist during STM                       PT Long Term Goals - 01/25/20 1008      PT LONG TERM GOAL #3   Title Pt will report a 50% improvement in left breast swelling to allow improved comfort    Baseline Pt did not rate today but today is first time she is able to report some softening noted at superior aspect of breast-12/27/19;                 Plan - 01/25/20 1238    Clinical Impression Statement Pts Lt breast lymphedema is still present, though much improved. Seroma is still largely palpable posterior to areolar and inferior aspect of breast but all surrounding tissue is now supple and pt reports much less discomfort. She has been compliant with wearing her compression bra daily and sleeps with chip pack in different bra at night. Also is being consistent with daily self MLD. Her largest complaint now is her Lt hand/finger stiffness so discussed considering OT for this and pt is agreeable as well as [employer], PT. So will reach out to Dr. Brantley Stage to see if he is willing to send referral as he is last MD to assess pt. Plan to place pt on hold while she is getting OT if MD is agreeable to that.    Personal Factors and Comorbidities Time since onset of injury/illness/exacerbation    Examination-Activity Limitations Lift;Dressing;Carry;Reach Overhead    Examination-Participation Restrictions Cleaning;Yard Work;Community Activity    Stability/Clinical Decision Making Evolving/Moderate complexity    Rehab Potential Good    PT  Frequency 1x / week    PT Duration 6 weeks    PT Treatment/Interventions ADLs/Self Care Home Management;Therapeutic exercise;Patient/family education;Manual lymph drainage;Compression bandaging;Manual techniques;Scar mobilization;Passive range of motion;Vasopneumatic Device    PT Next Visit Plan Continue ROM to L shoulder and MLD to L breast; plan to place pt on hold once she begins OT for her Lt hand/fingers    PT Home Exercise Plan wear chip pack in bra; cont self MLD, supine dowel ex    Consulted and Agree with Plan of Care Patient  Patient will benefit from skilled therapeutic intervention in order to improve the following deficits and impairments:  Decreased range of motion, Decreased scar mobility, Increased edema, Decreased knowledge of precautions, Decreased strength, Increased fascial restricitons, Impaired UE functional use, Postural dysfunction, Pain  Visit Diagnosis: Lymphedema, not elsewhere classified  Stiffness of left shoulder, not elsewhere classified  Abnormal posture  Stiffness of right shoulder, not elsewhere classified  Malignant neoplasm of lower-outer quadrant of right breast of female, estrogen receptor positive ([place])     Problem List Patient Active Problem List   Diagnosis Date Noted  . Malignant neoplasm of central portion of right breast in female, estrogen receptor positive ([place]) 09/20/2019  . Family history of breast cancer   . Malignant neoplasm of lower-outer quadrant of left breast of female, estrogen receptor positive ([place]) 02/17/2019  . Tremor 06/06/2018  . Headache 07/02/2014  . 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 Glucose intolerance (pre-diabetes) 07/29/2011  . HYPOKALEMIA 11/26/2008  . HYPERLIPIDEMIA-MIXED 11/17/2008  . HYPERTENSION, UNSPECIFIED 11/17/2008  . CHEST PAIN-UNSPECIFIED 11/17/2008    Otelia Limes, PTA 01/25/2020, 12:53 PM  [place], [place], [address] Phone: [phone]   Fax:  [phone]  Name: Robin Anderson MRN: [id number] Date of Birth: [birth year]

## 2020-02-01 ENCOUNTER — Ambulatory Visit: Payer: Medicare Other

## 2020-02-02 ENCOUNTER — Encounter: Payer: Self-pay | Admitting: Physical Therapy

## 2020-02-02 ENCOUNTER — Ambulatory Visit: Payer: Medicare Other | Admitting: Physical Therapy

## 2020-02-02 ENCOUNTER — Other Ambulatory Visit: Payer: Self-pay

## 2020-02-02 DIAGNOSIS — I890 Lymphedema, not elsewhere classified: Secondary | ICD-10-CM | POA: Diagnosis not present

## 2020-02-02 DIAGNOSIS — M25612 Stiffness of left shoulder, not elsewhere classified: Secondary | ICD-10-CM | POA: Diagnosis not present

## 2020-02-02 DIAGNOSIS — C50511 Malignant neoplasm of lower-outer quadrant of right female breast: Secondary | ICD-10-CM | POA: Diagnosis not present

## 2020-02-02 DIAGNOSIS — Z170 Estrogen receptor positive status [ER+]: Secondary | ICD-10-CM | POA: Diagnosis not present

## 2020-02-02 DIAGNOSIS — M25611 Stiffness of right shoulder, not elsewhere classified: Secondary | ICD-10-CM | POA: Diagnosis not present

## 2020-02-02 DIAGNOSIS — R293 Abnormal posture: Secondary | ICD-10-CM

## 2020-02-02 NOTE — Therapy (Signed)
[place], [place], [address] Phone: [phone]   Fax:  [phone]  Physical Therapy Treatment  Patient Details  Name: Robin Anderson MRN: [id number] Date of Birth: [birth year] Referring Provider (PT): Lindi Adie   Encounter Date: 02/02/2020   PT End of Session - 02/02/20 1134    Visit Number 11    Number of Visits 23    Date for PT Re-Evaluation 03/07/20    PT Start Time 1000    PT Stop Time 1045    PT Time Calculation (min) 45 min    Activity Tolerance Patient tolerated treatment well    Behavior During Therapy [facility] for tasks assessed/performed           Past Medical History:  Diagnosis Date  . Blood in stool   . Cancer [facility])    breast - left  . Cataracts, bilateral   . Chest pain, unspecified   . Family history of breast cancer   . Headache 07/02/2014  . Heart murmur   . Hepatitis   . History of colon polyps   . HLD (hyperlipidemia)   . HTN (hypertension)   . Prediabetes 07/28/2011   no meds, diet controlled  . Seizure disorder ([place])    seizures controlled with med  . Wears glasses     Past Surgical History:  Procedure Laterality Date  . ABDOMINAL HERNIA REPAIR     lower mid line hernia which has reherniatied  . BREAST BIOPSY Left 01/31/2019   Malignant  . BREAST LUMPECTOMY WITH RADIOACTIVE SEED AND SENTINEL LYMPH NODE BIOPSY N/A 09/05/2019   Procedure: RIGHT BREAST SEED LUMPECTOMY X 1, LEFT BREAST SEED LUMPECTOMY X 2, LEFT AXILLARY SENTINEL LYMPH NODE BIOPSY;  Surgeon: Erroll Luna, MD;  Location: [place];  Service: General;  Laterality: N/A;  . BUNIONECTOMY Right    traumatic injury to her foot that was repaired  . RE-EXCISION OF BREAST LUMPECTOMY Left 10/03/2019   Procedure: LEFT BREAST RE-EXCISION OF BREAST LUMPECTOMY;  Surgeon: Erroll Luna, MD;  Location: [place];  Service: General;  Laterality: Left;  . TONSILLECTOMY    . TUBAL LIGATION      There were no vitals  filed for this visit.   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 Pt has a SLNB on the L with 2 nodes removed both negative. Pt reports that since her surgery on 09/05/19 she has had swelling in the LUE.  Pt completed radiation on 12/01/2019    Patient Stated Goals to get the pain and swelling gone and be able to use this hand    Currently in Pain? No/denies                       Outpatient Re[facility] from [facility]/23/2019 [facility]  Lymphedema Life Impact Scale Total Score 44.12 %            [facility] Adult PT Treatment/Exercise - 02/02/20 0001  Shoulder Exercises: Supine   Flexion AROM;Both;20 reps   with dowel    Other Supine Exercises circles with hand pointed to ceiling with therapist pinning breast tissue so that pt gets dynamic tissue stretch       Manual Therapy   Manual Therapy Edema management;Manual Lymphatic Drainage (MLD)    Edema Management gave pt 2 pieces of peach lined foam to wear inside compression bra and instructed pt to wear     Manual Lymphatic Drainage (MLD) short neck, 5 diaphragmatic breaths, right axillary nodes, right intact upper quadrant sequence and establishment of anterior inter-axillary anastomosis, left inguinal nodes and establishment of left axillo-inguinal pathway, Lt breast moving fluid towards pathways then into Rt S/L for further work to lateral breast and redirecting towards Lt axillo-inguinal and posterior inter-axillary  anastomosis, then finished in supine retracing all steps                        PT Long Term Goals - 01/25/20 1008      PT LONG TERM GOAL #1   Title Pt will demonstrate 155 degrees of bilateral shoulder flexion to allow her to reach overhead    Baseline R 142, L 137; R 145 and L 140 - 12/27/19    Time 4    Period Weeks      PT LONG TERM GOAL #2   Title Pt will demonstrate 155 degrees of bilateral shoulder abduction to allow pt to reach out to the side    Baseline R 143 L 126; R 137 and L 130 - 12/27/19    Time 4    Period Weeks    Status On-going      PT LONG TERM GOAL #3   Title Pt will report a 50% improvement in left breast swelling to allow improved comfort    Baseline Pt did not rate today but today is first time she is able to report some softening noted at superior aspect of breast-12/27/19;    Time 4    Period Weeks    Status On-going      PT LONG TERM GOAL #4   Title Pt will report she is able to close her left fist without trouble due to decreased swelling to allow improved comfort    Baseline unable to close hand in a fist; pt has had min improvement with this as of late with continued P/ROM and epsom salt soaks-12/27/19    Time 4    Status On-going      PT LONG TERM GOAL #5   Title Pt will obtain appropriate compression garments for long term management of LUE and L breast lymphedema to decrease risk of cellulitis    Baseline Pt has appt to be measured at [facility] for compression bra tomorrow - 12/27/19    Time 4    Period Weeks    Status On-going      PT LONG TERM GOAL #6   Title Pt will be independent in a home exercise program for long term managment of lymphedema    Baseline Pt is independent with current HEP of self MLD-12/27/19    Time 4    Period Weeks    Status On-going                 Plan - 02/02/20 1138    Clinical Impression Statement 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   She  admits to not wearing her compression bra much when she is at home so encouraged her to wear it all waking hours especially when she is moving her arm and at night as tolerated.  Tried a peach foam patch today as she said the chips are big.    Personal Factors and Comorbidities Time since onset of injury/illness/exacerbation    Examination-Activity Limitations Lift;Dressing;Carry;Reach Overhead    Rehab Potential Good    PT Frequency 1x / week    PT Duration 6 weeks    PT Treatment/Interventions ADLs/Self Care Home Management;Therapeutic exercise;Patient/family education;Manual lymph drainage;Compression bandaging;Manual techniques;Scar mobilization;Passive range of motion;Vasopneumatic Device    PT Next Visit Plan Continue ROM to L shoulder and MLD to L breast; plan to place pt on hold once she begins OT for her Lt hand/fingers           Patient will benefit from skilled therapeutic intervention in order to improve the following deficits and impairments:  Decreased range of motion, Decreased scar mobility, Increased edema, Decreased knowledge of precautions, Decreased strength, Increased fascial restricitons, Impaired UE functional use, Postural dysfunction, Pain  Visit Diagnosis: Lymphedema, not elsewhere classified  Stiffness of left shoulder, not elsewhere classified  Abnormal posture     Problem List Patient Active Problem List   Diagnosis Date Noted  . Malignant neoplasm of central portion of right breast in female, estrogen receptor positive ([place]) 09/20/2019  . Family history of breast cancer   . Malignant neoplasm of lower-outer quadrant of left breast of female, estrogen receptor positive ([place]) 02/17/2019  . Tremor 06/06/2018  . Headache 07/02/2014  . 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 Glucose intolerance (pre-diabetes) 07/29/2011  . HYPOKALEMIA 11/26/2008  . HYPERLIPIDEMIA-MIXED 11/17/2008  . HYPERTENSION, UNSPECIFIED 11/17/2008  . CHEST PAIN-UNSPECIFIED  11/17/2008   Donato Heinz. Owens Shark PT  Norwood Levo 02/02/2020, 11:41 AM  [place], [place], [address] Phone: [phone]   Fax:  [phone]  Name: Robin Anderson MRN: [id number] Date of Birth: 1940/10/26

## 2020-02-08 ENCOUNTER — Other Ambulatory Visit: Payer: Self-pay

## 2020-02-08 ENCOUNTER — Ambulatory Visit: Payer: Medicare Other

## 2020-02-08 DIAGNOSIS — I890 Lymphedema, not elsewhere classified: Secondary | ICD-10-CM | POA: Diagnosis not present

## 2020-02-08 DIAGNOSIS — M25611 Stiffness of right shoulder, not elsewhere classified: Secondary | ICD-10-CM

## 2020-02-08 DIAGNOSIS — M25612 Stiffness of left shoulder, not elsewhere classified: Secondary | ICD-10-CM | POA: Diagnosis not present

## 2020-02-08 DIAGNOSIS — R293 Abnormal posture: Secondary | ICD-10-CM

## 2020-02-08 DIAGNOSIS — Z170 Estrogen receptor positive status [ER+]: Secondary | ICD-10-CM | POA: Diagnosis not present

## 2020-02-08 DIAGNOSIS — C50511 Malignant neoplasm of lower-outer quadrant of right female breast: Secondary | ICD-10-CM

## 2020-02-08 NOTE — Therapy (Signed)
[facility] [address] [place], [place], [address] Phone: [phone]   Fax:  [phone]  Physical Therapy Treatment  Patient Details  Name: Robin Anderson MRN: [id number] Date of Birth: 06/09/1940 Referring Provider (PT): Gudena   Encounter Date: 02/08/2020   PT End of Session - 02/08/20 1113    Visit Number 12    Number of Visits 23    Date for PT Re-Evaluation 03/07/20    PT Start Time 1003    PT Stop Time 1109    PT Time Calculation (min) 66 min    Activity Tolerance Patient tolerated treatment well    Behavior During Therapy WFL for tasks assessed/performed           Past Medical History:  Diagnosis Date  . Blood in stool   . Cancer (HCC)    breast - left  . Cataracts, bilateral   . Chest pain, unspecified   . Family history of breast cancer   . Headache 07/02/2014  . Heart murmur   . Hepatitis   . History of colon polyps   . HLD (hyperlipidemia)   . HTN (hypertension)   . Prediabetes 07/28/2011   no meds, diet controlled  . Seizure disorder (HCC)    seizures controlled with med  . Wears glasses     Past Surgical History:  Procedure Laterality Date  . ABDOMINAL HERNIA REPAIR     lower mid line hernia which has reherniatied  . BREAST BIOPSY Left 01/31/2019   Malignant  . BREAST LUMPECTOMY WITH RADIOACTIVE SEED AND SENTINEL LYMPH NODE BIOPSY N/A 09/05/2019   Procedure: RIGHT BREAST SEED LUMPECTOMY X 1, LEFT BREAST SEED LUMPECTOMY X 2, LEFT AXILLARY SENTINEL LYMPH NODE BIOPSY;  Surgeon: [name], Thomas, MD;  Location: [facility] OR;  Service: General;  Laterality: N/A;  . BUNIONECTOMY Right    traumatic injury to her foot that was repaired  . RE-EXCISION OF BREAST LUMPECTOMY Left 10/03/2019   Procedure: LEFT BREAST RE-EXCISION OF BREAST LUMPECTOMY;  Surgeon: [name], Thomas, MD;  Location: [facility] OR;  Service: General;  Laterality: Left;  . TONSILLECTOMY    . TUBAL LIGATION      There were no vitals  filed for this visit.   Subjective Assessment - 02/08/20 1008    Subjective I've been wearing the new foam she gave me last time in my bra and my Lt breast really is getting softer and softer. I haven't heard anything aout OT, but I can move my fingers some better. I'm still soaking them in epsom salt.    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 Pt has a SLNB on the L with 2 nodes removed both negative. Pt reports that since her surgery on 09/05/19 she has had swelling in the LUE.  Pt completed radiation on 12/01/2019              [facility] PT Assessment - 02/08/20 0001      AROM   Right Shoulder Flexion 152 Degrees    Right Shoulder ABduction 157 Degrees    Left Shoulder Flexion 155 Degrees    Left Shoulder ABduction 157 Degrees                     [facility]  Lymphedema Life Impact Scale Total Score 44.12 %            [facility] Adult PT Treatment/Exercise - 02/08/20 0001      Shoulder Exercises: Pulleys   Flexion 2 minutes    Flexion Limitations Demo, tacitle and VCs to remind pt to stop compensating with Lt>Rt scapula    ABduction 2 minutes    ABduction Limitations Same cuing as above      Manual Therapy   Manual Lymphatic Drainage (MLD) short neck, 5 diaphragmatic breaths, right axillary nodes, right intact upper quadrant sequence and establishment of anterior inter-axillary anastomosis, left inguinal nodes and establishment of left axillo-inguinal pathway, Lt breast moving fluid towards pathways then into Rt S/L for further work to lateral breast and redirecting towards  Lt axillo-inguinal and posterior inter-axillary anastomosis, then finished in supine retracing all steps     Passive ROM To Lt shoulder into flexion and abduction to pts tolerance; pt still struggles with relaxing UE which mildly limits her end motion                       PT Long Term Goals - 02/08/20 1135      PT LONG TERM GOAL #1   Title Pt will demonstrate 155 degrees of bilateral shoulder flexion to allow her to reach overhead    Baseline R 142, L 137; R 145 and L 140 - 12/27/19; Rt 152 and Lt 155 degrees - 02/08/20    Status Partially Met      PT LONG TERM GOAL #2   Title Pt will demonstrate 155 degrees of bilateral shoulder abduction to allow pt to reach out to the side    Baseline R 143 L 126; R 137 and L 130 - 12/27/19; Rt 157 and Lt 157 degrees - 02/08/20    Status Achieved      PT LONG TERM GOAL #3   Title Pt will report a 50% improvement in left breast swelling to allow improved comfort    Baseline Pt did not rate today but today is first time she is able to report some softening noted at superior aspect of breast-12/27/19; pt feels this has improved 80-90% since start of care-02/08/20    Status Achieved      PT LONG TERM GOAL #4   Title Pt will report she is able to close her left fist without trouble due to decreased swelling to allow improved comfort    Baseline unable to close hand in a fist; pt has had min improvement with this as of late with continued P/ROM and epsom salt soaks-12/27/19; pt able to close fist ~50% now with partial finger flexion where there was none before, spoke with MD office and htey are going to refer her to OT-02/08/20    Status Partially Met      PT LONG TERM GOAL #5   Title Pt will obtain appropriate compression garments for long term management of LUE and L breast lymphedema to decrease risk of cellulitis    Baseline Pt has appt to be [facility] for compression bra tomorrow - 12/27/19; pt has received her compression bra  which she wears daily now with compression foam-02/08/20    Status Achieved                 Plan - 02/08/20 1124    Clinical Impression Statement Pt came in wearing peach  medi foam that was issued at last session and feels this was helpful. She has aso been trying to wear her compression bra more during day and some at night. Still with fibrotic lymphedema and seroma in Lt breast but this has overall improved some in surrounding breast tissue and pt feels this is 80-90% improved. She has mostly met her A/ROM goals, and has met all other goals except being able to make a fist.  She is going to be placed on hold at this time to pursue OT for her Lt hand.    Personal Factors and Comorbidities Time since onset of injury/illness/exacerbation    Examination-Activity Limitations Lift;Dressing;Carry;Reach Overhead    Examination-Participation Restrictions Cleaning;Yard Work;Community Activity    Stability/Clinical Decision Making Evolving/Moderate complexity    Rehab Potential Good    PT Frequency 1x / week    PT Duration 6 weeks    PT Treatment/Interventions ADLs/Self Care Home Management;Therapeutic exercise;Patient/family education;Manual lymph drainage;Compression bandaging;Manual techniques;Scar mobilization;Passive range of motion;Vasopneumatic Device    PT Next Visit Plan Pt on hold while pursuing OT for Lt hand stiffness/tightness. She may call to R/S for reassess once OT completed and will continue self MLD and weawring compression bra daily.    PT Home Exercise Plan wear chip pack in bra; cont self MLD, supine dowel ex    Consulted and Agree with Plan of Care Patient           Patient will benefit from skilled therapeutic intervention in order to improve the following deficits and impairments:  Decreased range of motion, Decreased scar mobility, Increased edema, Decreased knowledge of precautions, Decreased strength, Increased fascial restricitons, Impaired UE functional use, Postural  dysfunction, Pain  Visit Diagnosis: Lymphedema, not elsewhere classified  Stiffness of left shoulder, not elsewhere classified  Abnormal posture  Stiffness of right shoulder, not elsewhere classified  Malignant neoplasm of lower-outer quadrant of right breast of female, estrogen receptor positive (HCC)     Problem List Patient Active Problem List   Diagnosis Date Noted  . Malignant neoplasm of central portion of right breast in female, estrogen receptor positive (HCC) 09/20/2019  . Family history of breast cancer   . Malignant neoplasm of lower-outer quadrant of left breast of female, estrogen receptor positive (HCC) 02/17/2019  . Tremor 06/06/2018  . Headache 07/02/2014  . Tonic clonic seizures (HCC) 08/03/2013  . Glucose intolerance (pre-diabetes) 07/29/2011  . HYPOKALEMIA 11/26/2008  . HYPERLIPIDEMIA-MIXED 11/17/2008  . HYPERTENSION, UNSPECIFIED 11/17/2008  . CHEST PAIN-UNSPECIFIED 11/17/2008    Rosenberger, Valerie Ann, PTA 02/08/2020, 11:43 AM  [facility] [address] [place], [place], [address] Phone: [phone]   Fax:  [phone]  Name: Robin Anderson MRN: [id number] Date of Birth: 08/29/1940   

## 2020-02-27 ENCOUNTER — Encounter: Payer: Self-pay | Admitting: Occupational Therapy

## 2020-02-27 ENCOUNTER — Ambulatory Visit: Payer: Medicare Other | Attending: Hematology and Oncology | Admitting: Occupational Therapy

## 2020-02-27 ENCOUNTER — Other Ambulatory Visit: Payer: Self-pay

## 2020-02-27 DIAGNOSIS — M79642 Pain in left hand: Secondary | ICD-10-CM | POA: Diagnosis not present

## 2020-02-27 DIAGNOSIS — M6281 Muscle weakness (generalized): Secondary | ICD-10-CM

## 2020-02-27 DIAGNOSIS — M25542 Pain in joints of left hand: Secondary | ICD-10-CM | POA: Diagnosis not present

## 2020-02-27 DIAGNOSIS — M25632 Stiffness of left wrist, not elsewhere classified: Secondary | ICD-10-CM | POA: Diagnosis not present

## 2020-02-27 DIAGNOSIS — R278 Other lack of coordination: Secondary | ICD-10-CM

## 2020-02-27 DIAGNOSIS — M25642 Stiffness of left hand, not elsewhere classified: Secondary | ICD-10-CM | POA: Diagnosis not present

## 2020-02-27 DIAGNOSIS — M25342 Other instability, left hand: Secondary | ICD-10-CM

## 2020-02-27 NOTE — Therapy (Signed)
[facility] [address] [place], [place], [address] Phone: [phone]   Fax:  [phone]  Occupational Therapy Evaluation  Patient Details  Name: Robin Anderson MRN: [id number] Date of Birth: 07-06-1940 Referring Provider (OT): Erroll Luna, MD   Encounter Date: 02/27/2020   OT End of Session - 02/27/20 1224    Visit Number 1    Number of Visits 7    Date for OT Re-Evaluation 04/23/20    Authorization Type Medicare    Authorization - Visit Number 1    Authorization - Number of Visits 10    Progress Note Due on Visit 10    OT Start Time 8338    OT Stop Time 1231    OT Time Calculation (min) 46 min    Activity Tolerance Patient tolerated treatment well    Behavior During Therapy [facility] for tasks assessed/performed           Past Medical History:  Diagnosis Date  . Blood in stool   . Cancer [facility])    breast - left  . Cataracts, bilateral   . Chest pain, unspecified   . Family history of breast cancer   . Headache 07/02/2014  . Heart murmur   . Hepatitis   . History of colon polyps   . HLD (hyperlipidemia)   . HTN (hypertension)   . Prediabetes 07/28/2011   no meds, diet controlled  . Seizure disorder ([place])    seizures controlled with med  . Wears glasses     Past Surgical History:  Procedure Laterality Date  . ABDOMINAL HERNIA REPAIR     lower mid line hernia which has reherniatied  . BREAST BIOPSY Left 01/31/2019   Malignant  . BREAST LUMPECTOMY WITH RADIOACTIVE SEED AND SENTINEL LYMPH NODE BIOPSY N/A 09/05/2019   Procedure: RIGHT BREAST SEED LUMPECTOMY X 1, LEFT BREAST SEED LUMPECTOMY X 2, LEFT AXILLARY SENTINEL LYMPH NODE BIOPSY;  Surgeon: Erroll Luna, MD;  Location: [facility];  Service: General;  Laterality: N/A;  . BUNIONECTOMY Right    traumatic injury to her foot that was repaired  . RE-EXCISION OF BREAST LUMPECTOMY Left 10/03/2019   Procedure: LEFT BREAST RE-EXCISION OF BREAST  LUMPECTOMY;  Surgeon: Erroll Luna, MD;  Location: [facility];  Service: General;  Laterality: Left;  . TONSILLECTOMY    . TUBAL LIGATION      There were no vitals filed for this visit.   Subjective Assessment - 02/27/20 1153    Subjective  Pt reports left breast being swollen still and that she normally wears compression garment but is not today. Pt reports discharge from PT from cancer center at this time.    Limitations Active Breast Cancer.    Patient Stated Goals "I can't make a fist"    Currently in Pain? No/denies    Pain Score 8    when trying to make a fist   Pain Location Hand    Pain Orientation Left    Pain Descriptors / Indicators Discomfort    Pain Type Chronic pain    Pain Onset More than a month ago    Pain Frequency Constant    Aggravating Factors  making a fist    Pain Relieving Factors soaking in epsom salt             [facility] OT Assessment - 02/27/20 1155      Assessment   Medical Diagnosis Lt hand weakness    Referring Provider (OT) Erroll Luna, MD  Hand Dominance Right    Prior Therapy PT for lymphadema       Precautions   Precautions Other (comment)   Active Cancer     Balance Screen   Has the patient fallen in the past 6 months No      Home  Environment   Family/patient expects to be discharged to: Private residence    Living Arrangements Spouse/significant other    Type of [place] to live on main level with bedroom/bathroom    Bathroom TEFL teacher Yes    Lives With Spouse      Prior Function   Level of Independence Independent      ADL   Eating/Feeding Needs assist with cutting food    Grooming Modified independent    Upper Body Bathing Minimal assistance    Lower Body Bathing Modified independent    Upper Body Dressing Increased time    Lower Body Dressing Increased time    Toilet Transfer Modified independent    Toileting - Clothing Manipulation Increased time      Toileting -  Hygiene Increase time    Tub/Shower Transfer Supervision/safety    ADL comments pt reports independent with all ADLs but demonstrated increased time and min difficulty with zipper and fasteners at eval      IADL   Shopping Needs to be accompanied on any shopping trip    Light Housekeeping Does personal laundry completely;Maintains house alone or with occasional assistance    Meal Prep Plans, prepares and serves adequate meals independently    Prior Level of Function Community Mobility was not driving prior    Education officer, environmental on family or friends for transportation    Medication Management Is responsible for taking medication in correct dosages at correct time    Physiological scientist day-to-day purchases, but needs help with banking, major purchases, etc.      Written Expression   Dominant Hand Right      Vision - History   Baseline Vision Wears glasses only for reading    Additional Comments pt reports no changes in vision      Vision Assessment   Comment vision not assessed secondary to no deficits reported by patient      Cognition   Overall Cognitive Status History of cognitive impairments - at baseline      Sensation   Light Touch Appears Intact    Hot/Cold Appears Intact      Coordination   Gross Motor Movements are Fluid and Coordinated No    Fine Motor Movements are Fluid and Coordinated No    9 Hole Peg Test Right;Left    Right 9 Hole Peg Test 34.31    Left 9 Hole Peg Test 37.75    Box and Blocks RUE 47 LUE 40      ROM / Strength   AROM / PROM / Strength AROM;Strength      AROM   Overall AROM  Deficits    Overall AROM Comments RUE WFL, LUE WFL with exception of hand and wrist    AROM Assessment Site Wrist;Finger;Thumb    Right/Left Wrist Right    Right Wrist Extension 50 Degrees    Right Wrist Flexion 45 Degrees    Left Wrist Extension 30 Degrees    Left Wrist Flexion 30 Degrees    Right/Left Finger Left    Right/Left Thumb  Left      Strength  Overall Strength Deficits    Overall Strength Comments RUE WFL    Strength Assessment Site Shoulder;Elbow    Right/Left Shoulder Left    Left Shoulder Flexion 3+/5    Left Shoulder ABduction 3+/5    Left Shoulder Internal Rotation --    Left Shoulder External Rotation --    Right/Left Elbow Left    Left Elbow Flexion 3+/5    Left Elbow Extension 3+/5      Left Hand AROM   L Thumb MCP 0-60 60 Degrees    L Thumb IP 0-80 60 Degrees    L Index  MCP 0-90 60 Degrees    L Index PIP 0-100 70 Degrees    L Index DIP 0-70 45 Degrees      Hand Function   Right Hand Grip (lbs) 38.5    Left Hand Grip (lbs) 8.8    Comment composite flexion of approximately 50% in LUE                             OT Short Term Goals - 02/27/20 1723      OT SHORT TERM GOAL #1   Title Pt will be independent with HEP 03/26/20    Time 3    Period Weeks    Status New    Target Date 03/26/20      OT SHORT TERM GOAL #2   Title Pt will demonstrate composite flexion (fist) of approximately 75% to demonstrate improved range of motion    Time 3    Period Weeks    Status New      OT SHORT TERM GOAL #3   Title Pt will improve LUE grip strength by 3 lbs or more for increasing ease and independence with ADLs and IADLs    Baseline LUE 8.8 lbs    Time 3    Period Weeks    Status New      OT SHORT TERM GOAL #4   Title Pt will demonstrate improved wrist extension to 40 degrees in LUE    Baseline 30 degrees    Time 3    Period Weeks    Status New             OT Long Term Goals - 02/27/20 1726      OT LONG TERM GOAL #1   Title Pt will be independent with updated HEP 04/16/20    Time 6    Period Weeks    Status New    Target Date 04/16/20      OT LONG TERM GOAL #2   Title Pt will demonstrate wrist extension of at least 45 degrees in LUE    Baseline 30    Time 6    Period Weeks    Status New      OT LONG TERM GOAL #3   Title Pt will report pain no more  than 6/10 during active finger flexion for composite flexion in LUE    Baseline 8/10    Time 6    Period Weeks    Status New      OT LONG TERM GOAL #4   Title Pt will increase grip strength in LUE by at least 8 lbs for improving ease and comfort with performing ADLs and IADLs.    Baseline 8.8    Time 6    Period Weeks    Status New      OT LONG TERM GOAL #  5   Title Pt will report increased ease and manipulating fasteners and cutting up food with LUE.    Time 6    Period Weeks    Status New                 Plan - 02/27/20 1222    Clinical Impression Statement Pt presents to OPOT s/p  bilateral lumpectomies for treatment of L breast cancer (R was benign). Pt has a SLNB on the L with 2 nodes removed both negative. Pt reports that since her surgery on 09/05/19 she has had left upper extremity hand tightness and limited range of motion and weakness. Pt reports it is hard for her to close her hand in to a fist. Pt would benefit from skilled OT services to increase range of motion in LUE and increase strength and functional use.    OT Occupational Profile and History Problem Focused Assessment - Including review of records relating to presenting problem    Occupational performance deficits (Please refer to evaluation for details): IADL's;ADL's    Body Structure / Function / Physical Skills UE functional use;Tone;Pain;Strength;Dexterity;Flexibility;FMC;Coordination;ROM;IADL;ADL    Rehab Potential Fair    Clinical Decision Making Limited treatment options, no task modification necessary    Comorbidities Affecting Occupational Performance: May have comorbidities impacting occupational performance    Modification or Assistance to Complete Evaluation  No modification of tasks or assist necessary to complete eval    OT Frequency 1x / week    OT Duration 6 weeks    OT Treatment/Interventions DME and/or AE instruction;Manual lymph drainage;Self-care/ADL training;Moist  Heat;Fluidtherapy;Contrast Bath;Compression bandaging;Therapeutic activities;Therapeutic exercise;Ultrasound;Electrical Stimulation;Paraffin;Cryotherapy;Manual Therapy;Patient/family education;Passive range of motion    Plan hand AROM HEP for LUE    Consulted and Agree with Plan of Care Patient           Patient will benefit from skilled therapeutic intervention in order to improve the following deficits and impairments:   Body Structure / Function / Physical Skills: UE functional use, Tone, Pain, Strength, Dexterity, Flexibility, FMC, Coordination, ROM, IADL, ADL       Visit Diagnosis: Stiffness of left hand, not elsewhere classified - Plan: Ot plan of care cert/re-cert  Stiffness of left wrist, not elsewhere classified - Plan: Ot plan of care cert/re-cert  Other lack of coordination - Plan: Ot plan of care cert/re-cert  Muscle weakness (generalized) - Plan: Ot plan of care cert/re-cert  Pain in joint of left hand - Plan: Ot plan of care cert/re-cert  Pain in left hand - Plan: Ot plan of care cert/re-cert  Other instability, left hand - Plan: Ot plan of care cert/re-cert    Problem List Patient Active Problem List   Diagnosis Date Noted  . Malignant neoplasm of central portion of right breast in female, estrogen receptor positive ([place]) 09/20/2019  . Family history of breast cancer   . Malignant neoplasm of lower-outer quadrant of left breast of female, estrogen receptor positive ([place]) 02/17/2019  . Tremor 06/06/2018  . Headache 07/02/2014  . 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 Glucose intolerance (pre-diabetes) 07/29/2011  . HYPOKALEMIA 11/26/2008  . HYPERLIPIDEMIA-MIXED 11/17/2008  . HYPERTENSION, UNSPECIFIED 11/17/2008  . CHEST PAIN-UNSPECIFIED 11/17/2008    Zachery Conch MOT, OTR/L  02/27/2020, 5:32 PM  [facility] [address] [place], [place], [address] Phone: [phone]   Fax:   [phone]  Name: Robin Anderson MRN: [id number] Date of Birth: Aug 21, 1940

## 2020-02-29 DIAGNOSIS — Z1159 Encounter for screening for other viral diseases: Secondary | ICD-10-CM | POA: Diagnosis not present

## 2020-02-29 DIAGNOSIS — Z7189 Other specified counseling: Secondary | ICD-10-CM | POA: Diagnosis not present

## 2020-02-29 DIAGNOSIS — Z79899 Other long term (current) drug therapy: Secondary | ICD-10-CM | POA: Diagnosis not present

## 2020-02-29 DIAGNOSIS — R011 Cardiac murmur, unspecified: Secondary | ICD-10-CM | POA: Diagnosis not present

## 2020-02-29 DIAGNOSIS — Z0000 Encounter for general adult medical examination without abnormal findings: Secondary | ICD-10-CM | POA: Diagnosis not present

## 2020-02-29 DIAGNOSIS — I509 Heart failure, unspecified: Secondary | ICD-10-CM | POA: Diagnosis not present

## 2020-02-29 DIAGNOSIS — I110 Hypertensive heart disease with heart failure: Secondary | ICD-10-CM | POA: Diagnosis not present

## 2020-02-29 DIAGNOSIS — R29898 Other symptoms and signs involving the musculoskeletal system: Secondary | ICD-10-CM | POA: Diagnosis not present

## 2020-02-29 DIAGNOSIS — Z6823 Body mass index (BMI) 23.0-23.9, adult: Secondary | ICD-10-CM | POA: Diagnosis not present

## 2020-03-01 ENCOUNTER — Other Ambulatory Visit: Payer: Self-pay

## 2020-03-01 ENCOUNTER — Inpatient Hospital Stay: Payer: Medicare Other | Attending: Hematology and Oncology | Admitting: Adult Health

## 2020-03-01 ENCOUNTER — Encounter: Payer: Self-pay | Admitting: Adult Health

## 2020-03-01 VITALS — BP 150/63 | HR 67 | Temp 97.2°F | Resp 18 | Ht 63.0 in | Wt 135.1 lb

## 2020-03-01 DIAGNOSIS — Z170 Estrogen receptor positive status [ER+]: Secondary | ICD-10-CM

## 2020-03-01 DIAGNOSIS — C50111 Malignant neoplasm of central portion of right female breast: Secondary | ICD-10-CM

## 2020-03-01 DIAGNOSIS — C50512 Malignant neoplasm of lower-outer quadrant of left female breast: Secondary | ICD-10-CM | POA: Diagnosis not present

## 2020-03-01 DIAGNOSIS — Z79811 Long term (current) use of aromatase inhibitors: Secondary | ICD-10-CM | POA: Diagnosis not present

## 2020-03-01 MED ORDER — ANASTROZOLE 1 MG PO TABS: 1.0000 mg | ORAL_TABLET | Freq: Every day | ORAL | 4 refills | Status: DC

## 2020-03-01 NOTE — Progress Notes (Signed)
SURVIVORSHIP VISIT:    BRIEF ONCOLOGIC HISTORY:  Oncology History  Malignant neoplasm of lower-outer quadrant of left breast of female, estrogen receptor positive (HCC)  01/31/2019 Initial Diagnosis   Palpable lump in the left breast x4 months mammogram 01/27/2019: 2.5 cm mass at 6 o'clock position, 0.7 cm mass retroareolar, axilla negative: Grade 2 IDC ER 100%, PR 100%, HER-2 negative, Ki-67 15% T2N0 stage IB   02/17/2019 Cancer Staging   Staging form: Breast, AJCC 8th Edition - Clinical stage from 02/17/2019: Stage IB (cT2, cN0, cM0, G2, ER+, PR+, HER2-)   01/2019 -  Anti-estrogen oral therapy   Anastrozole   09/05/2019 Surgery   Bilateral lumpectomies ([name]) ([id number]):   Left: IDC, grade 2, 3.0cm, with intermediate grade DCIS, 2 left axillary lymph nodes negative.  Margins positive. Right: No residual carcinoma.  10/03/2019: Resection of margins ([name]) ([id number]): negative for malignancy.   09/05/2019 Cancer Staging   Staging form: Breast, AJCC 8th Edition - Pathologic stage from 09/05/2019: No Stage Recommended (ypT2, pN0, cM0, G2, ER+, PR+, HER2-)   11/06/2019 - 12/01/2019 Radiation Therapy   The patient initially received a dose of 40.05 Gy in 15 fractions to the RIGHT breast and 40.05 Gy in 15 fractions to the LEFT breast using whole-breast tangent fields. This was delivered using a 3-D conformal technique. The pt received a boost to the LEFT breast delivering an additional 10 Gy in 5 fractions using a electron boost with electrons. The total dose was 50.05 Gy.    Malignant neoplasm of central portion of right breast in female, estrogen receptor positive (HCC)  09/05/2018 Cancer Staging   Staging form: Breast, AJCC 8th Edition - Pathologic stage from 09/05/2018: No Stage Recommended (ypT0, pN0, cM0)   01/2019 -  Anti-estrogen oral therapy   Anastrozole   03/10/2019 Cancer Staging   Staging form: Breast, AJCC 8th Edition - Clinical stage from  03/10/2019: Stage IA (cT62mi, cN0, cM0, G1, ER+, PR+, HER2-)   09/05/2019 Surgery   Bilateral lumpectomies ([name]) ([id number]):   Left: IDC, grade 2, 3.0cm, with intermediate grade DCIS, 2 left axillary lymph nodes negative.  Margins positive. Right: No residual carcinoma.  10/03/2019: Resection of margins ([name]) ([id number]): negative for malignancy.   09/20/2019 Initial Diagnosis   Malignant neoplasm of central portion of right breast in female, estrogen receptor positive (HCC)   11/06/2019 - 12/01/2019 Radiation Therapy   The patient initially received a dose of 40.05 Gy in 15 fractions to the RIGHT breast and 40.05 Gy in 15 fractions to the LEFT breast using whole-breast tangent fields. This was delivered using a 3-D conformal technique. The pt received a boost to the LEFT breast delivering an additional 10 Gy in 5 fractions using a electron boost with electrons. The total dose was 50.05 Gy.      INTERVAL HISTORY:  Robin Anderson to review her survivorship care plan detailing her treatment course for breast cancer, as well as monitoring long-term side effects of that treatment, education regarding health maintenance, screening, and overall wellness and health promotion.     Overall, Robin Anderson reports feeling quite well.  She has some residual swelling of her left breast and weakness in her left hand since her surgery.  She has been to PT and has been wearing a compression bra to help with the swelling.  She notes her breast is much improved.    REVIEW OF SYSTEMS:  Review of Systems  Constitutional: Negative for appetite change, chills, fatigue, fever and unexpected weight change.  HENT:   Negative for hearing loss, lump/mass and trouble swallowing.   Eyes: Negative for eye problems and icterus.  Respiratory: Negative for chest tightness, cough and shortness of breath.   Cardiovascular: Negative for chest pain, leg swelling and palpitations.  Gastrointestinal: Negative for  abdominal distention, abdominal pain, constipation, diarrhea, nausea and vomiting.  Endocrine: Negative for hot flashes.  Genitourinary: Negative for difficulty urinating.   Musculoskeletal: Negative for arthralgias.  Skin: Negative for itching and rash.  Neurological: Positive for extremity weakness (left arm, chronic). Negative for dizziness, headaches and numbness.  Hematological: Negative for adenopathy. Does not bruise/bleed easily.  Psychiatric/Behavioral: Negative for depression. The patient is not nervous/anxious.    Breast: Denies any new nodularity, masses, tenderness, nipple changes, or nipple discharge.      ONCOLOGY TREATMENT TEAM:  1. Surgeon:  Dr. Brantley Stage at [facility] 2. Medical Oncologist: Dr. Lindi Adie  3. Radiation Oncologist: Dr. Isidore Moos    PAST MEDICAL/SURGICAL HISTORY:  Past Medical History:  Diagnosis Date  . Blood in stool   . Cancer [facility])    breast - left  . Cataracts, bilateral   . Chest pain, unspecified   . Family history of breast cancer   . Headache 07/02/2014  . Heart murmur   . Hepatitis   . History of colon polyps   . HLD (hyperlipidemia)   . HTN (hypertension)   . Prediabetes 07/28/2011   no meds, diet controlled  . Seizure disorder ([facility])    seizures controlled with med  . Wears glasses    Past Surgical History:  Procedure Laterality Date  . ABDOMINAL HERNIA REPAIR     lower mid line hernia which has reherniatied  . BREAST BIOPSY Left 01/31/2019   Malignant  . BREAST LUMPECTOMY WITH RADIOACTIVE SEED AND SENTINEL LYMPH NODE BIOPSY N/A 09/05/2019   Procedure: RIGHT BREAST SEED LUMPECTOMY X 1, LEFT BREAST SEED LUMPECTOMY X 2, LEFT AXILLARY SENTINEL LYMPH NODE BIOPSY;  Surgeon: Erroll Luna, MD;  Location: [facility];  Service: General;  Laterality: N/A;  . BUNIONECTOMY Right    traumatic injury to her foot that was repaired  . RE-EXCISION OF BREAST LUMPECTOMY Left 10/03/2019   Procedure: LEFT BREAST RE-EXCISION OF BREAST  LUMPECTOMY;  Surgeon: Erroll Luna, MD;  Location: [facility];  Service: General;  Laterality: Left;  . TONSILLECTOMY    . TUBAL LIGATION       ALLERGIES:  Allergies  Allergen Reactions  . Penicillins Rash     CURRENT MEDICATIONS:  Outpatient Encounter Medications as of 03/01/2020  Medication Sig Note  . anastrozole (ARIMIDEX) 1 MG tablet Take 1 tablet (1 mg total) by mouth daily.   Marland Kitchen aspirin EC 81 MG tablet Take 81 mg by mouth daily.   . benazepril-hydrochlorthiazide (LOTENSIN HCT) 20-25 MG tablet TAKE 1 TABLET BY MOUTH EVERY DAY   . calcium carbonate (CALCIUM 600) 600 MG TABS tablet Take 600 mg by mouth daily.   . Cholecalciferol (VITAMIN D3) 50 MCG (2000 UT) TABS Take 2,000 Units by mouth daily.   . primidone (MYSOLINE) 50 MG tablet TAKE 3 TABLETS BY MOUTH AT BEDTIME 01/25/2020: Pt reports now taking 1 pill, 3x/day (morn, noon and night)  . simvastatin (ZOCOR) 20 MG tablet TAKE 1 TABLET BY MOUTH EVERYDAY AT BEDTIME    No facility-administered encounter medications on file as of 03/01/2020.     ONCOLOGIC FAMILY HISTORY:  Family History  Problem Relation Age of Onset  . Hypertension Mother   . Heart attack Father   .  Hypertension Sister        5 sisters....3 have HTN  . Hypertension Brother   . Breast cancer Sister   . Heart attack Daughter      GENETIC COUNSELING/TESTING: Not at this time  SOCIAL HISTORY:  Social History   Socioeconomic History  . Marital status: Married    Spouse name: [name]  . Number of children: 6  . Years of education: 90  . Highest education level: Not on file  Occupational History  . Occupation: retired  Tobacco Use  . Smoking status: Former Smoker    Types: Cigarettes  . Smokeless tobacco: Never Used  . Tobacco comment: when she was young for a short period of time  Vaping Use  . Vaping Use: Never used  Substance and Sexual Activity  . Alcohol use: No    Alcohol/week: 0.0 standard drinks  . Drug use: No  . Sexual activity: Not  on file  Other Topics Concern  . Not on file  Social History Narrative   Patient lives at home with her husband Maesyn Anderson.   Retired.   Education high school.   Right handed..      Regular exercise-yes   Caffeine Use-yes   Social Determinants of Health   Financial Resource Strain:   . Difficulty of Paying Living Expenses: Not on file  Food Insecurity:   . Worried About Charity fundraiser in the Last Year: Not on file  . Ran Out of Food in the Last Year: Not on file  Transportation Needs:   . Lack of Transportation (Medical): Not on file  . Lack of Transportation (Non-Medical): Not on file  Physical Activity:   . Days of Exercise per Week: Not on file  . Minutes of Exercise per Session: Not on file  Stress:   . Feeling of Stress : Not on file  Social Connections:   . Frequency of Communication with Friends and Family: Not on file  . Frequency of Social Gatherings with Friends and Family: Not on file  . Attends Religious Services: Not on file  . Active Member of Clubs or Organizations: Not on file  . Attends Archivist Meetings: Not on file  . Marital Status: Not on file  Intimate Partner Violence:   . Fear of Current or Ex-Partner: Not on file  . Emotionally Abused: Not on file  . Physically Abused: Not on file  . Sexually Abused: Not on file     OBSERVATIONS/OBJECTIVE:  BP (!) 150/63   Pulse 67   Temp (!) 97.2 F (36.2 C) (Tympanic)   Resp 18   Ht $R'5\' 3"'Bn$  (1.6 m)   Wt 135 lb 1.6 oz (61.3 kg)   LMP  (LMP Unknown)   SpO2 100%   BMI 23.93 kg/m  GENERAL: Patient is a well appearing female in no acute distress HEENT:  Sclerae anicteric.  Mask in place.  Neck is supple.  NODES:  No cervical, supraclavicular, or axillary lymphadenopathy palpated.  BREAST EXAM:  S/p bilateral lumpectomies, right breast is benign, left breast with large seroma, no sign of local recurrence.   LUNGS:  Clear to auscultation bilaterally.  No wheezes or rhonchi. HEART:   Regular rate and rhythm. No murmur appreciated. ABDOMEN:  Soft, nontender.  Positive, normoactive bowel sounds. No organomegaly palpated. MSK:  No focal spinal tenderness to palpation. Full range of motion bilaterally in the upper extremities. EXTREMITIES:  No peripheral edema.   SKIN:  Clear with no obvious rashes or  skin changes. No nail dyscrasia. NEURO:  Nonfocal. Well oriented.  Appropriate affect.    LABORATORY DATA:  None for this visit.  DIAGNOSTIC IMAGING:  None for this visit.      ASSESSMENT AND PLAN:  Robin Anderson is a pleasant 79 y.o. female with bilateral breast cancer, ER+/PR+/HER2-, diagnosed in 01/2019, treated with lumpectomies, adjuvant radiation therapy, and anti-estrogen therapy with Anastrozole beginning in 01/2019.  She presents to the Survivorship Clinic for our initial meeting and routine follow-up post-completion of treatment for breast cancer.    1. Bilateral breast cancer:  Robin Anderson is continuing to recover from definitive treatment for breast cancer. She will follow-up with her medical oncologist, Dr. Pamelia Hoit in 6 months with history and physical exam per surveillance protocol.  She will continue her anti-estrogen therapy with Anastrozole. Thus far, she is tolerating the Anastrozole well, with minimal side effects. She was instructed to make Dr. Pamelia Hoit or myself aware if she begins to experience any worsening side effects of the medication and I could see her back in clinic to help manage those side effects, as needed. Her mammogram is due 05/2020; orders placed today. Today, a comprehensive survivorship care plan and treatment summary was reviewed with the patient today detailing her breast cancer diagnosis, treatment course, potential late/long-term effects of treatment, appropriate follow-up care with recommendations for the future, and patient education resources.  A copy of this summary, along with a letter will be sent to the patient's primary care provider via  mail/fax/In Basket message after today's visit.    2 Bone health:  Given Robin Anderson's age/history of breast cancer and her current treatment regimen including anti-estrogen therapy with Anastrozole, she is at risk for bone demineralization.  Her DEXA scan is due 03/08/2020..  In the meantime, she was encouraged to increase her consumption of foods rich in calcium, as well as increase her weight-bearing activities.  She was given education on specific activities to promote bone health.  3. Cancer screening:  Due to Robin Anderson's history and her age, she should receive screening for skin cancers, colon cancer, and gynecologic cancers.  The information and recommendations are listed on the patient's comprehensive care plan/treatment summary and were reviewed in detail with the patient.    4. Health maintenance and wellness promotion: Robin Anderson was encouraged to consume 5-7 servings of fruits and vegetables per day. We reviewed the "Nutrition Rainbow" handout, as well as the handout "Take Control of Your Health and Reduce Your Cancer Risk" from the American Cancer Society.  She was also encouraged to engage in moderate to vigorous exercise for 30 minutes per day most days of the week. We discussed the [employer] fitness program, which is designed for cancer survivors to help them become more physically fit after cancer treatments.  She was instructed to limit her alcohol consumption and continue to abstain from tobacco use.     5. Support services/counseling: It is not uncommon for this period of the patient's cancer care trajectory to be one of many emotions and stressors.  We discussed how this can be increasingly difficult during the times of quarantine and social distancing due to the COVID-19 pandemic.   She was given information regarding our available services and encouraged to contact me with any questions or for help enrolling in any of our support group/programs.    Follow up instructions:     -Return to cancer center in 4 months for f/u with Dr. Pamelia Hoit  -Mammogram due in 05/2020 -Follow up  with surgery per Dr. Brantley Stage -She is welcome to return back to the Survivorship Clinic at any time; no additional follow-up needed at this time.  -Consider referral back to survivorship as a long-term survivor for continued surveillance  The patient was provided an opportunity to ask questions and all were answered. The patient agreed with the plan and demonstrated an understanding of the instructions.   Total encounter time: 30 minutes  Wilber Bihari, NP 03/01/20 12:32 PM Medical Oncology and Hematology [facility] [place], [place] [address] Tel. [phone]    Fax. [phone]  *Total Encounter Time as defined by the Centers for Medicare and Medicaid Services includes, in addition to the face-to-face time of a patient visit (documented in the note above) non-face-to-face time: obtaining and reviewing outside history, ordering and reviewing medications, tests or procedures, care coordination (communications with other health care professionals or caregivers) and documentation in the medical record.

## 2020-03-04 ENCOUNTER — Telehealth: Payer: Self-pay | Admitting: Hematology and Oncology

## 2020-03-04 NOTE — Telephone Encounter (Signed)
Scheduled appts per 11/12 los. Pt confirmed appt date and time.  

## 2020-03-08 ENCOUNTER — Ambulatory Visit
Admission: RE | Admit: 2020-03-08 | Discharge: 2020-03-08 | Disposition: A | Discharge status: Home / Self Care | Payer: Medicare Other | Source: Ambulatory Visit | Attending: General Practice | Admitting: General Practice

## 2020-03-08 ENCOUNTER — Other Ambulatory Visit: Payer: Self-pay

## 2020-03-08 DIAGNOSIS — Z780 Asymptomatic menopausal state: Secondary | ICD-10-CM | POA: Diagnosis not present

## 2020-03-08 DIAGNOSIS — M85851 Other specified disorders of bone density and structure, right thigh: Secondary | ICD-10-CM | POA: Diagnosis not present

## 2020-03-08 DIAGNOSIS — E2839 Other primary ovarian failure: Secondary | ICD-10-CM

## 2020-03-19 ENCOUNTER — Ambulatory Visit: Payer: Medicare Other | Admitting: Occupational Therapy

## 2020-03-25 NOTE — Progress Notes (Signed)
CARDIOLOGY OFFICE NOTE  Date:  04/08/2020    Robin Anderson Date of Birth: 10/20/1940 Medical Record #[id number]  PCP:  Andree Moro, DO  Cardiologist:  Servando Snare   Chief Complaint  Patient presents with  . Follow-up    History of Present Illness: Robin Anderson is a 79 y.o. female who presents today for a follow up visit.  She is a former patient of Dr. Winnifred Friar. Was to establish with Dr. Stanford Breed but never did. She has seen me over the past several years.    She has had atypical chest pain, HTN, HLD and glucose intolerance. Other issues as noted below and include seizure disorder. Remote normal Myoview from 2004 noted.    Has seen me over the past several years - had stopped her Tegretol at a prior visit and I advised her to alert neurology. She has tended to stop medicines on her own and we have added back and made adjustments as needed. She had been found to have breast cancer. Was on anti-estrogen therapy and then would have surgery followed by XRT.  Last seen in June - ok from our standpoint.    Comes in today. Here alone. Doing ok - but lots of stress - her son died last month from cancer. An aunt  just passed (she was 105) - her service is later today. BP up just a bit. She is out of Lotensin HCT - had been out about 3 days - took "her old medicine since she was out". She says she is alright. No chest pain. Not short of breath. She has had recent labs with her PCP last month - I am not able to see those.  She feels like overall, she is doing ok.   Past Medical History:  Diagnosis Date  . Blood in stool   . Cancer [facility])    breast - left  . Cataracts, bilateral   . Chest pain, unspecified   . Family history of breast cancer   . Headache 07/02/2014  . Heart murmur   . Hepatitis   . History of colon polyps   . HLD (hyperlipidemia)   . HTN (hypertension)   . Prediabetes 07/28/2011   no meds, diet controlled  . Seizure disorder ([place])    seizures controlled with  med  . Wears glasses     Past Surgical History:  Procedure Laterality Date  . ABDOMINAL HERNIA REPAIR     lower mid line hernia which has reherniatied  . BREAST BIOPSY Left 01/31/2019   Malignant  . BREAST LUMPECTOMY WITH RADIOACTIVE SEED AND SENTINEL LYMPH NODE BIOPSY N/A 09/05/2019   Procedure: RIGHT BREAST SEED LUMPECTOMY X 1, LEFT BREAST SEED LUMPECTOMY X 2, LEFT AXILLARY SENTINEL LYMPH NODE BIOPSY;  Surgeon: Erroll Luna, MD;  Location: [facility];  Service: General;  Laterality: N/A;  . BUNIONECTOMY Right    traumatic injury to her foot that was repaired  . RE-EXCISION OF BREAST LUMPECTOMY Left 10/03/2019   Procedure: LEFT BREAST RE-EXCISION OF BREAST LUMPECTOMY;  Surgeon: Erroll Luna, MD;  Location: [facility];  Service: General;  Laterality: Left;  . TONSILLECTOMY    . TUBAL LIGATION       Medications: Current Meds  Medication Sig  . anastrozole (ARIMIDEX) 1 MG tablet Take 1 tablet (1 mg total) by mouth daily.  Marland Kitchen aspirin EC 81 MG tablet Take 81 mg by mouth daily.  . calcium carbonate (OS-CAL) 600 MG TABS tablet Take 600 mg by mouth daily.  Marland Kitchen  Cholecalciferol (VITAMIN D3) 50 MCG (2000 UT) TABS Take 2,000 Units by mouth daily.  . primidone (MYSOLINE) 50 MG tablet TAKE 3 TABLETS BY MOUTH AT BEDTIME  . simvastatin (ZOCOR) 20 MG tablet TAKE 1 TABLET BY MOUTH EVERYDAY AT BEDTIME  . [DISCONTINUED] benazepril-hydrochlorthiazide (LOTENSIN HCT) 20-25 MG tablet TAKE 1 TABLET BY MOUTH EVERY DAY     Allergies: Allergies  Allergen Reactions  . Penicillins Rash    Social History: The patient  reports that she has quit smoking. Her smoking use included cigarettes. She has never used smokeless tobacco. She reports that she does not drink alcohol and does not use drugs.   Family History: The patient's family history includes Breast cancer in her sister; Heart attack in her daughter and father; Hypertension in her brother, mother, and sister.   Review of Systems: Please see the history  of present illness.   All other systems are reviewed and negative.   Physical Exam: VS:  BP 140/70   Pulse 82   Ht 5\' 4"  (1.626 m)   Wt 134 lb (60.8 kg)   LMP  (LMP Unknown)   SpO2 99%   BMI 23.00 kg/m  .  BMI Body mass index is 23 kg/m.  Wt Readings from Last 3 Encounters:  04/08/20 134 lb (60.8 kg)  03/01/20 135 lb 1.6 oz (61.3 kg)  01/17/20 136 lb (61.7 kg)    General: Pleasant. Alert and in no acute distress. Her weight is down from 139# at my last visit in June.   Cardiac: Regular rate and rhythm. No murmurs, rubs, or gallops. No edema.  Respiratory:  Lungs are clear to auscultation bilaterally with normal work of breathing.  GI: Soft and nontender.  MS: No deformity or atrophy. Gait and ROM intact.  Skin: Warm and dry. Color is normal.  Neuro:  Strength and sensation are intact and no gross focal deficits noted.  Psych: Alert, appropriate and with normal affect.   LABORATORY DATA:  EKG:  EKG is ordered today.  Personally reviewed by me. This demonstrates NSR.  Lab Results  Component Value Date   WBC 6.5 10/03/2019   HGB 12.4 10/03/2019   HCT 38.9 10/03/2019   PLT 220 10/03/2019   GLUCOSE 74 10/17/2019   CHOL 172 04/11/2019   TRIG 59 04/11/2019   HDL 79 04/11/2019   LDLCALC 81 04/11/2019   ALT 12 10/03/2019   AST 17 10/03/2019   NA 140 10/17/2019   K 3.5 10/17/2019   CL 99 10/17/2019   CREATININE 0.96 10/17/2019   BUN 11 10/17/2019   CO2 29 10/17/2019   TSH 1.880 09/01/2017   HGBA1C 5.8 08/10/2012     BNP (last 3 results) No results for input(s): BNP in the last 8760 hours.  ProBNP (last 3 results) No results for input(s): PROBNP in the last 8760 hours.   Other Studies Reviewed Today:     ASSESSMENT & PLAN:     1. HTN - BP up some - she is out of her Lotensin HCT - restarted today.   2. Breast cancer - seeing surgeon next month.   3. HLD - on statin - she reports she has had recent labs.   4. Seizure disorder - not discussed.   5.  History of asymptomatic bradycardia - HR is fine today.      6. History of asymptomatic bradycardia - not noted today. HR is fine.   Current medicines are reviewed with the patient today.  The patient does not  have concerns regarding medicines other than what has been noted above.  The following changes have been made:  See above.  Labs/ tests ordered today include:    Orders Placed This Encounter  Procedures  . EKG 12-Lead     Disposition:   FU with Dr. Johney Frame in 6 months. She is aware that I am leaving in February.    Patient is agreeable to this plan and will call if any problems develop in the interim.   SignedTruitt Merle, NP  04/08/2020 10:36 AM  [facility] [address] [place], [place]  [address] Phone: [phone] Fax: [phone]

## 2020-03-26 ENCOUNTER — Encounter: Payer: Medicare Other | Admitting: Occupational Therapy

## 2020-04-02 ENCOUNTER — Ambulatory Visit: Payer: Medicare Other | Admitting: Occupational Therapy

## 2020-04-03 NOTE — Therapy (Signed)
[facility] [address] [place], [place], [address] Phone: [phone]   Fax:  [phone]  Patient Details  Name: Robin Anderson MRN: [id number] Date of Birth: 1941-03-17 Referring Provider:  Erroll Luna, MD  Encounter Date: 02/27/2020   OCCUPATIONAL THERAPY DISCHARGE SUMMARY  Visits from Start of Care: 1  Current functional level related to goals / functional outcomes: Pt did not return after evaluation secondary to death in the family.    Remaining deficits: No treatment sessions completed   Education / Equipment: N/A  Plan: Patient agrees to discharge.  Patient goals were not met. Patient is being discharged due to not returning since the last visit.  ?????         Zachery Conch MOT, OTR/L  04/03/2020, 12:17 PM  [facility] [address] [place], [place], [address] Phone: [phone]   Fax:  [phone]

## 2020-04-08 ENCOUNTER — Ambulatory Visit (INDEPENDENT_AMBULATORY_CARE_PROVIDER_SITE_OTHER): Payer: Medicare Other | Admitting: Nurse Practitioner

## 2020-04-08 ENCOUNTER — Other Ambulatory Visit: Payer: Self-pay

## 2020-04-08 ENCOUNTER — Encounter: Payer: Self-pay | Admitting: Nurse Practitioner

## 2020-04-08 VITALS — BP 140/70 | HR 82 | Ht 64.0 in | Wt 134.0 lb

## 2020-04-08 DIAGNOSIS — I10 Essential (primary) hypertension: Secondary | ICD-10-CM

## 2020-04-08 MED ORDER — BENAZEPRIL-HYDROCHLOROTHIAZIDE 20-25 MG PO TABS: 1.0000 | ORAL_TABLET | Freq: Every day | ORAL | 3 refills | Status: DC

## 2020-04-08 NOTE — Patient Instructions (Addendum)
After Visit Summary:  We will be checking the following labs today - NONE   Medication Instructions:    Continue with your current medicines.   I refilled your BP medicine today - get back on that one.    If you need a refill on your cardiac medications before your next appointment, please call your pharmacy.     Testing/Procedures To Be Arranged:  N/A  Follow-Up:   See Dr. Johney Frame in 6 months.     At [facility], you and your health needs are our priority.  As part of our continuing mission to provide you with exceptional heart care, we have created designated Provider Care Teams.  These Care Teams include your primary Cardiologist (physician) and Advanced Practice Providers (APPs -  Physician Assistants and Nurse Practitioners) who all work together to provide you with the care you need, when you need it.  Special Instructions:  . Stay safe, wash your hands for at least 20 seconds and wear a mask when needed.  . It was good to talk with you today.    Call the [place] office at [phone] if you have any questions, problems or concerns.

## 2020-04-09 ENCOUNTER — Ambulatory Visit: Payer: Medicare Other | Admitting: Occupational Therapy

## 2020-04-16 ENCOUNTER — Encounter: Payer: Medicare Other | Admitting: Occupational Therapy

## 2020-04-17 DIAGNOSIS — Z23 Encounter for immunization: Secondary | ICD-10-CM | POA: Diagnosis not present

## 2020-04-23 ENCOUNTER — Encounter: Payer: Medicare Other | Admitting: Occupational Therapy

## 2020-05-14 DIAGNOSIS — Z0001 Encounter for general adult medical examination with abnormal findings: Secondary | ICD-10-CM | POA: Diagnosis not present

## 2020-05-14 DIAGNOSIS — D259 Leiomyoma of uterus, unspecified: Secondary | ICD-10-CM | POA: Diagnosis not present

## 2020-05-14 DIAGNOSIS — G319 Degenerative disease of nervous system, unspecified: Secondary | ICD-10-CM | POA: Diagnosis not present

## 2020-05-14 DIAGNOSIS — C50512 Malignant neoplasm of lower-outer quadrant of left female breast: Secondary | ICD-10-CM | POA: Diagnosis not present

## 2020-05-14 DIAGNOSIS — Z7189 Other specified counseling: Secondary | ICD-10-CM | POA: Diagnosis not present

## 2020-05-14 DIAGNOSIS — G40409 Other generalized epilepsy and epileptic syndromes, not intractable, without status epilepticus: Secondary | ICD-10-CM | POA: Diagnosis not present

## 2020-05-14 DIAGNOSIS — I509 Heart failure, unspecified: Secondary | ICD-10-CM | POA: Diagnosis not present

## 2020-05-14 DIAGNOSIS — G250 Essential tremor: Secondary | ICD-10-CM | POA: Diagnosis not present

## 2020-05-14 DIAGNOSIS — E785 Hyperlipidemia, unspecified: Secondary | ICD-10-CM | POA: Diagnosis not present

## 2020-05-14 DIAGNOSIS — Z79899 Other long term (current) drug therapy: Secondary | ICD-10-CM | POA: Diagnosis not present

## 2020-05-14 DIAGNOSIS — J309 Allergic rhinitis, unspecified: Secondary | ICD-10-CM | POA: Diagnosis not present

## 2020-05-14 DIAGNOSIS — I110 Hypertensive heart disease with heart failure: Secondary | ICD-10-CM | POA: Diagnosis not present

## 2020-05-15 ENCOUNTER — Other Ambulatory Visit: Payer: Self-pay | Admitting: Nurse Practitioner

## 2020-05-17 DIAGNOSIS — D0511 Intraductal carcinoma in situ of right breast: Secondary | ICD-10-CM | POA: Diagnosis not present

## 2020-05-17 DIAGNOSIS — R29898 Other symptoms and signs involving the musculoskeletal system: Secondary | ICD-10-CM | POA: Diagnosis not present

## 2020-05-17 DIAGNOSIS — C50912 Malignant neoplasm of unspecified site of left female breast: Secondary | ICD-10-CM | POA: Diagnosis not present

## 2020-07-01 NOTE — Progress Notes (Signed)
Patient Care Team: Andree Moro, DO as PCP - General (General Practice) Mauro Kaufmann, RN as Oncology Nurse Navigator Rockwell Germany, RN as Oncology Nurse Navigator Eppie Gibson, MD as Attending Physician (Radiation Oncology) Erroll Luna, MD as Consulting Physician (General Surgery) Nicholas Lose, MD as Consulting Physician (Hematology and Oncology)  DIAGNOSIS:    ICD-10-CM   1. Malignant neoplasm of lower-outer quadrant of left breast of female, estrogen receptor positive ([place])  C50.512    Z17.0     SUMMARY OF ONCOLOGIC HISTORY: Oncology History  Malignant neoplasm of lower-outer quadrant of left breast of female, estrogen receptor positive ([place])  01/31/2019 Initial Diagnosis   Palpable lump in the left breast x4 months mammogram 01/27/2019: 2.5 cm mass at 6 o'clock position, 0.7 cm mass retroareolar, axilla negative: Grade 2 IDC ER 100%, PR 100%, HER-2 negative, Ki-67 15% T2N0 stage IB   02/17/2019 Cancer Staging   Staging form: Breast, AJCC 8th Edition - Clinical stage from 02/17/2019: Stage IB (cT2, cN0, cM0, G2, ER+, PR+, HER2-)   01/2019 -  Anti-estrogen oral therapy   Anastrozole   09/05/2019 Surgery   Bilateral lumpectomies ([name]) ([id number]):   Left: IDC, grade 2, 3.0cm, with intermediate grade DCIS, 2 left axillary lymph nodes negative.  Margins positive. Right: No residual carcinoma.  10/03/2019: Resection of margins ([name]) ([id number]): negative for malignancy.   09/05/2019 Cancer Staging   Staging form: Breast, AJCC 8th Edition - Pathologic stage from 09/05/2019: No Stage Recommended (ypT2, pN0, cM0, G2, ER+, PR+, HER2-)   11/06/2019 - 12/01/2019 Radiation Therapy   The patient initially received a dose of 40.05 Gy in 15 fractions to the RIGHT breast and 40.05 Gy in 15 fractions to the LEFT breast using whole-breast tangent fields. This was delivered using a 3-D conformal technique. The pt received a boost to the LEFT breast delivering an  additional 10 Gy in 5 fractions using a electron boost with 17meV electrons. The total dose was 50.05 Gy.    Malignant neoplasm of central portion of right breast in female, estrogen receptor positive ([place])  09/05/2018 Cancer Staging   Staging form: Breast, AJCC 8th Edition - Pathologic stage from 09/05/2018: No Stage Recommended (ypT0, pN0, cM0)   01/2019 -  Anti-estrogen oral therapy   Anastrozole   03/10/2019 Cancer Staging   Staging form: Breast, AJCC 8th Edition - Clinical stage from 03/10/2019: Stage IA (cT82mi, cN0, cM0, G1, ER+, PR+, HER2-)   09/05/2019 Surgery   Bilateral lumpectomies ([name]) ([id number]):   Left: IDC, grade 2, 3.0cm, with intermediate grade DCIS, 2 left axillary lymph nodes negative.  Margins positive. Right: No residual carcinoma.  10/03/2019: Resection of margins ([name]) ([id number]): negative for malignancy.   09/20/2019 Initial Diagnosis   Malignant neoplasm of central portion of right breast in female, estrogen receptor positive ([place])   11/06/2019 - 12/01/2019 Radiation Therapy   The patient initially received a dose of 40.05 Gy in 15 fractions to the RIGHT breast and 40.05 Gy in 15 fractions to the LEFT breast using whole-breast tangent fields. This was delivered using a 3-D conformal technique. The pt received a boost to the LEFT breast delivering an additional 10 Gy in 5 fractions using a electron boost with 68meV electrons. The total dose was 50.05 Gy.      CHIEF COMPLIANT: Follow-up of left breast cancer on anastrozole  INTERVAL HISTORY: Robin Anderson is a 80 y.o. with above-mentioned history of left breast cancerwho wason neoadjuvant antiestrogen therapy with anastrozole, underwent a left lumpectomy,  radiation, and is currently on antiestrogen therapy with anastrozole.Bone density scan on 03/08/20 showed osteopenia with a T-score of -1.3 at the right femoral neck. She presents to the clinic today for follow-up.  She continues to have  problems with left breast swelling and lymphedema.  She is set up for a mammogram coming up. Unfortunately her son passed away from cancer earlier in the year.  ALLERGIES:  is allergic to penicillins.  MEDICATIONS:  Current Outpatient Medications  Medication Sig Dispense Refill  . anastrozole (ARIMIDEX) 1 MG tablet Take 1 tablet (1 mg total) by mouth daily. 90 tablet 4  . aspirin EC 81 MG tablet Take 81 mg by mouth daily.    . benazepril-hydrochlorthiazide (LOTENSIN HCT) 20-25 MG tablet Take 1 tablet by mouth daily. 90 tablet 3  . calcium carbonate (OS-CAL) 600 MG TABS tablet Take 600 mg by mouth daily.    . Cholecalciferol (VITAMIN D3) 50 MCG (2000 UT) TABS Take 2,000 Units by mouth daily.    . primidone (MYSOLINE) 50 MG tablet TAKE 3 TABLETS BY MOUTH AT BEDTIME 270 tablet 3  . simvastatin (ZOCOR) 20 MG tablet TAKE 1 TABLET BY MOUTH EVERYDAY AT BEDTIME 90 tablet 2   No current facility-administered medications for this visit.    PHYSICAL EXAMINATION: ECOG PERFORMANCE STATUS: 1 - Symptomatic but completely ambulatory  Vitals:   07/02/20 1016  BP: (!) 149/69  Pulse: 82  Resp: 16  Temp: (!) 97.5 F (36.4 C)  SpO2: 100%   Filed Weights   07/02/20 1016  Weight: 130 lb (59 kg)    BREAST: Left breast lymphedema with hardening of the breast and thickening.  (exam performed in the presence of a chaperone)  LABORATORY DATA:  I have reviewed the data as listed CMP Latest Ref Rng & Units 10/17/2019 10/03/2019 09/01/2019  Glucose 65 - 99 mg/dL 74 110(H) 94  BUN 8 - 27 mg/dL $Remove'11 11 13  'lfJRmoZ$ Creatinine 0.57 - 1.00 mg/dL 0.96 1.02(H) 1.06(H)  Sodium 134 - 144 mmol/L 140 137 138  Potassium 3.5 - 5.2 mmol/L 3.5 3.0(L) 3.6  Chloride 96 - 106 mmol/L 99 99 100  CO2 20 - 29 mmol/L $RemoveB'29 28 29  'RGLNyvWY$ Calcium 8.7 - 10.3 mg/dL 9.6 9.6 9.6  Total Protein 6.5 - 8.1 g/dL - 7.6 7.2  Total Bilirubin 0.3 - 1.2 mg/dL - 0.7 0.7  Alkaline Phos 38 - 126 U/L - 87 83  AST 15 - 41 U/L - 17 19  ALT 0 - 44 U/L - 12 13     Lab Results  Component Value Date   WBC 6.5 10/03/2019   HGB 12.4 10/03/2019   HCT 38.9 10/03/2019   MCV 88.4 10/03/2019   PLT 220 10/03/2019   NEUTROABS 4.6 10/03/2019    ASSESSMENT & PLAN:  Malignant neoplasm of lower-outer quadrant of left breast of female, estrogen receptor positive ([place]) 01/31/2019:Palpable lump in the left breast x4 months mammogram 01/27/2019: 2.5 cm mass at 6 o'clock position, 0.7 cm mass retroareolar, axilla negative: Grade 2 IDC ER 100%, PR 100%, HER-2 negative, Ki-67 15%  T2N0 stage IB 03/10/2019: Biopsy of the right breast: DCIS, 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Neoadjuvant antiestrogen therapy anastrozole 1 mg daily 02/17/2019 2.Breast conserving surgery5/18/2021 3. Adjuvant radiation therapy followed by(patient is also not keen on adjuvant radiation) 4.Continuation of adjuvant antiestrogen therapy --------------------------------------------------------------------------------------------------------------------------- Current treatment: Started as neoadjuvant therapy with anastrozole 1 mg daily started 02/17/2019, now on adjuvant anastrozole Anastrozole toxicities:Tolerating it well   09/05/2019:Left lumpectomy ([name]):  IDC, grade 2, 3.0cm, with intermediate grade DCIS, 2 left axillary lymph nodes negative.Invasive carcinoma present posterior margin focally, inferior margin focally, anterior superior margin broadly and anterior margin broadly. Right lumpectomy: Benign 10/03/2019: Resection of the margins: Negative for malignancy.  Adjuvant radiation 11/07/2019-12/01/2019  Palpable abnormality in the left breast: It is very difficult to examine her because of extensive scarring and possibly seromas in the breast.  She is scheduled for a mammogram coming up.  We will also request for ultrasound. She is very concerned about breast cancer recurrence given the nature of the palpable abnormality.  We will await the results of this  test.  Breast cancer surveillance: 1.  Breast exam 07/02/2020: Left breast: Large palpable abnormality that is freely mobile in the left breast. 2. mammogram scheduled for 07/04/2020  If the mammogram ultrasound are negative for recurrence then she can return to clinic in 1 year for follow-up    No orders of the defined types were placed in this encounter.  The patient has a good understanding of the overall plan. she agrees with it. she will call with any problems that may develop before the next visit here.  Total time spent: 20 mins including face to face time and time spent for planning, charting and coordination of care  Rulon Eisenmenger, MD, MPH 07/02/2020  I, Molly Dorshimer, am acting as scribe for Dr. Nicholas Lose.  I have reviewed the above documentation for accuracy and completeness, and I agree with the above.

## 2020-07-02 ENCOUNTER — Inpatient Hospital Stay: Payer: Medicare Other | Attending: Hematology and Oncology | Admitting: Hematology and Oncology

## 2020-07-02 ENCOUNTER — Other Ambulatory Visit: Payer: Self-pay

## 2020-07-02 DIAGNOSIS — Z79811 Long term (current) use of aromatase inhibitors: Secondary | ICD-10-CM | POA: Insufficient documentation

## 2020-07-02 DIAGNOSIS — Z79899 Other long term (current) drug therapy: Secondary | ICD-10-CM | POA: Diagnosis not present

## 2020-07-02 DIAGNOSIS — C50512 Malignant neoplasm of lower-outer quadrant of left female breast: Secondary | ICD-10-CM | POA: Insufficient documentation

## 2020-07-02 DIAGNOSIS — M85851 Other specified disorders of bone density and structure, right thigh: Secondary | ICD-10-CM | POA: Insufficient documentation

## 2020-07-02 DIAGNOSIS — Z853 Personal history of malignant neoplasm of breast: Secondary | ICD-10-CM

## 2020-07-02 DIAGNOSIS — Z7982 Long term (current) use of aspirin: Secondary | ICD-10-CM | POA: Diagnosis not present

## 2020-07-02 DIAGNOSIS — N63 Unspecified lump in unspecified breast: Secondary | ICD-10-CM

## 2020-07-02 DIAGNOSIS — C50111 Malignant neoplasm of central portion of right female breast: Secondary | ICD-10-CM | POA: Diagnosis not present

## 2020-07-02 DIAGNOSIS — I890 Lymphedema, not elsewhere classified: Secondary | ICD-10-CM | POA: Insufficient documentation

## 2020-07-02 DIAGNOSIS — Z923 Personal history of irradiation: Secondary | ICD-10-CM | POA: Diagnosis not present

## 2020-07-02 DIAGNOSIS — Z170 Estrogen receptor positive status [ER+]: Secondary | ICD-10-CM | POA: Diagnosis not present

## 2020-07-02 MED ORDER — ANASTROZOLE 1 MG PO TABS: 1.0000 mg | ORAL_TABLET | Freq: Every day | ORAL | 4 refills | Status: DC

## 2020-07-02 NOTE — Assessment & Plan Note (Signed)
01/31/2019:Palpable lump in the left breast x4 months mammogram 01/27/2019: 2.5 cm mass at 6 o'clock position, 0.7 cm mass retroareolar, axilla negative: Grade 2 IDC ER 100%, PR 100%, HER-2 negative, Ki-67 15%  T2N0 stage IB 03/10/2019: Biopsy of the right breast: DCIS, 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Neoadjuvant antiestrogen therapy anastrozole 1 mg daily 02/17/2019 2.Breast conserving surgery5/18/2021 3. Adjuvant radiation therapy followed by(patient is also not keen on adjuvant radiation) 4.Continuation of adjuvant antiestrogen therapy --------------------------------------------------------------------------------------------------------------------------- Current treatment: Started as neoadjuvant therapy with anastrozole 1 mg daily started 02/17/2019, now on adjuvant anastrozole Anastrozole toxicities:Tolerating it well   09/05/2019:Left lumpectomy ([name]): IDC, grade 2, 3.0cm, with intermediate grade DCIS, 2 left axillary lymph nodes negative.Invasive carcinoma present posterior margin focally, inferior margin focally, anterior superior margin broadly and anterior margin broadly. Right lumpectomy: Benign 10/03/2019: Resection of the margins: Negative for malignancy.  Adjuvant radiation 11/07/2019-12/01/2019  Lymphedema of the arm and the breast: Seeing physical therapy.  Breast cancer surveillance: 1.  Breast exam 07/02/2020: Benign 2. mammogram scheduled for 07/04/2020  Return to clinic in 1 year for follow-up 

## 2020-07-03 ENCOUNTER — Telehealth: Payer: Self-pay | Admitting: Hematology and Oncology

## 2020-07-03 NOTE — Telephone Encounter (Signed)
Scheduled per 3/15 los. Called and spoke with pt, confirmed 3/15 appt

## 2020-07-04 ENCOUNTER — Other Ambulatory Visit: Payer: Medicare Other

## 2020-07-04 ENCOUNTER — Ambulatory Visit
Admission: RE | Admit: 2020-07-04 | Discharge: 2020-07-04 | Disposition: A | Discharge status: Home / Self Care | Payer: Medicare Other | Source: Ambulatory Visit | Attending: Adult Health | Admitting: Adult Health

## 2020-07-04 ENCOUNTER — Other Ambulatory Visit: Payer: Self-pay

## 2020-07-04 DIAGNOSIS — R922 Inconclusive mammogram: Secondary | ICD-10-CM | POA: Diagnosis not present

## 2020-07-04 DIAGNOSIS — C50512 Malignant neoplasm of lower-outer quadrant of left female breast: Secondary | ICD-10-CM

## 2020-07-04 DIAGNOSIS — Z170 Estrogen receptor positive status [ER+]: Secondary | ICD-10-CM

## 2020-07-04 DIAGNOSIS — C50111 Malignant neoplasm of central portion of right female breast: Secondary | ICD-10-CM

## 2020-07-04 IMAGING — MG DIGITAL DIAGNOSTIC BILAT W/ TOMO W/ CAD
6 of 10 series · 6 of 30 positions shown · non-contrast
Comparison: Prior films

CLINICAL DATA: Personal history of bilateral breast cancer status
post lumpectomy [Z7].

EXAM:
DIGITAL DIAGNOSTIC BILATERAL MAMMOGRAM WITH TOMOSYNTHESIS AND CAD
TECHNIQUE: Bilateral digital diagnostic mammography and breast tomosynthesis
was performed. The images were evaluated with computer-aided
detection.

[R CC synth-2D]
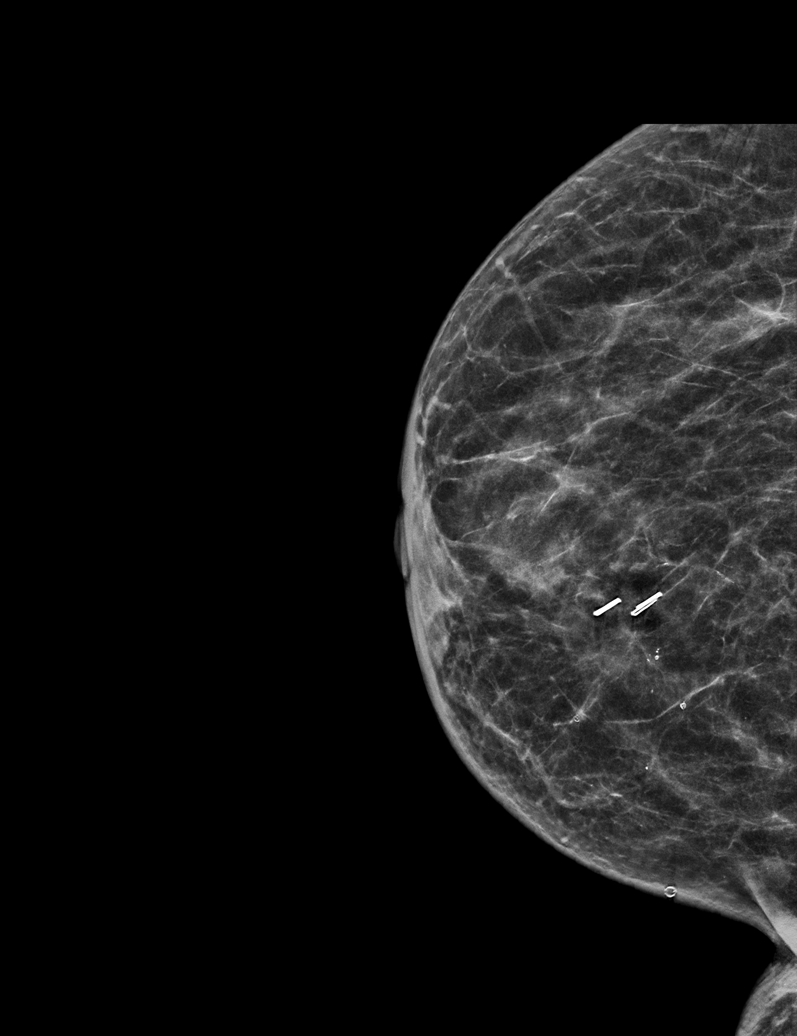

[L MLO synth-2D]
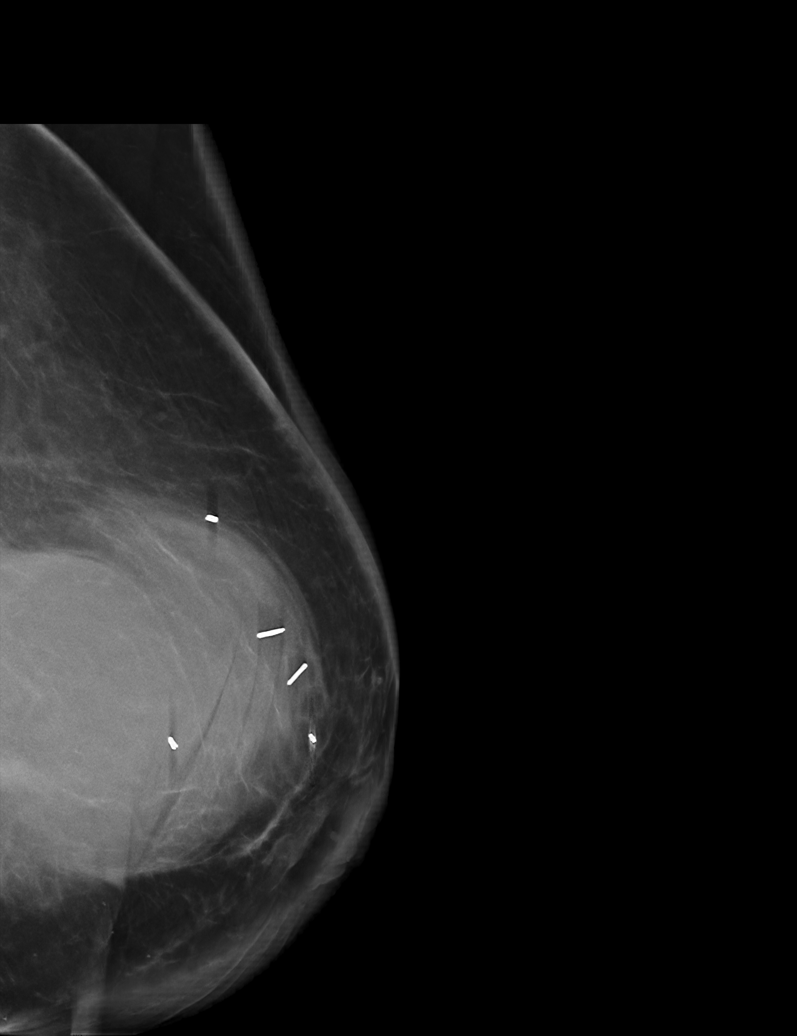

[R TAN synth-2D]
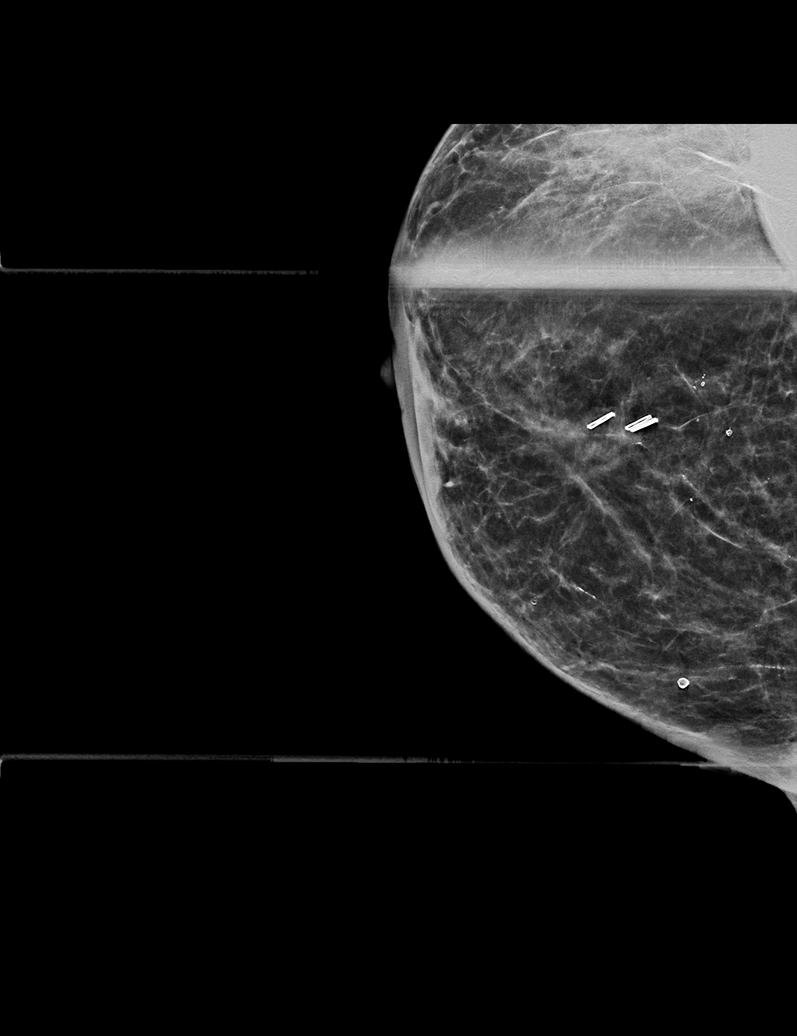

[L CC synth-2D]
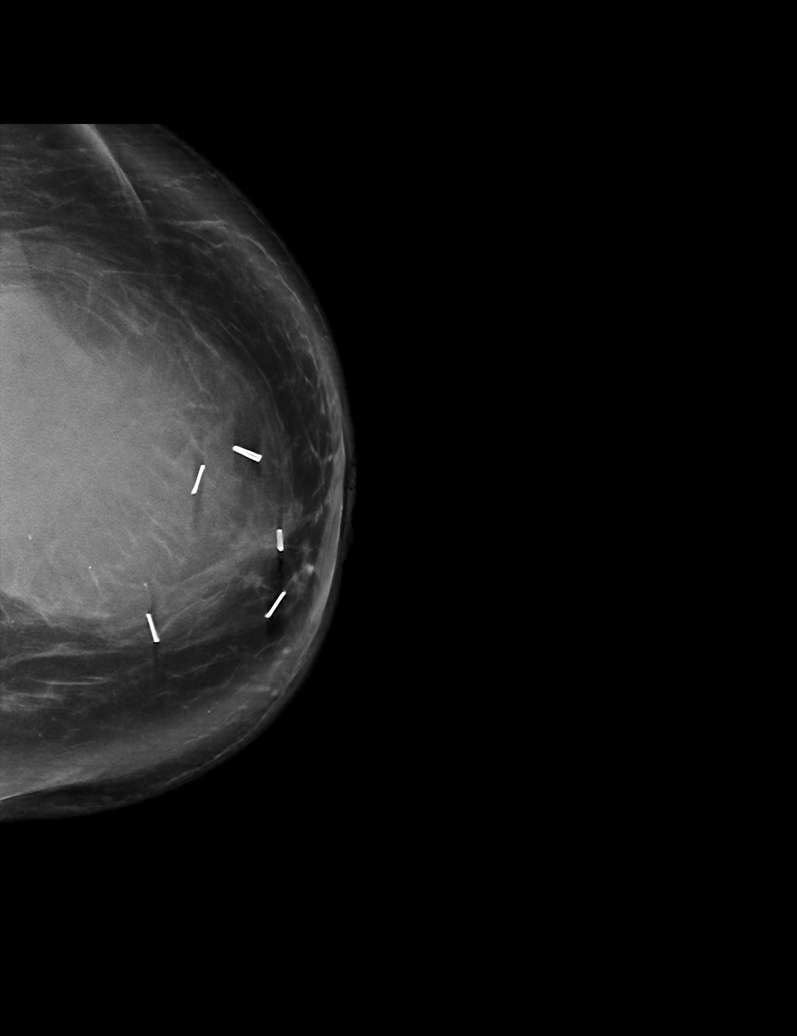

[R MLO synth-2D]
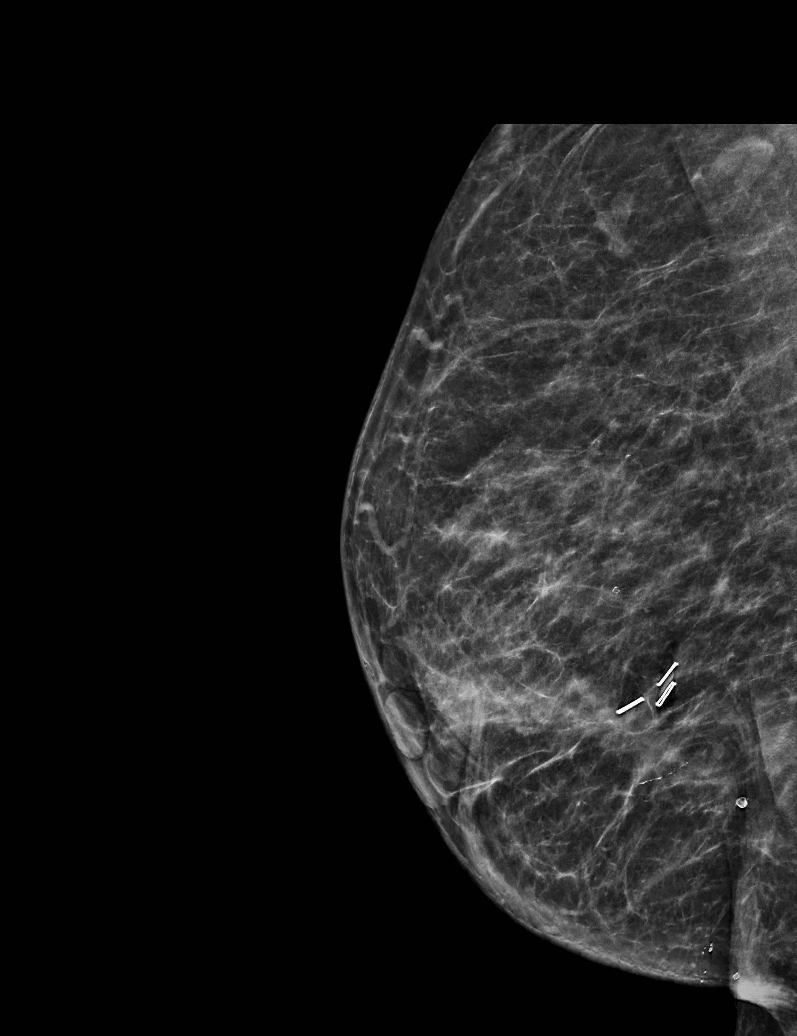

[R CC tomo · tomo slice 27/54.0]
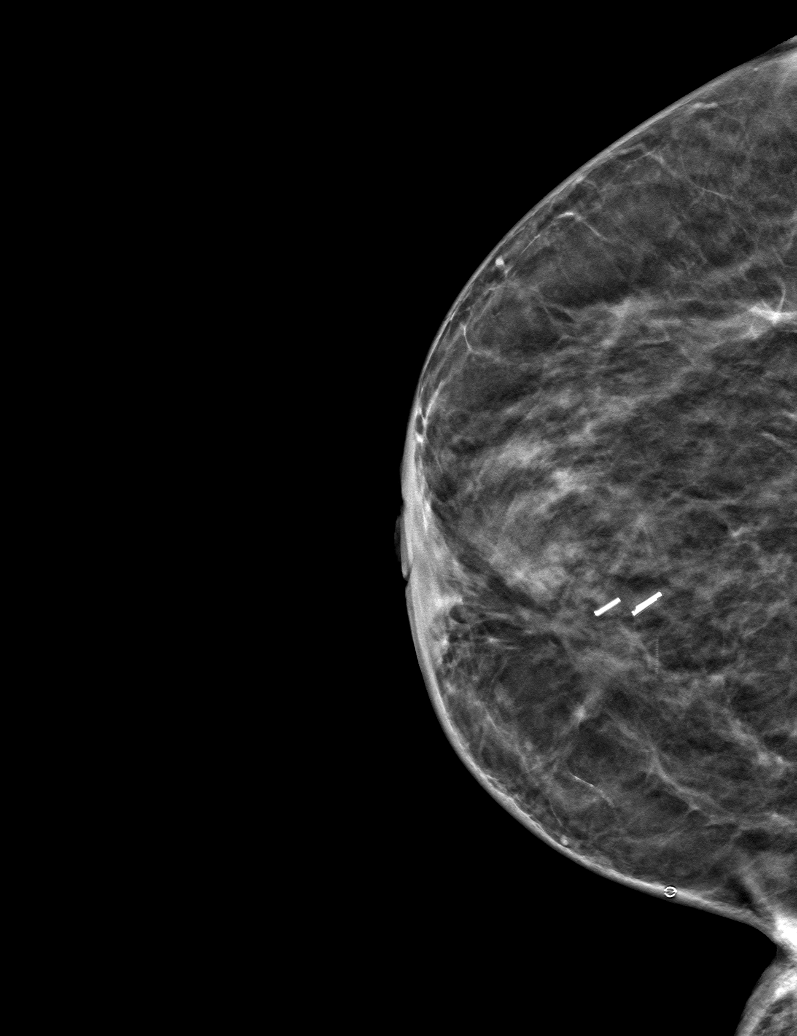

[6 of 30 positions shown; findings below may reference images not displayed]

ACR Breast Density Category b: There are scattered areas of
fibroglandular density.
FINDINGS: Cc and MLO views of bilateral breasts, spot tangential view right
breast are submitted. Postsurgical changes are identified in both
breast. Postsurgical seroma is identified left breast. No suspicious
abnormalities identified bilaterally.
IMPRESSION: Benign findings.

RECOMMENDATION:
Bilateral diagnostic mammogram in 1 year.

I have discussed the findings and recommendations with the patient.
If applicable, a reminder letter will be sent to the patient
regarding the next appointment.

BI-RADS CATEGORY  2: Benign.

## 2020-07-11 ENCOUNTER — Telehealth: Payer: Self-pay

## 2020-07-11 ENCOUNTER — Encounter: Payer: Self-pay | Admitting: Hematology and Oncology

## 2020-07-11 NOTE — Progress Notes (Signed)
Received referral from RN regarding patient needing assistance with bills.  Advised patient of her remaining grant balance she can utilize to help cover expenses and free up money for hospital bills.  Gave her my contact name and number to call when she provides bills to be submitted.

## 2020-07-11 NOTE — Telephone Encounter (Signed)
I had the pleasure of speaking with Robin Anderson today who states she is in need of financial assistance regarding bills from the cancer center. Pt states she has received help in the past with bills and is wondering if there is a program to help pt's on a fixed income with medical bills. Pt states she only receives $700/mo from SS. Message sent to Stefanie Libel to inquire about financial assistance and pt understands we will be in contact with her regarding this matter.

## 2020-09-05 ENCOUNTER — Other Ambulatory Visit: Payer: Self-pay | Admitting: Neurology

## 2020-09-24 NOTE — Progress Notes (Deleted)
Cardiology Office Note:    Date:  09/24/2020   ID:  Robin Anderson, [place] [birth year], MRN [id number]  PCP:  Robin Moro, DO   [facility] Providers Cardiologist:  None {   Referring MD: Robin Moro, DO    History of Present Illness:    Robin Anderson is a 80 y.o. female with a hx of HTN, HLD, breast cancer and seizure disorder who was previously followed by Robin Anderson who now returns to clinic for CV follow-up.  Last saw Robin Anderson on 04/08/20 where she was having a lot of stress due to loss in her family. Was stable from a CV standpoint.    Past Medical History:  Diagnosis Date   Blood in stool    Cancer ([place])    breast - left   Cataracts, bilateral    Chest pain, unspecified    Family history of breast cancer    Headache 07/02/2014   Heart murmur    Hepatitis    History of colon polyps    HLD (hyperlipidemia)    HTN (hypertension)    Prediabetes 07/28/2011   no meds, diet controlled   Seizure disorder (Robin Anderson)    seizures controlled with med   Wears glasses     Past Surgical History:  Procedure Laterality Date   ABDOMINAL HERNIA REPAIR     lower mid line hernia which has reherniatied   BREAST BIOPSY Left 01/31/2019   Malignant   BREAST LUMPECTOMY WITH RADIOACTIVE SEED AND SENTINEL LYMPH NODE BIOPSY N/A 09/05/2019   Procedure: RIGHT BREAST SEED LUMPECTOMY X 1, LEFT BREAST SEED LUMPECTOMY X 2, LEFT AXILLARY SENTINEL LYMPH NODE BIOPSY;  Surgeon: Robin Luna, MD;  Location: [place];  Service: General;  Laterality: N/A;   BUNIONECTOMY Right    traumatic injury to her foot that was repaired   RE-EXCISION OF BREAST LUMPECTOMY Left 10/03/2019   Procedure: LEFT BREAST RE-EXCISION OF BREAST LUMPECTOMY;  Surgeon: Robin Luna, MD;  Location: [place];  Service: General;  Laterality: Left;   TONSILLECTOMY     TUBAL LIGATION      Current Medications: No outpatient medications have been marked as taking for the 09/26/20 encounter (Appointment) with  Robin Bergeron, MD.     Allergies:   Penicillins   Social History   Socioeconomic History   Marital status: Married    Spouse name: Robin Anderson   Number of children: 6   Years of education: 12   Highest education level: Not on file  Occupational History   Occupation: retired  Tobacco Use   Smoking status: Former Smoker    Types: Cigarettes   Smokeless tobacco: Never Used   Tobacco comment: when she was young for a short period of time  Media planner   Vaping Use: Never used  Substance and Sexual Activity   Alcohol use: No    Alcohol/week: 0.0 standard drinks   Drug use: No   Sexual activity: Not on file  Other Topics Concern   Not on file  Social History Narrative   Patient lives at home with her husband Robin Anderson.   Retired.   Education high school.   Right handed..      Regular exercise-yes   Caffeine Use-yes   Social Determinants of Health   Financial Resource Strain: Not on file  Food Insecurity: Not on file  Transportation Needs: Not on file  Physical Activity: Not on file  Stress: Not on file  Social Connections: Not on file  Family History: The patient's ***family history includes Breast cancer in her sister; Heart attack in her daughter and father; Hypertension in her brother, mother, and sister.  ROS:   Please see the history of present illness.   All other systems reviewed and are negative.  EKGs/Labs/Other Studies Reviewed:    The following studies were reviewed today:  EKG:  EKG is *** ordered today.  The ekg ordered today demonstrates ***  Recent Labs: 10/03/2019: ALT 12; Hemoglobin 12.4; Platelets 220 10/17/2019: BUN 11; Creatinine, Ser 0.96; Potassium 3.5; Sodium 140  Recent Lipid Panel    Component Value Date/Time   CHOL 172 04/11/2019 1032   TRIG 59 04/11/2019 1032   HDL 79 04/11/2019 1032   CHOLHDL 2.2 04/11/2019 1032   CHOLHDL 2.2 08/27/2015 1402   VLDL 10 08/27/2015 1402   LDLCALC 81 04/11/2019 1032     Risk  Assessment/Calculations:   {Does this patient have ATRIAL FIBRILLATION?:[phone]}   Physical Exam:    VS:  LMP  (LMP Unknown)     Wt Readings from Last 3 Encounters:  07/02/20 130 lb (59 kg)  04/08/20 134 lb (60.8 kg)  03/01/20 135 lb 1.6 oz (61.3 kg)     GEN: *** Well nourished, well developed in no acute distress HEENT: Normal NECK: No JVD; No carotid bruits LYMPHATICS: No lymphadenopathy CARDIAC: ***RRR, no murmurs, rubs, gallops RESPIRATORY:  Clear to auscultation without rales, wheezing or rhonchi  ABDOMEN: Soft, non-tender, non-distended MUSCULOSKELETAL:  No edema; No deformity  SKIN: Warm and dry NEUROLOGIC:  Alert and oriented x 3 PSYCHIATRIC:  Normal affect   ASSESSMENT:    No diagnosis found. PLAN:    In order of problems listed above:  #HTN: -Continue benzapril-HCTZ 20-25mg  daily  #HLD: -Continue simva 20mg  daily  #Breast Cancer: -Continue anastrozole -Follow-up with Onc as scheduled  #Seizure Disorder: -Follow-up with Neurology as scheduled  {Are you ordering a CV Procedure (e.g. stress test, cath, DCCV, TEE, etc)?   Press F2        :[id number]}    Medication Adjustments/Labs and Tests Ordered: Current medicines are reviewed at length with the patient today.  Concerns regarding medicines are outlined above.  No orders of the defined types were placed in this encounter.  No orders of the defined types were placed in this encounter.   There are no Patient Instructions on file for this visit.   Signed, Robin Bergeron, MD  09/24/2020 8:00 PM    [place]

## 2020-09-26 ENCOUNTER — Other Ambulatory Visit: Payer: Self-pay

## 2020-09-26 ENCOUNTER — Ambulatory Visit (INDEPENDENT_AMBULATORY_CARE_PROVIDER_SITE_OTHER): Payer: Medicare Other | Admitting: Cardiology

## 2020-09-26 ENCOUNTER — Encounter: Payer: Self-pay | Admitting: Cardiology

## 2020-09-26 VITALS — BP 130/74 | HR 80 | Ht 64.0 in | Wt 134.2 lb

## 2020-09-26 DIAGNOSIS — Z170 Estrogen receptor positive status [ER+]: Secondary | ICD-10-CM | POA: Diagnosis not present

## 2020-09-26 DIAGNOSIS — G40909 Epilepsy, unspecified, not intractable, without status epilepticus: Secondary | ICD-10-CM | POA: Diagnosis not present

## 2020-09-26 DIAGNOSIS — I10 Essential (primary) hypertension: Secondary | ICD-10-CM

## 2020-09-26 DIAGNOSIS — E785 Hyperlipidemia, unspecified: Secondary | ICD-10-CM

## 2020-09-26 DIAGNOSIS — Z9221 Personal history of antineoplastic chemotherapy: Secondary | ICD-10-CM

## 2020-09-26 DIAGNOSIS — R011 Cardiac murmur, unspecified: Secondary | ICD-10-CM | POA: Diagnosis not present

## 2020-09-26 DIAGNOSIS — C50512 Malignant neoplasm of lower-outer quadrant of left female breast: Secondary | ICD-10-CM | POA: Diagnosis not present

## 2020-09-26 NOTE — Progress Notes (Signed)
Cardiology Office Note:    Date:  09/26/2020   ID:  Robin Anderson, [place] Feb 16, 1941, MRN [id number]  PCP:  Robin Moro, DO   [facility] Providers Cardiologist:  None {   Referring MD: Robin Moro, DO    History of Present Illness:    Robin Anderson is a 80 y.o. female with a hx of HTN, HLD, breast cancer and seizure disorder who was previously followed by Robin Anderson who now returns to clinic for CV follow-up.  Last saw Robin Anderson on 04/08/20 where she was having a lot of stress due to loss in her family. Was stable from a CV standpoint.  Today, she is doing well from a CV standpoint. Blood pressure is well controlled but her lotensin is very expensive and she is requesting an alternative in the future. Otherwise, she is concerned about scar tissue in her left breast following her lumpectomy. She received a mammogram to confirm there was no new lesions. She is currently in remission. Notably she had 4 wks of radiation in August of 2021. She has been on the arimidex 1 mg since May 2021.  BP Readings from Last 3 Encounters:  09/26/20 130/74  07/02/20 (!) 149/69  04/08/20 140/70   Her PCP monitors her cholesterol and it has been well controlled.  She denies exertional shortness of breath, chest pain, palpitations, tightness or pressure. She denies having PND, orthopnea, dizziness nor syncopal episodes. She follows-up with neurology to monitor her seizures.   Past Medical History:  Diagnosis Date   Blood in stool    Cancer ([place])    breast - left   Cataracts, bilateral    Chest pain, unspecified    Family history of breast cancer    Headache 07/02/2014   Heart murmur    Hepatitis    History of colon polyps    HLD (hyperlipidemia)    HTN (hypertension)    Prediabetes 07/28/2011   no meds, diet controlled   Seizure disorder ([place])    seizures controlled with med   Wears glasses     Past Surgical History:  Procedure Laterality Date   ABDOMINAL HERNIA  REPAIR     lower mid line hernia which has reherniatied   BREAST BIOPSY Left 01/31/2019   Malignant   BREAST LUMPECTOMY WITH RADIOACTIVE SEED AND SENTINEL LYMPH NODE BIOPSY N/A 09/05/2019   Procedure: RIGHT BREAST SEED LUMPECTOMY X 1, LEFT BREAST SEED LUMPECTOMY X 2, LEFT AXILLARY SENTINEL LYMPH NODE BIOPSY;  Surgeon: Robin Luna, MD;  Location: [facility];  Service: General;  Laterality: N/A;   BUNIONECTOMY Right    traumatic injury to her foot that was repaired   RE-EXCISION OF BREAST LUMPECTOMY Left 10/03/2019   Procedure: LEFT BREAST RE-EXCISION OF BREAST LUMPECTOMY;  Surgeon: Robin Luna, MD;  Location: [facility];  Service: General;  Laterality: Left;   TONSILLECTOMY     TUBAL LIGATION      Current Medications: Current Meds  Medication Sig   anastrozole (ARIMIDEX) 1 MG tablet TAKE 1 TABLET BY MOUTH DAILY   aspirin EC 81 MG tablet Take 81 mg by mouth daily.   benazepril-hydrochlorthiazide (LOTENSIN HCT) 20-25 MG tablet Take 1 tablet by mouth daily.   Cholecalciferol (VITAMIN D3) 50 MCG (2000 UT) TABS Take 2,000 Units by mouth daily.   primidone (MYSOLINE) 50 MG tablet TAKE 3 TABLETS BY MOUTH AT BEDTIME   simvastatin (ZOCOR) 20 MG tablet TAKE 1 TABLET BY MOUTH EVERYDAY AT BEDTIME     Allergies:  Penicillins   Social History   Socioeconomic History   Marital status: Married    Spouse name: Robin Anderson   Number of children: 6   Years of education: 12   Highest education level: Not on file  Occupational History   Occupation: retired  Tobacco Use   Smoking status: Former    Pack years: 0.00    Types: Cigarettes   Smokeless tobacco: Never   Tobacco comments:    when she was young for a short period of time  Media planner   Vaping Use: Never used  Substance and Sexual Activity   Alcohol use: No    Alcohol/week: 0.0 standard drinks   Drug use: No   Sexual activity: Not on file  Other Topics Concern   Not on file  Social History Narrative   Patient lives at home with her  husband Robin Anderson.   Retired.   Education high school.   Right handed..      Regular exercise-yes   Caffeine Use-yes   Social Determinants of Health   Financial Resource Strain: Not on file  Food Insecurity: Not on file  Transportation Needs: Not on file  Physical Activity: Not on file  Stress: Not on file  Social Connections: Not on file     Family History: The patient's family history includes Breast cancer in her sister; Heart attack in her daughter and father; Hypertension in her brother, mother, and sister.  ROS:   Review of Systems  Constitutional:  Negative for chills, diaphoresis, fever and malaise/fatigue.  HENT:  Negative for sore throat.   Respiratory:  Negative for cough and shortness of breath.   Cardiovascular:  Negative for chest pain, palpitations, orthopnea, claudication, leg swelling and PND.  Gastrointestinal:  Negative for abdominal pain, blood in stool, constipation, diarrhea and nausea.  Genitourinary:  Negative for dysuria.  Musculoskeletal:  Negative for joint pain.  Skin:  Negative for rash.  Neurological:  Negative for dizziness, loss of consciousness and headaches.    EKGs/Labs/Other Studies Reviewed:    The following studies were reviewed today: No new cardiac studies  EKG:  EKG not performed today  Recent Labs: 10/03/2019: ALT 12; Hemoglobin 12.4; Platelets 220 10/17/2019: BUN 11; Creatinine, Ser 0.96; Potassium 3.5; Sodium 140  Recent Lipid Panel    Component Value Date/Time   CHOL 172 04/11/2019 1032   TRIG 59 04/11/2019 1032   HDL 79 04/11/2019 1032   CHOLHDL 2.2 04/11/2019 1032   CHOLHDL 2.2 08/27/2015 1402   VLDL 10 08/27/2015 1402   LDLCALC 81 04/11/2019 1032      Physical Exam:    VS:  BP 130/74   Pulse 80   Ht 5\' 4"  (1.626 m)   Wt 134 lb 3.2 oz (60.9 kg)   LMP  (LMP Unknown)   SpO2 99%   BMI 23.04 kg/m     Wt Readings from Last 3 Encounters:  09/26/20 134 lb 3.2 oz (60.9 kg)  07/02/20 130 lb (59 kg)   04/08/20 134 lb (60.8 kg)     GEN: Well nourished, well developed in no acute distress HEENT: Normal NECK: No JVD; No carotid bruits CARDIAC: RRR, 2/6 sytolic murmurs, rubs, gallops RESPIRATORY:  Clear to auscultation without rales, wheezing or rhonchi  ABDOMEN: Soft, non-tender, non-distended MUSCULOSKELETAL:  No edema; No deformity  SKIN: Warm and dry NEUROLOGIC:  Alert and oriented x 3 PSYCHIATRIC:  Normal affect   ASSESSMENT:    1. Essential hypertension   2. Malignant neoplasm of lower-outer  quadrant of left breast of female, estrogen receptor positive ([place])   3. Hx antineoplastic chemo   4. Systolic murmur   5. Hyperlipidemia, unspecified hyperlipidemia type   6. Seizure disorder ([place])    PLAN:    In order of problems listed above:  #HTN: Well controlled at home. Current medication is getting too expensive and will switch it in the future once completes her current prescription.  -Continue benzapril-HCTZ 20-25mg  daily for now--->will change to lisinopril-HCTZ 20-25mg  daily in the future for cost -Repeat BMET at time of medication switch  #HLD: Monitored by PCP. LDL 81 with goal <100.  -Continue simva 20mg  daily  #Breast Cancer: Currently in remission. S/p left lumpectomy and 4 weeks of XRT therapy. -Continue anastrozole 1mg  daily -Will check TEE with strain -Follow-up with Onc as scheduled  #Systolic Murmur: -Check TTE as above  #Seizure Disorder: -Follow-up with Neurology as scheduled     Medication Adjustments/Labs and Tests Ordered: Current medicines are reviewed at length with the patient today.  Concerns regarding medicines are outlined above.  Orders Placed This Encounter  Procedures   ECHOCARDIOGRAM COMPLETE   No orders of the defined types were placed in this encounter.   Patient Instructions  Medication Instructions:   PLEASE FINISH UP ALL YOUR LOTENSIN AS PRESCRIBED THEN WHEN IT IS TIME TO REFILL, CALL OUR OFFICE AT [phone] AND ASK  TO SPEAK WITH DR. Jacolyn Reedy NURSE SO THAT WE CAN SWITCH YOUR LOTENSIN TO ALTERNATIVE MEDICATION LISINOPRIL-HCTZ  *If you need a refill on your cardiac medications before your next appointment, please call your pharmacy*   Testing/Procedures:  Your physician has requested that you have an echocardiogram. Echocardiography is a painless test that uses sound waves to create images of your heart. It provides your doctor with information about the size and shape of your heart and how well your heart's chambers and valves are working. This procedure takes approximately one hour. There are no restrictions for this procedure. DO ECHO WITH STRAIN PER DR. Johney Frame   Follow-Up:  6-8 MONTHS IN THE OFFICE WITH DR. Johney Frame    I,Essence Turner,acting as a scribe for Freada Bergeron, MD.,have documented all relevant documentation on the behalf of Freada Bergeron, MD,as directed by  Freada Bergeron, MD while in the presence of Freada Bergeron, MD.  I, Freada Bergeron, MD, have reviewed all documentation for this visit. The documentation on 09/26/20 for the exam, diagnosis, procedures, and orders are all accurate and complete.    Signed, Freada Bergeron, MD  09/26/2020 11:32 AM    [facility]

## 2020-09-26 NOTE — Patient Instructions (Signed)
Medication Instructions:   PLEASE FINISH UP ALL YOUR LOTENSIN AS PRESCRIBED THEN WHEN IT IS TIME TO REFILL, CALL OUR OFFICE AT [phone] AND ASK TO SPEAK WITH DR. Jacolyn Reedy NURSE SO THAT WE CAN SWITCH YOUR LOTENSIN TO ALTERNATIVE MEDICATION LISINOPRIL-HCTZ  *If you need a refill on your cardiac medications before your next appointment, please call your pharmacy*   Testing/Procedures:  Your physician has requested that you have an echocardiogram. Echocardiography is a painless test that uses sound waves to create images of your heart. It provides your doctor with information about the size and shape of your heart and how well your heart's chambers and valves are working. This procedure takes approximately one hour. There are no restrictions for this procedure. DO ECHO WITH STRAIN PER DR. Johney Frame   Follow-Up:  6-8 MONTHS IN THE OFFICE WITH DR. Johney Frame

## 2020-10-02 DIAGNOSIS — R7989 Other specified abnormal findings of blood chemistry: Secondary | ICD-10-CM | POA: Diagnosis not present

## 2020-10-02 DIAGNOSIS — Z170 Estrogen receptor positive status [ER+]: Secondary | ICD-10-CM | POA: Diagnosis not present

## 2020-10-02 DIAGNOSIS — I130 Hypertensive heart and chronic kidney disease with heart failure and stage 1 through stage 4 chronic kidney disease, or unspecified chronic kidney disease: Secondary | ICD-10-CM | POA: Diagnosis not present

## 2020-10-02 DIAGNOSIS — M8580 Other specified disorders of bone density and structure, unspecified site: Secondary | ICD-10-CM | POA: Diagnosis not present

## 2020-10-02 DIAGNOSIS — C50111 Malignant neoplasm of central portion of right female breast: Secondary | ICD-10-CM | POA: Diagnosis not present

## 2020-10-02 DIAGNOSIS — N1831 Chronic kidney disease, stage 3a: Secondary | ICD-10-CM | POA: Diagnosis not present

## 2020-10-02 DIAGNOSIS — C50512 Malignant neoplasm of lower-outer quadrant of left female breast: Secondary | ICD-10-CM | POA: Diagnosis not present

## 2020-10-02 DIAGNOSIS — Z79899 Other long term (current) drug therapy: Secondary | ICD-10-CM | POA: Diagnosis not present

## 2020-10-02 DIAGNOSIS — Z7189 Other specified counseling: Secondary | ICD-10-CM | POA: Diagnosis not present

## 2020-10-03 ENCOUNTER — Telehealth: Payer: Self-pay | Admitting: Cardiology

## 2020-10-03 NOTE — Telephone Encounter (Signed)
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 Patient did say that we can call the office to get the details. Patients wants to know because of all of this should she still get her echogram done. Please advise  Dr. Arthur Holms: [phone]

## 2020-10-03 NOTE — Telephone Encounter (Signed)
Pt just wanted to report to Dr. Johney Frame and myself that she went to her PCP yesterday, and had an excellent wellness visit.  Pt is also asking if she should keep her scheduled echo with [place] on 7/8. Informed the pt that she should keep her echo as scheduled, for she ordered this to assess her LVEF from s/p lumpectomy and 4 weeks of XRT therapy.  Echo is ordered with strain.  Advised the pt to keep this appt as scheduled for 7/8. Pt verbalized understanding and agrees with this plan. Pt was more than gracious for all the assistance provided.

## 2020-10-25 ENCOUNTER — Ambulatory Visit (HOSPITAL_COMMUNITY): Payer: Medicare Other | Attending: Cardiovascular Disease

## 2020-10-25 ENCOUNTER — Other Ambulatory Visit: Payer: Self-pay

## 2020-10-25 DIAGNOSIS — Z9221 Personal history of antineoplastic chemotherapy: Secondary | ICD-10-CM

## 2020-10-25 DIAGNOSIS — C50512 Malignant neoplasm of lower-outer quadrant of left female breast: Secondary | ICD-10-CM | POA: Diagnosis not present

## 2020-10-25 DIAGNOSIS — R011 Cardiac murmur, unspecified: Secondary | ICD-10-CM

## 2020-10-25 DIAGNOSIS — Z170 Estrogen receptor positive status [ER+]: Secondary | ICD-10-CM | POA: Diagnosis not present

## 2020-10-25 LAB — ECHOCARDIOGRAM COMPLETE
Area-P 1/2: 2.25 cm2
S' Lateral: 1.8 cm

## 2020-10-26 ENCOUNTER — Encounter (HOSPITAL_COMMUNITY): Payer: Self-pay

## 2021-01-16 DIAGNOSIS — H04123 Dry eye syndrome of bilateral lacrimal glands: Secondary | ICD-10-CM | POA: Diagnosis not present

## 2021-01-16 DIAGNOSIS — H2513 Age-related nuclear cataract, bilateral: Secondary | ICD-10-CM | POA: Diagnosis not present

## 2021-01-16 DIAGNOSIS — H40053 Ocular hypertension, bilateral: Secondary | ICD-10-CM | POA: Diagnosis not present

## 2021-01-16 DIAGNOSIS — H4323 Crystalline deposits in vitreous body, bilateral: Secondary | ICD-10-CM | POA: Diagnosis not present

## 2021-01-20 ENCOUNTER — Encounter: Payer: Self-pay | Admitting: Adult Health

## 2021-01-20 ENCOUNTER — Ambulatory Visit (INDEPENDENT_AMBULATORY_CARE_PROVIDER_SITE_OTHER): Payer: Medicare Other | Admitting: Adult Health

## 2021-01-20 VITALS — BP 129/78 | HR 71 | Ht 64.0 in | Wt 136.4 lb

## 2021-01-20 DIAGNOSIS — G40409 Other generalized epilepsy and epileptic syndromes, not intractable, without status epilepticus: Secondary | ICD-10-CM | POA: Diagnosis not present

## 2021-01-20 DIAGNOSIS — R251 Tremor, unspecified: Secondary | ICD-10-CM

## 2021-01-20 NOTE — Progress Notes (Signed)
PATIENT: Robin Anderson DOB: Sep 05, 1940  REASON FOR VISIT: follow up HISTORY FROM: patient PRIMARY NEUROLOGIST:   HISTORY OF PRESENT ILLNESS: Today 01/20/21 Robin Anderson is a 80 y.o. female with a history of tremors. She is here today for a follow up. Today patient expresses increased tremor in right hand compared to her left. She denies difficulty getting dressed or having gait imbalances and falls. However she does endorse some minor difficulties with feeding and handwriting. She denies excess sleepiness or fatigue. She reports that she has been taking the Primidone 50 MG in the morning, at noon, and in the evening. Since taking the medication this way she expresses that her tremors have not improved and the right hand tremor has been exacerbated. She denies any seizure activity at this time. She has no other complaints at this time.  HISTORY  01/17/20: Robin Anderson is a 80 year old female with a history of seizure disorder and essential tremor. She returns today for follow-up. Denies any seizures. Continues on primidone 50 mg 3 tablets at bedtime. Reports that she notices the tremor with handwriting. Feels that it might be worse. She had breast cancer had surgery in May and again in June. She is having swelling in the left breast. Will follow-up with surgeon on Monday. She returns today for follow-up.   07/12/19  Robin Anderson is a 80 year old female with history of seizure disorder and tremor in her right hand. She has previously been on Keppra, but stopped due to report of moles on her face and scalp. She remains on primidone for seizure prevention and tremor. Her last seizure occurred in 2018 while taking Keppra 250 mg in the morning, 500 mg in the evening. The seizure episode was described as having woken up in the morning, had bitten her lip. She is taking primidone 150 mg at bedtime. She has breast cancer, is on oral chemo.  She has not had recurrent seizure.  The tremor is improved with  primidone in the right hand, it may increase during times of anxiety. When she takes the primidone at night, she may have a chalky taste in her mouth.  She does not drive a car.  She presents today for follow-up unaccompanied.    REVIEW OF SYSTEMS: Out of a complete 14 system review of symptoms, the patient complains only of the following symptoms, and all other reviewed systems are negative. Tremor in right hand.   ALLERGIES: Allergies  Allergen Reactions   Penicillins Rash    HOME MEDICATIONS: Outpatient Medications Prior to Visit  Medication Sig Dispense Refill   anastrozole (ARIMIDEX) 1 MG tablet TAKE 1 TABLET BY MOUTH DAILY 90 tablet 4   aspirin EC 81 MG tablet Take 81 mg by mouth daily.     benazepril-hydrochlorthiazide (LOTENSIN HCT) 20-25 MG tablet Take 1 tablet by mouth daily. 90 tablet 3   Cholecalciferol (VITAMIN D3) 50 MCG (2000 UT) TABS Take 2,000 Units by mouth daily.     primidone (MYSOLINE) 50 MG tablet TAKE 3 TABLETS BY MOUTH AT BEDTIME 270 tablet 1   simvastatin (ZOCOR) 20 MG tablet TAKE 1 TABLET BY MOUTH EVERYDAY AT BEDTIME 90 tablet 2   No facility-administered medications prior to visit.    PAST MEDICAL HISTORY: Past Medical History:  Diagnosis Date   Blood in stool    Cancer ([place])    breast - left   Cataracts, bilateral    Chest pain, unspecified    Family history of breast cancer  Headache 07/02/2014   Heart murmur    Hepatitis    History of colon polyps    HLD (hyperlipidemia)    HTN (hypertension)    Prediabetes 07/28/2011   no meds, diet controlled   Seizure disorder ([place])    seizures controlled with med   Wears glasses     PAST SURGICAL HISTORY: Past Surgical History:  Procedure Laterality Date   ABDOMINAL HERNIA REPAIR     lower mid line hernia which has reherniatied   BREAST BIOPSY Left 01/31/2019   Malignant   BREAST LUMPECTOMY WITH RADIOACTIVE SEED AND SENTINEL LYMPH NODE BIOPSY N/A 09/05/2019   Procedure: RIGHT BREAST SEED  LUMPECTOMY X 1, LEFT BREAST SEED LUMPECTOMY X 2, LEFT AXILLARY SENTINEL LYMPH NODE BIOPSY;  Surgeon: Erroll Luna, MD;  Location: [facility];  Service: General;  Laterality: N/A;   BUNIONECTOMY Right    traumatic injury to her foot that was repaired   RE-EXCISION OF BREAST LUMPECTOMY Left 10/03/2019   Procedure: LEFT BREAST RE-EXCISION OF BREAST LUMPECTOMY;  Surgeon: Erroll Luna, MD;  [facility];  Service: General;  Laterality: Left;   TONSILLECTOMY     TUBAL LIGATION      FAMILY HISTORY: Family History  Problem Relation Age of Onset   Hypertension Mother    Heart attack Father    Hypertension Sister        5 sisters....3 have HTN   Breast cancer Sister    Hypertension Brother    Heart attack Daughter    Tremor Neg Hx     SOCIAL HISTORY: Social History   Socioeconomic History   Marital status: Married    Spouse name: Robin Anderson   Number of children: 6   Years of education: 12   Highest education level: Not on file  Occupational History   Occupation: retired  Tobacco Use   Smoking status: Former    Types: Cigarettes   Smokeless tobacco: Never   Tobacco comments:    when she was young for a short period of time  Media planner   Vaping Use: Never used  Substance and Sexual Activity   Alcohol use: No    Alcohol/week: 0.0 standard drinks   Drug use: No   Sexual activity: Not on file  Other Topics Concern   Not on file  Social History Narrative   Patient lives at home with her husband Robin Anderson.   Retired.   Education high school.   Right handed..      Regular exercise-yes   Caffeine Use-yes   Social Determinants of Health   Financial Resource Strain: Not on file  Food Insecurity: Not on file  Transportation Needs: Not on file  Physical Activity: Not on file  Stress: Not on file  Social Connections: Not on file  Intimate Partner Violence: Not on file      PHYSICAL EXAM  Vitals:   01/20/21 1114  BP: 129/78  Pulse: 71  Weight: 61.9 kg  Height:  5\' 4"  (1.626 m)   Body mass index is 23.41 kg/m.  Generalized: Well developed, in no acute distress   Neurological examination  Mentation: Alert oriented to time, place, history taking. Follows all commands speech and language fluent Cranial nerve II-XII: Pupils were equal round reactive to light. Extraocular movements were full, visual field were full on confrontational test. Facial sensation and strength were normal. Uvula tongue midline. Head turning and shoulder shrug  were normal and symmetric. Motor: The motor testing reveals 5 over 5 strength of all  4 extremities. Good symmetric motor tone is noted throughout.  Sensory: Sensory testing is intact to soft touch on all 4 extremities. No evidence of extinction is noted.  Coordination: Cerebellar testing reveals action tremor in the right on finger-nose-finger. Normal heel to shin. Spiral test was positive for essential tremor R > L.  Gait and station: Gait is normal. Tandem gait is normal.    DIAGNOSTIC DATA (LABS, IMAGING, TESTING) - I reviewed patient records, labs, notes, testing and imaging myself where available.  Lab Results  Component Value Date   WBC 6.5 10/03/2019   HGB 12.4 10/03/2019   HCT 38.9 10/03/2019   MCV 88.4 10/03/2019   PLT 220 10/03/2019      Component Value Date/Time   NA 140 10/17/2019 1221   K 3.5 10/17/2019 1221   CL 99 10/17/2019 1221   CO2 29 10/17/2019 1221   GLUCOSE 74 10/17/2019 1221   GLUCOSE 110 (H) 10/03/2019 0612   BUN 11 10/17/2019 1221   CREATININE 0.96 10/17/2019 1221   CREATININE 0.95 (H) 09/11/2015 0944   CALCIUM 9.6 10/17/2019 1221   PROT 7.6 10/03/2019 0612   PROT 7.1 04/11/2019 1032   ALBUMIN 4.1 10/03/2019 0612   ALBUMIN 4.4 04/11/2019 1032   AST 17 10/03/2019 0612   ALT 12 10/03/2019 0612   ALKPHOS 87 10/03/2019 0612   BILITOT 0.7 10/03/2019 0612   BILITOT 0.3 04/11/2019 1032   GFRNONAA 58 (L) 10/17/2019 1221   GFRAA 66 10/17/2019 1221   Lab Results  Component Value  Date   CHOL 172 04/11/2019   HDL 79 04/11/2019   LDLCALC 81 04/11/2019   TRIG 59 04/11/2019   CHOLHDL 2.2 04/11/2019   Lab Results  Component Value Date   HGBA1C 5.8 08/10/2012   No results found for: [id number] Lab Results  Component Value Date   TSH 1.880 09/01/2017      ASSESSMENT AND PLAN 80 y.o. year old female  has a past medical history of Blood in stool, Cancer ([place]), Cataracts, bilateral, Chest pain, unspecified, Family history of breast cancer, Headache (07/02/2014), Heart murmur, Hepatitis, History of colon polyps, HLD (hyperlipidemia), HTN (hypertension), Prediabetes (07/28/2011), Seizure disorder ([place]), and Wears glasses. here with   1: Essential Tremor -Begin taking Primidone 50 MG in the morning and 100 MG in the evening. -Let us know if your tremors do not improve or if they worsen.   2: Seizures - No seizures at this time, continue to monitor for seizure activity.  FU in 1 year or sooner if needed  Ward Givens, MSN, NP-C 01/20/2021, 11:16 AM [facility] [address], [place], [place] [address] [phone]

## 2021-01-20 NOTE — Patient Instructions (Signed)
Begin taking Primidone 50 MG in the morning and 100 MG in the evening. Let us know if your tremors do not improve or if they worsen. Follow up in 1 year or sooner if needed.

## 2021-01-21 ENCOUNTER — Telehealth: Payer: Self-pay | Admitting: Cardiology

## 2021-01-21 DIAGNOSIS — Z79899 Other long term (current) drug therapy: Secondary | ICD-10-CM

## 2021-01-21 DIAGNOSIS — I10 Essential (primary) hypertension: Secondary | ICD-10-CM

## 2021-01-21 MED ORDER — LISINOPRIL-HYDROCHLOROTHIAZIDE 20-25 MG PO TABS: 1.0000 | ORAL_TABLET | Freq: Every day | ORAL | 1 refills | Status: DC

## 2021-01-21 NOTE — Telephone Encounter (Signed)
  PLAN:     In order of problems listed above:   #HTN: Well controlled at home. Current medication is getting too expensive and will switch it in the future once completes her current prescription.  -Continue benzapril-HCTZ 20-25mg  daily for now--->will change to lisinopril-HCTZ 20-25mg  daily in the future for cost -Repeat BMET at time of medication switch     As indicated above at pts last OV appt with Dr. Johney Frame on 6/9, she was to finish out her supply of benazepril-hctz, and when this supply exhaust, she was to let us know so we could switch her over to lisinopril-HCTZ 20-25 mg po daily, with a repeat BMET at the time of medication switch.    Called the pt and endorsed to her that I will make the switch to her meds and call in lisinopril-HCTZ 20-25 mg po daily to her pharmacy, and I will need her to come in for repeat BMET when she starts the new med.  Pt states she has 13 days left of her old prescription of benazepril-hctz, and will start new switch on 10/18.  Informed the pt that given she will start new switch lisinopril-hctz on 10/18, she will need to come in for lab work same day, on 10/18.   Scheduled the pt to come in for repeat BMET on 10/18.  Confirmed the pharmacy of choice with the pt.  Pt verbalized understanding and agrees with this plan.

## 2021-01-21 NOTE — Telephone Encounter (Signed)
Pt c/o medication issue:  1. Name of Medication: LISINOPRIL-HCTZ  2. How are you currently taking this medication (dosage and times per day)? Not currently taking   3. Are you having a reaction (difficulty breathing--STAT)? No   4. What is your medication issue? Devon is calling due to being advised on her AVS of her last appt with the office "Marianna IT IS TIME TO REFILL, CALL OUR OFFICE AT [phone] AND ASK TO SPEAK WITH DR. Jacolyn Reedy NURSE SO THAT WE CAN SWITCH YOUR LOTENSIN TO ALTERNATIVE MEDICATION LISINOPRIL-HCTZ" She only has three tablets left of the Lotensin. Please advise.

## 2021-02-04 ENCOUNTER — Telehealth: Payer: Self-pay | Admitting: *Deleted

## 2021-02-04 ENCOUNTER — Other Ambulatory Visit: Payer: Self-pay

## 2021-02-04 ENCOUNTER — Other Ambulatory Visit: Payer: Medicare Other | Admitting: *Deleted

## 2021-02-04 DIAGNOSIS — Z79899 Other long term (current) drug therapy: Secondary | ICD-10-CM | POA: Diagnosis not present

## 2021-02-04 DIAGNOSIS — I10 Essential (primary) hypertension: Secondary | ICD-10-CM

## 2021-02-04 DIAGNOSIS — E876 Hypokalemia: Secondary | ICD-10-CM

## 2021-02-04 LAB — BASIC METABOLIC PANEL
BUN/Creatinine Ratio: 13 (ref 12–28)
BUN: 14 mg/dL (ref 8–27)
CO2: 26 mmol/L (ref 20–29)
Calcium: 9.3 mg/dL (ref 8.7–10.3)
Chloride: 96 mmol/L (ref 96–106)
Creatinine, Ser: 1.06 mg/dL — ABNORMAL HIGH (ref 0.57–1.00)
Glucose: 169 mg/dL — ABNORMAL HIGH (ref 70–99)
Potassium: 3.3 mmol/L — ABNORMAL LOW (ref 3.5–5.2)
Sodium: 136 mmol/L (ref 134–144)
eGFR: 54 mL/min/{1.73_m2} — ABNORMAL LOW (ref >59)

## 2021-02-04 MED ORDER — POTASSIUM CHLORIDE CRYS ER 20 MEQ PO TBCR: 20.0000 meq | EXTENDED_RELEASE_TABLET | Freq: Every day | ORAL | 0 refills | Status: DC

## 2021-02-04 NOTE — Telephone Encounter (Signed)
-----   Message from Berniece Salines, DO sent at 02/04/2021  5:50 PM EDT ----- This is Dr. Jacolyn Reedy patient: Please send her potassium supplement 20 mg for 3 days.  Have her come back in 2 weeks for blood work.

## 2021-02-04 NOTE — Telephone Encounter (Signed)
Pt aware of lab results and recommendations per Godfrey Pick Tobb DO, covering for Dr. Johney Frame this week.  Pt aware to start taking KDUR 20 mEq po daily for 3 days only and come in for repeat BMET in 2 weeks. Confirmed the pharmacy of choice with the pt. Scheduled the pt for repeat BMET on 02/17/21. Advised the pt to make sure she is getting a healthy amount of K in her diet.  Pt verbalized understanding and agrees with this plan.

## 2021-02-12 ENCOUNTER — Other Ambulatory Visit: Payer: Self-pay | Admitting: *Deleted

## 2021-02-12 MED ORDER — SIMVASTATIN 20 MG PO TABS: ORAL_TABLET | ORAL | 2 refills | Status: DC

## 2021-02-17 ENCOUNTER — Other Ambulatory Visit: Payer: Medicare Other | Admitting: *Deleted

## 2021-02-17 ENCOUNTER — Other Ambulatory Visit: Payer: Self-pay

## 2021-02-17 DIAGNOSIS — E876 Hypokalemia: Secondary | ICD-10-CM

## 2021-02-17 DIAGNOSIS — Z79899 Other long term (current) drug therapy: Secondary | ICD-10-CM | POA: Diagnosis not present

## 2021-02-17 LAB — BASIC METABOLIC PANEL
BUN/Creatinine Ratio: 13 (ref 12–28)
BUN: 13 mg/dL (ref 8–27)
CO2: 28 mmol/L (ref 20–29)
Calcium: 9.5 mg/dL (ref 8.7–10.3)
Chloride: 99 mmol/L (ref 96–106)
Creatinine, Ser: 1 mg/dL (ref 0.57–1.00)
Glucose: 131 mg/dL — ABNORMAL HIGH (ref 70–99)
Potassium: 3.7 mmol/L (ref 3.5–5.2)
Sodium: 140 mmol/L (ref 134–144)
eGFR: 58 mL/min/{1.73_m2} — ABNORMAL LOW (ref >59)

## 2021-03-04 ENCOUNTER — Other Ambulatory Visit: Payer: Self-pay | Admitting: Neurology

## 2021-03-08 ENCOUNTER — Other Ambulatory Visit: Payer: Self-pay | Admitting: Adult Health

## 2021-03-08 DIAGNOSIS — C50111 Malignant neoplasm of central portion of right female breast: Secondary | ICD-10-CM

## 2021-03-08 DIAGNOSIS — C50512 Malignant neoplasm of lower-outer quadrant of left female breast: Secondary | ICD-10-CM

## 2021-03-10 NOTE — Progress Notes (Deleted)
Cardiology Office Note:    Date:  03/10/2021   ID:  Robin Anderson, [place] 27-Nov-1940, MRN [id number]  PCP:  Arthur Holms, NP   [facility] Providers Cardiologist:  None {   Referring MD: Andree Moro, DO    History of Present Illness:    Robin Anderson is a 80 y.o. female with a hx of HTN, HLD, breast cancer and seizure disorder who was previously followed by Truitt Merle who now returns to clinic for CV follow-up.  Last saw me in clinic on 09/26/20 where she was doing well from a CV standpoint. TTE with strain 10/25/20 with LVEF 70-75%, GLS 26.4%, normal diastology, no significant valve disease.  Today ***  Past Medical History:  Diagnosis Date   Blood in stool    Cancer ([place])    breast - left   Cataracts, bilateral    Chest pain, unspecified    Family history of breast cancer    Headache 07/02/2014   Heart murmur    Hepatitis    History of colon polyps    HLD (hyperlipidemia)    HTN (hypertension)    Prediabetes 07/28/2011   no meds, diet controlled   Seizure disorder ([place])    seizures controlled with med   Wears glasses     Past Surgical History:  Procedure Laterality Date   ABDOMINAL HERNIA REPAIR     lower mid line hernia which has reherniatied   BREAST BIOPSY Left 01/31/2019   Malignant   BREAST LUMPECTOMY WITH RADIOACTIVE SEED AND SENTINEL LYMPH NODE BIOPSY N/A 09/05/2019   Procedure: RIGHT BREAST SEED LUMPECTOMY X 1, LEFT BREAST SEED LUMPECTOMY X 2, LEFT AXILLARY SENTINEL LYMPH NODE BIOPSY;  Surgeon: Erroll Luna, MD;  Location: [facility];  Service: General;  Laterality: N/A;   BUNIONECTOMY Right    traumatic injury to her foot that was repaired   RE-EXCISION OF BREAST LUMPECTOMY Left 10/03/2019   Procedure: LEFT BREAST RE-EXCISION OF BREAST LUMPECTOMY;  Surgeon: Erroll Luna, MD;  Location: [place];  Service: General;  Laterality: Left;   TONSILLECTOMY     TUBAL LIGATION      Current Medications: No outpatient medications have been marked  as taking for the 03/20/21 encounter (Appointment) with Freada Bergeron, MD.     Allergies:   Penicillins   Social History   Socioeconomic History   Marital status: Married    Spouse name: [name]   Number of children: 6   Years of education: 12   Highest education level: Not on file  Occupational History   Occupation: retired  Tobacco Use   Smoking status: Former    Types: Cigarettes   Smokeless tobacco: Never   Tobacco comments:    when she was young for a short period of time  Media planner   Vaping Use: Never used  Substance and Sexual Activity   Alcohol use: No    Alcohol/week: 0.0 standard drinks   Drug use: No   Sexual activity: Not on file  Other Topics Concern   Not on file  Social History Narrative   Patient lives at home with her husband Cicely Ortner.   Retired.   Education high school.   Right handed..      Regular exercise-yes   Caffeine Use-yes   Social Determinants of Health   Financial Resource Strain: Not on file  Food Insecurity: Not on file  Transportation Needs: Not on file  Physical Activity: Not on file  Stress: Not on file  Social  Connections: Not on file     Family History: The patient's family history includes Breast cancer in her sister; Heart attack in her daughter and father; Hypertension in her brother, mother, and sister. There is no history of Tremor.  ROS:   Review of Systems  Constitutional:  Negative for chills, diaphoresis, fever and malaise/fatigue.  HENT:  Negative for sore throat.   Respiratory:  Negative for cough and shortness of breath.   Cardiovascular:  Negative for chest pain, palpitations, orthopnea, claudication, leg swelling and PND.  Gastrointestinal:  Negative for abdominal pain, blood in stool, constipation, diarrhea and nausea.  Genitourinary:  Negative for dysuria.  Musculoskeletal:  Negative for joint pain.  Skin:  Negative for rash.  Neurological:  Negative for dizziness, loss of consciousness and  headaches.    EKGs/Labs/Other Studies Reviewed:    The following studies were reviewed today: TTE 11-15-20: IMPRESSIONS     1. Left ventricular ejection fraction, by estimation, is 70 to 75%. Left  ventricular ejection fraction by 3D volume is 78 %. The left ventricle has  hyperdynamic function. The left ventricle has no regional wall motion  abnormalities. Left ventricular  diastolic parameters were normal. The average left ventricular global  longitudinal strain is 26.4 %. The global longitudinal strain is normal.   2. Right ventricular systolic function is hyperdynamic. The right  ventricular size is normal. There is normal pulmonary artery systolic  pressure.   3. The mitral valve is normal in structure. No evidence of mitral valve  regurgitation. No evidence of mitral stenosis.   4. The aortic valve is tricuspid. Aortic valve regurgitation is not  visualized. No aortic stenosis is present.   5. The inferior vena cava is normal in size with greater than 50%  respiratory variability, suggesting right atrial pressure of 3 mmHg.   Conclusion(s)/Recommendation(s): Murmur is probably related to high  cardiac output state (consider anemia, thyrotoxicosis, infection, etc).   EKG:  EKG not performed today  Recent Labs: 02/17/2021: BUN 13; Creatinine, Ser 1.00; Potassium 3.7; Sodium 140  Recent Lipid Panel    Component Value Date/Time   CHOL 172 04/11/2019 1032   TRIG 59 04/11/2019 1032   HDL 79 04/11/2019 1032   CHOLHDL 2.2 04/11/2019 1032   CHOLHDL 2.2 08/27/2015 1402   VLDL 10 08/27/2015 1402   LDLCALC 81 04/11/2019 1032      Physical Exam:    VS:  LMP  (LMP Unknown)     Wt Readings from Last 3 Encounters:  01/20/21 136 lb 6.4 oz (61.9 kg)  09/26/20 134 lb 3.2 oz (60.9 kg)  07/02/20 130 lb (59 kg)     GEN: Well nourished, well developed in no acute distress HEENT: Normal NECK: No JVD; No carotid bruits CARDIAC: RRR, 2/6 sytolic murmurs, rubs,  gallops RESPIRATORY:  Clear to auscultation without rales, wheezing or rhonchi  ABDOMEN: Soft, non-tender, non-distended MUSCULOSKELETAL:  No edema; No deformity  SKIN: Warm and dry NEUROLOGIC:  Alert and oriented x 3 PSYCHIATRIC:  Normal affect   ASSESSMENT:    No diagnosis found.  PLAN:    In order of problems listed above:  #HTN: Well controlled at home. Current medication is getting too expensive and will switch it in the future once completes her current prescription.  -Continue benzapril-HCTZ 20-25mg  daily for now--->will change to lisinopril-HCTZ 20-25mg  daily in the future for cost -Repeat BMET at time of medication switch  #HLD: Monitored by PCP. LDL 81 with goal <100.  -Continue simva 20mg  daily  #  Breast Cancer: Currently in remission. S/p left lumpectomy and 4 weeks of XRT therapy. TTE with strain in 10/2020 with normal EF, normal strain.  -Continue anastrozole 1mg  daily -Follow-up with Onc as scheduled  #Systolic Murmur: Normal valves on TTE. Likely due to high output state with EF 70-75%.  #Seizure Disorder: -Follow-up with Neurology as scheduled     Medication Adjustments/Labs and Tests Ordered: Current medicines are reviewed at length with the patient today.  Concerns regarding medicines are outlined above.  No orders of the defined types were placed in this encounter.  No orders of the defined types were placed in this encounter.   There are no Patient Instructions on file for this visit.     Signed, Freada Bergeron, MD  03/10/2021 5:45 AM    [place]

## 2021-03-13 ENCOUNTER — Other Ambulatory Visit: Payer: Self-pay | Admitting: Neurology

## 2021-03-17 ENCOUNTER — Telehealth: Payer: Self-pay | Admitting: Neurology

## 2021-03-17 NOTE — Telephone Encounter (Signed)
Pt requesting refill for primidone (MYSOLINE) 50 MG tablet. Pharmacy [id number] - [place], [place]

## 2021-03-17 NOTE — Telephone Encounter (Signed)
Refill sent.

## 2021-03-20 ENCOUNTER — Other Ambulatory Visit: Payer: Self-pay

## 2021-03-20 ENCOUNTER — Ambulatory Visit (INDEPENDENT_AMBULATORY_CARE_PROVIDER_SITE_OTHER): Payer: Medicare Other | Admitting: Cardiology

## 2021-03-20 ENCOUNTER — Encounter: Payer: Self-pay | Admitting: Cardiology

## 2021-03-20 VITALS — BP 112/68 | HR 74 | Ht 64.0 in | Wt 134.0 lb

## 2021-03-20 DIAGNOSIS — R011 Cardiac murmur, unspecified: Secondary | ICD-10-CM | POA: Diagnosis not present

## 2021-03-20 DIAGNOSIS — I10 Essential (primary) hypertension: Secondary | ICD-10-CM | POA: Diagnosis not present

## 2021-03-20 DIAGNOSIS — E785 Hyperlipidemia, unspecified: Secondary | ICD-10-CM

## 2021-03-20 DIAGNOSIS — C50512 Malignant neoplasm of lower-outer quadrant of left female breast: Secondary | ICD-10-CM

## 2021-03-20 DIAGNOSIS — Z170 Estrogen receptor positive status [ER+]: Secondary | ICD-10-CM

## 2021-03-20 DIAGNOSIS — G40909 Epilepsy, unspecified, not intractable, without status epilepticus: Secondary | ICD-10-CM

## 2021-03-20 NOTE — Patient Instructions (Signed)
Medication Instructions:   Your physician recommends that you continue on your current medications as directed. Please refer to the Current Medication list given to you today.  *If you need a refill on your cardiac medications before your next appointment, please call your pharmacy*   Follow-Up: At [facility], you and your health needs are our priority.  As part of our continuing mission to provide you with exceptional heart care, we have created designated Provider Care Teams.  These Care Teams include your primary Cardiologist (physician) and Advanced Practice Providers (APPs -  Physician Assistants and Nurse Practitioners) who all work together to provide you with the care you need, when you need it.  We recommend signing up for the patient portal called "MyChart".  Sign up information is provided on this After Visit Summary.  MyChart is used to connect with patients for Virtual Visits (Telemedicine).  Patients are able to view lab/test results, encounter notes, upcoming appointments, etc.  Non-urgent messages can be sent to your provider as well.   To learn more about what you can do with MyChart, go to [email or web address].    Your next appointment:   6 month(s)  The format for your next appointment:   In Person  Provider:    DR. Johney Frame  If MD is not listed, click here to update    :1}

## 2021-03-20 NOTE — Progress Notes (Signed)
Cardiology Office Note:    Date:  03/20/2021   ID:  Robin Anderson, [place] December 19, 1940, MRN [id number]  PCP:  Arthur Holms, NP   [facility] Providers Cardiologist:  None {   Referring MD: Andree Moro, DO    History of Present Illness:    Robin Anderson is a 80 y.o. female with a hx of HTN, HLD, breast cancer and seizure disorder who was previously followed by Truitt Merle who now returns to clinic for CV follow-up.  Last saw me in clinic on 09/26/20 where she was doing well from a CV standpoint. TTE with strain 10/25/20 with LVEF 70-75%, GLS 26.4%, normal diastology, no significant valve disease.  Today, she is doing well. She records her blood pressure at home and reports her measurements have been good running mainly <120s at home. She denies any palpitations, chest pain, or shortness of breath, lightheadedness, lower extremity edema or exertional symptoms. She denies thyroid dysfunction. States she has been frustrated with her pharmacy as her eye drops were 500$ and they forgot to give her one of her seizure medications. She is hoping to switch pharmacies soon.  Past Medical History:  Diagnosis Date   Blood in stool    Cancer ([place])    breast - left   Cataracts, bilateral    Chest pain, unspecified    Family history of breast cancer    Headache 07/02/2014   Heart murmur    Hepatitis    History of colon polyps    HLD (hyperlipidemia)    HTN (hypertension)    Prediabetes 07/28/2011   no meds, diet controlled   Seizure disorder ([place])    seizures controlled with med   Wears glasses     Past Surgical History:  Procedure Laterality Date   ABDOMINAL HERNIA REPAIR     lower mid line hernia which has reherniatied   BREAST BIOPSY Left 01/31/2019   Malignant   BREAST LUMPECTOMY WITH RADIOACTIVE SEED AND SENTINEL LYMPH NODE BIOPSY N/A 09/05/2019   Procedure: RIGHT BREAST SEED LUMPECTOMY X 1, LEFT BREAST SEED LUMPECTOMY X 2, LEFT AXILLARY SENTINEL LYMPH NODE BIOPSY;   Surgeon: Erroll Luna, MD;  Location: [place];  Service: General;  Laterality: N/A;   BUNIONECTOMY Right    traumatic injury to her foot that was repaired   RE-EXCISION OF BREAST LUMPECTOMY Left 10/03/2019   Procedure: LEFT BREAST RE-EXCISION OF BREAST LUMPECTOMY;  Surgeon: Erroll Luna, MD;  Location: [place];  Service: General;  Laterality: Left;   TONSILLECTOMY     TUBAL LIGATION      Current Medications: Current Meds  Medication Sig   anastrozole (ARIMIDEX) 1 MG tablet TAKE 1 TABLET BY MOUTH EVERY DAY   aspirin EC 81 MG tablet Take 81 mg by mouth daily.   Cholecalciferol (VITAMIN D3) 50 MCG (2000 UT) TABS Take 2,000 Units by mouth daily.   lisinopril-hydrochlorothiazide (ZESTORETIC) 20-25 MG tablet Take 1 tablet by mouth daily.   potassium chloride SA (KLOR-CON) 20 MEQ tablet Take 1 tablet (20 mEq total) by mouth daily. Take for 3 days only.   primidone (MYSOLINE) 50 MG tablet TAKE 3 TABLETS BY MOUTH AT BEDTIME   simvastatin (ZOCOR) 20 MG tablet TAKE 1 TABLET BY MOUTH EVERYDAY AT BEDTIME   XIIDRA 5 % SOLN      Allergies:   Penicillins   Social History   Socioeconomic History   Marital status: Married    Spouse name: Robin Anderson   Number of children: 6   Years  of education: 12   Highest education level: Not on file  Occupational History   Occupation: retired  Tobacco Use   Smoking status: Former    Types: Cigarettes   Smokeless tobacco: Never   Tobacco comments:    when she was young for a short period of time  Vaping Use   Vaping Use: Never used  Substance and Sexual Activity   Alcohol use: No    Alcohol/week: 0.0 standard drinks   Drug use: No   Sexual activity: Not on file  Other Topics Concern   Not on file  Social History Narrative   Patient lives at home with her husband Robin Anderson.   Retired.   Education high school.   Right handed..      Regular exercise-yes   Caffeine Use-yes   Social Determinants of Health   Financial Resource Strain: Not on  file  Food Insecurity: Not on file  Transportation Needs: Not on file  Physical Activity: Not on file  Stress: Not on file  Social Connections: Not on file     Family History: The patient's family history includes Breast cancer in her sister; Heart attack in her daughter and father; Hypertension in her brother, mother, and sister. There is no history of Tremor.  ROS:   Review of Systems  Constitutional:  Negative for chills, diaphoresis, fever and malaise/fatigue.  HENT:  Negative for sore throat.   Eyes:  Negative for blurred vision.  Respiratory:  Negative for cough and shortness of breath.   Cardiovascular:  Negative for chest pain, palpitations, orthopnea, claudication, leg swelling and PND.  Gastrointestinal:  Negative for abdominal pain, blood in stool, constipation, diarrhea and nausea.  Genitourinary:  Negative for dysuria.  Musculoskeletal:  Negative for joint pain.  Skin:  Negative for rash.  Neurological:  Negative for dizziness, loss of consciousness and headaches.  Endo/Heme/Allergies:  Does not bruise/bleed easily.  Psychiatric/Behavioral:  The patient does not have insomnia.     EKGs/Labs/Other Studies Reviewed:    The following studies were reviewed today: TTE 14-Nov-2020: IMPRESSIONS   1. Left ventricular ejection fraction, by estimation, is 70 to 75%. Left  ventricular ejection fraction by 3D volume is 78 %. The left ventricle has  hyperdynamic function. The left ventricle has no regional wall motion  abnormalities. Left ventricular diastolic parameters were normal. The average left ventricular global longitudinal strain is 26.4 %. The global longitudinal strain is normal.   2. Right ventricular systolic function is hyperdynamic. The right  ventricular size is normal. There is normal pulmonary artery systolic  pressure.   3. The mitral valve is normal in structure. No evidence of mitral valve  regurgitation. No evidence of mitral stenosis.   4. The aortic valve  is tricuspid. Aortic valve regurgitation is not  visualized. No aortic stenosis is present.   5. The inferior vena cava is normal in size with greater than 50%  respiratory variability, suggesting right atrial pressure of 3 mmHg.  Conclusion(s)/Recommendation(s): Murmur is probably related to high cardiac output state (consider anemia, thyrotoxicosis, infection, etc).   EKG:   03/20/21: Sinus rhythm, rate 74 bpm   Recent Labs: 02/17/2021: BUN 13; Creatinine, Ser 1.00; Potassium 3.7; Sodium 140  Recent Lipid Panel    Component Value Date/Time   CHOL 172 04/11/2019 1032   TRIG 59 04/11/2019 1032   HDL 79 04/11/2019 1032   CHOLHDL 2.2 04/11/2019 1032   CHOLHDL 2.2 08/27/2015 1402   VLDL 10 08/27/2015 1402   LDLCALC 81  04/11/2019 1032      Physical Exam:    VS:  BP 112/68   Pulse 74   Ht 5\' 4"  (1.626 m)   Wt 134 lb (60.8 kg)   LMP  (LMP Unknown)   SpO2 99%   BMI 23.00 kg/m     Wt Readings from Last 3 Encounters:  03/20/21 134 lb (60.8 kg)  01/20/21 136 lb 6.4 oz (61.9 kg)  09/26/20 134 lb 3.2 oz (60.9 kg)     GEN: Comfortable, NAD HEENT: Normal NECK: No JVD; No carotid bruits CARDIAC: RRR, 1/6 sytolic murmur, no rubs, gallops RESPIRATORY:  Clear to auscultation without rales, wheezing or rhonchi  ABDOMEN: Soft, non-tender, non-distended MUSCULOSKELETAL:  No edema; No deformity  SKIN: Warm and dry NEUROLOGIC:  Alert and oriented x 3 PSYCHIATRIC:  Normal affect   ASSESSMENT:    1. Essential hypertension   2. Systolic murmur   3. Seizure disorder ([place])   4. Hyperlipidemia, unspecified hyperlipidemia type   5. Malignant neoplasm of lower-outer quadrant of left breast of female, estrogen receptor positive ([place])     PLAN:    In order of problems listed above:  #HTN: Well controlled at home and at goal <120s/80 -Continue lisinopril-HCTZ 20-25mg  daily   #HLD: Monitored by PCP. LDL 81 with goal <100.  -Continue simva 20mg  daily  #Breast Cancer: Currently  in remission. S/p left lumpectomy and 4 weeks of XRT therapy. TTE with strain in 10/2020 with normal EF, normal strain.  -Continue anastrozole 1mg  daily -Follow-up with Onc as scheduled  #Systolic Murmur: Normal valves on TTE. Likely due to high output state with EF 70-75%.  #Seizure Disorder: -Follow-up with Neurology as scheduled     Medication Adjustments/Labs and Tests Ordered: Current medicines are reviewed at length with the patient today.  Concerns regarding medicines are outlined above.  Orders Placed This Encounter  Procedures   EKG 12-Lead    No orders of the defined types were placed in this encounter.   Patient Instructions  Medication Instructions:   Your physician recommends that you continue on your current medications as directed. Please refer to the Current Medication list given to you today.  *If you need a refill on your cardiac medications before your next appointment, please call your pharmacy*   Follow-Up: At [facility], you and your health needs are our priority.  As part of our continuing mission to provide you with exceptional heart care, we have created designated Provider Care Teams.  These Care Teams include your primary Cardiologist (physician) and Advanced Practice Providers (APPs -  Physician Assistants and Nurse Practitioners) who all work together to provide you with the care you need, when you need it.  We recommend signing up for the patient portal called "MyChart".  Sign up information is provided on this After Visit Summary.  MyChart is used to connect with patients for Virtual Visits (Telemedicine).  Patients are able to view lab/test results, encounter notes, upcoming appointments, etc.  Non-urgent messages can be sent to your provider as well.   To learn more about what you can do with MyChart, go to [email or web address].    Your next appointment:   6 month(s)  The format for your next appointment:   In Person  Provider:    DR.  Johney Frame  If MD is not listed, click here to update    :1}      I,Mykaella Javier,acting as a scribe for Freada Bergeron, MD.,have documented all relevant documentation on the behalf  of Freada Bergeron, MD,as directed by  Freada Bergeron, MD while in the presence of Freada Bergeron, MD.  I, Freada Bergeron, MD, have reviewed all documentation for this visit. The documentation on 03/20/21 for the exam, diagnosis, procedures, and orders are all accurate and complete.   Signed, Freada Bergeron, MD  03/20/2021 10:06 AM    [place]

## 2021-05-16 ENCOUNTER — Other Ambulatory Visit: Payer: Self-pay | Admitting: Surgery

## 2021-05-16 DIAGNOSIS — Z170 Estrogen receptor positive status [ER+]: Secondary | ICD-10-CM

## 2021-05-16 DIAGNOSIS — C50212 Malignant neoplasm of upper-inner quadrant of left female breast: Secondary | ICD-10-CM

## 2021-06-30 NOTE — Progress Notes (Incomplete)
Patient Care Team: Arthur Holms, NP as PCP - General (Nurse Practitioner) Mauro Kaufmann, RN as Oncology Nurse Navigator Rockwell Germany, RN as Oncology Nurse Navigator Eppie Gibson, MD as Attending Physician (Radiation Oncology) Erroll Luna, MD as Consulting Physician (General Surgery) Nicholas Lose, MD as Consulting Physician (Hematology and Oncology)  DIAGNOSIS: No diagnosis found.  SUMMARY OF ONCOLOGIC HISTORY: Oncology History  Malignant neoplasm of lower-outer quadrant of left breast of female, estrogen receptor positive ([place])  01/31/2019 Initial Diagnosis   Palpable lump in the left breast x4 months mammogram 01/27/2019: 2.5 cm mass at 6 o'clock position, 0.7 cm mass retroareolar, axilla negative: Grade 2 IDC ER 100%, PR 100%, HER-2 negative, Ki-67 15% T2N0 stage IB   02/17/2019 Cancer Staging   Staging form: Breast, AJCC 8th Edition - Clinical stage from 02/17/2019: Stage IB (cT2, cN0, cM0, G2, ER+, PR+, HER2-)   01/2019 -  Anti-estrogen oral therapy   Anastrozole   09/05/2019 Surgery   Bilateral lumpectomies ([name]) ([id number]):   Left: IDC, grade 2, 3.0cm, with intermediate grade DCIS, 2 left axillary lymph nodes negative.  Margins positive. Right: No residual carcinoma.  10/03/2019: Resection of margins ([name]) ([id number]): negative for malignancy.   09/05/2019 Cancer Staging   Staging form: Breast, AJCC 8th Edition - Pathologic stage from 09/05/2019: No Stage Recommended (ypT2, pN0, cM0, G2, ER+, PR+, HER2-)   11/06/2019 - 12/01/2019 Radiation Therapy   The patient initially received a dose of 40.05 Gy in 15 fractions to the RIGHT breast and 40.05 Gy in 15 fractions to the LEFT breast using whole-breast tangent fields. This was delivered using a 3-D conformal technique. The pt received a boost to the LEFT breast delivering an additional 10 Gy in 5 fractions using a electron boost with 66mV electrons. The total dose was 50.05 Gy.    Malignant  neoplasm of central portion of right breast in female, estrogen receptor positive ([place]  09/05/2018 Cancer Staging   Staging form: Breast, AJCC 8th Edition - Pathologic stage from 09/05/2018: No Stage Recommended (ypT0, pN0, cM0)   01/2019 -  Anti-estrogen oral therapy   Anastrozole   03/10/2019 Cancer Staging   Staging form: Breast, AJCC 8th Edition - Clinical stage from 03/10/2019: Stage IA (cT169m cN0, cM0, G1, ER+, PR+, HER2-)   09/05/2019 Surgery   Bilateral lumpectomies ([name]) (M([id number]   Left: IDC, grade 2, 3.0cm, with intermediate grade DCIS, 2 left axillary lymph nodes negative.  Margins positive. Right: No residual carcinoma.  10/03/2019: Resection of margins ([name]) (M([id number] negative for malignancy.   09/20/2019 Initial Diagnosis   [place]alignant [place] of central portion of right breast in [place], [place]  11/06/2019 - 12/01/2019 Radiation Therapy   The patient initially received a dose of 40.05 Gy in 15 fractions to the RIGHT breast and 40.05 Gy in 15 fractions to the LEFT breast using whole-breast tangent fields. This was delivered using a 3-D conformal technique. The pt received a boost to the LEFT breast delivering an additional 10 Gy in 5 fractions using a electron boost with 1569melectrons. The total dose was 50.05 Gy.      CHIEF COMPLIANT: Follow-up of left breast cancer on anastrozole  INTERVAL HISTORY: Robin Anderson a 79 79o. with above-mentioned history of left breast cancer who was on neoadjuvant antiestrogen therapy with anastrozole, underwent a left lumpectomy, radiation, and is currently on antiestrogen therapy with anastrozole. Bone density scan on 03/08/20 showed osteopenia with a T-score of -1.3 at the right femoral  neck. She presents to the clinic today for follow-up. She states   ALLERGIES:  is allergic to penicillins.  MEDICATIONS:  Current Outpatient Medications  Medication Sig Dispense Refill    anastrozole (ARIMIDEX) 1 MG tablet TAKE 1 TABLET BY MOUTH EVERY DAY 90 tablet 4   aspirin EC 81 MG tablet Take 81 mg by mouth daily.     Cholecalciferol (VITAMIN D3) 50 MCG (2000 UT) TABS Take 2,000 Units by mouth daily.     lisinopril-hydrochlorothiazide (ZESTORETIC) 20-25 MG tablet Take 1 tablet by mouth daily. 90 tablet 1   potassium chloride SA (KLOR-CON) 20 MEQ tablet Take 1 tablet (20 mEq total) by mouth daily. Take for 3 days only. 3 tablet 0   primidone (MYSOLINE) 50 MG tablet TAKE 3 TABLETS BY MOUTH AT BEDTIME 270 tablet 1   simvastatin (ZOCOR) 20 MG tablet TAKE 1 TABLET BY MOUTH EVERYDAY AT BEDTIME 90 tablet 2   XIIDRA 5 % SOLN      No current facility-administered medications for this visit.    PHYSICAL EXAMINATION: ECOG PERFORMANCE STATUS: {CHL ONC ECOG PS:[id number]}  There were no vitals filed for this visit. There were no vitals filed for this visit.  BREAST:*** No palpable masses or nodules in either right or left breasts. No palpable axillary supraclavicular or infraclavicular adenopathy no breast tenderness or nipple discharge. (exam performed in the presence of a chaperone)  LABORATORY DATA:  I have reviewed the data as listed CMP Latest Ref Rng & Units 02/17/2021 02/04/2021 10/17/2019  Glucose 70 - 99 mg/dL 131(H) 169(H) 74  BUN 8 - 27 mg/dL _0 Creatinine 0.57 - 1.00 mg/dL 1.00 1.06(H) 0.96  Sodium 134 - 144 mmol/L 140 136 140  Potassium 3.5 - 5.2 mmol/L 3.7 3.3(L) 3.5  Chloride 96 - 106 mmol/L 99 96 99  CO2 20 - 29 mmol/L _1 Calcium 8.7 - 10.3 mg/dL 9.5 9.3 9.6  Total Protein 6.5 - 8.1 g/dL - - -  Total Bilirubin 0.3 - 1.2 mg/dL - - -  Alkaline Phos 38 - 126 U/L - - -  AST 15 - 41 U/L - - -  ALT 0 - 44 U/L - - -    Lab Results  Component Value Date   WBC 6.5 10/03/2019   HGB 12.4 10/03/2019   HCT 38.9 10/03/2019   MCV 88.4 10/03/2019   PLT 220 10/03/2019   NEUTROABS 4.6 10/03/2019    ASSESSMENT & PLAN:  No problem-specific Assessment  & Plan notes found for this encounter.    No orders of the defined types were placed in this encounter.  The patient has a good understanding of the overall plan. she agrees with it. she will call with any problems that may develop before the next visit here. Total time spent: 30 mins including face to face time and time spent for planning, charting and co-ordination of care   Suzzette Righter, [facility] 06/30/21    I, Gardiner Coins, am acting as a scribe for Dr. Lindi Adie

## 2021-07-02 ENCOUNTER — Other Ambulatory Visit: Payer: Self-pay

## 2021-07-02 ENCOUNTER — Inpatient Hospital Stay: Payer: Medicare HMO | Attending: Hematology and Oncology | Admitting: Hematology and Oncology

## 2021-07-02 ENCOUNTER — Ambulatory Visit: Payer: Self-pay | Admitting: Surgery

## 2021-07-02 DIAGNOSIS — C50512 Malignant neoplasm of lower-outer quadrant of left female breast: Secondary | ICD-10-CM | POA: Diagnosis not present

## 2021-07-02 DIAGNOSIS — Z79899 Other long term (current) drug therapy: Secondary | ICD-10-CM | POA: Insufficient documentation

## 2021-07-02 DIAGNOSIS — C50111 Malignant neoplasm of central portion of right female breast: Secondary | ICD-10-CM | POA: Diagnosis present

## 2021-07-02 DIAGNOSIS — Z79811 Long term (current) use of aromatase inhibitors: Secondary | ICD-10-CM | POA: Diagnosis not present

## 2021-07-02 DIAGNOSIS — Z170 Estrogen receptor positive status [ER+]: Secondary | ICD-10-CM | POA: Insufficient documentation

## 2021-07-02 DIAGNOSIS — Z7982 Long term (current) use of aspirin: Secondary | ICD-10-CM | POA: Diagnosis not present

## 2021-07-02 MED ORDER — ANASTROZOLE 1 MG PO TABS: 1.0000 mg | ORAL_TABLET | Freq: Every day | ORAL | 4 refills | Status: DC

## 2021-07-02 NOTE — Assessment & Plan Note (Addendum)
01/31/2019:Palpable lump in the left breast x4 months mammogram 01/27/2019: 2.5 cm mass at 6 o'clock position, 0.7 cm mass retroareolar, axilla negative: Grade 2 IDC ER 100%, PR 100%, HER-2 negative, Ki-67 15%  ?T2N0 stage IB ?03/10/2019: Biopsy of the right breast: DCIS, ER 80%, PR 40% ?? ?Recommendations: ?1.?Neoadjuvant antiestrogen therapy anastrozole 1 mg daily 02/17/2019 ?2.?Breast conserving surgery?09/05/2019 ?3. Adjuvant radiation therapy followed by?(patient is also not keen on adjuvant radiation) ?4.??Continuation of adjuvant antiestrogen therapy ?--------------------------------------------------------------------------------------------------------------------------- ?Current treatment: Started as neoadjuvant therapy with anastrozole 1 mg daily started 02/17/2019, now on adjuvant anastrozole ?Anastrozole toxicities:?Tolerating it well ? ?? ?09/05/2019:Left lumpectomy ([name]): IDC, grade 2, 3.0cm, with intermediate grade DCIS, 2 left axillary lymph nodes negative.??Invasive carcinoma present posterior margin focally, inferior margin focally, anterior superior margin broadly and anterior margin broadly. ?Right lumpectomy: Benign ?10/03/2019: Resection of the margins: Negative for malignancy. ?? ?Adjuvant radiation 11/07/2019-12/01/2019 ?? ?Palpable nodules on the abdomen and the chest wall: Patient saw Dr. Brantley Stage who is planning to do a biopsy.  She also has mammograms set up for tomorrow. ?? ?Breast cancer surveillance: ?1. Breast exam 07/02/2021: Left breast: Large palpable abnormality that is freely mobile in the left breast. ?2. mammogram 07/04/2020: Benign, density cat B ?? ?return to clinic in 1 year for follow-up ?? ?

## 2021-07-07 ENCOUNTER — Ambulatory Visit
Admission: RE | Admit: 2021-07-07 | Discharge: 2021-07-07 | Disposition: A | Discharge status: Home / Self Care | Payer: Medicare HMO | Source: Ambulatory Visit | Attending: Surgery | Admitting: Surgery

## 2021-07-07 ENCOUNTER — Other Ambulatory Visit: Payer: Self-pay | Admitting: Surgery

## 2021-07-07 DIAGNOSIS — C50212 Malignant neoplasm of upper-inner quadrant of left female breast: Secondary | ICD-10-CM

## 2021-07-07 IMAGING — US US BREAST*R* LIMITED INC AXILLA
1 series · 12 of 12 positions shown · non-contrast
Comparison: Previous exam(s).

CLINICAL DATA: 80-year-old female status post bilateral breast
cancer, status post lumpectomy in [SB]. Palpable lump in the far
medial right breast.

EXAM:
DIGITAL DIAGNOSTIC BILATERAL MAMMOGRAM WITH TOMOSYNTHESIS AND CAD;
ULTRASOUND RIGHT BREAST LIMITED
TECHNIQUE: Bilateral digital diagnostic mammography and breast tomosynthesis
was performed. The images were evaluated with computer-aided
detection.; Targeted ultrasound examination of the right breast was
performed

[Series 1: us breast*right* limited inc axilla · 0.05mm/px · 12 acquisitions, 12 frames shown]
[im 1/12]
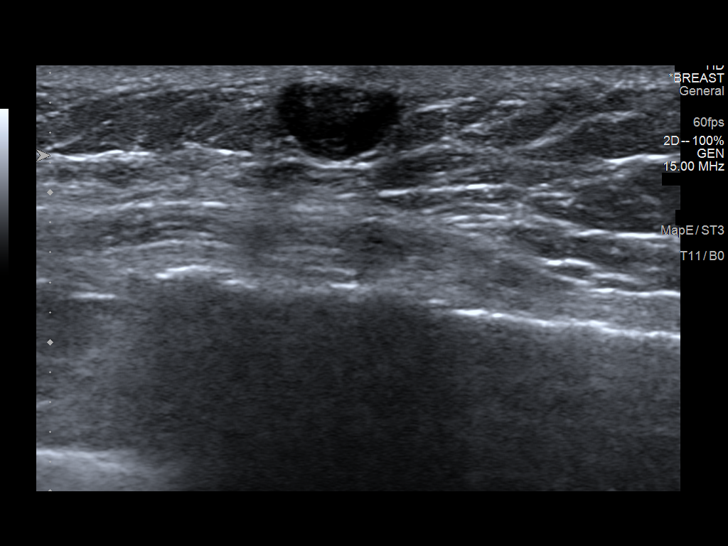
[im 2/12]
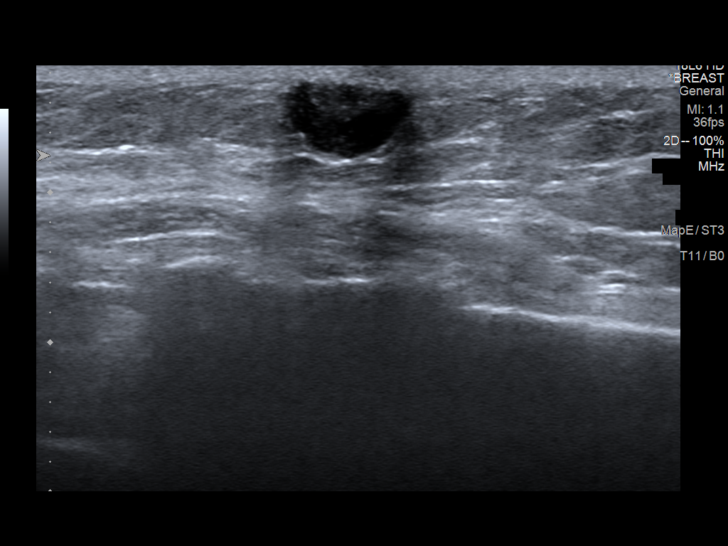
[im 3/12]
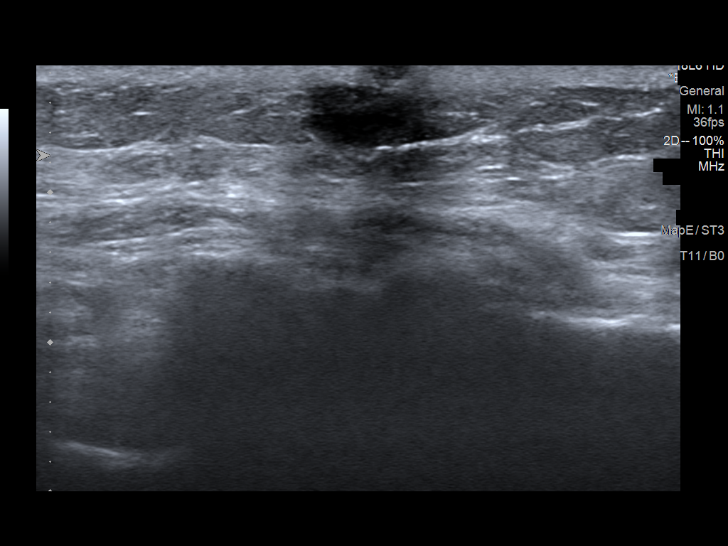
[im 4/12]
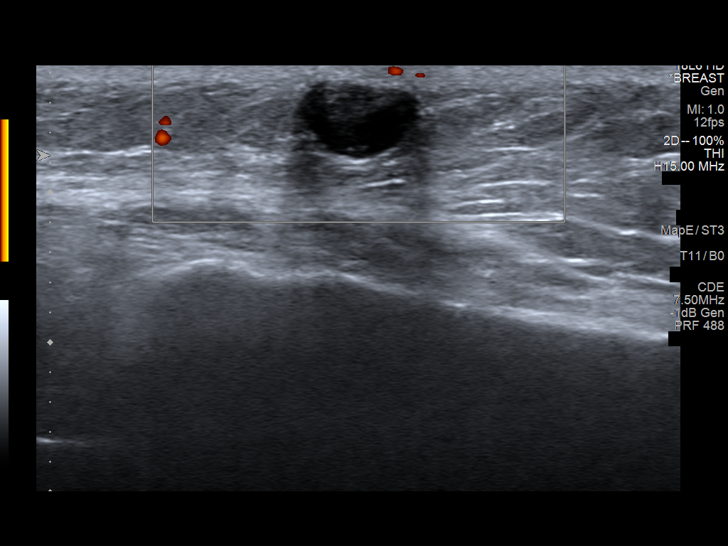
[im 5/12]
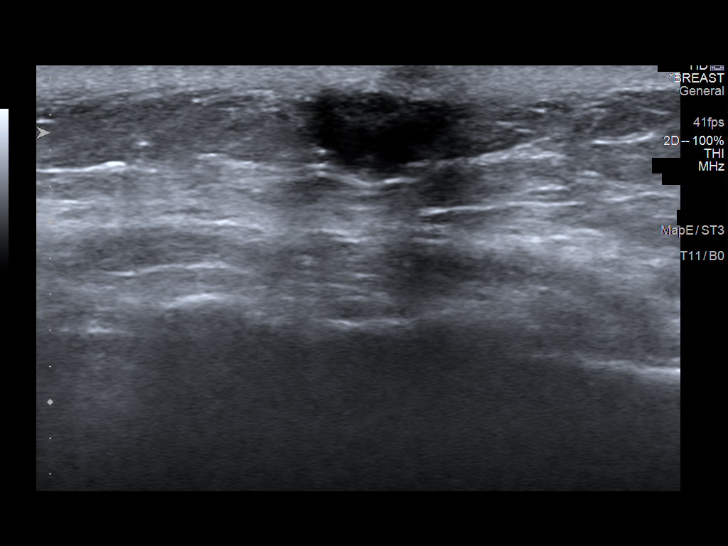
[im 6/12]
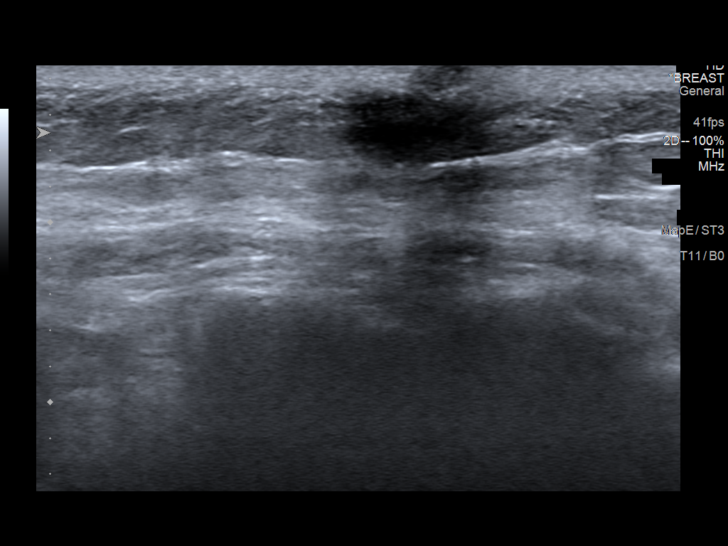
[im 7/12]
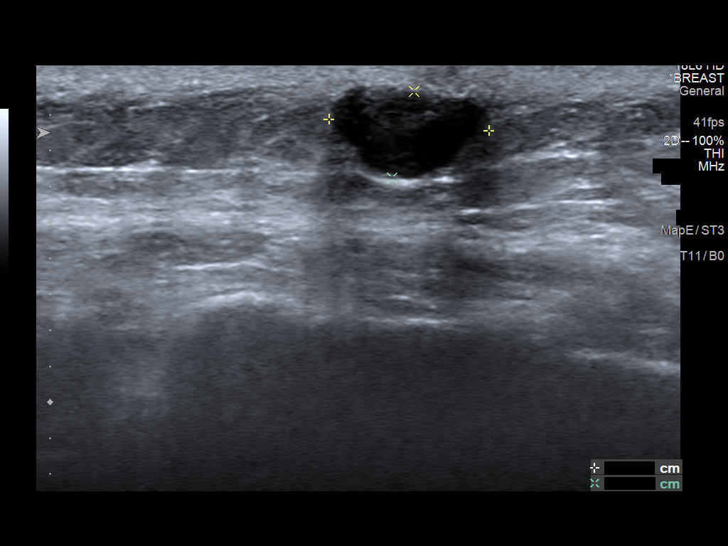
[im 8/12]
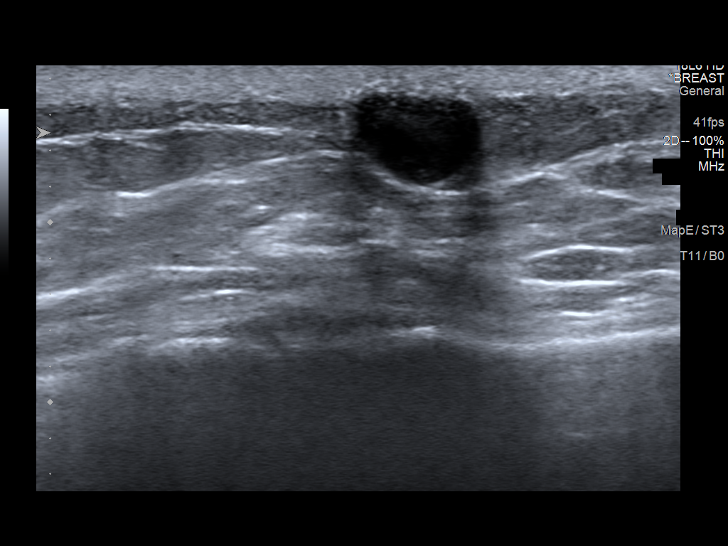
[im 9/12]
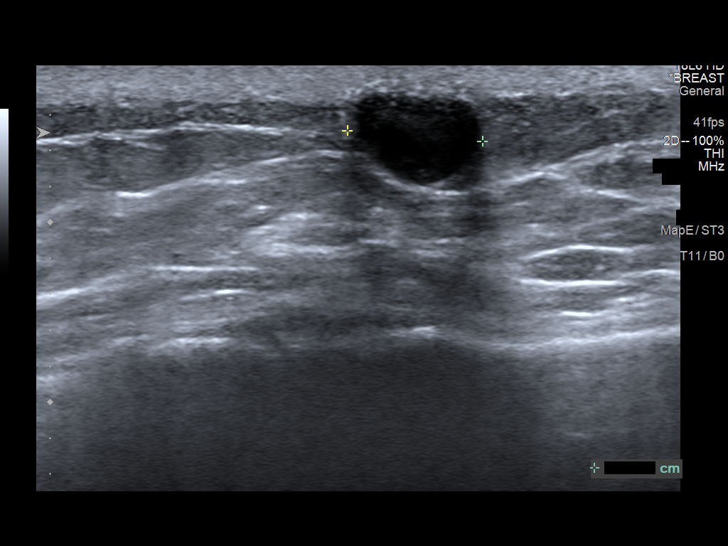
[im 10/12]
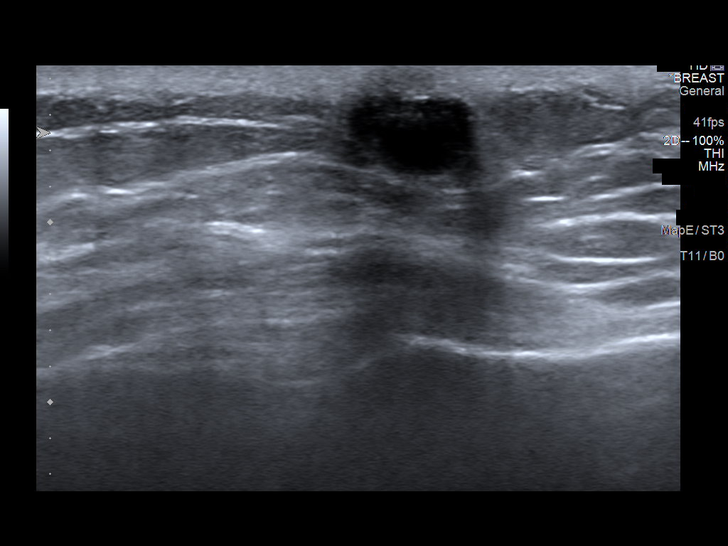
[im 11/12]
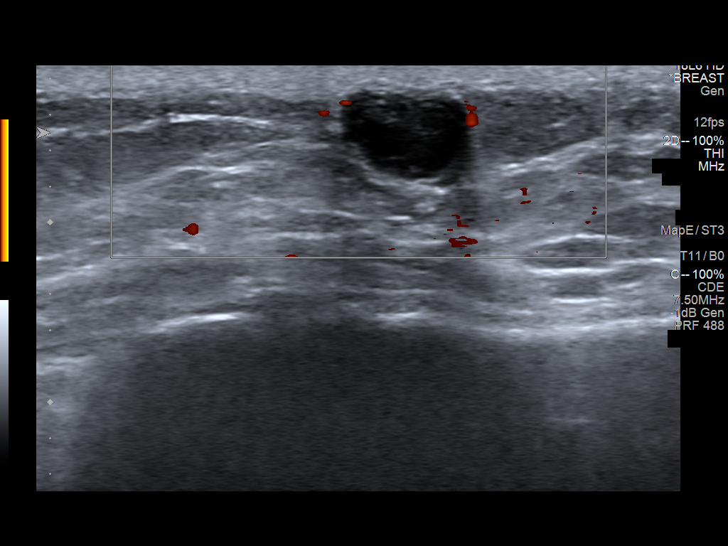
[im 12/12]
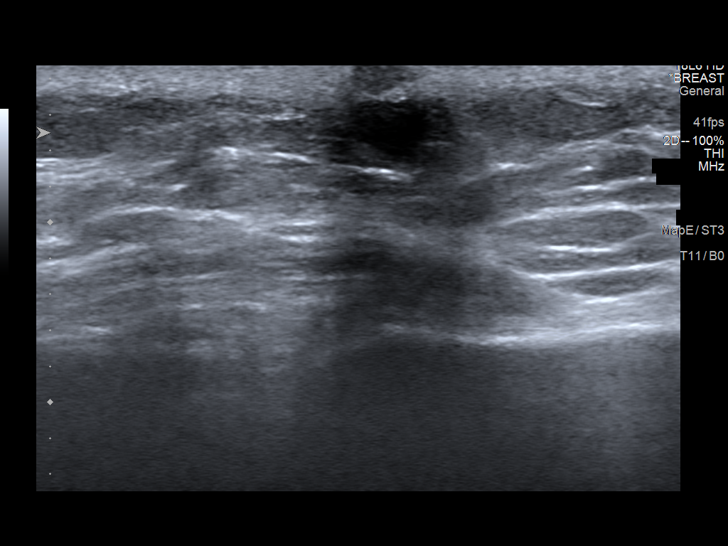

[12 of 12 positions shown; findings below may reference images not displayed]

ACR Breast Density Category c: The breast tissue is heterogeneously
dense, which may obscure small masses.
FINDINGS: Stable postsurgical changes bilaterally. No new or suspicious
findings. The palpable lump in the medial right breast corresponds
with a blackhead identified with a mole marker.

Targeted ultrasound is performed, showing a circumscribed mixed
echogenicity mass just below the dermis at the [DATE] position 10 cm
from the nipple. It measures 9 x 5 x 8 mm. There is peripheral
vascularity and a tract to the overlying blackhead identified on the
skin.
IMPRESSION: 1. Bilateral post lumpectomy changes without mammographic evidence
of malignancy in either breast.
2. Benign medial right breast lumpectomy.

RECOMMENDATION:
Per protocol, as the patient is now 2 or more years status post
lumpectomy, she may return to annual screening mammography in 1
year. However, given the history of breast cancer, the patient
remains eligible for annual diagnostic mammography if preferred.

I have discussed the findings and recommendations with the patient.
If applicable, a reminder letter will be sent to the patient
regarding the next appointment.

BI-RADS CATEGORY  2: Benign.

## 2021-07-25 ENCOUNTER — Other Ambulatory Visit: Payer: Self-pay | Admitting: *Deleted

## 2021-07-25 DIAGNOSIS — Z79899 Other long term (current) drug therapy: Secondary | ICD-10-CM

## 2021-07-25 DIAGNOSIS — I10 Essential (primary) hypertension: Secondary | ICD-10-CM

## 2021-07-25 MED ORDER — LISINOPRIL-HYDROCHLOROTHIAZIDE 20-25 MG PO TABS: 1.0000 | ORAL_TABLET | Freq: Every day | ORAL | 0 refills | Status: DC

## 2021-09-08 ENCOUNTER — Other Ambulatory Visit: Payer: Self-pay | Admitting: Adult Health

## 2021-09-22 NOTE — Progress Notes (Deleted)
Cardiology Office Note:    Date:  09/26/2021   ID:  Robin Anderson, Robin Anderson, MRN [id number]  PCP:  Robin Holms, NP   [facility] Providers Cardiologist:  None {   Referring MD: Robin Holms, NP    History of Present Illness:    Robin Anderson is a 81 y.o. female with a hx of HTN, HLD, breast cancer and seizure disorder who was previously followed by Truitt Merle who now returns to clinic for CV follow-up.  Seen in clinic on 09/26/20 where she was doing well from a CV standpoint. TTE with strain 10/25/20 with LVEF 70-75%, GLS 26.4%, normal diastology, no significant valve disease.  Was last seen in 03/2021 where she was doing very well. BP well controlled.  Today, ***  Past Medical History:  Diagnosis Date   Blood in stool    Cancer ([place])    breast - left   Cataracts, bilateral    Chest pain, unspecified    Family history of breast cancer    Headache 07/02/2014   Heart murmur    Hepatitis    History of colon polyps    HLD (hyperlipidemia)    HTN (hypertension)    Prediabetes 07/28/2011   no meds, diet controlled   Seizure disorder ([place])    seizures controlled with med   Wears glasses     Past Surgical History:  Procedure Laterality Date   ABDOMINAL HERNIA REPAIR     lower mid line hernia which has reherniatied   BREAST BIOPSY Left 01/31/2019   Malignant   BREAST LUMPECTOMY Left 09/05/2019   re-excision 10/03/19   BREAST LUMPECTOMY WITH RADIOACTIVE SEED AND SENTINEL LYMPH NODE BIOPSY N/A 09/05/2019   Procedure: RIGHT BREAST SEED LUMPECTOMY X 1, LEFT BREAST SEED LUMPECTOMY X 2, LEFT AXILLARY SENTINEL LYMPH NODE BIOPSY;  Surgeon: Erroll Luna, MD;  Location: [place];  Service: General;  Laterality: N/A;   BUNIONECTOMY Right    traumatic injury to her foot that was repaired   RE-EXCISION OF BREAST LUMPECTOMY Left 10/03/2019   Procedure: LEFT BREAST RE-EXCISION OF BREAST LUMPECTOMY;  Surgeon: Erroll Luna, MD;  Location: [facility];  Service:  General;  Laterality: Left;   TONSILLECTOMY     TUBAL LIGATION      Current Medications: Current Meds  Medication Sig   anastrozole (ARIMIDEX) 1 MG tablet Take 1 tablet (1 mg total) by mouth daily.   aspirin EC 81 MG tablet Take 81 mg by mouth daily.   Cholecalciferol (VITAMIN D3) 50 MCG (2000 UT) TABS Take 2,000 Units by mouth daily.   lisinopril-hydrochlorothiazide (ZESTORETIC) 20-25 MG tablet Take 1 tablet by mouth daily.   potassium chloride SA (KLOR-CON) 20 MEQ tablet Take 1 tablet (20 mEq total) by mouth daily. Take for 3 days only.   primidone (MYSOLINE) 50 MG tablet TAKE 3 TABLETS BY MOUTH AT BEDTIME   simvastatin (ZOCOR) 20 MG tablet TAKE 1 TABLET BY MOUTH EVERYDAY AT BEDTIME     Allergies:   Penicillins   Social History   Socioeconomic History   Marital status: Married    Spouse name: [name]   Number of children: 6   Years of education: 12   Highest education level: Not on file  Occupational History   Occupation: retired  Tobacco Use   Smoking status: Former    Types: Cigarettes   Smokeless tobacco: Never   Tobacco comments:    when she was young for a short period of time  Media planner  Vaping Use: Never used  Substance and Sexual Activity   Alcohol use: No    Alcohol/week: 0.0 standard drinks of alcohol   Drug use: No   Sexual activity: Not on file  Other Topics Concern   Not on file  Social History Narrative   Patient lives at home with her husband [name].   Retired.   Education high school.   Right handed..      Regular exercise-yes   Caffeine Use-yes   Social Determinants of Health   Financial Resource Strain: Not on file  Food Insecurity: Not on file  Transportation Needs: No Transportation Needs (02/14/2019)   PRAPARE - Hydrologist (Medical): No    Lack of Transportation (Non-Medical): No  Physical Activity: Not on file  Stress: Not on file  Social Connections: Not on file     Family History: The  patient's family history includes Breast cancer in her sister; Heart attack in her daughter and father; Hypertension in her brother, mother, and sister. There is no history of Tremor.  ROS:   Review of Systems  Constitutional:  Negative for chills, diaphoresis, fever and malaise/fatigue.  HENT:  Negative for sore throat.   Eyes:  Negative for blurred vision.  Respiratory:  Negative for cough and shortness of breath.   Cardiovascular:  Negative for chest pain, palpitations, orthopnea, claudication, leg swelling and PND.  Gastrointestinal:  Negative for abdominal pain, blood in stool, constipation, diarrhea and nausea.  Genitourinary:  Negative for dysuria.  Musculoskeletal:  Negative for joint pain.  Skin:  Negative for rash.  Neurological:  Negative for dizziness, loss of consciousness and headaches.  Endo/Heme/Allergies:  Does not bruise/bleed easily.  Psychiatric/Behavioral:  The patient does not have insomnia.      EKGs/Labs/Other Studies Reviewed:    The following studies were reviewed today: TTE 2020-10-31: IMPRESSIONS   1. Left ventricular ejection fraction, by estimation, is 70 to 75%. Left  ventricular ejection fraction by 3D volume is 78 %. The left ventricle has  hyperdynamic function. The left ventricle has no regional wall motion  abnormalities. Left ventricular diastolic parameters were normal. The average left ventricular global longitudinal strain is 26.4 %. The global longitudinal strain is normal.   2. Right ventricular systolic function is hyperdynamic. The right  ventricular size is normal. There is normal pulmonary artery systolic  pressure.   3. The mitral valve is normal in structure. No evidence of mitral valve  regurgitation. No evidence of mitral stenosis.   4. The aortic valve is tricuspid. Aortic valve regurgitation is not  visualized. No aortic stenosis is present.   5. The inferior vena cava is normal in size with greater than 50%  respiratory  variability, suggesting right atrial pressure of 3 mmHg.  Conclusion(s)/Recommendation(s): Murmur is probably related to high cardiac output state (consider anemia, thyrotoxicosis, infection, etc).   EKG:   03/20/21: Sinus rhythm, rate 74 bpm   Recent Labs: 02/17/2021: BUN 13; Creatinine, Ser 1.00; Potassium 3.7; Sodium 140  Recent Lipid Panel    Component Value Date/Time   CHOL 172 04/11/2019 1032   TRIG 59 04/11/2019 1032   HDL 79 04/11/2019 1032   CHOLHDL 2.2 04/11/2019 1032   CHOLHDL 2.2 08/27/2015 1402   VLDL 10 08/27/2015 1402   LDLCALC 81 04/11/2019 1032      Physical Exam:    VS:  BP (!) 144/72   Pulse 72   Ht '5\' 4"'$  (1.626 m)   Wt 133 lb  12.8 oz (60.7 kg)   LMP  (LMP Unknown)   SpO2 99%   BMI 22.97 kg/m     Wt Readings from Last 3 Encounters:  09/26/21 133 lb 12.8 oz (60.7 kg)  07/02/21 135 lb 1.6 oz (61.3 kg)  03/20/21 134 lb (60.8 kg)     GEN: Comfortable, NAD HEENT: Normal NECK: No JVD; No carotid bruits CARDIAC: RRR, 1/6 sytolic murmur, no rubs, gallops RESPIRATORY:  Clear to auscultation without rales, wheezing or rhonchi  ABDOMEN: Soft, non-tender, non-distended MUSCULOSKELETAL:  No edema; No deformity  SKIN: Warm and dry NEUROLOGIC:  Alert and oriented x 3 PSYCHIATRIC:  Normal affect   ASSESSMENT:    1. Essential hypertension   2. Seizure disorder ([place])   3. Hyperlipidemia, unspecified hyperlipidemia type   4. Malignant neoplasm of lower-outer quadrant of left breast of female, estrogen receptor positive ([place])     PLAN:    In order of problems listed above:  #HTN: Well controlled at home and at goal <120s/80 -Continue lisinopril-HCTZ 20-'25mg'$  daily   #HLD: Monitored by PCP. LDL 81 with goal <100.  -Continue simva '20mg'$  daily  #Breast Cancer: Currently in remission. S/p left lumpectomy and 4 weeks of XRT therapy. TTE with strain in 10/2020 with normal EF, normal strain.  -Continue anastrozole '1mg'$  daily -Follow-up with Onc as  scheduled  #Systolic Murmur: Normal valves on TTE. Likely due to high output state with EF 70-75%.  #Seizure Disorder: -Follow-up with Neurology as scheduled     Medication Adjustments/Labs and Tests Ordered: Current medicines are reviewed at length with the patient today.  Concerns regarding medicines are outlined above.  No orders of the defined types were placed in this encounter.   No orders of the defined types were placed in this encounter.   There are no Patient Instructions on file for this visit.     Signed, Freada Bergeron, MD  09/26/2021 10:49 AM    [place]

## 2021-09-26 ENCOUNTER — Encounter: Payer: Self-pay | Admitting: Cardiology

## 2021-09-26 ENCOUNTER — Ambulatory Visit: Payer: Medicare HMO | Admitting: Cardiology

## 2021-09-26 VITALS — BP 144/72 | HR 72 | Ht 64.0 in | Wt 133.8 lb

## 2021-09-26 DIAGNOSIS — I10 Essential (primary) hypertension: Secondary | ICD-10-CM

## 2021-09-26 DIAGNOSIS — G40909 Epilepsy, unspecified, not intractable, without status epilepticus: Secondary | ICD-10-CM

## 2021-09-26 DIAGNOSIS — E785 Hyperlipidemia, unspecified: Secondary | ICD-10-CM | POA: Diagnosis not present

## 2021-09-26 DIAGNOSIS — C50512 Malignant neoplasm of lower-outer quadrant of left female breast: Secondary | ICD-10-CM

## 2021-09-26 DIAGNOSIS — Z170 Estrogen receptor positive status [ER+]: Secondary | ICD-10-CM

## 2021-09-26 DIAGNOSIS — R011 Cardiac murmur, unspecified: Secondary | ICD-10-CM

## 2021-09-26 NOTE — Patient Instructions (Signed)
Medication Instructions:   Your physician recommends that you continue on your current medications as directed. Please refer to the Current Medication list given to you today.  *If you need a refill on your cardiac medications before your next appointment, please call your pharmacy*   Follow-Up: At [facility], you and your health needs are our priority.  As part of our continuing mission to provide you with exceptional heart care, we have created designated Provider Care Teams.  These Care Teams include your primary Cardiologist (physician) and Advanced Practice Providers (APPs -  Physician Assistants and Nurse Practitioners) who all work together to provide you with the care you need, when you need it.  We recommend signing up for the patient portal called "MyChart".  Sign up information is provided on this After Visit Summary.  MyChart is used to connect with patients for Virtual Visits (Telemedicine).  Patients are able to view lab/test results, encounter notes, upcoming appointments, etc.  Non-urgent messages can be sent to your provider as well.   To learn more about what you can do with MyChart, go to [email or web address].    Your next appointment:   6 month(s)  The format for your next appointment:   In Person  Provider:   DR. PEMBERTON  Important Information About Sugar       

## 2021-09-26 NOTE — Progress Notes (Signed)
Cardiology Office Note:    Date:  09/26/2021   ID:  Robin Anderson, [place] Mar 15, 1941, MRN [id number]  PCP:  Arthur Holms, NP   [facility] Providers Cardiologist:  None {   Referring MD: Arthur Holms, NP    History of Present Illness:    Robin Anderson is a 81 y.o. female with a hx of HTN, HLD, breast cancer and seizure disorder who was previously followed by Truitt Merle who now returns to clinic for CV follow-up.  Seen in clinic on 09/26/20 where she was doing well from a CV standpoint. TTE with strain 10/25/20 with LVEF 70-75%, GLS 26.4%, normal diastology, no significant valve disease.  Was last seen in 03/2021 where she was doing very well. BP well controlled.  Today, she feels well from a CV standpoint. No chest pain, SOB, orthopnea or PND. Blood pressure is elevated today but she attributes this to rushing to the appointment. Better controlled at home. Continues to follow with Oncology. Inquiring about a procedure to drain her left breast and remove a mass from the right breast.   Past Medical History:  Diagnosis Date   Blood in stool    Cancer ([place])    breast - left   Cataracts, bilateral    Chest pain, unspecified    Family history of breast cancer    Headache 07/02/2014   Heart murmur    Hepatitis    History of colon polyps    HLD (hyperlipidemia)    HTN (hypertension)    Prediabetes 07/28/2011   no meds, diet controlled   Seizure disorder ([place])    seizures controlled with med   Wears glasses     Past Surgical History:  Procedure Laterality Date   ABDOMINAL HERNIA REPAIR     lower mid line hernia which has reherniatied   BREAST BIOPSY Left 01/31/2019   Malignant   BREAST LUMPECTOMY Left 09/05/2019   re-excision 10/03/19   BREAST LUMPECTOMY WITH RADIOACTIVE SEED AND SENTINEL LYMPH NODE BIOPSY N/A 09/05/2019   Procedure: RIGHT BREAST SEED LUMPECTOMY X 1, LEFT BREAST SEED LUMPECTOMY X 2, LEFT AXILLARY SENTINEL LYMPH NODE BIOPSY;  Surgeon: Erroll Luna, MD;  Location: [place];  Service: General;  Laterality: N/A;   BUNIONECTOMY Right    traumatic injury to her foot that was repaired   RE-EXCISION OF BREAST LUMPECTOMY Left 10/03/2019   Procedure: LEFT BREAST RE-EXCISION OF BREAST LUMPECTOMY;  Surgeon: Erroll Luna, MD;  Location: [place];  Service: General;  Laterality: Left;   TONSILLECTOMY     TUBAL LIGATION      Current Medications: Current Meds  Medication Sig   anastrozole (ARIMIDEX) 1 MG tablet Take 1 tablet (1 mg total) by mouth daily.   aspirin EC 81 MG tablet Take 81 mg by mouth daily.   Cholecalciferol (VITAMIN D3) 50 MCG (2000 UT) TABS Take 2,000 Units by mouth daily.   lisinopril-hydrochlorothiazide (ZESTORETIC) 20-25 MG tablet Take 1 tablet by mouth daily.   potassium chloride SA (KLOR-CON) 20 MEQ tablet Take 1 tablet (20 mEq total) by mouth daily. Take for 3 days only.   primidone (MYSOLINE) 50 MG tablet TAKE 3 TABLETS BY MOUTH AT BEDTIME   simvastatin (ZOCOR) 20 MG tablet TAKE 1 TABLET BY MOUTH EVERYDAY AT BEDTIME     Allergies:   Penicillins   Social History   Socioeconomic History   Marital status: Married    Spouse name: [name]   Number of children: 6   Years of education: 14  Highest education level: Not on file  Occupational History   Occupation: retired  Tobacco Use   Smoking status: Former    Types: Cigarettes   Smokeless tobacco: Never   Tobacco comments:    when she was young for a short period of time  Vaping Use   Vaping Use: Never used  Substance and Sexual Activity   Alcohol use: No    Alcohol/week: 0.0 standard drinks of alcohol   Drug use: No   Sexual activity: Not on file  Other Topics Concern   Not on file  Social History Narrative   Patient lives at home with her husband Robin Anderson.   Retired.   Education high school.   Right handed..      Regular exercise-yes   Caffeine Use-yes   Social Determinants of Health   Financial Resource Strain: Not on file  Food  Insecurity: Not on file  Transportation Needs: No Transportation Needs (02/14/2019)   PRAPARE - Hydrologist (Medical): No    Lack of Transportation (Non-Medical): No  Physical Activity: Not on file  Stress: Not on file  Social Connections: Not on file     Family History: The patient's family history includes Breast cancer in her sister; Heart attack in her daughter and father; Hypertension in her brother, mother, and sister. There is no history of Tremor.  ROS:   Review of Systems  Constitutional:  Negative for chills, diaphoresis, fever and malaise/fatigue.  HENT:  Negative for sore throat.   Eyes:  Negative for blurred vision.  Respiratory:  Negative for cough and shortness of breath.   Cardiovascular:  Negative for chest pain, palpitations, orthopnea, claudication, leg swelling and PND.  Gastrointestinal:  Negative for abdominal pain, blood in stool, constipation, diarrhea and nausea.  Genitourinary:  Negative for dysuria.  Musculoskeletal:  Positive for joint pain and myalgias.  Skin:  Negative for rash.  Neurological:  Negative for dizziness, loss of consciousness and headaches.  Endo/Heme/Allergies:  Does not bruise/bleed easily.  Psychiatric/Behavioral:  The patient does not have insomnia.      EKGs/Labs/Other Studies Reviewed:    The following studies were reviewed today: TTE 2020/11/04: IMPRESSIONS   1. Left ventricular ejection fraction, by estimation, is 70 to 75%. Left  ventricular ejection fraction by 3D volume is 78 %. The left ventricle has  hyperdynamic function. The left ventricle has no regional wall motion  abnormalities. Left ventricular diastolic parameters were normal. The average left ventricular global longitudinal strain is 26.4 %. The global longitudinal strain is normal.   2. Right ventricular systolic function is hyperdynamic. The right  ventricular size is normal. There is normal pulmonary artery systolic  pressure.    3. The mitral valve is normal in structure. No evidence of mitral valve  regurgitation. No evidence of mitral stenosis.   4. The aortic valve is tricuspid. Aortic valve regurgitation is not  visualized. No aortic stenosis is present.   5. The inferior vena cava is normal in size with greater than 50%  respiratory variability, suggesting right atrial pressure of 3 mmHg.  Conclusion(s)/Recommendation(s): Murmur is probably related to high cardiac output state (consider anemia, thyrotoxicosis, infection, etc).   EKG:   No new ECG   Recent Labs: 02/17/2021: BUN 13; Creatinine, Ser 1.00; Potassium 3.7; Sodium 140  Recent Lipid Panel    Component Value Date/Time   CHOL 172 04/11/2019 1032   TRIG 59 04/11/2019 1032   HDL 79 04/11/2019 1032   CHOLHDL  2.2 04/11/2019 1032   CHOLHDL 2.2 08/27/2015 1402   VLDL 10 08/27/2015 1402   LDLCALC 81 04/11/2019 1032     Physical Exam:    VS:  BP (!) 144/72   Pulse 72   Ht '5\' 4"'$  (1.626 m)   Wt 133 lb 12.8 oz (60.7 kg)   LMP  (LMP Unknown)   SpO2 99%   BMI 22.97 kg/m     Wt Readings from Last 3 Encounters:  09/26/21 133 lb 12.8 oz (60.7 kg)  07/02/21 135 lb 1.6 oz (61.3 kg)  03/20/21 134 lb (60.8 kg)    GEN: Comfortable, NAD HEENT: Normal NECK: No JVD; No carotid bruits CARDIAC: RRR, 1/6 sytolic murmur, no rubs, gallops RESPIRATORY:  Clear to auscultation without rales, wheezing or rhonchi  MUSCULOSKELETAL:  No edema; No deformity  SKIN: Warm and dry NEUROLOGIC:  Alert and oriented x 3 PSYCHIATRIC:  Normal affect   ASSESSMENT:    1. Essential hypertension   2. Seizure disorder ([place])   3. Hyperlipidemia, unspecified hyperlipidemia type   4. Malignant neoplasm of lower-outer quadrant of left breast of female, estrogen receptor positive ([place])     PLAN:    In order of problems listed above:  #HTN: Well controlled at home and at goal <130s/80. Will continue to monitoring going forward. -Continue lisinopril-HCTZ 20-'25mg'$  daily    #HLD: Monitored by PCP.  -Continue simva '20mg'$  daily -Lipids per PCP  #Breast Cancer: Currently in remission. S/p left lumpectomy and 4 weeks of XRT therapy. TTE with strain in 10/2020 with normal EF, normal strain.  -Continue anastrozole '1mg'$  daily -Follow-up with Onc as scheduled  #Systolic Murmur: Normal valves on TTE. Likely due to high output state with EF 70-75%.  #Seizure Disorder: -Follow-up with Neurology as scheduled     Medication Adjustments/Labs and Tests Ordered: Current medicines are reviewed at length with the patient today.  Concerns regarding medicines are outlined above.  No orders of the defined types were placed in this encounter.   No orders of the defined types were placed in this encounter.   Patient Instructions  Medication Instructions:   Your physician recommends that you continue on your current medications as directed. Please refer to the Current Medication list given to you today.  *If you need a refill on your cardiac medications before your next appointment, please call your pharmacy*   Follow-Up: At [facility], you and your health needs are our priority.  As part of our continuing mission to provide you with exceptional heart care, we have created designated Provider Care Teams.  These Care Teams include your primary Cardiologist (physician) and Advanced Practice Providers (APPs -  Physician Assistants and Nurse Practitioners) who all work together to provide you with the care you need, when you need it.  We recommend signing up for the patient portal called "MyChart".  Sign up information is provided on this After Visit Summary.  MyChart is used to connect with patients for Virtual Visits (Telemedicine).  Patients are able to view lab/test results, encounter notes, upcoming appointments, etc.  Non-urgent messages can be sent to your provider as well.   To learn more about what you can do with MyChart, go to [email or web address].     Your next appointment:   6 month(s)  The format for your next appointment:   In Person  Provider:   DR. Johney Frame  Important Information About Sugar          Signed, [place], DO 09/26/2021 11:23 AM  [place]  Patient seen and examined and agree with Gerlene Fee, DO as detailed above. Plan edited by me. No changes in medication. Continue to monitor lipids with PCP. Will monitor BP at home and if above goal, she will let us know.

## 2021-10-20 ENCOUNTER — Other Ambulatory Visit: Payer: Self-pay

## 2021-10-20 DIAGNOSIS — Z79899 Other long term (current) drug therapy: Secondary | ICD-10-CM

## 2021-10-20 DIAGNOSIS — I10 Essential (primary) hypertension: Secondary | ICD-10-CM

## 2021-10-20 MED ORDER — LISINOPRIL-HYDROCHLOROTHIAZIDE 20-25 MG PO TABS: 1.0000 | ORAL_TABLET | Freq: Every day | ORAL | 3 refills | Status: DC

## 2021-12-05 ENCOUNTER — Other Ambulatory Visit: Payer: Self-pay

## 2021-12-05 MED ORDER — SIMVASTATIN 20 MG PO TABS: ORAL_TABLET | ORAL | 3 refills | Status: DC

## 2022-01-21 ENCOUNTER — Ambulatory Visit: Payer: Medicare HMO | Admitting: Adult Health

## 2022-01-21 ENCOUNTER — Encounter: Payer: Self-pay | Admitting: Adult Health

## 2022-01-21 VITALS — BP 157/61 | HR 63 | Ht 64.0 in | Wt 136.8 lb

## 2022-01-21 DIAGNOSIS — G40409 Other generalized epilepsy and epileptic syndromes, not intractable, without status epilepticus: Secondary | ICD-10-CM | POA: Diagnosis not present

## 2022-01-21 DIAGNOSIS — R251 Tremor, unspecified: Secondary | ICD-10-CM

## 2022-01-21 NOTE — Progress Notes (Signed)
PATIENT: Robin Anderson DOB: July 27, 1940  REASON FOR VISIT: follow up HISTORY FROM: patient PRIMARY NEUROLOGIST: Dr. Jannifer Franklin transistion to   Chief Complaint  Patient presents with   Follow-up    Rm 19, essential tremors and seizures. Has some worsening of tremor.  Taking primidone 1 tab am 2 tabs pm     HISTORY OF PRESENT ILLNESS: Today 01/21/22:  Robin Anderson is a 81 year old female with a history of tremors and seizures.  She returns today for follow-up.  She remains on primidone for tremor.  Currently taking 50 mg in the morning and 100 mg at bedtime. she feels that the tremor has gotten worse in the right hand.  Notices some tremor with food and handwriting. No trouble with buttons. Lives at home with her husband. No longer drives.  Denies any seizure-like events.  01/20/21: Robin Anderson is a 81 y.o. female with a history of tremors. She is here today for a follow up. Today patient expresses increased tremor in right hand compared to her left. She denies difficulty getting dressed or having gait imbalances and falls. However she does endorse some minor difficulties with feeding and handwriting. She denies excess sleepiness or fatigue. She reports that she has been taking the Primidone 50 MG in the morning, at noon, and in the evening. Since taking the medication this way she expresses that her tremors have not improved and the right hand tremor has been exacerbated. She denies any seizure activity at this time. She has no other complaints at this time.  HISTORY  01/17/20: Robin Anderson is a 81 year old female with a history of seizure disorder and essential tremor. She returns today for follow-up. Denies any seizures. Continues on primidone 50 mg 3 tablets at bedtime. Reports that she notices the tremor with handwriting. Feels that it might be worse. She had breast cancer had surgery in May and again in June. She is having swelling in the left breast. Will follow-up with surgeon on Monday.  She returns today for follow-up.   07/12/19  Robin Anderson is a 81 year old female with history of seizure disorder and tremor in her right hand. She has previously been on Keppra, but stopped due to report of moles on her face and scalp. She remains on primidone for seizure prevention and tremor. Her last seizure occurred in 2018 while taking Keppra 250 mg in the morning, 500 mg in the evening. The seizure episode was described as having woken up in the morning, had bitten her lip. She is taking primidone 150 mg at bedtime. She has breast cancer, is on oral chemo.  She has not had recurrent seizure.  The tremor is improved with primidone in the right hand, it may increase during times of anxiety. When she takes the primidone at night, she may have a chalky taste in her mouth.  She does not drive a car.  She presents today for follow-up unaccompanied.    REVIEW OF SYSTEMS: Out of a complete 14 system review of symptoms, the patient complains only of the following symptoms, and all other reviewed systems are negative. Tremor in right hand.   ALLERGIES: Allergies  Allergen Reactions   Penicillins Rash    HOME MEDICATIONS: Outpatient Medications Prior to Visit  Medication Sig Dispense Refill   anastrozole (ARIMIDEX) 1 MG tablet Take 1 tablet (1 mg total) by mouth daily. 90 tablet 4   aspirin EC 81 MG tablet Take 81 mg by mouth daily.     Cholecalciferol (  VITAMIN D3) 50 MCG (2000 UT) TABS Take 2,000 Units by mouth daily.     lisinopril-hydrochlorothiazide (ZESTORETIC) 20-25 MG tablet Take 1 tablet by mouth daily. 90 tablet 3   potassium chloride SA (KLOR-CON) 20 MEQ tablet Take 1 tablet (20 mEq total) by mouth daily. Take for 3 days only. 3 tablet 0   primidone (MYSOLINE) 50 MG tablet TAKE 3 TABLETS BY MOUTH AT BEDTIME 270 tablet 1   simvastatin (ZOCOR) 20 MG tablet TAKE 1 TABLET BY MOUTH EVERYDAY AT BEDTIME 90 tablet 3   No facility-administered medications prior to visit.    PAST MEDICAL  HISTORY: Past Medical History:  Diagnosis Date   Blood in stool    Cancer ([place])    breast - left   Cataracts, bilateral    Chest pain, unspecified    Family history of breast cancer    Headache 07/02/2014   Heart murmur    Hepatitis    History of colon polyps    HLD (hyperlipidemia)    HTN (hypertension)    Prediabetes 07/28/2011   no meds, diet controlled   Seizure disorder ([place])    seizures controlled with med   Wears glasses     PAST SURGICAL HISTORY: Past Surgical History:  Procedure Laterality Date   ABDOMINAL HERNIA REPAIR     lower mid line hernia which has reherniatied   BREAST BIOPSY Left 01/31/2019   Malignant   BREAST LUMPECTOMY Left 09/05/2019   re-excision 10/03/19   BREAST LUMPECTOMY WITH RADIOACTIVE SEED AND SENTINEL LYMPH NODE BIOPSY N/A 09/05/2019   Procedure: RIGHT BREAST SEED LUMPECTOMY X 1, LEFT BREAST SEED LUMPECTOMY X 2, LEFT AXILLARY SENTINEL LYMPH NODE BIOPSY;  Surgeon: Erroll Luna, MD;  Location: [place];  Service: General;  Laterality: N/A;   BUNIONECTOMY Right    traumatic injury to her foot that was repaired   RE-EXCISION OF BREAST LUMPECTOMY Left 10/03/2019   Procedure: LEFT BREAST RE-EXCISION OF BREAST LUMPECTOMY;  Surgeon: Erroll Luna, MD;  Location[facility];  Service: General;  Laterality: Left;   TONSILLECTOMY     TUBAL LIGATION      FAMILY HISTORY: Family History  Problem Relation Age of Onset   Hypertension Mother    Heart attack Father    Hypertension Sister        5 sisters....3 have HTN   Breast cancer Sister    Hypertension Brother    Heart attack Daughter    Tremor Neg Hx     SOCIAL HISTORY: Social History   Socioeconomic History   Marital status: Married    Spouse name: Charles   Number of children: 6   Years of education: 12   Highest education level: Not on file  Occupational History   Occupation: retired  Tobacco Use   Smoking status: Former    Types: Cigarettes   Smokeless tobacco: Never   Tobacco  comments:    when she was young for a short period of time  Media planner   Vaping Use: Never used  Substance and Sexual Activity   Alcohol use: No    Alcohol/week: 0.0 standard drinks of alcohol   Drug use: No   Sexual activity: Not on file  Other Topics Concern   Not on file  Social History Narrative   Patient lives at home with her husband Calena [place].   Retired.   Education high school.   Right handed..      Regular exercise-yes   Caffeine Use-yes   Social Determinants of  Health   Financial Resource Strain: Not on file  Food Insecurity: Not on file  Transportation Needs: No Transportation Needs (02/14/2019)   PRAPARE - Hydrologist (Medical): No    Lack of Transportation (Non-Medical): No  Physical Activity: Not on file  Stress: Not on file  Social Connections: Not on file  Intimate Partner Violence: Not At Risk (02/14/2019)   Humiliation, Afraid, Rape, and Kick questionnaire    Fear of Current or Ex-Partner: No    Emotionally Abused: No    Physically Abused: No    Sexually Abused: No      PHYSICAL EXAM  Vitals:   01/21/22 1053  BP: (!) 157/61  Pulse: 63  Weight: 136 lb 12.8 oz (62.1 kg)  Height: '5\' 4"'$  (1.626 m)    Body mass index is 23.48 kg/m.  Generalized: Well developed, in no acute distress   Neurological examination  Mentation: Alert oriented to time, place, history taking. Follows all commands speech and language fluent Cranial nerve II-XII: Pupils were equal round reactive to light. Extraocular movements were full, visual field were full on confrontational test. Facial sensation and strength were normal. Uvula tongue midline. Head turning and shoulder shrug  were normal and symmetric. Motor: The motor testing reveals 5 over 5 strength of all 4 extremities. Good symmetric motor tone is noted throughout.  Sensory: Sensory testing is intact to soft touch on all 4 extremities. No evidence of extinction is noted.   Coordination: Cerebellar testing reveals action tremor in the right on finger-nose-finger. Normal heel to shin. Spiral test was positive for essential tremor R > L.  Gait and station: Gait is normal.    DIAGNOSTIC DATA (LABS, IMAGING, TESTING) - I reviewed patient records, labs, notes, testing and imaging myself where available.  Lab Results  Component Value Date   WBC 6.5 10/03/2019   HGB 12.4 10/03/2019   HCT 38.9 10/03/2019   MCV 88.4 10/03/2019   PLT 220 10/03/2019      Component Value Date/Time   NA 140 02/17/2021 0946   K 3.7 02/17/2021 0946   CL 99 02/17/2021 0946   CO2 28 02/17/2021 0946   GLUCOSE 131 (H) 02/17/2021 0946   GLUCOSE 110 (H) 10/03/2019 0612   BUN 13 02/17/2021 0946   CREATININE 1.00 02/17/2021 0946   CREATININE 0.95 (H) 09/11/2015 0944   CALCIUM 9.5 02/17/2021 0946   PROT 7.6 10/03/2019 0612   PROT 7.1 04/11/2019 1032   ALBUMIN 4.1 10/03/2019 0612   ALBUMIN 4.4 04/11/2019 1032   AST 17 10/03/2019 0612   ALT 12 10/03/2019 0612   ALKPHOS 87 10/03/2019 0612   BILITOT 0.7 10/03/2019 0612   BILITOT 0.3 04/11/2019 1032   GFRNONAA 58 (L) 10/17/2019 1221   GFRAA 66 10/17/2019 1221   Lab Results  Component Value Date   CHOL 172 04/11/2019   HDL 79 04/11/2019   LDLCALC 81 04/11/2019   TRIG 59 04/11/2019   CHOLHDL 2.2 04/11/2019   Lab Results  Component Value Date   HGBA1C 5.8 08/10/2012   No results found for: "VITAMINB12" Lab Results  Component Value Date   TSH 1.880 09/01/2017    3  ASSESSMENT AND PLAN 81 y.o. year old female  has a past medical history of Blood in stool, Cancer ([place]), Cataracts, bilateral, Chest pain, unspecified, Family history of breast cancer, Headache (07/02/2014), Heart murmur, Hepatitis, History of colon polyps, HLD (hyperlipidemia), HTN (hypertension), Prediabetes (07/28/2011), Seizure disorder ([place]), and Wears glasses. here with  1: Essential Tremor  -Continue Primidone but will now take 50 mg TID instead of 1  tablet in the AM and 2 tablet at bedtime - blood work today -Let us know if your tremors do not improve or if they worsen.   2: Seizures - No seizures at this time, continue to monitor for seizure activity.  FU in 6 months or sooner if needed  Ward Givens, MSN, NP-C 01/21/2022, 10:53 AM [facility] [address], [place], [place] [address] [phone]

## 2022-01-22 ENCOUNTER — Telehealth: Payer: Self-pay | Admitting: *Deleted

## 2022-01-22 LAB — CBC WITH DIFFERENTIAL/PLATELET
Basophils Absolute: 0 10*3/uL (ref 0.0–0.2)
Basos: 1 %
EOS (ABSOLUTE): 0.1 10*3/uL (ref 0.0–0.4)
Eos: 3 %
Hematocrit: 32.1 % — ABNORMAL LOW (ref 34.0–46.6)
Hemoglobin: 10.7 g/dL — ABNORMAL LOW (ref 11.1–15.9)
Immature Grans (Abs): 0 10*3/uL (ref 0.0–0.1)
Immature Granulocytes: 0 %
Lymphocytes Absolute: 0.5 10*3/uL — ABNORMAL LOW (ref 0.7–3.1)
Lymphs: 11 %
MCH: 28.3 pg (ref 26.6–33.0)
MCHC: 33.3 g/dL (ref 31.5–35.7)
MCV: 85 fL (ref 79–97)
Monocytes Absolute: 0.4 10*3/uL (ref 0.1–0.9)
Monocytes: 8 %
Neutrophils Absolute: 3.5 10*3/uL (ref 1.4–7.0)
Neutrophils: 77 %
Platelets: 155 10*3/uL (ref 150–450)
RBC: 3.78 x10E6/uL (ref 3.77–5.28)
RDW: 12.9 % (ref 11.7–15.4)
WBC: 4.5 10*3/uL (ref 3.4–10.8)

## 2022-01-22 LAB — PRIMIDONE, SERUM
Phenobarbital, Serum: 8 ug/mL — ABNORMAL LOW (ref 15–40)
Primidone Lvl: 6.1 ug/mL (ref 5.0–12.0)

## 2022-01-22 LAB — COMPREHENSIVE METABOLIC PANEL
ALT: 9 IU/L (ref 0–32)
AST: 19 IU/L (ref 0–40)
Albumin/Globulin Ratio: 1.6 (ref 1.2–2.2)
Albumin: 4.2 g/dL (ref 3.8–4.8)
Alkaline Phosphatase: 110 IU/L (ref 44–121)
BUN/Creatinine Ratio: 9 — ABNORMAL LOW (ref 12–28)
BUN: 9 mg/dL (ref 8–27)
Bilirubin Total: 0.2 mg/dL (ref 0.0–1.2)
CO2: 26 mmol/L (ref 20–29)
Calcium: 9.3 mg/dL (ref 8.7–10.3)
Chloride: 98 mmol/L (ref 96–106)
Creatinine, Ser: 1.03 mg/dL — ABNORMAL HIGH (ref 0.57–1.00)
Globulin, Total: 2.7 g/dL (ref 1.5–4.5)
Glucose: 110 mg/dL — ABNORMAL HIGH (ref 70–99)
Potassium: 3.3 mmol/L — ABNORMAL LOW (ref 3.5–5.2)
Sodium: 138 mmol/L (ref 134–144)
Total Protein: 6.9 g/dL (ref 6.0–8.5)
eGFR: 55 mL/min/{1.73_m2} — ABNORMAL LOW (ref >59)

## 2022-01-22 NOTE — Telephone Encounter (Signed)
Spoke with patient gave blood work results . Pt expressed understanding forward results to PCP this evening

## 2022-01-22 NOTE — Telephone Encounter (Signed)
-----   Message from Ward Givens, NP sent at 01/22/2022  4:25 PM EDT ----- Blood work is consistent with previous blood work.  Hemoglobin and hematocrit slightly low.  Please send blood work to PCP and make patient aware

## 2022-03-08 ENCOUNTER — Other Ambulatory Visit: Payer: Self-pay | Admitting: Adult Health

## 2022-04-08 NOTE — Progress Notes (Unsigned)
Cardiology Office Note:    Date:  04/13/2022   ID:  Robin Anderson, [place] 07-08-1940, MRN [id number]  PCP:  Robin Holms, NP   Robin Anderson Cardiologist:  None {   Referring MD: Robin Holms, NP    History of Present Illness:    Robin Anderson is a 81 y.o. female with a hx of HTN, HLD, breast cancer and seizure disorder who was previously followed by Robin Anderson who now returns to clinic for CV follow-up.  Seen in clinic on 09/26/20 where she was doing well from a CV standpoint. TTE with strain 10/25/20 with LVEF 70-75%, GLS 26.4%, normal diastology, no significant valve disease.  Was last seen in 09/2021 where she was doing well from a CV standpoint.  Today, ***  Past Medical History:  Diagnosis Date   Blood in stool    Cancer ([place])    breast - left   Cataracts, bilateral    Chest pain, unspecified    Family history of breast cancer    Headache 07/02/2014   Heart murmur    Hepatitis    History of colon polyps    HLD (hyperlipidemia)    HTN (hypertension)    Prediabetes 07/28/2011   no meds, diet controlled   Seizure disorder ([place])    seizures controlled with med   Wears glasses     Past Surgical History:  Procedure Laterality Date   ABDOMINAL HERNIA REPAIR     lower mid line hernia which has reherniatied   BREAST BIOPSY Left 01/31/2019   Malignant   BREAST LUMPECTOMY Left 09/05/2019   re-excision 10/03/19   BREAST LUMPECTOMY WITH RADIOACTIVE SEED AND SENTINEL LYMPH NODE BIOPSY N/A 09/05/2019   Procedure: RIGHT BREAST SEED LUMPECTOMY X 1, LEFT BREAST SEED LUMPECTOMY X 2, LEFT AXILLARY SENTINEL LYMPH NODE BIOPSY;  Surgeon: Robin Luna, MD;  Location: [facility];  Service: General;  Laterality: N/A;   BUNIONECTOMY Right    traumatic injury to her foot that was repaired   RE-EXCISION OF BREAST LUMPECTOMY Left 10/03/2019   Procedure: LEFT BREAST RE-EXCISION OF BREAST LUMPECTOMY;  Surgeon: Robin Luna, MD;  Location: [place] [facility];  Service: General;   Laterality: Left;   TONSILLECTOMY     TUBAL LIGATION      Current Medications: No outpatient medications have been marked as taking for the 04/15/22 encounter (Appointment) with Robin Bergeron, MD.     Allergies:   Penicillins   Social History   Socioeconomic History   Marital status: Married    Spouse name: Robin Anderson   Number of children: 6   Years of education: 12   Highest education level: Not on file  Occupational History   Occupation: retired  Tobacco Use   Smoking status: Former    Types: Cigarettes   Smokeless tobacco: Never   Tobacco comments:    when she was young for a short period of time  Media planner   Vaping Use: Never used  Substance and Sexual Activity   Alcohol use: No    Alcohol/week: 0.0 standard drinks of alcohol   Drug use: No   Sexual activity: Not on file  Other Topics Concern   Not on file  Social History Narrative   Patient lives at home with her husband Robin Anderson.   Retired.   Education high school.   Right handed..      Regular exercise-yes   Caffeine Use-yes   Social Determinants of Health   Financial Resource Strain: Not on file  Food Insecurity: Not on file  Transportation Needs: No Transportation Needs (02/14/2019)   PRAPARE - Hydrologist (Medical): No    Lack of Transportation (Non-Medical): No  Physical Activity: Not on file  Stress: Not on file  Social Connections: Not on file     Family History: The patient's family history includes Breast cancer in her sister; Heart attack in her daughter and father; Hypertension in her brother, mother, and sister. There is no history of Tremor.  ROS:   Review of Systems  Constitutional:  Negative for chills, diaphoresis, fever and malaise/fatigue.  HENT:  Negative for sore throat.   Eyes:  Negative for blurred vision.  Respiratory:  Negative for cough and shortness of breath.   Cardiovascular:  Negative for chest pain, palpitations, orthopnea,  claudication, leg swelling and PND.  Gastrointestinal:  Negative for abdominal pain, blood in stool, constipation, diarrhea and nausea.  Genitourinary:  Negative for dysuria.  Musculoskeletal:  Positive for joint pain and myalgias.  Skin:  Negative for rash.  Neurological:  Negative for dizziness, loss of consciousness and headaches.  Endo/Heme/Allergies:  Does not bruise/bleed easily.  Psychiatric/Behavioral:  The patient does not have insomnia.      EKGs/Labs/Other Studies Reviewed:    The following studies were reviewed today: TTE 2020/11/06: IMPRESSIONS   1. Left ventricular ejection fraction, by estimation, is 70 to 75%. Left  ventricular ejection fraction by 3D volume is 78 %. The left ventricle has  hyperdynamic function. The left ventricle has no regional wall motion  abnormalities. Left ventricular diastolic parameters were normal. The average left ventricular global longitudinal strain is 26.4 %. The global longitudinal strain is normal.   2. Right ventricular systolic function is hyperdynamic. The right  ventricular size is normal. There is normal pulmonary artery systolic  pressure.   3. The mitral valve is normal in structure. No evidence of mitral valve  regurgitation. No evidence of mitral stenosis.   4. The aortic valve is tricuspid. Aortic valve regurgitation is not  visualized. No aortic stenosis is present.   5. The inferior vena cava is normal in size with greater than 50%  respiratory variability, suggesting right atrial pressure of 3 mmHg.  Conclusion(s)/Recommendation(s): Murmur is probably related to high cardiac output state (consider anemia, thyrotoxicosis, infection, etc).   EKG:   No new ECG   Recent Labs: 01/21/2022: ALT 9; BUN 9; Creatinine, Ser 1.03; Hemoglobin 10.7; Platelets 155; Potassium 3.3; Sodium 138  Recent Lipid Panel    Component Value Date/Time   CHOL 172 04/11/2019 1032   TRIG 59 04/11/2019 1032   HDL 79 04/11/2019 1032   CHOLHDL 2.2  04/11/2019 1032   CHOLHDL 2.2 08/27/2015 1402   VLDL 10 08/27/2015 1402   LDLCALC 81 04/11/2019 1032     Physical Exam:    VS:  LMP  (LMP Unknown)     Wt Readings from Last 3 Encounters:  01/21/22 136 lb 12.8 oz (62.1 kg)  09/26/21 133 lb 12.8 oz (60.7 kg)  07/02/21 135 lb 1.6 oz (61.3 kg)    GEN: Comfortable, NAD HEENT: Normal NECK: No JVD; No carotid bruits CARDIAC: RRR, 1/6 sytolic murmur, no rubs, gallops RESPIRATORY:  Clear to auscultation without rales, wheezing or rhonchi  MUSCULOSKELETAL:  No edema; No deformity  SKIN: Warm and dry NEUROLOGIC:  Alert and oriented x 3 PSYCHIATRIC:  Normal affect   ASSESSMENT:    No diagnosis found.   PLAN:    In order of problems  listed above:  #HTN: Well controlled at home and at goal <130s/80. Will continue to monitoring going forward. -Continue lisinopril-HCTZ 20-'25mg'$  daily   #HLD: Monitored by PCP.  -Continue simva '20mg'$  daily -Lipids per PCP  #Breast Cancer: Currently in remission. S/p left lumpectomy and 4 weeks of XRT therapy. TTE with strain in 10/2020 with normal EF, normal strain.  -Continue anastrozole '1mg'$  daily -Follow-up with Onc as scheduled  #Systolic Murmur: Normal valves on TTE. Likely due to high output state with EF 70-75%.  #Seizure Disorder: -Follow-up with Neurology as scheduled     Medication Adjustments/Labs and Tests Ordered: Current medicines are reviewed at length with the patient today.  Concerns regarding medicines are outlined above.  No orders of the defined types were placed in this encounter.   No orders of the defined types were placed in this encounter.   There are no Patient Instructions on file for this visit.     Signed, Gwyndolyn Kaufman, MD 04/13/2022 7:38 PM    [place]

## 2022-04-15 ENCOUNTER — Encounter: Payer: Self-pay | Admitting: Cardiology

## 2022-04-15 ENCOUNTER — Ambulatory Visit: Payer: Medicare HMO | Attending: Cardiology | Admitting: Cardiology

## 2022-04-15 VITALS — BP 138/60 | HR 71 | Ht 64.0 in | Wt 131.8 lb

## 2022-04-15 DIAGNOSIS — Z170 Estrogen receptor positive status [ER+]: Secondary | ICD-10-CM

## 2022-04-15 DIAGNOSIS — R011 Cardiac murmur, unspecified: Secondary | ICD-10-CM

## 2022-04-15 DIAGNOSIS — E785 Hyperlipidemia, unspecified: Secondary | ICD-10-CM

## 2022-04-15 DIAGNOSIS — C50512 Malignant neoplasm of lower-outer quadrant of left female breast: Secondary | ICD-10-CM

## 2022-04-15 DIAGNOSIS — R2241 Localized swelling, mass and lump, right lower limb: Secondary | ICD-10-CM

## 2022-04-15 DIAGNOSIS — I10 Essential (primary) hypertension: Secondary | ICD-10-CM

## 2022-04-15 DIAGNOSIS — G40909 Epilepsy, unspecified, not intractable, without status epilepticus: Secondary | ICD-10-CM

## 2022-04-15 NOTE — Progress Notes (Signed)
Cardiology Office Note:    Date:  04/15/2022   ID:  Robin Anderson, [place] [birth year], MRN [id number]  PCP:  Arthur Holms, NP   [facility] Providers Cardiologist:  Freada Bergeron, MD {   Referring MD: Arthur Holms, NP    History of Present Illness:    Robin Anderson is a 81 y.o. female with a hx of HTN, HLD, breast cancer and seizure disorder who was previously followed by Truitt Merle who now returns to clinic for CV follow-up.  Seen in clinic on 09/26/20 where she was doing well from a CV standpoint. TTE with strain 10/25/20 with LVEF 70-75%, GLS 26.4%, normal diastology, no significant valve disease.  Was last seen in 09/2021 where she was doing well from a CV standpoint.  Today, she says she is feeling pretty good. She has been having RLE ankle edema for the past two months which is new. No pain in the leg and no breathing issues. No recent injury. Symptoms improve somewhat with elevation but do not completely resolve.  Additionally, she mentions that she will sometimes feel some pain down her left arm which will spasm her hand. She is planning to discuss further with her Neurologist.  Otherwise, she is doing well from a CV standpoint. She denies any palpitations, chest pain, or shortness of breath. No lightheadedness, headaches, syncope, orthopnea, or PND.  Past Medical History:  Diagnosis Date   Blood in stool    Cancer ([place])    breast - left   Cataracts, bilateral    Chest pain, unspecified    Family history of breast cancer    Headache 07/02/2014   Heart murmur    Hepatitis    History of colon polyps    HLD (hyperlipidemia)    HTN (hypertension)    Prediabetes 07/28/2011   no meds, diet controlled   Seizure disorder ([place])    seizures controlled with med   Wears glasses     Past Surgical History:  Procedure Laterality Date   ABDOMINAL HERNIA REPAIR     lower mid line hernia which has reherniatied   BREAST BIOPSY Left 01/31/2019   Malignant    BREAST LUMPECTOMY Left 09/05/2019   re-excision 10/03/19   BREAST LUMPECTOMY WITH RADIOACTIVE SEED AND SENTINEL LYMPH NODE BIOPSY N/A 09/05/2019   Procedure: RIGHT BREAST SEED LUMPECTOMY X 1, LEFT BREAST SEED LUMPECTOMY X 2, LEFT AXILLARY SENTINEL LYMPH NODE BIOPSY;  Surgeon: Erroll Luna, MD;  Location: [facility];  Service: General;  Laterality: N/A;   BUNIONECTOMY Right    traumatic injury to her foot that was repaired   RE-EXCISION OF BREAST LUMPECTOMY Left 10/03/2019   Procedure: LEFT BREAST RE-EXCISION OF BREAST LUMPECTOMY;  Surgeon: Erroll Luna, MD;  Location: [facility];  Service: General;  Laterality: Left;   TONSILLECTOMY     TUBAL LIGATION      Current Medications: Current Meds  Medication Sig   anastrozole (ARIMIDEX) 1 MG tablet Take 1 tablet (1 mg total) by mouth daily.   aspirin EC 81 MG tablet Take 81 mg by mouth daily.   lisinopril-hydrochlorothiazide (ZESTORETIC) 20-25 MG tablet Take 1 tablet by mouth daily.   MIEBO 1.338 GM/ML SOLN Apply 1 drop to eye daily at 6 (six) AM.   Multiple Vitamin (MULTIVITAMIN) tablet Take 1 tablet by mouth daily.   primidone (MYSOLINE) 50 MG tablet Take 1 tablet (50 mg total) by mouth 3 (three) times daily.   simvastatin (ZOCOR) 20 MG tablet TAKE 1 TABLET BY MOUTH  EVERYDAY AT BEDTIME     Allergies:   Penicillins   Social History   Socioeconomic History   Marital status: Married    Spouse name: Robin Anderson   Number of children: 6   Years of education: 12   Highest education level: Not on file  Occupational History   Occupation: retired  Tobacco Use   Smoking status: Former    Types: Cigarettes   Smokeless tobacco: Never   Tobacco comments:    when she was young for a short period of time  Media planner   Vaping Use: Never used  Substance and Sexual Activity   Alcohol use: No    Alcohol/week: 0.0 standard drinks of alcohol   Drug use: No   Sexual activity: Not on file  Other Topics Concern   Not on file  Social History Narrative    Patient lives at home with her husband Rayanne Padmanabhan.   Retired.   Education high school.   Right handed..      Regular exercise-yes   Caffeine Use-yes   Social Determinants of Health   Financial Resource Strain: Not on file  Food Insecurity: Not on file  Transportation Needs: No Transportation Needs (02/14/2019)   PRAPARE - Hydrologist (Medical): No    Lack of Transportation (Non-Medical): No  Physical Activity: Not on file  Stress: Not on file  Social Connections: Not on file     Family History: The patient's family history includes Breast cancer in her sister; Heart attack in her daughter and father; Hypertension in her brother, mother, and sister. There is no history of Tremor.  ROS:   Review of Systems  Constitutional:  Negative for chills, diaphoresis, fever and malaise/fatigue.  HENT:  Negative for sore throat.   Eyes:  Negative for blurred vision.  Respiratory:  Negative for cough and shortness of breath.   Cardiovascular:  Positive for leg swelling. Negative for chest pain, palpitations, orthopnea, claudication and PND.  Gastrointestinal:  Negative for abdominal pain, blood in stool, constipation, diarrhea and nausea.  Genitourinary:  Negative for dysuria.  Musculoskeletal:  Positive for joint pain and myalgias.  Skin:  Negative for rash.  Neurological:  Negative for dizziness, loss of consciousness and headaches.  Endo/Heme/Allergies:  Does not bruise/bleed easily.  Psychiatric/Behavioral:  The patient does not have insomnia.     EKGs/Labs/Other Studies Reviewed:    The following studies were reviewed today:  TTE 10/25/20: IMPRESSIONS   1. Left ventricular ejection fraction, by estimation, is 70 to 75%. Left  ventricular ejection fraction by 3D volume is 78 %. The left ventricle has  hyperdynamic function. The left ventricle has no regional wall motion  abnormalities. Left ventricular diastolic parameters were normal. The  average left ventricular global longitudinal strain is 26.4 %. The global longitudinal strain is normal.   2. Right ventricular systolic function is hyperdynamic. The right  ventricular size is normal. There is normal pulmonary artery systolic  pressure.   3. The mitral valve is normal in structure. No evidence of mitral valve  regurgitation. No evidence of mitral stenosis.   4. The aortic valve is tricuspid. Aortic valve regurgitation is not  visualized. No aortic stenosis is present.   5. The inferior vena cava is normal in size with greater than 50%  respiratory variability, suggesting right atrial pressure of 3 mmHg.  Conclusion(s)/Recommendation(s): Murmur is probably related to high cardiac output state (consider anemia, thyrotoxicosis, infection, etc).   EKG:  EKG is personally  reviewed. 04/15/22: Sinus rhythm. Rate 71 bpm.   Recent Labs: 01/21/2022: ALT 9; BUN 9; Creatinine, Ser 1.03; Hemoglobin 10.7; Platelets 155; Potassium 3.3; Sodium 138   Recent Lipid Panel    Component Value Date/Time   CHOL 172 04/11/2019 1032   TRIG 59 04/11/2019 1032   HDL 79 04/11/2019 1032   CHOLHDL 2.2 04/11/2019 1032   CHOLHDL 2.2 08/27/2015 1402   VLDL 10 08/27/2015 1402   LDLCALC 81 04/11/2019 1032     Physical Exam:    VS:  BP 138/60   Pulse 71   Ht '5\' 4"'$  (1.626 m)   Wt 131 lb 12.8 oz (59.8 kg)   LMP  (LMP Unknown)   SpO2 98%   BMI 22.62 kg/m     Wt Readings from Last 3 Encounters:  04/15/22 131 lb 12.8 oz (59.8 kg)  01/21/22 136 lb 12.8 oz (62.1 kg)  09/26/21 133 lb 12.8 oz (60.7 kg)    GEN: Comfortable, NAD HEENT: Normal NECK: No JVD; No carotid bruits CARDIAC: RRR, 2/6 sytolic murmur. No rubs or gallops RESPIRATORY:  Clear to auscultation without rales, wheezing or rhonchi  MUSCULOSKELETAL: 2+ right ankle edema, warm SKIN: Warm and dry NEUROLOGIC:  Alert and oriented x 3 PSYCHIATRIC:  Normal affect   ASSESSMENT:    1. Localized swelling of right lower leg   2.  Essential hypertension   3. Hyperlipidemia, unspecified hyperlipidemia type   4. Systolic murmur   5. Seizure disorder ([place])   6. Malignant neoplasm of lower-outer quadrant of left breast of female, estrogen receptor positive ([place])     PLAN:    In order of problems listed above:  #Asymmetric RLE edema: Has right ankle to lower shin edema that the patient reports is new over the past 2 months. No recent injury or period of prolonged sitting. No associated SOB. Given the degree of asymmetry without known trigger, will check LE doppler to ensure no evidence of DVT. -Check RLE doppler  #HTN: Well controlled at home and at goal <130s/80. Will continue to monitoring going forward. -Continue lisinopril-HCTZ 20-'25mg'$  daily   #HLD: Monitored by PCP.  -Continue simva '20mg'$  daily -Lipids per PCP  #Breast Cancer: Currently in remission. S/p left lumpectomy and 4 weeks of XRT therapy. TTE with strain in 10/2020 with normal EF, normal strain.  -Continue anastrozole '1mg'$  daily -Follow-up with Onc as scheduled  #Systolic Murmur: Normal valves on TTE. Likely due to high output state with EF 70-75%.  #Seizure Disorder: #Left arm neuropathy: -Follow-up with Neurology as scheduled   Follow up: 6 months.   Medication Adjustments/Labs and Tests Ordered: Current medicines are reviewed at length with the patient today.  Concerns regarding medicines are outlined above.  Orders Placed This Encounter  Procedures   EKG 12-Lead   VAS [place] LOWER EXTREMITY VENOUS (DVT)    No orders of the defined types were placed in this encounter.   Patient Instructions  Medication Instructions:  Your physician recommends that you continue on your current medications as directed. Please refer to the Current Medication list given to you today.  *If you need a refill on your cardiac medications before your next appointment, please call your pharmacy*  Lab Work: If you have labs (blood work) drawn today and your  tests are completely normal, you will receive your results only by: [place] (if you have MyChart) OR A paper copy in the mail If you have any lab test that is abnormal or we need to change your  treatment, we will call you to review the results.  Testing/Procedures: Your physician has requested that you have a lower extremity venous duplex (Friday). This test is an ultrasound of the veins in the legs. It looks at venous blood flow that carries blood from the heart to the legs. Allow one hour for a Lower Venous exam. There are no restrictions or special instructions.  Follow-Up: At [facility], you and your health needs are our priority.  As part of our continuing mission to provide you with exceptional heart care, we have created designated Provider Care Teams.  These Care Teams include your primary Cardiologist (physician) and Advanced Practice Providers (APPs -  Physician Assistants and Nurse Practitioners) who all work together to provide you with the care you need, when you need it.  We recommend signing up for the patient portal called "MyChart".  Sign up information is provided on this After Visit Summary.  MyChart is used to connect with patients for Virtual Visits (Telemedicine).  Patients are able to view lab/test results, encounter notes, upcoming appointments, etc.  Non-urgent messages can be sent to your provider as well.   To learn more about what you can do with MyChart, go to [email or web address].    Your next appointment:   6 month(s)  The format for your next appointment:   In Person  Provider:   Freada Bergeron, MD      Important Information About Sugar          I,Breanna Adamick,acting as a scribe for Freada Bergeron, MD.,have documented all relevant documentation on the behalf of Freada Bergeron, MD,as directed by  Freada Bergeron, MD while in the presence of Freada Bergeron, MD.  I, Freada Bergeron, MD, have reviewed  all documentation for this visit. The documentation on 04/15/22 for the exam, diagnosis, procedures, and orders are all accurate and complete.   Signed, Gwyndolyn Kaufman, MD 04/15/2022 10:13 AM    [place]

## 2022-04-15 NOTE — Patient Instructions (Addendum)
Medication Instructions:  Your physician recommends that you continue on your current medications as directed. Please refer to the Current Medication list given to you today.  *If you need a refill on your cardiac medications before your next appointment, please call your pharmacy*  Lab Work: If you have labs (blood work) drawn today and your tests are completely normal, you will receive your results only by: [place] (if you have MyChart) OR A paper copy in the mail If you have any lab test that is abnormal or we need to change your treatment, we will call you to review the results.  Testing/Procedures: Your physician has requested that you have a lower extremity venous duplex (Friday). This test is an ultrasound of the veins in the legs. It looks at venous blood flow that carries blood from the heart to the legs. Allow one hour for a Lower Venous exam. There are no restrictions or special instructions.  Follow-Up: At [facility], you and your health needs are our priority.  As part of our continuing mission to provide you with exceptional heart care, we have created designated Provider Care Teams.  These Care Teams include your primary Cardiologist (physician) and Advanced Practice Providers (APPs -  Physician Assistants and Nurse Practitioners) who all work together to provide you with the care you need, when you need it.  We recommend signing up for the patient portal called "MyChart".  Sign up information is provided on this After Visit Summary.  MyChart is used to connect with patients for Virtual Visits (Telemedicine).  Patients are able to view lab/test results, encounter notes, upcoming appointments, etc.  Non-urgent messages can be sent to your provider as well.   To learn more about what you can do with MyChart, go to [email or web address].    Your next appointment:   6 month(s)  The format for your next appointment:   In Person  Provider:   Freada Bergeron, MD      Important Information About Sugar

## 2022-04-20 HISTORY — PX: BREAST LUMPECTOMY: SHX2

## 2022-04-23 ENCOUNTER — Ambulatory Visit (HOSPITAL_COMMUNITY)
Admission: RE | Admit: 2022-04-23 | Discharge: 2022-04-23 | Disposition: A | Discharge status: Home / Self Care | Payer: Medicare HMO | Source: Ambulatory Visit | Attending: Cardiology | Admitting: Cardiology

## 2022-04-23 DIAGNOSIS — R2241 Localized swelling, mass and lump, right lower limb: Secondary | ICD-10-CM | POA: Insufficient documentation

## 2022-06-03 ENCOUNTER — Other Ambulatory Visit: Payer: Self-pay | Admitting: Surgery

## 2022-06-03 DIAGNOSIS — Z853 Personal history of malignant neoplasm of breast: Secondary | ICD-10-CM

## 2022-06-30 NOTE — Progress Notes (Signed)
Patient Care Team: Arthur Holms, NP as PCP - General (Nurse Practitioner) Freada Bergeron, MD as PCP - Cardiology (Cardiology) Mauro Kaufmann, RN as Oncology Nurse Navigator Robin Germany, RN as Oncology Nurse Navigator Robin Gibson, MD as Attending Physician (Radiation Oncology) Erroll Luna, MD as Consulting Physician (General Surgery) Nicholas Lose, MD as Consulting Physician (Hematology and Oncology)  DIAGNOSIS:  Encounter Diagnoses  Name Primary?   Malignant neoplasm of lower-outer quadrant of left breast of female, estrogen receptor positive (Robin Anderson) Yes   Malignant neoplasm of central portion of right breast in female, estrogen receptor positive (Robin Anderson)     SUMMARY OF ONCOLOGIC HISTORY: Oncology History  Malignant neoplasm of lower-outer quadrant of left breast of female, estrogen receptor positive (Robin Anderson)  01/31/2019 Initial Diagnosis   Palpable lump in the left breast x4 months mammogram 01/27/2019: 2.5 cm mass at 6 o'clock position, 0.7 cm mass retroareolar, axilla negative: Grade 2 IDC ER 100%, PR 100%, HER-2 negative, Ki-67 15% T2N0 stage IB   02/17/2019 Cancer Staging   Staging form: Breast, AJCC 8th Edition - Clinical stage from 02/17/2019: Stage IB (cT2, cN0, cM0, G2, ER+, PR+, HER2-)   01/2019 -  Anti-estrogen oral therapy   Anastrozole   09/05/2019 Surgery   Bilateral lumpectomies ([name]) [id number]):   Left: IDC, grade 2, 3.0cm, with intermediate grade DCIS, 2 left axillary lymph nodes negative.  Margins positive. Right: No residual carcinoma.  10/03/2019: Resection of margins ([name]) [name]): negative for malignancy.   09/05/2019 Cancer Staging   Staging form: Breast, AJCC 8th Edition - Pathologic stage from 09/05/2019: No Stage Recommended (ypT2, pN0, cM0, G2, ER+, PR+, HER2-)   11/06/2019 - 12/01/2019 Radiation Therapy   The patient initially received a dose of 40.05 Gy in 15 fractions to the RIGHT breast and 40.05 Gy in 15 fractions  to the LEFT breast using whole-breast tangent fields. This was delivered using a 3-D conformal technique. The pt received a boost to the LEFT breast delivering an additional 10 Gy in 5 fractions using a electron boost with 84meV electrons. The total dose was 50.05 Gy.    Malignant neoplasm of central portion of right breast in female, estrogen receptor positive (Robin Anderson)  09/05/2018 Cancer Staging   Staging form: Breast, AJCC 8th Edition - Pathologic stage from 09/05/2018: No Stage Recommended (ypT0, pN0, cM0)   01/2019 -  Anti-estrogen oral therapy   Anastrozole   03/10/2019 Cancer Staging   Staging form: Breast, AJCC 8th Edition - Clinical stage from 03/10/2019: Stage IA (cT26mi, cN0, cM0, G1, ER+, PR+, HER2-)   09/05/2019 Surgery   Bilateral lumpectomies ([name]) [id number]):   Left: IDC, grade 2, 3.0cm, with intermediate grade DCIS, 2 left axillary lymph nodes negative.  Margins positive. Right: No residual carcinoma.  10/03/2019: Resection of margins ([name]) [name]): negative for malignancy.   09/20/2019 Initial Diagnosis   Malignant neoplasm of central portion of right breast in female, estrogen receptor positive (Robin Anderson)   11/06/2019 - 12/01/2019 Radiation Therapy   The patient initially received a dose of 40.05 Gy in 15 fractions to the RIGHT breast and 40.05 Gy in 15 fractions to the LEFT breast using whole-breast tangent fields. This was delivered using a 3-D conformal technique. The pt received a boost to the LEFT breast delivering an additional 10 Gy in 5 fractions using a electron boost with 52meV electrons. The total dose was 50.05 Gy.      CHIEF COMPLIANT: Follow-up of left breast cancer on anastrozole    INTERVAL HISTORY: Robin Lever  Hopkins Anderson is a 82 y.o. with above-mentioned history of left breast cancer who was on neoadjuvant antiestrogen therapy with anastrozole. She presents to the clinic for a follow-up.  She continues to have firm left breast related to  radiation scar tissue.  Small palpable nodule in the medial aspect of the right breast.  She is otherwise tolerating anastrozole extremely well.   ALLERGIES:  is allergic to penicillins.  MEDICATIONS:  Current Outpatient Medications  Medication Sig Dispense Refill   anastrozole (ARIMIDEX) 1 MG tablet Take 1 tablet (1 mg total) by mouth daily. 90 tablet 4   aspirin EC 81 MG tablet Take 81 mg by mouth daily.     lisinopril-hydrochlorothiazide (ZESTORETIC) 20-25 MG tablet Take 1 tablet by mouth daily. 90 tablet 3   MIEBO 1.338 GM/ML SOLN Apply 1 drop to eye daily at 6 (six) AM.     Multiple Vitamin (MULTIVITAMIN) tablet Take 1 tablet by mouth daily.     primidone (MYSOLINE) 50 MG tablet Take 1 tablet (50 mg total) by mouth 3 (three) times daily. 270 tablet 1   simvastatin (ZOCOR) 20 MG tablet TAKE 1 TABLET BY MOUTH EVERYDAY AT BEDTIME 90 tablet 3   No current facility-administered medications for this visit.    PHYSICAL EXAMINATION: ECOG PERFORMANCE STATUS: 1 - Symptomatic but completely ambulatory  Vitals:   07/06/22 1008  BP: (!) 146/64  Pulse: 72  Resp: 15  Temp: 97.7 F (36.5 C)  SpO2: 100%   Filed Weights   07/06/22 1008  Weight: 131 lb 8 oz (59.6 kg)      LABORATORY DATA:  I have reviewed the data as listed    Latest Ref Rng & Units 01/21/2022   11:15 AM 02/17/2021    9:46 AM 02/04/2021   10:38 AM  CMP  Glucose 70 - 99 mg/dL 110  131  169   BUN 8 - 27 mg/dL 9  13  14    Creatinine 0.57 - 1.00 mg/dL 1.03  1.00  1.06   Sodium 134 - 144 mmol/L 138  140  136   Potassium 3.5 - 5.2 mmol/L 3.3  3.7  3.3   Chloride 96 - 106 mmol/L 98  99  96   CO2 20 - 29 mmol/L 26  28  26    Calcium 8.7 - 10.3 mg/dL 9.3  9.5  9.3   Total Protein 6.0 - 8.5 g/dL 6.9     Total Bilirubin 0.0 - 1.2 mg/dL 0.2     Alkaline Phos 44 - 121 IU/L 110     AST 0 - 40 IU/L 19     ALT 0 - 32 IU/L 9       Lab Results  Component Value Date   WBC 4.5 01/21/2022   HGB 10.7 (L) 01/21/2022   HCT  32.1 (L) 01/21/2022   MCV 85 01/21/2022   PLT 155 01/21/2022   NEUTROABS 3.5 01/21/2022    ASSESSMENT & PLAN:  Malignant neoplasm of lower-outer quadrant of left breast of female, estrogen receptor positive ([name]) 01/31/2019:Palpable lump in the left breast x4 months mammogram 01/27/2019: 2.5 cm mass at 6 o'clock position, 0.7 cm mass retroareolar, axilla negative: Grade 2 IDC ER 100%, PR 100%, HER-2 negative, Ki-67 15%  T2N0 stage IB 03/10/2019: Biopsy of the right breast: DCIS, ER 80%, PR 40%   Recommendations: 1. Neoadjuvant antiestrogen therapy anastrozole 1 mg daily 02/17/2019 2. Breast conserving surgery 09/05/2019 3. Adjuvant radiation 11/07/2019-12/01/2019 4.  Continuation of adjuvant antiestrogen therapy --------------------------------------------------------------------------------------------------------------------------- Current  treatment: Started as neoadjuvant therapy with anastrozole 1 mg daily started 02/17/2019, now on adjuvant anastrozole Anastrozole toxicities: Tolerating it well     09/05/2019:Left lumpectomy ([name]): IDC, grade 2, 3.0cm, with intermediate grade DCIS, 2 left axillary lymph nodes negative.  Invasive carcinoma present posterior margin focally, inferior margin focally, anterior superior margin broadly and anterior margin broadly. Right lumpectomy: Benign 10/03/2019: Resection of the margins: Negative for malignancy.    Breast cancer surveillance: 1. 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  Patient wants to see Dr. Brantley Stage for resection of the right medial breast epidermoid cyst. 2. mammogram scheduled for 07/10/22   Return to clinic in 1 year for follow-up.    No orders of the defined types were placed in this encounter.  The patient has a good understanding of the overall plan. she agrees with it. she will call with any problems that may develop before the next visit here. Total time spent: 30 mins including  face to face time and time spent for planning, charting and co-ordination of care   Harriette Ohara, MD 07/06/22    I Gardiner Coins am acting as a Education administrator for [employer]  I have reviewed the above documentation for accuracy and completeness, and I agree with the above.

## 2022-07-05 NOTE — Assessment & Plan Note (Signed)
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 Neoadjuvant antiestrogen therapy anastrozole 1 mg daily 02/17/2019 2. Breast conserving surgery 09/05/2019 3. Adjuvant radiation therapy followed by (patient is also not keen on adjuvant radiation) 4.  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  Invasive carcinoma present posterior margin focally, inferior margin focally, anterior superior margin broadly and anterior margin broadly. Right lumpectomy: Benign 10/03/2019: Resection of the margins: Negative for malignancy.   Adjuvant radiation 11/07/2019-12/01/2019   Palpable nodules on the abdomen and the chest wall: Patient saw Dr. Brantley Stage who is planning to do a biopsy.  She also has mammograms set up for tomorrow.   Breast cancer surveillance: 1. Breast exam 07/06/2022: Left breast: Large palpable abnormality that is freely mobile in the left breast. 2. mammogram scheduled for 07/10/22   return to clinic in 1 year for follow-up

## 2022-07-06 ENCOUNTER — Inpatient Hospital Stay: Payer: Medicare HMO | Attending: Hematology and Oncology | Admitting: Hematology and Oncology

## 2022-07-06 ENCOUNTER — Telehealth: Payer: Self-pay | Admitting: Hematology and Oncology

## 2022-07-06 VITALS — BP 146/64 | HR 72 | Temp 97.7°F | Resp 15 | Wt 131.5 lb

## 2022-07-06 DIAGNOSIS — Z79811 Long term (current) use of aromatase inhibitors: Secondary | ICD-10-CM | POA: Diagnosis not present

## 2022-07-06 DIAGNOSIS — C50111 Malignant neoplasm of central portion of right female breast: Secondary | ICD-10-CM | POA: Insufficient documentation

## 2022-07-06 DIAGNOSIS — C50512 Malignant neoplasm of lower-outer quadrant of left female breast: Secondary | ICD-10-CM | POA: Diagnosis not present

## 2022-07-06 DIAGNOSIS — Z170 Estrogen receptor positive status [ER+]: Secondary | ICD-10-CM | POA: Diagnosis not present

## 2022-07-06 MED ORDER — ANASTROZOLE 1 MG PO TABS: 1.0000 mg | ORAL_TABLET | Freq: Every day | ORAL | 4 refills | Status: DC

## 2022-07-06 NOTE — Telephone Encounter (Signed)
Scheduled appointment per 3/18 los. Patient is aware of the made appointment.

## 2022-07-10 ENCOUNTER — Ambulatory Visit
Admission: RE | Admit: 2022-07-10 | Discharge: 2022-07-10 | Disposition: A | Discharge status: Home / Self Care | Payer: Medicare HMO | Source: Ambulatory Visit | Attending: Surgery | Admitting: Surgery

## 2022-07-10 DIAGNOSIS — Z853 Personal history of malignant neoplasm of breast: Secondary | ICD-10-CM

## 2022-07-23 ENCOUNTER — Encounter: Payer: Self-pay | Admitting: Neurology

## 2022-07-23 ENCOUNTER — Ambulatory Visit: Payer: Medicare HMO | Admitting: Neurology

## 2022-07-23 VITALS — BP 126/66 | HR 68 | Ht 64.0 in | Wt 134.0 lb

## 2022-07-23 DIAGNOSIS — Z87898 Personal history of other specified conditions: Secondary | ICD-10-CM

## 2022-07-23 DIAGNOSIS — R251 Tremor, unspecified: Secondary | ICD-10-CM | POA: Diagnosis not present

## 2022-07-23 MED ORDER — PRIMIDONE 50 MG PO TABS: ORAL_TABLET | ORAL | 1 refills | Status: DC

## 2022-07-23 NOTE — Patient Instructions (Signed)
Verbal instructions given.  Continue with primidone at the current regimen, prescription renewed.  Follow-up in 1 year to see Megan.

## 2022-07-23 NOTE — Progress Notes (Signed)
Subjective:    Patient ID: Robin Anderson is a 82 y.o. female.  HPI    Interim history:   [name] is a 82 year old female with an underlying medical history of seizure disorder, tremors, breast cancer, cataracts, hypertension, hyperlipidemia, and prediabetes, who presents for follow-up consultation of her tremors.  She also has a history of seizures.  The patient is unaccompanied today. She used to see Dr. Margette Fast in this office and has also been followed by the nurse practitioner.  She last saw Vaughan Browner, NP on 01/21/2022, at which time she reported that her tremor in her right hand was worse.  She was taking primidone 50 mg strength, 1 tablet in the morning and 2 tablets at bedtime.  She reported no recent seizure-like activity.  She was advised to take primidone 1 pill 3 times daily.    Today, 07/23/2022: She reports feeling stable with regards to her hand tremor.  She takes Mysoline 1 pill in the morning and 2 at night, she would like to stay with this regimen, denies any side effects.  Denies any recent falls or seizures.  She does not drive.  Of note, she no longer takes Keppra.  She hydrates well with water and drinks 1 cup of coffee in the morning.  She drinks no alcohol.  She estimates that she drinks about 3 bottles of water per day.  Sometimes she has cramping in her left hand, denies any neck pain.  The patient's allergies, current medications, family history, past medical history, past social history, past surgical history and problem list were reviewed and updated as appropriate.   Previously:  01/21/2022 (MM): <<[name] is a 82 year old female with a history of tremors and seizures.  She returns today for follow-up.  She remains on primidone for tremor.  Currently taking 50 mg in the morning and 100 mg at bedtime. she feels that the tremor has gotten worse in the right hand.  Notices some tremor with food and handwriting. No trouble with buttons. Lives at home  with her husband. No longer drives.  Denies any seizure-like events.   01/20/21: [name] is a 82 y.o. female with a history of tremors. She is here today for a follow up. Today patient expresses increased tremor in right hand compared to her left. She denies difficulty getting dressed or having gait imbalances and falls. However she does endorse some minor difficulties with feeding and handwriting. She denies excess sleepiness or fatigue. She reports that she has been taking the Primidone 50 MG in the morning, at noon, and in the evening. Since taking the medication this way she expresses that her tremors have not improved and the right hand tremor has been exacerbated. She denies any seizure activity at this time. She has no other complaints at this time.   HISTORY  01/17/20: [name] is a 82 year old female with a history of seizure disorder and essential tremor. She returns today for follow-up. Denies any seizures. Continues on primidone 50 mg 3 tablets at bedtime. Reports that she notices the tremor with handwriting. Feels that it might be worse. She had breast cancer had surgery in May and again in June. She is having swelling in the left breast. Will follow-up with surgeon on Monday. She returns today for follow-up.   07/12/19  [name] is a 82 year old female with history of seizure disorder and tremor in her right hand. She has previously been on Keppra, but stopped due to report  of moles on her face and scalp. She remains on primidone for seizure prevention and tremor. Her last seizure occurred in 2018 while taking Keppra 250 mg in the morning, 500 mg in the evening. The seizure episode was described as having woken up in the morning, had bitten her lip. She is taking primidone 150 mg at bedtime. She has breast cancer, is on oral chemo.  She has not had recurrent seizure.  The tremor is improved with primidone in the right hand, it may increase during times of anxiety. When she takes the  primidone at night, she may have a chalky taste in her mouth.  She does not drive a car.  She presents today for follow-up unaccompanied. >>  07/21/2016 (Dr. Roxy Cedar): <<[name] is a 82 year old right-handed black female with a history of seizure events. The patient has had an event 2 or 3 weeks ago where she woke up having bitten her lip. The patient was not sore with the muscles, no headache was noted. She is not sure if anything happened during the night, her husband never wakes up, he sleeps too soundly. The patient is on Keppra taking 250 mg in the morning and 500 mg in the evening. She is tolerating the drug well, she denies any irritability or drowsiness on the medication. She does not operate a motor vehicle. She returns for an evaluation.>>  Her Past Medical History Is Significant For: Past Medical History:  Diagnosis Date   Blood in stool    Cancer    breast - left   Cataracts, bilateral    Chest pain, unspecified    Family history of breast cancer    Headache 07/02/2014   Heart murmur    Hepatitis    History of colon polyps    HLD (hyperlipidemia)    HTN (hypertension)    Prediabetes 07/28/2011   no meds, diet controlled   Seizure disorder    seizures controlled with med   Wears glasses     Her Past Surgical History Is Significant For: Past Surgical History:  Procedure Laterality Date   ABDOMINAL HERNIA REPAIR     lower mid line hernia which has reherniatied   BREAST BIOPSY Left 01/31/2019   Malignant   BREAST LUMPECTOMY Left 09/05/2019   re-excision 10/03/19   BREAST LUMPECTOMY WITH RADIOACTIVE SEED AND SENTINEL LYMPH NODE BIOPSY N/A 09/05/2019   Procedure: RIGHT BREAST SEED LUMPECTOMY X 1, LEFT BREAST SEED LUMPECTOMY X 2, LEFT AXILLARY SENTINEL LYMPH NODE BIOPSY;  Surgeon: Erroll Luna, MD;  Location: [facility];  Service: General;  Laterality: N/A;   BUNIONECTOMY Right    traumatic injury to her foot that was repaired   RE-EXCISION OF BREAST LUMPECTOMY Left  10/03/2019   Procedure: LEFT BREAST RE-EXCISION OF BREAST LUMPECTOMY;  Surgeon: Erroll Luna, MD;  Location: [facility];  Service: General;  Laterality: Left;   TONSILLECTOMY     TUBAL LIGATION      Her Family History Is Significant For: Family History  Problem Relation Age of Onset   Hypertension Mother    Heart attack Father    Hypertension Sister        5 sisters....3 have HTN   Breast cancer Sister    Hypertension Brother    Heart attack Daughter    Brain cancer Son    Tremor Neg Hx     Her Social History Is Significant For: Social History   Socioeconomic History   Marital status: Married    Spouse name: [name]  Number of children: 6   Years of education: 12   Highest education level: Not on file  Occupational History   Occupation: retired  Tobacco Use   Smoking status: Former    Types: Cigarettes   Smokeless tobacco: Never   Tobacco comments:    when she was young for a short period of time  Vaping Use   Vaping Use: Never used  Substance and Sexual Activity   Alcohol use: No    Alcohol/week: 0.0 standard drinks of alcohol   Drug use: No   Sexual activity: Not on file  Other Topics Concern   Not on file  Social History Narrative   Patient lives at home with her husband Kendyl Robin Anderson.   Retired.   Education high school.   Right handed..      Regular exercise-yes   Caffeine Use-yes, tea    Social Determinants of Health   Financial Resource Strain: Not on file  Food Insecurity: Not on file  Transportation Needs: No Transportation Needs (02/14/2019)   PRAPARE - Hydrologist (Medical): No    Lack of Transportation (Non-Medical): No  Physical Activity: Not on file  Stress: Not on file  Social Connections: Not on file    Her Allergies Are:  Allergies  Allergen Reactions   Penicillins Rash  :   Her Current Medications Are:  Outpatient Encounter Medications as of 07/23/2022  Medication Sig   anastrozole (ARIMIDEX) 1 MG  tablet Take 1 tablet (1 mg total) by mouth daily.   aspirin EC 81 MG tablet Take 81 mg by mouth daily.   lisinopril-hydrochlorothiazide (ZESTORETIC) 20-25 MG tablet Take 1 tablet by mouth daily.   Multiple Vitamin (MULTIVITAMIN) tablet Take 1 tablet by mouth daily.   simvastatin (ZOCOR) 20 MG tablet TAKE 1 TABLET BY MOUTH EVERYDAY AT BEDTIME   [DISCONTINUED] primidone (MYSOLINE) 50 MG tablet Take 1 tablet (50 mg total) by mouth 3 (three) times daily. (Patient taking differently: Take 50 mg by mouth 3 (three) times daily. 1 tablet in the AM and 2 tablets at night)   MIEBO 1.338 GM/ML SOLN Apply 1 drop to eye daily at 6 (six) AM. (Patient not taking: Reported on 07/23/2022)   primidone (MYSOLINE) 50 MG tablet 1 tablet in the AM and 2 tablets at night   No facility-administered encounter medications on file as of 07/23/2022.  :  Review of Systems:  Out of a complete 14 point review of systems, all are reviewed and negative with the exception of these symptoms as listed below:  Review of Systems  Neurological:        Patient is here alone for seizure follow-up. She is a former patient of Dr Jannifer Franklin. She denies any recent seizures. She states it has been so long ago. She wants to report that twice in the last month she has noticed that her fingers draw up in her L arm while she is working on her puzzles. A pain runs down her arm and then her fingers cramp. She states that since she had breast surgery on that side she has dealt with issues of her 4th and 5th fingers on the left hand not being able to flex as well as the others. She states her cancer doctor told her he thinks those symptoms are coming from her Anastrozole.     Objective:  Neurological Exam  Physical Exam Physical Examination:   Vitals:   07/23/22 1057  BP: 126/66  Pulse: 68  General Examination: The patient is a very pleasant 82 y.o. female in no acute distress. She appears well-developed and well-nourished and well groomed.    HEENT: Normocephalic, atraumatic, pupils are equal, round and reactive to light, extraocular tracking is preserved, hearing appears to be mildly impaired.  Face is symmetric with normal facial animation. Speech is slow. There is no lip, neck/head, jaw or voice tremor. Neck is supple with full range of passive and active motion. There are no carotid bruits on auscultation. Oropharynx exam reveals: moderate mouth dryness, adequate dental hygiene. Tongue protrudes centrally and palate elevates symmetrically.   Chest: Clear to auscultation without wheezing, rhonchi or crackles noted.  Heart: S1+S2+0, regular and normal without murmurs, rubs or gallops noted.   Abdomen: Soft, non-tender and non-distended.  Extremities: There is no obvious edema in the distal lower extremities bilaterally.   Skin: Warm and dry without trophic changes noted.   Musculoskeletal: exam reveals no obvious joint deformities.   Neurologically:  Mental status: The patient is awake, alert and oriented in all 4 spheres. Her immediate and remote memory, attention, language skills and fund of knowledge are appropriate. There is no evidence of aphasia, agnosia, apraxia or anomia. Speech is clear with normal prosody and enunciation. Thought process is linear. Mood is normal and affect is normal.  Cranial nerves II - XII are as described above under HEENT exam.  Motor exam: Thin bulk, global strength of 4 out of 5.  Normal tone.  There is no obvious action or resting tremor.  No significant postural or intention tremor. Fine motor skills and coordination: grossly intact.  Cerebellar testing: No dysmetria or intention tremor. There is no truncal or gait ataxia.  Sensory exam: intact to light touch in the upper and lower extremities.  Gait, station and balance: She stands easily. No veering to one side is noted. No leaning to one side is noted. Posture is age-appropriate and stance is narrow based. Gait shows normal stride length  and normal pace. No problems turning are noted.  No walking aid.  Assessment and Plan:  In summary, Robin Anderson is a very pleasant 82 y.o.-year old female with an underlying medical history of seizure disorder, tremors, breast cancer, cataracts, hypertension, hyperlipidemia, and prediabetes, who presents for follow-up consultation of her tremors.  She also has a history of seizures.  She reports no recent seizures, she is no longer on Keppra.  She feels stable on primidone for her tremors, no obvious tremor visible today on exam.  She was offered lowering the dose of primidone to 1 pill twice daily but would like to stay with the current regimen, 50 mg in the morning and 100 mg at night.  She seems to tolerate this well.  She is advised to stay well-hydrated and limit caffeine as she is.  She is advised to follow-up routinely in 1 year to see Vaughan Browner, NP.  I renewed her prescription from primidone.  I answered all her questions today and she was in agreement.   I spent 30 minutes in total face-to-face time and in reviewing records during pre-charting, more than 50% of which was spent in counseling and coordination of care, reviewing test results, reviewing medications and treatment regimen and/or in discussing or reviewing the diagnosis of tremor, history of seizures, the prognosis and treatment options. Pertinent laboratory and imaging test results that were available during this visit with the patient were reviewed by me and considered in my medical decision making (see chart for details). '

## 2022-09-21 NOTE — Progress Notes (Unsigned)
Office Visit    Patient Name: Robin Anderson Date of Encounter: 09/22/2022  PCP:  Robin Back, NP   [facility]  Cardiologist:  Robin Pyo, MD  Advanced Practice Provider:  No care team member to display Electrophysiologist:  None   HPI    Robin Anderson is a 82 y.o. female with a past medical history of hypertension, hyperlipidemia, breast cancer and seizure disorder presents today for follow-up appointment.  Was seen in the clinic 09/26/2021 where she was doing well from a CV standpoint.  TTE with strain 10/25/2020 with LVEF 70 to 75%, normal diastolic function, no significant valvular disease.  Seen 09/2021 where she was doing well from a CV standpoint.  She was last seen December 2023 by Robin Anderson.  Was feeling well at that time.  Was having some right lower extremity ankle edema for the past 2 months which was new.  No pain in the leg and no breathing issues.  No recent injury.  Symptoms improved somewhat with elevation but do not completely resolve.  Additionally, she mentions that sometimes she feels some pain down her left arm which will spasm her hand.  She is planning to discuss this further with her neurologist.  Otherwise, at that appointment she was having no new CV issues.  Denied palpitations, chest pain, shortness of breath.  No lightheadedness, headaches, syncope, orthopnea, or PND.  Today, she tells me that she has not had any recent issues with her heart. No chest pain or SOB. She does have a low BP today which is unusual for her. We discussed that she will need to send [place] a week worth of BP to see if any medication adjustments are needed. Discussed possible issues with low BP including lightheadedness, dizziness, or syncope. These symptoms put her at risk for falls. She is very concerned about the removal of a breast cyst today. Will relay the message to Dr. Luisa Anderson   Reports no shortness of breath nor dyspnea on exertion. Reports no  chest pain, pressure, or tightness. No edema, orthopnea, PND. Reports no palpitations.    Past Medical History    Past Medical History:  Diagnosis Date   Blood in stool    Cancer (HCC)    breast - left   Cataracts, bilateral    Chest pain, unspecified    Family history of breast cancer    Headache 07/02/2014   Heart murmur    Hepatitis    History of colon polyps    HLD (hyperlipidemia)    HTN (hypertension)    Prediabetes 07/28/2011   no meds, diet controlled   Seizure disorder (HCC)    seizures controlled with med   Wears glasses    Past Surgical History:  Procedure Laterality Date   ABDOMINAL HERNIA REPAIR     lower mid line hernia which has reherniatied   BREAST BIOPSY Left 01/31/2019   Malignant   BREAST LUMPECTOMY Left 09/05/2019   re-excision 10/03/19   BREAST LUMPECTOMY WITH RADIOACTIVE SEED AND SENTINEL LYMPH NODE BIOPSY N/A 09/05/2019   Procedure: RIGHT BREAST SEED LUMPECTOMY X 1, LEFT BREAST SEED LUMPECTOMY X 2, LEFT AXILLARY SENTINEL LYMPH NODE BIOPSY;  Surgeon: Harriette Bouillon, MD;  Location: [facility] OR;  Service: General;  Laterality: N/A;   BUNIONECTOMY Right    traumatic injury to her foot that was repaired   RE-EXCISION OF BREAST LUMPECTOMY Left 10/03/2019   Procedure: LEFT BREAST RE-EXCISION OF BREAST LUMPECTOMY;  Surgeon: Harriette Bouillon, MD;  Location: [facility] OR;  Service: General;  Laterality: Left;   TONSILLECTOMY     TUBAL LIGATION      Allergies  Allergies  Allergen Reactions   Penicillins Rash    EKGs/Labs/Other Studies Reviewed:   The following studies were reviewed today: Cardiac Studies & Procedures       ECHOCARDIOGRAM  ECHOCARDIOGRAM COMPLETE 10/25/2020  Narrative ECHOCARDIOGRAM REPORT    Patient Name:   Robin Anderson Date of Exam: 10/25/2020 Medical Rec #:  [id number]           Height:       64.0 in Accession #:    [id number]          Weight:       134.2 lb Date of Birth:  05/03/1940           BSA:          1.651 m Patient Age:     82 years            BP:           130/74 mmHg Patient Gender: F                   HR:           69 bpm. Exam Location:  [address]  Procedure: 2D Echo, 3D Echo, Color Doppler, Cardiac Doppler and Strain Analysis  Indications:    R01.1 Murmur  History:        Patient has no prior history of Echocardiogram examinations. Signs/Symptoms:Chest Pain and Murmur; Risk Factors:Hypertension, Dyslipidemia, Family History of Coronary Artery Disease and Former Smoker. Left Breast Cancer status post lumpectomy and Radiation.  Sonographer:    Robin Anderson RDCS Referring Phys: [id number] Robin Anderson  IMPRESSIONS   1. Left ventricular ejection fraction, by estimation, is 70 to 75%. Left ventricular ejection fraction by 3D volume is 78 %. The left ventricle has hyperdynamic function. The left ventricle has no regional wall motion abnormalities. Left ventricular diastolic parameters were normal. The average left ventricular global longitudinal strain is 26.4 %. The global longitudinal strain is normal. 2. Right ventricular systolic function is hyperdynamic. The right ventricular size is normal. There is normal pulmonary artery systolic pressure. 3. The mitral valve is normal in structure. No evidence of mitral valve regurgitation. No evidence of mitral stenosis. 4. The aortic valve is tricuspid. Aortic valve regurgitation is not visualized. No aortic stenosis is present. 5. The inferior vena cava is normal in size with greater than 50% respiratory variability, suggesting right atrial pressure of 3 mmHg.  Conclusion(s)/Recommendation(s): Murmur is probably related to high cardiac output state (consider anemia, thyrotoxicosis, infection, etc).  FINDINGS Left Ventricle: Left ventricular ejection fraction, by estimation, is 70 to 75%. Left ventricular ejection fraction by 3D volume is 78 %. The left ventricle has hyperdynamic function. The left ventricle has no regional wall motion  abnormalities. The average left ventricular global longitudinal strain is 26.4 %. The global longitudinal strain is normal. The left ventricular internal cavity size was normal in size. There is no left ventricular hypertrophy. Left ventricular diastolic parameters were normal. Normal left ventricular filling pressure.  Right Ventricle: The right ventricular size is normal. No increase in right ventricular wall thickness. Right ventricular systolic function is hyperdynamic. There is normal pulmonary artery systolic pressure. The tricuspid regurgitant velocity is 2.66 m/s, and with an assumed right atrial pressure of 3 mmHg, the estimated right ventricular systolic pressure is 31.3 mmHg.  Left Atrium: Left atrial  size was normal in size.  Right Atrium: Right atrial size was normal in size.  Pericardium: There is no evidence of pericardial effusion.  Mitral Valve: The mitral valve is normal in structure. No evidence of mitral valve regurgitation. No evidence of mitral valve stenosis.  Tricuspid Valve: The tricuspid valve is normal in structure. Tricuspid valve regurgitation is trivial. No evidence of tricuspid stenosis.  Aortic Valve: The aortic valve is tricuspid. Aortic valve regurgitation is not visualized. No aortic stenosis is present.  Pulmonic Valve: The pulmonic valve was normal in structure. Pulmonic valve regurgitation is not visualized. No evidence of pulmonic stenosis.  Aorta: The aortic root is normal in size and structure.  Venous: The inferior vena cava is normal in size with greater than 50% respiratory variability, suggesting right atrial pressure of 3 mmHg.  IAS/Shunts: No atrial level shunt detected by color flow Doppler.   LEFT VENTRICLE PLAX 2D LVIDd:         3.20 cm         Diastology LVIDs:         1.80 cm         LV e' medial:    6.31 cm/s LV PW:         0.70 cm         LV E/e' medial:  13.8 LV IVS:        0.50 cm         LV e' lateral:   9.57 cm/s LVOT diam:      1.95 cm         LV E/e' lateral: 9.1 LV SV:         91 LV SV Index:   55              2D LVOT Area:     2.99 cm        Longitudinal Strain 2D Strain GLS  22.5 % (A2C): 2D Strain GLS  30.4 % (A3C): 2D Strain GLS  26.4 % (A4C): 2D Strain GLS  26.4 % Avg:  3D Volume EF LV 3D EF:    Left ventricular ejection fraction by 3D volume is 78 %.  3D Volume EF: 3D EF:        78 % LV EDV:       110 ml LV ESV:       24 ml LV SV:        85 ml  RIGHT VENTRICLE RV S prime:     18.60 cm/s TAPSE (M-mode): 3.0 cm  LEFT ATRIUM             Index       RIGHT ATRIUM           Index LA diam:        2.90 cm 1.76 cm/m  RA Area:     14.60 cm LA Vol (A2C):   48.0 ml 29.07 ml/m RA Volume:   33.20 ml  20.11 ml/m LA Vol (A4C):   42.6 ml 25.80 ml/m LA Biplane Vol: 50.4 ml 30.52 ml/m AORTIC VALVE LVOT Vmax:   113.00 cm/s LVOT Vmean:  76.300 cm/s LVOT VTI:    0.305 m  AORTA Ao Root diam: 3.10 cm Ao Asc diam:  3.30 cm  MITRAL VALVE               TRICUSPID VALVE MV Area (PHT): cm         TR Peak grad:   28.3 mmHg MV Decel Time: 337  msec    TR Vmax:        266.00 cm/s MV E velocity: 87.00 cm/s MV A velocity: 72.20 cm/s  SHUNTS MV E/A ratio:  1.20        Systemic VTI:  0.30 m Systemic Diam: 1.95 cm  Rachelle Hora Croitoru MD Electronically signed by Thurmon Fair MD Signature Date/Time: 10/25/2020/1:32:24 PM    Final              EKG:  EKG is not ordered today.    Recent Labs: 01/21/2022: ALT 9; BUN 9; Creatinine, Ser 1.03; Hemoglobin 10.7; Platelets 155; Potassium 3.3; Sodium 138  Recent Lipid Panel    Component Value Date/Time   CHOL 172 04/11/2019 1032   TRIG 59 04/11/2019 1032   HDL 79 04/11/2019 1032   CHOLHDL 2.2 04/11/2019 1032   CHOLHDL 2.2 08/27/2015 1402   VLDL 10 08/27/2015 1402   LDLCALC 81 04/11/2019 1032    Home Medications   Current Meds  Medication Sig   anastrozole (ARIMIDEX) 1 MG tablet Take 1 tablet (1 mg total) by mouth daily.   aspirin EC 81 MG  tablet Take 81 mg by mouth daily.   lisinopril-hydrochlorothiazide (ZESTORETIC) 20-25 MG tablet Take 1 tablet by mouth daily.   LUMIGAN 0.01 % SOLN Place 1 drop into both eyes at bedtime.   MIEBO 1.338 GM/ML SOLN Apply 1 drop to eye daily at 6 (six) AM.   Multiple Vitamin (MULTIVITAMIN) tablet Take 1 tablet by mouth daily.   primidone (MYSOLINE) 50 MG tablet 1 tablet in the AM and 2 tablets at night   simvastatin (ZOCOR) 20 MG tablet TAKE 1 TABLET BY MOUTH EVERYDAY AT BEDTIME     Review of Systems      All other systems reviewed and are otherwise negative except as noted above.  Physical Exam    VS:  BP (!) 101/56   Pulse 68   Ht 5\' 4"  (1.626 m)   Wt 131 lb 12.8 oz (59.8 kg)   LMP  (LMP Unknown)   SpO2 98%   BMI 22.62 kg/m  , BMI Body mass index is 22.62 kg/m.  Wt Readings from Last 3 Encounters:  09/22/22 131 lb 12.8 oz (59.8 kg)  07/23/22 134 lb (60.8 kg)  07/06/22 131 lb 8 oz (59.6 kg)     GEN: Well nourished, well developed, in no acute distress. HEENT: normal. Neck: Supple, no JVD, carotid bruits, or masses. Cardiac: RRR, no murmurs, rubs, or gallops. No clubbing, cyanosis, edema.  Radials/PT 2+ and equal bilaterally.  Respiratory:  Respirations regular and unlabored, clear to auscultation bilaterally. GI: Soft, nontender, nondistended. MS: No deformity or atrophy. Skin: Warm and dry, no rash. Neuro:  Strength and sensation are intact. Psych: Normal affect.  Assessment & Plan    Asymmetric right lower extremity edema -LE [place] ordered to r/o DVT which was negative -very small amount of edema today and more around the outside of her ankle -compression recommended when she is on her feet  Hypertension -low normal today and patient is asymptomatic at this time -we discussed the symptoms of hypotension and asked her to keep track of her BP for a week and send me those values -we can then adjust medications if needed  Hyperlipidemia -her PCP looks after her  cholesterol -we have asked her to have her PCP send those labs to [place] so we can scan them into the chart -continue simvastatin for now  Breast cancer -two surgeries on her left side,  she now has a cyst on her right side  -will reach out to Dr. Luisa Anderson  -I've also encouraged the patient to call on her end to arrange follow-up  Systolic murmur -echo done 2022 reviewed -no significant valve disease -would recommend f/u echo next year for re-evaluation  Seizure disorder/left arm neuropathy -she did see neuro but she cannot remember when. It was after her last appointment with [place]       Disposition: Follow up 1 year with Robin Pyo, MD or APP.  Signed, Sharlene Dory, PA-C 09/22/2022, 8:36 AM [facility]

## 2022-09-22 ENCOUNTER — Encounter: Payer: Self-pay | Admitting: Physician Assistant

## 2022-09-22 ENCOUNTER — Ambulatory Visit: Payer: Medicare HMO | Attending: Physician Assistant | Admitting: Physician Assistant

## 2022-09-22 VITALS — BP 101/56 | HR 68 | Ht 64.0 in | Wt 131.8 lb

## 2022-09-22 DIAGNOSIS — I10 Essential (primary) hypertension: Secondary | ICD-10-CM

## 2022-09-22 DIAGNOSIS — G40909 Epilepsy, unspecified, not intractable, without status epilepticus: Secondary | ICD-10-CM

## 2022-09-22 DIAGNOSIS — E785 Hyperlipidemia, unspecified: Secondary | ICD-10-CM

## 2022-09-22 DIAGNOSIS — E876 Hypokalemia: Secondary | ICD-10-CM

## 2022-09-22 DIAGNOSIS — R011 Cardiac murmur, unspecified: Secondary | ICD-10-CM

## 2022-09-22 DIAGNOSIS — Z79899 Other long term (current) drug therapy: Secondary | ICD-10-CM

## 2022-09-22 DIAGNOSIS — R2241 Localized swelling, mass and lump, right lower limb: Secondary | ICD-10-CM | POA: Diagnosis not present

## 2022-09-22 NOTE — Patient Instructions (Signed)
Medication Instructions:  Your physician recommends that you continue on your current medications as directed. Please refer to the Current Medication list given to you today.  *If you need a refill on your cardiac medications before your next appointment, please call your pharmacy*  Lab Work: Lipids and lft's with primary care provider when you see them If you have labs (blood work) drawn today and your tests are completely normal, you will receive your results only by: MyChart Message (if you have MyChart) OR A paper copy in the mail If you have any lab test that is abnormal or we need to change your treatment, we will call you to review the results.  Follow-Up: At [facility], you and your health needs are our priority.  As part of our continuing mission to provide you with exceptional heart care, we have created designated Provider Care Teams.  These Care Teams include your primary Cardiologist (physician) and Advanced Practice Providers (APPs -  Physician Assistants and Nurse Practitioners) who all work together to provide you with the care you need, when you need it.  We recommend signing up for the patient portal called "MyChart".  Sign up information is provided on this After Visit Summary.  MyChart is used to connect with patients for Virtual Visits (Telemedicine).  Patients are able to view lab/test results, encounter notes, upcoming appointments, etc.  Non-urgent messages can be sent to your provider as well.   To learn more about what you can do with MyChart, go to [email or web address].    Your next appointment:   1 year(s)  Provider:   Dr Lynnette Caffey  Check your blood pressure daily, 1 hr after morning medications for 2 weeks, keep a log and send [place] the readings through mychart at the end of the 2 weeks.   Low-Sodium Eating Plan Salt (sodium) helps you keep a healthy balance of fluids in your body. Too much sodium can raise your blood pressure. It can also cause fluid  and waste to be held in your body. Your health care provider or dietitian may recommend a low-sodium eating plan if you have high blood pressure (hypertension), kidney disease, liver disease, or heart failure. Eating less sodium can help lower your blood pressure and reduce swelling. It can also protect your heart, liver, and kidneys. What are tips for following this plan? Reading food labels  Check food labels for the amount of sodium per serving. If you eat more than one serving, you must multiply the listed amount by the number of servings. Choose foods with less than 140 milligrams (mg) of sodium per serving. Avoid foods with 300 mg of sodium or more per serving. Always check how much sodium is in a product, even if the label says "unsalted" or "no salt added." Shopping  Buy products labeled as "low-sodium" or "no salt added." Buy fresh foods. Avoid canned foods and pre-made or frozen meals. Avoid canned, cured, or processed meats. Buy breads that have less than 80 mg of sodium per slice. Cooking  Eat more home-cooked food. Try to eat less restaurant, buffet, and fast food. Try not to add salt when you cook. Use salt-free seasonings or herbs instead of table salt or sea salt. Check with your provider or pharmacist before using salt substitutes. Cook with plant-based oils, such as canola, sunflower, or olive oil. Meal planning When eating at a restaurant, ask if your food can be made with less salt or no salt. Avoid dishes labeled as brined, pickled, cured, or  smoked. Avoid dishes made with soy sauce, miso, or teriyaki sauce. Avoid foods that have monosodium glutamate (MSG) in them. MSG may be added to some restaurant food, sauces, soups, bouillon, and canned foods. Make meals that can be grilled, baked, poached, roasted, or steamed. These are often made with less sodium. General information Try to limit your sodium intake to 1,500-2,300 mg each day, or the amount told by your  provider. What foods should I eat? Fruits Fresh, frozen, or canned fruit. Fruit juice. Vegetables Fresh or frozen vegetables. "No salt added" canned vegetables. "No salt added" tomato sauce and paste. Low-sodium or reduced-sodium tomato and vegetable juice. Grains Low-sodium cereals, such as oats, puffed wheat and rice, and shredded wheat. Low-sodium crackers. Unsalted rice. Unsalted pasta. Low-sodium bread. Whole grain breads and whole grain pasta. Meats and other proteins Fresh or frozen meat, poultry, seafood, and fish. These should have no added salt. Low-sodium canned tuna and salmon. Unsalted nuts. Dried peas, beans, and lentils without added salt. Unsalted canned beans. Eggs. Unsalted nut butters. Dairy Milk. Soy milk. Cheese that is naturally low in sodium, such as ricotta cheese, fresh mozzarella, or Swiss cheese. Low-sodium or reduced-sodium cheese. Cream cheese. Yogurt. Seasonings and condiments Fresh and dried herbs and spices. Salt-free seasonings. Low-sodium mustard and ketchup. Sodium-free salad dressing. Sodium-free light mayonnaise. Fresh or refrigerated horseradish. Lemon juice. Vinegar. Other foods Homemade, reduced-sodium, or low-sodium soups. Unsalted popcorn and pretzels. Low-salt or salt-free chips. The items listed above may not be all the foods and drinks you can have. Talk to a dietitian to learn more. What foods should I avoid? Vegetables Sauerkraut, pickled vegetables, and relishes. Olives. [place] fries. Onion rings. Regular canned vegetables, except low-sodium or reduced-sodium items. Regular canned tomato sauce and paste. Regular tomato and vegetable juice. Frozen vegetables in sauces. Grains Instant hot cereals. Bread stuffing, pancake, and biscuit mixes. Croutons. Seasoned rice or pasta mixes. Noodle soup cups. Boxed or frozen macaroni and cheese. Regular salted crackers. Self-rising flour. Meats and other proteins Meat or fish that is salted, canned, smoked,  spiced, or pickled. Precooked or cured meat, such as sausages or meat loaves. [place]. Ham. Pepperoni. Hot dogs. Corned beef. Chipped beef. Salt pork. Jerky. Pickled herring, anchovies, and sardines. Regular canned tuna. Salted nuts. Dairy Processed cheese and cheese spreads. Hard cheeses. Cheese curds. Blue cheese. Feta cheese. String cheese. Regular cottage cheese. Buttermilk. Canned milk. Fats and oils Salted butter. Regular margarine. Ghee. Bacon fat. Seasonings and condiments Onion salt, garlic salt, seasoned salt, table salt, and sea salt. Canned and packaged gravies. Worcestershire sauce. Tartar sauce. Barbecue sauce. Teriyaki sauce. Soy sauce, including reduced-sodium soy sauce. Steak sauce. Fish sauce. Oyster sauce. Cocktail sauce. Horseradish that you find on the shelf. Regular ketchup and mustard. Meat flavorings and tenderizers. Bouillon cubes. Hot sauce. Pre-made or packaged marinades. Pre-made or packaged taco seasonings. Relishes. Regular salad dressings. Salsa. Other foods Salted popcorn and pretzels. Corn chips and puffs. Potato and tortilla chips. Canned or dried soups. Pizza. Frozen entrees and pot pies. The items listed above may not be all the foods and drinks you should avoid. Talk to a dietitian to learn more. This information is not intended to replace advice given to you by your health care provider. Make sure you discuss any questions you have with your health care provider. Document Revised: 04/23/2022 Document Reviewed: 04/23/2022 Elsevier Patient Education  2024 [employer]  Heart-Healthy Eating Plan Many factors influence your heart health, including eating and exercise habits. Heart health is also called  coronary health. Coronary risk increases with abnormal blood fat (lipid) levels. A heart-healthy eating plan includes limiting unhealthy fats, increasing healthy fats, limiting salt (sodium) intake, and making other diet and lifestyle changes. What is my plan? Your  health care provider may recommend that: You limit your fat intake to _________% or less of your total calories each day. You limit your saturated fat intake to _________% or less of your total calories each day. You limit the amount of cholesterol in your diet to less than _________ mg per day. You limit the amount of sodium in your diet to less than _________ mg per day. What are tips for following this plan? Cooking Cook foods using methods other than frying. Baking, boiling, grilling, and broiling are all good options. Other ways to reduce fat include: Removing the skin from poultry. Removing all visible fats from meats. Steaming vegetables in water or broth. Meal planning  At meals, imagine dividing your plate into fourths: Fill one-half of your plate with vegetables and green salads. Fill one-fourth of your plate with whole grains. Fill one-fourth of your plate with lean protein foods. Eat 2-4 cups of vegetables per day. One cup of vegetables equals 1 cup (91 g) broccoli or cauliflower florets, 2 medium carrots, 1 large bell pepper, 1 large sweet potato, 1 large tomato, 1 medium white potato, 2 cups (150 g) raw leafy greens. Eat 1-2 cups of fruit per day. One cup of fruit equals 1 small apple, 1 large banana, 1 cup (237 g) mixed fruit, 1 large orange,  cup (82 g) dried fruit, 1 cup (240 mL) 100% fruit juice. Eat more foods that contain soluble fiber. Examples include apples, broccoli, carrots, beans, peas, and barley. Aim to get 25-30 g of fiber per day. Increase your consumption of legumes, nuts, and seeds to 4-5 servings per week. One serving of dried beans or legumes equals  cup (90 g) cooked, 1 serving of nuts is  oz (12 almonds, 24 pistachios, or 7 walnut halves), and 1 serving of seeds equals  oz (8 g). Fats Choose healthy fats more often. Choose monounsaturated and polyunsaturated fats, such as olive and canola oils, avocado oil, flaxseeds, walnuts, almonds, and seeds. Eat  more omega-3 fats. Choose salmon, mackerel, sardines, tuna, flaxseed oil, and ground flaxseeds. Aim to eat fish at least 2 times each week. Check food labels carefully to identify foods with trans fats or high amounts of saturated fat. Limit saturated fats. These are found in animal products, such as meats, butter, and cream. Plant sources of saturated fats include palm oil, palm kernel oil, and coconut oil. Avoid foods with partially hydrogenated oils in them. These contain trans fats. Examples are stick margarine, some tub margarines, cookies, crackers, and other baked goods. Avoid fried foods. General information Eat more home-cooked food and less restaurant, buffet, and fast food. Limit or avoid alcohol. Limit foods that are high in added sugar and simple starches such as foods made using white refined flour (white breads, pastries, sweets). Lose weight if you are overweight. Losing just 5-10% of your body weight can help your overall health and prevent diseases such as diabetes and heart disease. Monitor your sodium intake, especially if you have high blood pressure. Talk with your health care provider about your sodium intake. Try to incorporate more vegetarian meals weekly. What foods should I eat? Fruits All fresh, canned (in natural juice), or frozen fruits. Vegetables Fresh or frozen vegetables (raw, steamed, roasted, or grilled). Green salads. Grains Most  grains. Choose whole wheat and whole grains most of the time. Rice and pasta, including Basic rice and pastas made with whole wheat. Meats and other proteins Lean, well-trimmed beef, veal, pork, and lamb. Chicken and [place] without skin. All fish and shellfish. Wild duck, rabbit, pheasant, and venison. Egg whites or low-cholesterol egg substitutes. Dried beans, peas, lentils, and tofu. Seeds and most nuts. Dairy Low-fat or nonfat cheeses, including ricotta and mozzarella. Skim or 1% milk (liquid, powdered, or evaporated). Buttermilk  made with low-fat milk. Nonfat or low-fat yogurt. Fats and oils Non-hydrogenated (trans-free) margarines. Vegetable oils, including soybean, sesame, sunflower, olive, avocado, peanut, safflower, corn, canola, and cottonseed. Salad dressings or mayonnaise made with a vegetable oil. Beverages Water (mineral or sparkling). Coffee and tea. Unsweetened ice tea. Diet beverages. Sweets and desserts Sherbet, gelatin, and fruit ice. Small amounts of dark chocolate. Limit all sweets and desserts. Seasonings and condiments All seasonings and condiments. The items listed above may not be a complete list of foods and beverages you can eat. Contact a dietitian for more options. What foods should I avoid? Fruits Canned fruit in heavy syrup. Fruit in cream or butter sauce. Fried fruit. Limit coconut. Vegetables Vegetables cooked in cheese, cream, or butter sauce. Fried vegetables. Grains Breads made with saturated or trans fats, oils, or whole milk. Croissants. Sweet rolls. Donuts. High-fat crackers, such as cheese crackers and chips. Meats and other proteins Fatty meats, such as hot dogs, ribs, sausage, bacon, rib-eye roast or steak. High-fat deli meats, such as salami and bologna. Caviar. Domestic duck and goose. Organ meats, such as liver. Dairy Cream, sour cream, cream cheese, and creamed cottage cheese. Whole-milk cheeses. Whole or 2% milk (liquid, evaporated, or condensed). Whole buttermilk. Cream sauce or high-fat cheese sauce. Whole-milk yogurt. Fats and oils Meat fat, or shortening. Cocoa butter, hydrogenated oils, palm oil, coconut oil, palm kernel oil. Solid fats and shortenings, including bacon fat, salt pork, lard, and butter. Nondairy cream substitutes. Salad dressings with cheese or sour cream. Beverages Regular sodas and any drinks with added sugar. Sweets and desserts Frosting. Pudding. Cookies. Cakes. Pies. Milk chocolate or white chocolate. Buttered syrups. Full-fat ice cream or ice  cream drinks. The items listed above may not be a complete list of foods and beverages to avoid. Contact a dietitian for more information. Summary Heart-healthy meal planning includes limiting unhealthy fats, increasing healthy fats, limiting salt (sodium) intake and making other diet and lifestyle changes. Lose weight if you are overweight. Losing just 5-10% of your body weight can help your overall health and prevent diseases such as diabetes and heart disease. Focus on eating a balance of foods, including fruits and vegetables, low-fat or nonfat dairy, lean protein, nuts and legumes, whole grains, and heart-healthy oils and fats. This information is not intended to replace advice given to you by your health care provider. Make sure you discuss any questions you have with your health care provider. Document Revised: 05/12/2021 Document Reviewed: 05/12/2021 Elsevier Patient Education  2024 [employer].

## 2022-10-07 ENCOUNTER — Other Ambulatory Visit: Payer: Self-pay

## 2022-10-07 DIAGNOSIS — I10 Essential (primary) hypertension: Secondary | ICD-10-CM

## 2022-10-07 DIAGNOSIS — Z79899 Other long term (current) drug therapy: Secondary | ICD-10-CM

## 2022-10-07 MED ORDER — LISINOPRIL-HYDROCHLOROTHIAZIDE 20-25 MG PO TABS: 1.0000 | ORAL_TABLET | Freq: Every day | ORAL | 3 refills | Status: DC

## 2022-11-18 ENCOUNTER — Other Ambulatory Visit: Payer: Self-pay

## 2022-11-18 MED ORDER — SIMVASTATIN 20 MG PO TABS: ORAL_TABLET | ORAL | 3 refills | Status: DC

## 2023-01-22 ENCOUNTER — Other Ambulatory Visit: Payer: Self-pay | Admitting: Surgery

## 2023-01-26 LAB — SURGICAL PATHOLOGY

## 2023-02-26 ENCOUNTER — Other Ambulatory Visit: Payer: Self-pay | Admitting: Neurology

## 2023-06-01 ENCOUNTER — Other Ambulatory Visit: Payer: Self-pay | Admitting: Hematology and Oncology

## 2023-06-01 DIAGNOSIS — Z9889 Other specified postprocedural states: Secondary | ICD-10-CM

## 2023-06-25 ENCOUNTER — Other Ambulatory Visit: Payer: Self-pay | Admitting: Neurology

## 2023-07-06 ENCOUNTER — Inpatient Hospital Stay: Payer: Medicare HMO | Attending: Hematology and Oncology | Admitting: Hematology and Oncology

## 2023-07-06 VITALS — BP 156/66 | HR 61 | Temp 97.8°F | Resp 17 | Ht 64.0 in | Wt 126.5 lb

## 2023-07-06 DIAGNOSIS — C50111 Malignant neoplasm of central portion of right female breast: Secondary | ICD-10-CM | POA: Diagnosis present

## 2023-07-06 DIAGNOSIS — C50512 Malignant neoplasm of lower-outer quadrant of left female breast: Secondary | ICD-10-CM | POA: Diagnosis present

## 2023-07-06 DIAGNOSIS — Z1732 Human epidermal growth factor receptor 2 negative status: Secondary | ICD-10-CM | POA: Diagnosis not present

## 2023-07-06 DIAGNOSIS — Z79811 Long term (current) use of aromatase inhibitors: Secondary | ICD-10-CM | POA: Diagnosis not present

## 2023-07-06 DIAGNOSIS — Z1721 Progesterone receptor positive status: Secondary | ICD-10-CM | POA: Insufficient documentation

## 2023-07-06 DIAGNOSIS — Z170 Estrogen receptor positive status [ER+]: Secondary | ICD-10-CM | POA: Diagnosis not present

## 2023-07-06 DIAGNOSIS — Z923 Personal history of irradiation: Secondary | ICD-10-CM | POA: Diagnosis not present

## 2023-07-06 MED ORDER — ANASTROZOLE 1 MG PO TABS: 1.0000 mg | ORAL_TABLET | Freq: Every day | ORAL | 1 refills | Status: AC

## 2023-07-06 NOTE — Assessment & Plan Note (Signed)
 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 Neoadjuvant antiestrogen therapy anastrozole 1 mg daily 02/17/2019 2. Breast conserving surgery 09/05/2019 3. Adjuvant radiation 11/07/2019-12/01/2019 4.  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  Invasive carcinoma present posterior margin focally, inferior margin focally, anterior superior margin broadly and anterior margin broadly. Right lumpectomy: Benign 10/03/2019: Resection of the margins: Negative for malignancy.    Breast cancer surveillance: 1. 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  Patient wants to see Dr. Luisa Hart for resection of the right medial breast epidermoid cyst. 2. mammogram scheduled for 07/12/2023   Return to clinic in 1 year for follow-up.

## 2023-07-06 NOTE — Progress Notes (Signed)
 Patient Care Team: Loura Back, NP as PCP - General (Nurse Practitioner) Orbie Pyo, MD as PCP - Cardiology (Cardiology) Pershing Proud, RN as Oncology Nurse Navigator Donnelly Angelica, RN as Oncology Nurse Navigator Lonie Peak, MD as Attending Physician (Radiation Oncology) Harriette Bouillon, MD as Consulting Physician (General Surgery) Serena Croissant, MD as Consulting Physician (Hematology and Oncology)  DIAGNOSIS:  Encounter Diagnoses  Name Primary?   Malignant neoplasm of lower-outer quadrant of left breast of female, estrogen receptor positive (HCC) Yes   Malignant neoplasm of central portion of right breast in female, estrogen receptor positive (HCC)     SUMMARY OF ONCOLOGIC HISTORY: Oncology History  Malignant neoplasm of lower-outer quadrant of left breast of female, estrogen receptor positive (HCC)  01/31/2019 Initial Diagnosis   Palpable lump in the left breast x4 months mammogram 01/27/2019: 2.5 cm mass at 6 o'clock position, 0.7 cm mass retroareolar, axilla negative: Grade 2 IDC ER 100%, PR 100%, HER-2 negative, Ki-67 15% T2N0 stage IB   02/17/2019 Cancer Staging   Staging form: Breast, AJCC 8th Edition - Clinical stage from 02/17/2019: Stage IB (cT2, cN0, cM0, G2, ER+, PR+, HER2-)   01/2019 -  Anti-estrogen oral therapy   Anastrozole   09/05/2019 Surgery   Bilateral lumpectomies ([name]) ([id number]):   Left: IDC, grade 2, 3.0cm, with intermediate grade DCIS, 2 left axillary lymph nodes negative.  Margins positive. Right: No residual carcinoma.  10/03/2019: Resection of margins ([name]) ([id number]): negative for malignancy.   09/05/2019 Cancer Staging   Staging form: Breast, AJCC 8th Edition - Pathologic stage from 09/05/2019: No Stage Recommended (ypT2, pN0, cM0, G2, ER+, PR+, HER2-)   11/06/2019 - 12/01/2019 Radiation Therapy   The patient initially received a dose of 40.05 Gy in 15 fractions to the RIGHT breast and 40.05 Gy in 15 fractions to  the LEFT breast using whole-breast tangent fields. This was delivered using a 3-D conformal technique. The pt received a boost to the LEFT breast delivering an additional 10 Gy in 5 fractions using a electron boost with electrons. The total dose was 50.05 Gy.    Malignant neoplasm of central portion of right breast in female, estrogen receptor positive (HCC)  09/05/2018 Cancer Staging   Staging form: Breast, AJCC 8th Edition - Pathologic stage from 09/05/2018: No Stage Recommended (ypT0, pN0, cM0)   01/2019 -  Anti-estrogen oral therapy   Anastrozole   03/10/2019 Cancer Staging   Staging form: Breast, AJCC 8th Edition - Clinical stage from 03/10/2019: Stage IA (cT48mi, cN0, cM0, G1, ER+, PR+, HER2-)   09/05/2019 Surgery   Bilateral lumpectomies ([name]) ([id number]):   Left: IDC, grade 2, 3.0cm, with intermediate grade DCIS, 2 left axillary lymph nodes negative.  Margins positive. Right: No residual carcinoma.  10/03/2019: Resection of margins ([name]) ([id number]): negative for malignancy.   09/20/2019 Initial Diagnosis   Malignant neoplasm of central portion of right breast in female, estrogen receptor positive (HCC)   11/06/2019 - 12/01/2019 Radiation Therapy   The patient initially received a dose of 40.05 Gy in 15 fractions to the RIGHT breast and 40.05 Gy in 15 fractions to the LEFT breast using whole-breast tangent fields. This was delivered using a 3-D conformal technique. The pt received a boost to the LEFT breast delivering an additional 10 Gy in 5 fractions using a electron boost with electrons. The total dose was 50.05 Gy.      CHIEF COMPLIANT: Follow-up on anastrozole therapy  HISTORY OF PRESENT ILLNESS:   History of  Present Illness The patient, a breast cancer survivor, has been on anastrozole for the past five years. She reports muscle weakness and occasional tremors, which she initially attributed to the medication. The patient also mentioned a recent  benign knot removal from her wrist. She has been advised by her primary care physician to increase her water intake.     ALLERGIES:  is allergic to penicillins.  MEDICATIONS:  Current Outpatient Medications  Medication Sig Dispense Refill   anastrozole (ARIMIDEX) 1 MG tablet Take 1 tablet (1 mg total) by mouth daily. 90 tablet 1   aspirin EC 81 MG tablet Take 81 mg by mouth daily.     lisinopril-hydrochlorothiazide (ZESTORETIC) 20-25 MG tablet Take 1 tablet by mouth daily. 90 tablet 3   MIEBO 1.338 GM/ML SOLN Apply 1 drop to eye daily at 6 (six) AM.     Multiple Vitamin (MULTIVITAMIN) tablet Take 1 tablet by mouth daily.     primidone (MYSOLINE) 50 MG tablet 1 TABLET IN THE AM AND 2 TABLETS AT NIGHT 270 tablet 1   simvastatin (ZOCOR) 20 MG tablet TAKE 1 TABLET BY MOUTH EVERYDAY AT BEDTIME 90 tablet 3   No current facility-administered medications for this visit.    PHYSICAL EXAMINATION: ECOG PERFORMANCE STATUS: 1 - Symptomatic but completely ambulatory  Vitals:   07/06/23 1155  BP: (!) 156/66  Pulse: 61  Resp: 17  Temp: 97.8 F (36.6 C)  SpO2: 100%   Filed Weights   07/06/23 1155  Weight: 126 lb 8 oz (57.4 kg)      LABORATORY DATA:  I have reviewed the data as listed    Latest Ref Rng & Units 01/21/2022   11:15 AM 02/17/2021    9:46 AM 02/04/2021   10:38 AM  CMP  Glucose 70 - 99 mg/dL 409  811  914   BUN 8 - 27 mg/dL 9  13  14    Creatinine 0.57 - 1.00 mg/dL 7.82  9.56  2.13   Sodium 134 - 144 mmol/L 138  140  136   Potassium 3.5 - 5.2 mmol/L 3.3  3.7  3.3   Chloride 96 - 106 mmol/L 98  99  96   CO2 20 - 29 mmol/L 26  28  26    Calcium 8.7 - 10.3 mg/dL 9.3  9.5  9.3   Total Protein 6.0 - 8.5 g/dL 6.9     Total Bilirubin 0.0 - 1.2 mg/dL 0.2     Alkaline Phos 44 - 121 IU/L 110     AST 0 - 40 IU/L 19     ALT 0 - 32 IU/L 9       Lab Results  Component Value Date   WBC 4.5 01/21/2022   HGB 10.7 (L) 01/21/2022   HCT 32.1 (L) 01/21/2022   MCV 85 01/21/2022    PLT 155 01/21/2022   NEUTROABS 3.5 01/21/2022    ASSESSMENT & PLAN:  Malignant neoplasm of lower-outer quadrant of left breast of female, estrogen receptor positive (HCC) 01/31/2019:Palpable lump in the left breast x4 months mammogram 01/27/2019: 2.5 cm mass at 6 o'clock position, 0.7 cm mass retroareolar, axilla negative: Grade 2 IDC ER 100%, PR 100%, HER-2 negative, Ki-67 15%  T2N0 stage IB 03/10/2019: Biopsy of the right breast: DCIS, ER 80%, PR 40%   Recommendations: 1. Neoadjuvant antiestrogen therapy anastrozole 1 mg daily 02/17/2019 2. Breast conserving surgery 09/05/2019 3. Adjuvant radiation 11/07/2019-12/01/2019 4.  Continuation of adjuvant antiestrogen therapy --------------------------------------------------------------------------------------------------------------------------- Current treatment: Started as neoadjuvant therapy  with anastrozole 1 mg daily started 02/17/2019, now on adjuvant anastrozole Anastrozole toxicities: Tolerating it well   She will complete 5 years of anastrozole October 2025 at that time she will discontinue it.   09/05/2019:Left lumpectomy ([name]): IDC, grade 2, 3.0cm, with intermediate grade DCIS, 2 left axillary lymph nodes negative.  Invasive carcinoma present posterior margin focally, inferior margin focally, anterior superior margin broadly and anterior margin broadly. Right lumpectomy: Benign 10/03/2019: Resection of the margins: Negative for malignancy.    Breast cancer surveillance: 1. 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  Patient wants to see Dr. Luisa Hart for resection of the right medial breast epidermoid cyst. 2. 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 - Continue anastrozole until October 2025. - Ensure completion of annual mammograms. - Provide refills for anastrozole to last until October 2025.  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 - Continue anastrozole until October 2025. - Ensure completion of annual mammograms. - Provide refills for anastrozole to last until October 2025.  Essential tremor Intermittent tremors identified as essential tremor, unrelated to anastrozole.  Follow-up Nearing end of five-year treatment plan for breast cancer, transitioning off anastrozole by October 2025. - Schedule next mammogram for July 12, 2023. - Plan to discontinue anastrozole in October 2025. - Follow up as needed after completion of treatment.      No orders of the defined types were placed in this encounter.  The patient has a good understanding of the overall plan. she agrees with it. she will call with any problems that may develop before the next visit here. Total time spent: 30 mins including face to face time and time spent for planning, charting and co-ordination of care   Tamsen Meek, MD 07/06/23

## 2023-07-12 ENCOUNTER — Ambulatory Visit
Admission: RE | Admit: 2023-07-12 | Discharge: 2023-07-12 | Disposition: A | Discharge status: Home / Self Care | Payer: Medicare HMO | Source: Ambulatory Visit | Attending: Hematology and Oncology

## 2023-07-12 DIAGNOSIS — Z9889 Other specified postprocedural states: Secondary | ICD-10-CM

## 2023-07-22 ENCOUNTER — Telehealth: Payer: Self-pay | Admitting: Adult Health

## 2023-07-22 ENCOUNTER — Encounter: Payer: Self-pay | Admitting: Adult Health

## 2023-07-22 ENCOUNTER — Ambulatory Visit: Payer: Medicare HMO | Admitting: Adult Health

## 2023-07-22 VITALS — BP 118/59 | HR 66 | Ht 64.0 in | Wt 124.6 lb

## 2023-07-22 DIAGNOSIS — Z87898 Personal history of other specified conditions: Secondary | ICD-10-CM | POA: Diagnosis not present

## 2023-07-22 DIAGNOSIS — R251 Tremor, unspecified: Secondary | ICD-10-CM | POA: Diagnosis not present

## 2023-07-22 MED ORDER — PRIMIDONE 50 MG PO TABS: ORAL_TABLET | ORAL | Status: DC

## 2023-07-22 NOTE — Patient Instructions (Signed)
 Your Plan:  Continue Primidone 100 mg in the AM and 50 mg at bedtime Consider using wrist weights  If your symptoms worsen or you develop new symptoms please let us know.   Thank you for coming to see [place] at [facility]. I hope we have been able to provide you high quality care today.  You may receive a patient satisfaction survey over the next few weeks. We would appreciate your feedback and comments so that we may continue to improve ourselves and the health of our patients.

## 2023-07-22 NOTE — Telephone Encounter (Signed)
 Pt said NP advised to call back with information. Megan wanted know on bottle for primidone (MYSOLINE) 50 MG tablet if wants on the bottle is the same as she has. I have been taking 2 tablet morning and 1 tablet at night. Does Aundra Millet want me to take the 1 tablet in the morning. Would like a call from the nurse

## 2023-07-22 NOTE — Progress Notes (Signed)
 PATIENT: Robin Anderson DOB: 02/28/1941  REASON FOR VISIT: follow up HISTORY FROM: patient PRIMARY NEUROLOGIST: Dr. Frances Furbish  Chief Complaint  Patient presents with   Room 19    Pt is here Alone. Pt states that she has been doing well.      HISTORY OF PRESENT ILLNESS: Today 07/22/23:  Robin Anderson is a 83 y.o. female with a history of essential tremor and history of seizure. Returns today for follow-up.  Overall she feels that she has been doing well.  Remains on Mysoline but reports that she is taking 100 mg in the morning and 50 mg at bedtime.  The tremor primarily affects the right hand.  This is her dominant hand.  She has a hard time writing.  She did get a wrist weight and does feel that it is helpful.  Denies any seizure-like episodes.  She returns today for an evaluation.    (copied from Dr. Teofilo Pod note) 07/23/2022: She reports feeling stable with regards to her hand tremor.  She takes Mysoline 1 pill in the morning and 2 at night, she would like to stay with this regimen, denies any side effects.  Denies any recent falls or seizures.  She does not drive.  Of note, she no longer takes Keppra.  She hydrates well with water and drinks 1 cup of coffee in the morning.  She drinks no alcohol.  She estimates that she drinks about 3 bottles of water per day.  Sometimes she has cramping in her left hand, denies any neck pain.   The patient's allergies, current medications, family history, past medical history, past social history, past surgical history and problem list were reviewed and updated as appropriate.     01/21/22:Robin Anderson is a 83 year old female with a history of tremors and seizures.  She returns today for follow-up.  She remains on primidone for tremor.  Currently taking 50 mg in the morning and 100 mg at bedtime. she feels that the tremor has gotten worse in the right hand.  Notices some tremor with food and handwriting. No trouble with buttons. Lives at home with  her husband. No longer drives.  Denies any seizure-like events.  01/20/21: Robin Anderson is a 83 y.o. female with a history of tremors. She is here today for a follow up. Today patient expresses increased tremor in right hand compared to her left. She denies difficulty getting dressed or having gait imbalances and falls. However she does endorse some minor difficulties with feeding and handwriting. She denies excess sleepiness or fatigue. She reports that she has been taking the Primidone 50 MG in the morning, at noon, and in the evening. Since taking the medication this way she expresses that her tremors have not improved and the right hand tremor has been exacerbated. She denies any seizure activity at this time. She has no other complaints at this time.  HISTORY  01/17/20: Robin Anderson is a 83 year old female with a history of seizure disorder and essential tremor. She returns today for follow-up. Denies any seizures. Continues on primidone 50 mg 3 tablets at bedtime. Reports that she notices the tremor with handwriting. Feels that it might be worse. She had breast cancer had surgery in May and again in June. She is having swelling in the left breast. Will follow-up with surgeon on Monday. She returns today for follow-up.   07/12/19  Robin Anderson is a 83 year old female with history of seizure disorder and tremor in her right  hand. She has previously been on Keppra, but stopped due to report of moles on her face and scalp. She remains on primidone for seizure prevention and tremor. Her last seizure occurred in 2018 while taking Keppra 250 mg in the morning, 500 mg in the evening. The seizure episode was described as having woken up in the morning, had bitten her lip. She is taking primidone 150 mg at bedtime. She has breast cancer, is on oral chemo.  She has not had recurrent seizure.  The tremor is improved with primidone in the right hand, it may increase during times of anxiety. When she takes the primidone at  night, she may have a chalky taste in her mouth.  She does not drive a car.  She presents today for follow-up unaccompanied.    REVIEW OF SYSTEMS: Out of a complete 14 system review of symptoms, the patient complains only of the following symptoms, and all other reviewed systems are negative. Tremor in right hand.   ALLERGIES: Allergies  Allergen Reactions   Penicillins Rash    HOME MEDICATIONS: Outpatient Medications Prior to Visit  Medication Sig Dispense Refill   anastrozole (ARIMIDEX) 1 MG tablet Take 1 tablet (1 mg total) by mouth daily. 90 tablet 1   aspirin EC 81 MG tablet Take 81 mg by mouth daily.     lisinopril-hydrochlorothiazide (ZESTORETIC) 20-25 MG tablet Take 1 tablet by mouth daily. 90 tablet 3   Multiple Vitamin (MULTIVITAMIN) tablet Take 1 tablet by mouth daily.     primidone (MYSOLINE) 50 MG tablet 1 TABLET IN THE AM AND 2 TABLETS AT NIGHT 270 tablet 1   simvastatin (ZOCOR) 20 MG tablet TAKE 1 TABLET BY MOUTH EVERYDAY AT BEDTIME 90 tablet 3   MIEBO 1.338 GM/ML SOLN Apply 1 drop to eye daily at 6 (six) AM. (Patient not taking: Reported on 07/22/2023)     No facility-administered medications prior to visit.    PAST MEDICAL HISTORY: Past Medical History:  Diagnosis Date   Blood in stool    Cancer (HCC)    breast - left   Cataracts, bilateral    Chest pain, unspecified    Family history of breast cancer    Headache 07/02/2014   Heart murmur    Hepatitis    History of colon polyps    HLD (hyperlipidemia)    HTN (hypertension)    Prediabetes 07/28/2011   no meds, diet controlled   Seizure disorder (HCC)    seizures controlled with med   Wears glasses     PAST SURGICAL HISTORY: Past Surgical History:  Procedure Laterality Date   ABDOMINAL HERNIA REPAIR     lower mid line hernia which has reherniatied   BREAST BIOPSY Left 01/31/2019   Malignant   BREAST LUMPECTOMY Left 09/05/2019   re-excision 10/03/19   BREAST LUMPECTOMY WITH RADIOACTIVE SEED AND  SENTINEL LYMPH NODE BIOPSY N/A 09/05/2019   Procedure: RIGHT BREAST SEED LUMPECTOMY X 1, LEFT BREAST SEED LUMPECTOMY X 2, LEFT AXILLARY SENTINEL LYMPH NODE BIOPSY;  Surgeon: Harriette Bouillon, MD;  Location: [facility] OR;  Service: General;  Laterality: N/A;   BUNIONECTOMY Right    traumatic injury to her foot that was repaired   RE-EXCISION OF BREAST LUMPECTOMY Left 10/03/2019   Procedure: LEFT BREAST RE-EXCISION OF BREAST LUMPECTOMY;  Surgeon: Harriette Bouillon, MD;  Location: [facility] OR;  Service: General;  Laterality: Left;   TONSILLECTOMY     TUBAL LIGATION      FAMILY HISTORY: Family History  Problem Relation  Age of Onset   Hypertension Mother    Heart attack Father    Hypertension Sister        5 sisters....3 have HTN   Breast cancer Sister    Hypertension Brother    Heart attack Daughter    Brain cancer Son    Tremor Neg Hx     SOCIAL HISTORY: Social History   Socioeconomic History   Marital status: Married    Spouse name: Leonette Most   Number of children: 6   Years of education: 12   Highest education level: Not on file  Occupational History   Occupation: retired  Tobacco Use   Smoking status: Former    Types: Cigarettes   Smokeless tobacco: Never   Tobacco comments:    when she was young for a short period of time  Advertising account planner   Vaping status: Never Used  Substance and Sexual Activity   Alcohol use: No    Alcohol/week: 0.0 standard drinks of alcohol   Drug use: No   Sexual activity: Not on file  Other Topics Concern   Not on file  Social History Narrative   Patient lives at home with her husband Laterria [place]ota.   Retired.   Education high school.   Right handed..      Regular exercise-yes   Caffeine Use-yes, tea    Social Drivers of Corporate investment banker Strain: Not on file  Food Insecurity: Not on file  Transportation Needs: No Transportation Needs (02/14/2019)   PRAPARE - Administrator, Civil Service (Medical): No    Lack of Transportation  (Non-Medical): No  Physical Activity: Not on file  Stress: Not on file  Social Connections: Not on file  Intimate Partner Violence: Not At Risk (02/14/2019)   Humiliation, Afraid, Rape, and Kick questionnaire    Fear of Current or Ex-Partner: No    Emotionally Abused: No    Physically Abused: No    Sexually Abused: No      PHYSICAL EXAM  Vitals:   07/22/23 1054  BP: (!) 118/59  Pulse: 66  Weight: 124 lb 9.6 oz (56.5 kg)  Height: 5\' 4"  (1.626 m)     Body mass index is 21.39 kg/m.  Generalized: Well developed, in no acute distress   Neurological examination  Mentation: Alert oriented to time, place, history taking. Follows all commands speech and language fluent Cranial nerve II-XII: Pupils were equal round reactive to light. Extraocular movements were full, visual field were full on confrontational test. Facial sensation and strength were normal. Uvula tongue midline. Head turning and shoulder shrug  were normal and symmetric. Motor: The motor testing reveals 5 over 5 strength of all 4 extremities. Good symmetric motor tone is noted throughout.  Sensory: Sensory testing is intact to soft touch on all 4 extremities. No evidence of extinction is noted.  Coordination: Cerebellar testing reveals action tremor in the right on finger-nose-finger. Normal heel to shin. Spiral test was positive for essential tremor R > L.  Gait and station: Gait is normal.    DIAGNOSTIC DATA (LABS, IMAGING, TESTING) - I reviewed patient records, labs, notes, testing and imaging myself where available.  Lab Results  Component Value Date   WBC 4.5 01/21/2022   HGB 10.7 (L) 01/21/2022   HCT 32.1 (L) 01/21/2022   MCV 85 01/21/2022   PLT 155 01/21/2022      Component Value Date/Time   NA 138 01/21/2022 1115   K 3.3 (L) 01/21/2022 1115  CL 98 01/21/2022 1115   CO2 26 01/21/2022 1115   GLUCOSE 110 (H) 01/21/2022 1115   GLUCOSE 110 (H) 10/03/2019 0612   BUN 9 01/21/2022 1115   CREATININE  1.03 (H) 01/21/2022 1115   CREATININE 0.95 (H) 09/11/2015 0944   CALCIUM 9.3 01/21/2022 1115   PROT 6.9 01/21/2022 1115   ALBUMIN 4.2 01/21/2022 1115   AST 19 01/21/2022 1115   ALT 9 01/21/2022 1115   ALKPHOS 110 01/21/2022 1115   BILITOT 0.2 01/21/2022 1115   GFRNONAA 58 (L) 10/17/2019 1221   GFRAA 66 10/17/2019 1221   Lab Results  Component Value Date   CHOL 172 04/11/2019   HDL 79 04/11/2019   LDLCALC 81 04/11/2019   TRIG 59 04/11/2019   CHOLHDL 2.2 04/11/2019   Lab Results  Component Value Date   HGBA1C 5.8 08/10/2012   No results found for: "VITAMINB12" Lab Results  Component Value Date   TSH 1.880 09/01/2017      ASSESSMENT AND PLAN 83 y.o. year old female  has a past medical history of Blood in stool, Cancer (HCC), Cataracts, bilateral, Chest pain, unspecified, Family history of breast cancer, Headache (07/02/2014), Heart murmur, Hepatitis, History of colon polyps, HLD (hyperlipidemia), HTN (hypertension), Prediabetes (07/28/2011), Seizure disorder (HCC), and Wears glasses. here with   1: Essential Tremor  -Continue Primidone 100 mg in the morning and 50 mg at bedtime. -She was taking 50 mg in the morning and 100 mg at night.  But she switched it to 100 mg in the morning and 50 mg at bedtime.  She is going to verify this when she gets home.  I went ahead and changed her prescription to reflect that she is taking 100 mg in the morning and 50 mg at bedtime -Encouraged her to use the wrist weights   2: Seizures - No seizures at this time, continue to monitor for seizure activity.  FU in 6 months or sooner if needed  Butch Penny, MSN, NP-C 07/22/2023, 10:53 AM [facility] [address], [address] [place], [place] [address] [phone]

## 2023-07-22 NOTE — Telephone Encounter (Signed)
 Called pt and was unable to LVM due to voice mail not being setup.   Will call pt back

## 2023-07-23 NOTE — Telephone Encounter (Signed)
 Returned call to pt and she said megan wanted to know what she had been taking and what the bottle in her possession was written for. Pt stated that primidone stated take she had been taking 2 in the am and 1 at noc. The primidone bottle in her possession states: take 1 tab in am and 2 tabs at noc at the 50mg . I told the pt that I would let megan know and she voiced gratitude and understanding.

## 2023-07-26 NOTE — Telephone Encounter (Signed)
 I called and she is taking 2 (50mg  tabs) in am and 1 (50mg  tab) in the evening,  she is doing ok.  We are all on the same page.  She appreciated call back.

## 2023-07-26 NOTE — Telephone Encounter (Signed)
 Noted I have updated her meds to reflect that she is taking 2 tablets in the morning and 1 tablet at night

## 2023-09-20 ENCOUNTER — Other Ambulatory Visit: Payer: Self-pay

## 2023-09-20 MED ORDER — SIMVASTATIN 20 MG PO TABS
ORAL_TABLET | ORAL | 0 refills | Status: AC
Start: 2023-09-20 — End: ?

## 2023-09-23 ENCOUNTER — Other Ambulatory Visit: Payer: Self-pay | Admitting: Internal Medicine

## 2023-09-23 DIAGNOSIS — Z79899 Other long term (current) drug therapy: Secondary | ICD-10-CM

## 2023-09-23 DIAGNOSIS — I10 Essential (primary) hypertension: Secondary | ICD-10-CM

## 2023-10-12 ENCOUNTER — Other Ambulatory Visit: Payer: Self-pay | Admitting: Physician Assistant

## 2023-11-03 ENCOUNTER — Other Ambulatory Visit: Payer: Self-pay

## 2023-11-16 ENCOUNTER — Ambulatory Visit: Attending: Internal Medicine | Admitting: Cardiology

## 2023-11-16 ENCOUNTER — Encounter: Payer: Self-pay | Admitting: Cardiology

## 2023-11-16 VITALS — BP 102/48 | HR 61 | Resp 16 | Ht 64.0 in | Wt 124.8 lb

## 2023-11-16 DIAGNOSIS — Z170 Estrogen receptor positive status [ER+]: Secondary | ICD-10-CM

## 2023-11-16 DIAGNOSIS — E782 Mixed hyperlipidemia: Secondary | ICD-10-CM

## 2023-11-16 DIAGNOSIS — I10 Essential (primary) hypertension: Secondary | ICD-10-CM

## 2023-11-16 DIAGNOSIS — C50512 Malignant neoplasm of lower-outer quadrant of left female breast: Secondary | ICD-10-CM | POA: Diagnosis not present

## 2023-11-16 NOTE — Progress Notes (Signed)
 Cardiology Office Note:  .   Date:  11/16/2023  ID:  Zelda Girard Daring, Cooper 08-21-1940, MRN [id number] PCP:  Leontine Cramp, NP  Former Cardiology Providers: Powell Sorrow MD [facility] Providers Cardiologist:  Madonna Large, DO , [facility] (established care 11/16/23) Electrophysiologist:  None  Click to update primary MD,subspecialty MD or APP then REFRESH:1}    Chief Complaint  Patient presents with   Hypertension   Follow-up    Hx of breast cancer    History of Present Illness: .   [facility] Robin Anderson is a 83 y.o. African-American female whose past medical history and cardiovascular risk factors includes: Hypertension, hyperlipidemia, history of breast cancer now in remission but s/p lumpectomy and XRT, seizure disorder.   Formally under the care of Dr. Powell Sorrow who last saw [facility] back in April 15, 2022. I am seeing her for the first time to re-establishing care.   Patient was referred to cardiology given her history of breast cancer status post left lumpectomy and 4 weeks of XRT therapy.  Her echocardiogram in June 2022 noted hyperdynamic LVEF at 70 to 75%, GLS -26.4%, normal diastolic function, and no significant valvular heart disease.  Patient was placed on anastrozole .  She was seen by Orren Fabry, PA-C in June 2024 lower extremity duplex was performed for evaluation of DVT and echocardiogram prior to next office visit was recommended.  Since last office visit patient denies any anginal chest pain or heart failure symptoms.  No hospitalizations or urgent care visits for cardiovascular reasons.  She has been compliant with her medical therapy.  No significant weight gain. No structured exercise program or daily routine. Does not check her BP at home but no lightheaded or dizziness or near syncope / syncope. No more leg swelling.   Review of Systems: .   Review of Systems  Cardiovascular:  Negative for chest pain, claudication, irregular heartbeat,  leg swelling, near-syncope, orthopnea, palpitations, paroxysmal nocturnal dyspnea and syncope.  Respiratory:  Negative for shortness of breath.   Hematologic/Lymphatic: Negative for bleeding problem.    Studies Reviewed:   EKG: EKG Interpretation Date/Time:  Tuesday November 16 2023 10:03:31 EDT Ventricular Rate:  67 PR Interval:  148 QRS Duration:  68 QT Interval:  404 QTC Calculation: 426 R Axis:   36  Text Interpretation: Normal sinus rhythm Normal ECG When compared with ECG of 21-Sep-2002 19:38, Sinus rhythm has replaced Sinus bradycardia Confirmed by Large Madonna [phone]) on 11/16/2023 10:10:55 AM  Echocardiogram: 10/2020  1. Left ventricular ejection fraction, by estimation, is 70 to 75%. Left  ventricular ejection fraction by 3D volume is 78 %. The left ventricle has  hyperdynamic function. The left ventricle has no regional wall motion  abnormalities. Left ventricular  diastolic parameters were normal. The average left ventricular global  longitudinal strain is 26.4 %. The global longitudinal strain is normal.   2. Right ventricular systolic function is hyperdynamic. The right  ventricular size is normal. There is normal pulmonary artery systolic  pressure.   3. The mitral valve is normal in structure. No evidence of mitral valve  regurgitation. No evidence of mitral stenosis.   4. The aortic valve is tricuspid. Aortic valve regurgitation is not  visualized. No aortic stenosis is present.   5. The inferior vena cava is normal in size with greater than 50%  respiratory variability, suggesting right atrial pressure of 3 mmHg.   Conclusion(s)/Recommendation(s): Murmur is probably related to high  cardiac output state (consider anemia, thyrotoxicosis,  infection, etc).   RADIOLOGY: N/A  Risk Assessment/Calculations:   NA   Labs:       Latest Ref Rng & Units 01/21/2022   11:15 AM 10/03/2019    6:12 AM 09/01/2019   10:00 AM  CBC  WBC 3.4 - 10.8 x10E3/uL 4.5  6.5  7.1    Hemoglobin 11.1 - 15.9 g/dL 89.2  87.5  87.9   Hematocrit 34.0 - 46.6 % 32.1  38.9  38.7   Platelets 150 - 450 x10E3/uL 155  220  214        Latest Ref Rng & Units 01/21/2022   11:15 AM 02/17/2021    9:46 AM 02/04/2021   10:38 AM  BMP  Glucose 70 - 99 mg/dL 889  868  830   BUN 8 - 27 mg/dL 9  13  14    Creatinine 0.57 - 1.00 mg/dL 8.96  8.99  8.93   BUN/Creat Ratio 12 - 28 9  13  13    Sodium 134 - 144 mmol/L 138  140  136   Potassium 3.5 - 5.2 mmol/L 3.3  3.7  3.3   Chloride 96 - 106 mmol/L 98  99  96   CO2 20 - 29 mmol/L 26  28  26    Calcium 8.7 - 10.3 mg/dL 9.3  9.5  9.3       Latest Ref Rng & Units 01/21/2022   11:15 AM 02/17/2021    9:46 AM 02/04/2021   10:38 AM  CMP  Glucose 70 - 99 mg/dL 889  868  830   BUN 8 - 27 mg/dL 9  13  14    Creatinine 0.57 - 1.00 mg/dL 8.96  8.99  8.93   Sodium 134 - 144 mmol/L 138  140  136   Potassium 3.5 - 5.2 mmol/L 3.3  3.7  3.3   Chloride 96 - 106 mmol/L 98  99  96   CO2 20 - 29 mmol/L 26  28  26    Calcium 8.7 - 10.3 mg/dL 9.3  9.5  9.3   Total Protein 6.0 - 8.5 g/dL 6.9     Total Bilirubin 0.0 - 1.2 mg/dL 0.2     Alkaline Phos 44 - 121 IU/L 110     AST 0 - 40 IU/L 19     ALT 0 - 32 IU/L 9       Lab Results  Component Value Date   CHOL 172 04/11/2019   HDL 79 04/11/2019   LDLCALC 81 04/11/2019   TRIG 59 04/11/2019   CHOLHDL 2.2 04/11/2019   No results for input(s): LIPOA in the last 8760 hours. No components found for: NTPROBNP No results for input(s): PROBNP in the last 8760 hours. No results for input(s): TSH in the last 8760 hours.  Physical Exam:    Today's Vitals   11/16/23 0947  BP: (!) 102/48  Pulse: 61  Resp: 16  SpO2: 99%  Weight: 124 lb 12.8 oz (56.6 kg)  Height: 5' 4 (1.626 m)   Body mass index is 21.42 kg/m. Wt Readings from Last 3 Encounters:  11/16/23 124 lb 12.8 oz (56.6 kg)  07/22/23 124 lb 9.6 oz (56.5 kg)  07/06/23 126 lb 8 oz (57.4 kg)    Physical Exam  Constitutional: No  distress.  hemodynamically stable  Neck: No JVD present.  Cardiovascular: Normal rate, regular rhythm, S1 normal and S2 normal. Exam reveals no gallop, no S3 and no S4.  No murmur heard. Pulmonary/Chest: Effort normal  and breath sounds normal. No stridor. She has no wheezes. She has no rales.  Musculoskeletal:        General: No edema.     Cervical back: Neck supple.  Skin: Skin is warm.     Impression & Recommendation(s):  Impression:   ICD-10-CM   1. Essential hypertension  I10 EKG 12-Lead    ECHOCARDIOGRAM COMPLETE    2. Mixed hyperlipidemia  E78.2     3. Malignant neoplasm of lower-outer quadrant of left breast of female, estrogen receptor positive (HCC)  C50.512    Z17.0        Recommendation(s):  Essential hypertension Office blood pressures are soft. She does not check her blood pressures at home. Currently on lisinopril /hydrochlorothiazide  20/25 mg p.o. daily. I have asked her to reach out to her primary care provider who is managing her blood pressures and consider splitting them into 2 separate pills such that she takes the hydrochlorothiazide  component in the morning and lisinopril  at night. This should help her overall blood pressure readings. Patient is aware to be cognizant of feeling tired, fatigued, lightheaded, dizziness as these are symptoms of soft blood pressures.  Given her advanced age would like to help her prevent falls. Patient's husband states that they will call her PCP after today's appointment  Mixed hyperlipidemia Currently on simvastatin  20 mg p.o. daily. Cardiology following peripherally, managed by primary care provider.  Malignant neoplasm of lower-outer quadrant of left breast of female, estrogen receptor positive (HCC) Remains on anastrozole  1 mg p.o. daily. Will order an echocardiogram to evaluate for LVEF, strain given her history of breast cancer.  This can be done prior to the next office visit.  Medical decision making: Discussed  management of at least 2 chronic comorbid conditions. Reviewed the last progress note from Dr. Hobart December 2023, prescription drug management, EKG 11/16/2023 independently reviewed, coordination of care.  Orders Placed:  Orders Placed This Encounter  Procedures   EKG 12-Lead   ECHOCARDIOGRAM COMPLETE    Strain imaging    Standing Status:   Future    Expected Date:   11/23/2023    Expiration Date:   11/15/2024    Perflutren DEFINITY (image enhancing agent) should be administered unless hypersensitivity or allergy exist:   Administer Perflutren    Reason for exam-Echo:   Other-Full Diagnosis List    Full ICD-10/Reason for Exam:   HTN (hypertension) [761881]    Other Comments:   Strain imaging    Where [facility] this test [facility] performed[facility]    Does the patient weigh less than or greater than 250 lbs?:   Patient weighs less than 250 lbs   Final Medication List:   No orders of the defined types were placed in this encounter.   Medications Discontinued During This Encounter  Medication Reason   MIEBO 1.338 GM/ML SOLN No longer needed (for PRN medications)     Current Outpatient Medications:    anastrozole  (ARIMIDEX ) 1 MG tablet, Take 1 tablet (1 mg total) by mouth daily., Disp: 90 tablet, Rfl: 1   aspirin  EC 81 MG tablet, Take 81 mg by mouth daily., Disp: , Rfl:    lisinopril -hydrochlorothiazide  (ZESTORETIC ) 20-25 MG tablet, TAKE 1 TABLET BY MOUTH EVERY DAY, Disp: 30 tablet, Rfl: 0   Multiple Vitamin (MULTIVITAMIN) tablet, Take 1 tablet by mouth daily., Disp: , Rfl:    primidone  (MYSOLINE ) 50 MG tablet, Take 2 tablets in the AM and 1 tablet in the PM, Disp: ,  Rfl:    simvastatin  (ZOCOR ) 20 MG tablet, TAKE 1 TABLET BY MOUTH EVERYDAY AT BEDTIME, Disp: 30 tablet, Rfl: 0  Consent:   NA  Disposition:   1 year follow-up sooner if needed  Her questions and concerns were addressed to her satisfaction. She voices understanding of the recommendations provided during this  encounter.    Signed, Madonna Michele HAS, [facility]  A Division of [facility] [address], [place], [place] [address]  [place], [place] [address] 11/16/2023 1:17 PM

## 2023-11-16 NOTE — Patient Instructions (Addendum)
 Medication Instructions:  No medication changes were made at this visit. Continue current regimen.   **Consider discussing with your primary care provider about splitting up your Hydrochlorothiazide  to the morning and Lisinopril  to the evening**  *If you need a refill on your cardiac medications before your next appointment, please call your pharmacy*  Lab Work: None ordered today. If you have labs (blood work) drawn today and your tests are completely normal, you will receive your results only by: MyChart Message (if you have MyChart) OR A paper copy in the mail If you have any lab test that is abnormal or we need to change your treatment, we will call you to review the results.  Testing/Procedures: Your physician has requested that you have an echocardiogram. Echocardiography is a painless test that uses sound waves to create images of your heart. It provides your doctor with information about the size and shape of your heart and how well your heart's chambers and valves are working. This procedure takes approximately one hour. There are no restrictions for this procedure. Please do NOT wear cologne, perfume, aftershave, or lotions (deodorant is allowed). Please arrive 15 minutes prior to your appointment time.  Please note: We ask at that you not bring children with you during ultrasound (echo/ vascular) testing. Due to room size and safety concerns, children are not allowed in the ultrasound rooms during exams. Our front office staff cannot provide observation of children in our lobby area while testing is being conducted. An adult accompanying a patient to their appointment will only be allowed in the ultrasound room at the discretion of the ultrasound technician under special circumstances. We apologize for any inconvenience.   Follow-Up: At [facility], you and your health needs are our priority.  As part of our continuing mission to provide you with exceptional heart care, our  providers are all part of one team.  This team includes your primary Cardiologist (physician) and Advanced Practice Providers or APPs (Physician Assistants and Nurse Practitioners) who all work together to provide you with the care you need, when you need it.  Your next appointment:   1 year(s)  Provider:   Madonna Large, DO    We recommend signing up for the patient portal called MyChart.  Sign up information is provided on this After Visit Summary.  MyChart is used to connect with patients for Virtual Visits (Telemedicine).  Patients are able to view lab/test results, encounter notes, upcoming appointments, etc.  Non-urgent messages can be sent to your provider as well.   To learn more about what you can do with MyChart, go to [email or web address].   Other Instructions **Consider discussing with your primary care provider about splitting up your Hydrochlorothiazide  to the morning and Lisinopril  to the evening**

## 2023-12-15 ENCOUNTER — Other Ambulatory Visit: Payer: Self-pay | Admitting: Neurology

## 2023-12-23 ENCOUNTER — Ambulatory Visit (HOSPITAL_COMMUNITY)
Admission: RE | Admit: 2023-12-23 | Discharge: 2023-12-23 | Disposition: A | Discharge status: Home / Self Care | Source: Ambulatory Visit | Attending: Cardiology | Admitting: Cardiology

## 2023-12-23 DIAGNOSIS — I10 Essential (primary) hypertension: Secondary | ICD-10-CM | POA: Diagnosis present

## 2023-12-23 LAB — ECHOCARDIOGRAM COMPLETE
AR max vel: 2.04 cm2
AV Area VTI: 2.04 cm2
AV Area mean vel: 1.82 cm2
AV Mean grad: 4 mmHg
AV Peak grad: 7.4 mmHg
Ao pk vel: 1.36 m/s
Area-P 1/2: 3.99 cm2
S' Lateral: 1.9 cm

## 2023-12-25 ENCOUNTER — Ambulatory Visit: Payer: Self-pay | Admitting: Cardiology

## 2024-02-21 ENCOUNTER — Telehealth: Payer: Self-pay | Admitting: Adult Health

## 2024-02-21 NOTE — Telephone Encounter (Signed)
Patient called to verify appointment 

## 2024-03-02 ENCOUNTER — Ambulatory Visit: Admitting: Adult Health

## 2024-03-02 DIAGNOSIS — Z79899 Other long term (current) drug therapy: Secondary | ICD-10-CM

## 2024-03-02 DIAGNOSIS — I10 Essential (primary) hypertension: Secondary | ICD-10-CM

## 2024-03-02 MED ORDER — PRIMIDONE 50 MG PO TABS: 50.0000 mg | ORAL_TABLET | Freq: Three times a day (TID) | ORAL | 4 refills | Status: AC

## 2024-03-02 NOTE — Progress Notes (Signed)
 PATIENT: Robin Anderson DOB: 1940/11/17  REASON FOR VISIT: follow up HISTORY FROM: patient PRIMARY NEUROLOGIST: Dr. Buck  Chief Complaint  Patient presents with   Follow-up    RM 4, alone.     HISTORY OF PRESENT ILLNESS: Today 03/02/24:  Robin Anderson is a 83 y.o. female with a history of essential tremor and history of seizure. Returns today for follow-up.  Overall she feels that her symptoms have remained relatively stable.  She does wear a copper brace on her right hand.  She does feel that the right hand is worse than the left.  She is currently taking Mysoline  50 mg in the morning and 100 at bedtime.  She has never tried taking it 3 times a day.  She states that the right hand bothers her the most.  Denies any seizure-like events returns today for an evaluation.     07/22/23: Robin Anderson is a 83 y.o. female with a history of essential tremor and history of seizure. Returns today for follow-up.  Overall she feels that she has been doing well.  Remains on Mysoline  but reports that she is taking 100 mg in the morning and 50 mg at bedtime.  The tremor primarily affects the right hand.  This is her dominant hand.  She has a hard time writing.  She did get a wrist weight and does feel that it is helpful.  Denies any seizure-like episodes.  She returns today for an evaluation.   (copied from Dr. Obie note) 07/23/2022: She reports feeling stable with regards to her hand tremor.  She takes Mysoline  1 pill in the morning and 2 at night, she would like to stay with this regimen, denies any side effects.  Denies any recent falls or seizures.  She does not drive.  Of note, she no longer takes Keppra .  She hydrates well with water  and drinks 1 cup of coffee in the morning.  She drinks no alcohol.  She estimates that she drinks about 3 bottles of water  per day.  Sometimes she has cramping in her left hand, denies any neck pain.   The patient's allergies, current medications,  family history, past medical history, past social history, past surgical history and problem list were reviewed and updated as appropriate.     01/21/22:Robin Anderson is a 83 year old female with a history of tremors and seizures.  She returns today for follow-up.  She remains on primidone  for tremor.  Currently taking 50 mg in the morning and 100 mg at bedtime. she feels that the tremor has gotten worse in the right hand.  Notices some tremor with food and handwriting. No trouble with buttons. Lives at home with her husband. No longer drives.  Denies any seizure-like events.  01/20/21: Robin Anderson is a 83 y.o. female with a history of tremors. She is here today for a follow up. Today patient expresses increased tremor in right hand compared to her left. She denies difficulty getting dressed or having gait imbalances and falls. However she does endorse some minor difficulties with feeding and handwriting. She denies excess sleepiness or fatigue. She reports that she has been taking the Primidone  50 MG in the morning, at noon, and in the evening. Since taking the medication this way she expresses that her tremors have not improved and the right hand tremor has been exacerbated. She denies any seizure activity at this time. She has no other complaints at this time.  HISTORY  01/17/20: Robin Anderson  is a 83 year old female with a history of seizure disorder and essential tremor. She returns today for follow-up. Denies any seizures. Continues on primidone  50 mg 3 tablets at bedtime. Reports that she notices the tremor with handwriting. Feels that it might be worse. She had breast cancer had surgery in May and again in June. She is having swelling in the left breast. Will follow-up with surgeon on Monday. She returns today for follow-up.   07/12/19  Robin Anderson is a 83 year old female with history of seizure disorder and tremor in her right hand. She has previously been on Keppra , but stopped due to report of moles on her  face and scalp. She remains on primidone  for seizure prevention and tremor. Her last seizure occurred in 2018 while taking Keppra  250 mg in the morning, 500 mg in the evening. The seizure episode was described as having woken up in the morning, had bitten her lip. She is taking primidone  150 mg at bedtime. She has breast cancer, is on oral chemo.  She has not had recurrent seizure.  The tremor is improved with primidone  in the right hand, it may increase during times of anxiety. When she takes the primidone  at night, she may have a chalky taste in her mouth.  She does not drive a car.  She presents today for follow-up unaccompanied.    REVIEW OF SYSTEMS: Out of a complete 14 system review of symptoms, the patient complains only of the following symptoms, and all other reviewed systems are negative. Tremor in right hand.   ALLERGIES: Allergies  Allergen Reactions   Penicillins Rash    HOME MEDICATIONS: Outpatient Medications Prior to Visit  Medication Sig Dispense Refill   lisinopril -hydrochlorothiazide  (ZESTORETIC ) 20-25 MG tablet TAKE 1 TABLET BY MOUTH EVERY DAY (Patient taking differently: Take 1 tablet by mouth daily. 1/2 tab in the morning, 1 tab in evening) 30 tablet 0   anastrozole  (ARIMIDEX ) 1 MG tablet Take 1 tablet (1 mg total) by mouth daily. 90 tablet 1   aspirin  EC 81 MG tablet Take 81 mg by mouth daily.     Multiple Vitamin (MULTIVITAMIN) tablet Take 1 tablet by mouth daily.     primidone  (MYSOLINE ) 50 MG tablet 1 TABLET IN THE AM AND 2 TABLETS AT NIGHT 270 tablet 1   simvastatin  (ZOCOR ) 20 MG tablet TAKE 1 TABLET BY MOUTH EVERYDAY AT BEDTIME 30 tablet 0   No facility-administered medications prior to visit.    PAST MEDICAL HISTORY: Past Medical History:  Diagnosis Date   Blood in stool    Cancer (HCC)    breast - left   Cataracts, bilateral    Chest pain, unspecified    Family history of breast cancer    Headache 07/02/2014   Heart murmur    Hepatitis    History of  colon polyps    HLD (hyperlipidemia)    HTN (hypertension)    Prediabetes 07/28/2011   no meds, diet controlled   Seizure disorder (HCC)    seizures controlled with med   Wears glasses     PAST SURGICAL HISTORY: Past Surgical History:  Procedure Laterality Date   ABDOMINAL HERNIA REPAIR     lower mid line hernia which has reherniatied   BREAST BIOPSY Left 01/31/2019   Malignant   BREAST LUMPECTOMY Left 09/05/2019   re-excision 10/03/19   BREAST LUMPECTOMY N/A 2024   BREAST LUMPECTOMY WITH RADIOACTIVE SEED AND SENTINEL LYMPH NODE BIOPSY N/A 09/05/2019   Procedure: RIGHT BREAST SEED  LUMPECTOMY X 1, LEFT BREAST SEED LUMPECTOMY X 2, LEFT AXILLARY SENTINEL LYMPH NODE BIOPSY;  Surgeon: Vanderbilt Ned, MD;  Location: [facility] OR;  Service: General;  Laterality: N/A;   BUNIONECTOMY Right    traumatic injury to her foot that was repaired   RE-EXCISION OF BREAST LUMPECTOMY Left 10/03/2019   Procedure: LEFT BREAST RE-EXCISION OF BREAST LUMPECTOMY;  Surgeon: Vanderbilt Ned, MD;  Location: [facility] OR;  Service: General;  Laterality: Left;   TONSILLECTOMY     TUBAL LIGATION      FAMILY HISTORY: Family History  Problem Relation Age of Onset   Hypertension Mother    Heart attack Father    Hypertension Sister        5 sisters....3 have HTN   Breast cancer Sister    Hypertension Brother    Heart attack Daughter    Brain cancer Son    Tremor Neg Hx     SOCIAL HISTORY: Social History   Socioeconomic History   Marital status: Married    Spouse name: Carlin   Number of children: 6   Years of education: 12   Highest education level: Not on file  Occupational History   Occupation: retired  Tobacco Use   Smoking status: Former    Types: Cigarettes   Smokeless tobacco: Never   Tobacco comments:    when she was young for a short period of time  Advertising Account Planner   Vaping status: Never Used  Substance and Sexual Activity   Alcohol use: No    Alcohol/week: 0.0 standard drinks of alcohol   Drug  use: No   Sexual activity: Not on file  Other Topics Concern   Not on file  Social History Narrative   Patient lives at home with her husband Arnesha Schiraldi.   Retired.   Education high school.   Right handed..      Regular exercise-yes   Caffeine Use-yes, tea    Social Drivers of Corporate Investment Banker Strain: Not on file  Food Insecurity: Not on file  Transportation Needs: No Transportation Needs (02/14/2019)   PRAPARE - Administrator, Civil Service (Medical): No    Lack of Transportation (Non-Medical): No  Physical Activity: Not on file  Stress: Not on file  Social Connections: Not on file  Intimate Partner Violence: Not At Risk (02/14/2019)   Humiliation, Afraid, Rape, and Kick questionnaire    Fear of Current or Ex-Partner: No    Emotionally Abused: No    Physically Abused: No    Sexually Abused: No      PHYSICAL EXAM  Vitals:   03/02/24 1314  BP: (!) 156/79  Pulse: (!) 56  Weight: 119 lb 6.4 oz (54.2 kg)  Height: 5' 3 (1.6 m)      Body mass index is 21.15 kg/m.  Generalized: Well developed, in no acute distress   Neurological examination  Mentation: Alert oriented to time, place, history taking. Follows all commands speech and language fluent Cranial nerve II-XII: Pupils were equal round reactive to light. Extraocular movements were full, visual field were full on confrontational test. Facial sensation and strength were normal. Uvula tongue midline. Head turning and shoulder shrug  were normal and symmetric. Motor: The motor testing reveals 5 over 5 strength of all 4 extremities. Good symmetric motor tone is noted throughout.  Sensory: Sensory testing is intact to soft touch on all 4 extremities. No evidence of extinction is noted.  Coordination: Cerebellar testing reveals action tremor in the  right on finger-nose-finger. Normal heel to shin. Spiral test was positive for essential tremor R > L.  Intention tremor noted in the upper  extremities right greater than left Gait and station: Gait is normal.    DIAGNOSTIC DATA (LABS, IMAGING, TESTING) - I reviewed patient records, labs, notes, testing and imaging myself where available.  Lab Results  Component Value Date   WBC 4.5 01/21/2022   HGB 10.7 (L) 01/21/2022   HCT 32.1 (L) 01/21/2022   MCV 85 01/21/2022   PLT 155 01/21/2022      Component Value Date/Time   NA 138 01/21/2022 1115   K 3.3 (L) 01/21/2022 1115   CL 98 01/21/2022 1115   CO2 26 01/21/2022 1115   GLUCOSE 110 (H) 01/21/2022 1115   GLUCOSE 110 (H) 10/03/2019 0612   BUN 9 01/21/2022 1115   CREATININE 1.03 (H) 01/21/2022 1115   CREATININE 0.95 (H) 09/11/2015 0944   CALCIUM 9.3 01/21/2022 1115   PROT 6.9 01/21/2022 1115   ALBUMIN 4.2 01/21/2022 1115   AST 19 01/21/2022 1115   ALT 9 01/21/2022 1115   ALKPHOS 110 01/21/2022 1115   BILITOT 0.2 01/21/2022 1115   GFRNONAA 58 (L) 10/17/2019 1221   GFRAA 66 10/17/2019 1221   Lab Results  Component Value Date   CHOL 172 04/11/2019   HDL 79 04/11/2019   LDLCALC 81 04/11/2019   TRIG 59 04/11/2019   CHOLHDL 2.2 04/11/2019   Lab Results  Component Value Date   HGBA1C 5.8 08/10/2012   No results found for: [id number] Lab Results  Component Value Date   TSH 1.880 09/01/2017      ASSESSMENT AND PLAN 83 y.o. year old female  has a past medical history of Blood in stool, Cancer (HCC), Cataracts, bilateral, Chest pain, unspecified, Family history of breast cancer, Headache (07/02/2014), Heart murmur, Hepatitis, History of colon polyps, HLD (hyperlipidemia), HTN (hypertension), Prediabetes (07/28/2011), Seizure disorder (HCC), and Wears glasses. here with   1: Essential Tremor  - Advised that she could try taking primidone  50 mg 3 times a day. - Encouraged her to use the wrist weights  2: Seizures  - No seizures at this time, continue to monitor for seizure activity.  FU in 1 year or sooner if needed  Meds ordered this encounter   Medications   primidone  (MYSOLINE ) 50 MG tablet    Sig: Take 1 tablet (50 mg total) by mouth 3 (three) times daily.    Dispense:  270 tablet    Refill:  4    Supervising Provider:   YAN, YIJUN [[id number]]     Duwaine Russell, MSN, NP-C 03/02/2024, 4:09 PM [facility] [address] [place], [place] [address] [phone]  The patient's condition requires frequent monitoring and adjustments in the treatment plan, reflecting the ongoing complexity of care.  This provider is the continuing focal point for all needed services for this condition.

## 2024-03-02 NOTE — Patient Instructions (Signed)
 Your Plan:  Try taking Primidone  50 mg three times a day If your symptoms worsen or you develop new symptoms please let us  know.   Thank you for coming to see us  at [facility]. I hope we have been able to provide you high quality care today.  You may receive a patient satisfaction survey over the next few weeks. We would appreciate your feedback and comments so that we may continue to improve ourselves and the health of our patients.

## 2024-03-13 ENCOUNTER — Other Ambulatory Visit: Payer: Self-pay | Admitting: Hematology and Oncology

## 2024-03-13 DIAGNOSIS — Z170 Estrogen receptor positive status [ER+]: Secondary | ICD-10-CM

## 2024-05-01 ENCOUNTER — Other Ambulatory Visit: Payer: Self-pay | Admitting: Surgery

## 2024-05-01 DIAGNOSIS — N6324 Unspecified lump in the left breast, lower inner quadrant: Secondary | ICD-10-CM

## 2024-05-17 ENCOUNTER — Ambulatory Visit
Admission: RE | Admit: 2024-05-17 | Discharge: 2024-05-17 | Disposition: A | Source: Ambulatory Visit | Attending: Surgery | Admitting: Surgery

## 2024-05-17 DIAGNOSIS — N6324 Unspecified lump in the left breast, lower inner quadrant: Secondary | ICD-10-CM

## 2025-03-08 ENCOUNTER — Ambulatory Visit: Admitting: Adult Health
